# Patient Record
Sex: Male | Born: 1955 | Race: Black or African American | Hispanic: No | Marital: Single | Smoking: Current every day smoker
Health system: Southern US, Community
[De-identification: ages and names within clinical notes are randomized; demographics above are authoritative.]

## PROBLEM LIST (undated history)

## (undated) DIAGNOSIS — K219 Gastro-esophageal reflux disease without esophagitis: Secondary | ICD-10-CM

## (undated) DIAGNOSIS — N529 Male erectile dysfunction, unspecified: Secondary | ICD-10-CM

## (undated) DIAGNOSIS — G4730 Sleep apnea, unspecified: Secondary | ICD-10-CM

## (undated) DIAGNOSIS — Z8669 Personal history of other diseases of the nervous system and sense organs: Secondary | ICD-10-CM

## (undated) DIAGNOSIS — R413 Other amnesia: Secondary | ICD-10-CM

## (undated) DIAGNOSIS — B1920 Unspecified viral hepatitis C without hepatic coma: Secondary | ICD-10-CM

## (undated) DIAGNOSIS — Z720 Tobacco use: Secondary | ICD-10-CM

## (undated) DIAGNOSIS — R569 Unspecified convulsions: Secondary | ICD-10-CM

## (undated) DIAGNOSIS — R110 Nausea: Secondary | ICD-10-CM

## (undated) DIAGNOSIS — I10 Essential (primary) hypertension: Secondary | ICD-10-CM

## (undated) DIAGNOSIS — E119 Type 2 diabetes mellitus without complications: Secondary | ICD-10-CM

## (undated) DIAGNOSIS — Z87442 Personal history of urinary calculi: Secondary | ICD-10-CM

## (undated) DIAGNOSIS — I639 Cerebral infarction, unspecified: Secondary | ICD-10-CM

## (undated) DIAGNOSIS — R51 Headache: Secondary | ICD-10-CM

## (undated) HISTORY — DX: Unspecified convulsions: R56.9

## (undated) HISTORY — DX: Type 2 diabetes mellitus without complications: E11.9

## (undated) HISTORY — DX: Tobacco use: Z72.0

## (undated) HISTORY — DX: Unspecified viral hepatitis C without hepatic coma: B19.20

## (undated) HISTORY — DX: Cerebral infarction, unspecified: I63.9

## (undated) HISTORY — DX: Male erectile dysfunction, unspecified: N52.9

## (undated) HISTORY — DX: Other amnesia: R41.3

---

## 2006-10-16 ENCOUNTER — Emergency Department (HOSPITAL_COMMUNITY): Admission: EM | Admit: 2006-10-16 | Discharge: 2006-10-16 | Payer: Self-pay | Admitting: Specialist

## 2009-02-05 ENCOUNTER — Inpatient Hospital Stay (HOSPITAL_COMMUNITY): Admission: EM | Admit: 2009-02-05 | Discharge: 2009-02-13 | Payer: Self-pay | Admitting: Emergency Medicine

## 2009-02-05 ENCOUNTER — Ambulatory Visit: Payer: Self-pay | Admitting: Cardiology

## 2009-02-06 ENCOUNTER — Encounter: Payer: Self-pay | Admitting: Internal Medicine

## 2009-02-07 ENCOUNTER — Encounter (INDEPENDENT_AMBULATORY_CARE_PROVIDER_SITE_OTHER): Payer: Self-pay | Admitting: Internal Medicine

## 2009-02-12 ENCOUNTER — Ambulatory Visit: Payer: Self-pay | Admitting: Physical Medicine & Rehabilitation

## 2009-08-20 ENCOUNTER — Emergency Department (HOSPITAL_COMMUNITY): Admission: EM | Admit: 2009-08-20 | Discharge: 2009-08-20 | Payer: Self-pay | Admitting: Emergency Medicine

## 2009-08-22 ENCOUNTER — Emergency Department (HOSPITAL_COMMUNITY): Admission: EM | Admit: 2009-08-22 | Discharge: 2009-08-22 | Payer: Self-pay | Admitting: Emergency Medicine

## 2010-02-08 ENCOUNTER — Emergency Department (HOSPITAL_COMMUNITY): Admission: EM | Admit: 2010-02-08 | Discharge: 2010-02-08 | Payer: Self-pay | Admitting: Emergency Medicine

## 2010-07-22 ENCOUNTER — Ambulatory Visit: Payer: Medicaid Other | Attending: Neurology

## 2010-07-22 DIAGNOSIS — G4733 Obstructive sleep apnea (adult) (pediatric): Secondary | ICD-10-CM | POA: Insufficient documentation

## 2010-07-22 DIAGNOSIS — Z6833 Body mass index (BMI) 33.0-33.9, adult: Secondary | ICD-10-CM | POA: Insufficient documentation

## 2010-08-18 LAB — URINALYSIS, ROUTINE W REFLEX MICROSCOPIC
Bilirubin Urine: NEGATIVE
Glucose, UA: NEGATIVE mg/dL
Glucose, UA: NEGATIVE mg/dL
Ketones, ur: NEGATIVE mg/dL
Ketones, ur: NEGATIVE mg/dL
Nitrite: NEGATIVE
Protein, ur: NEGATIVE mg/dL
pH: 5.5 (ref 5.0–8.0)

## 2010-08-30 LAB — ACETAMINOPHEN LEVEL: Acetaminophen (Tylenol), Serum: <10 ug/mL — ABNORMAL LOW (ref 10–30)

## 2010-08-30 LAB — BASIC METABOLIC PANEL
BUN: 14 mg/dL (ref 6–23)
BUN: 16 mg/dL (ref 6–23)
BUN: 19 mg/dL (ref 6–23)
BUN: 15 mg/dL (ref 6–23)
BUN: 18 mg/dL (ref 6–23)
CO2: 25 mEq/L (ref 19–32)
CO2: 28 mEq/L (ref 19–32)
CO2: 22 mEq/L (ref 19–32)
CO2: 24 mEq/L (ref 19–32)
Calcium: 8.9 mg/dL (ref 8.4–10.5)
Calcium: 9 mg/dL (ref 8.4–10.5)
Calcium: 9.2 mg/dL (ref 8.4–10.5)
Calcium: 8.6 mg/dL (ref 8.4–10.5)
Calcium: 9 mg/dL (ref 8.4–10.5)
Chloride: 104 mEq/L (ref 96–112)
Chloride: 96 mEq/L (ref 96–112)
Creatinine, Ser: 1.54 mg/dL — ABNORMAL HIGH (ref 0.4–1.5)
Creatinine, Ser: 1.54 mg/dL — ABNORMAL HIGH (ref 0.4–1.5)
Creatinine, Ser: 1.63 mg/dL — ABNORMAL HIGH (ref 0.4–1.5)
Creatinine, Ser: 2.13 mg/dL — ABNORMAL HIGH (ref 0.4–1.5)
Creatinine, Ser: 2.53 mg/dL — ABNORMAL HIGH (ref 0.4–1.5)
GFR calc Af Amer: 40 mL/min — ABNORMAL LOW (ref >60)
GFR calc Af Amer: 57 mL/min — ABNORMAL LOW (ref >60)
GFR calc Af Amer: 57 mL/min — ABNORMAL LOW (ref >60)
GFR calc non Af Amer: 33 mL/min — ABNORMAL LOW (ref >60)
GFR calc non Af Amer: 44 mL/min — ABNORMAL LOW (ref >60)
GFR calc non Af Amer: 47 mL/min — ABNORMAL LOW (ref >60)
GFR calc non Af Amer: 47 mL/min — ABNORMAL LOW (ref >60)
GFR calc non Af Amer: 51 mL/min — ABNORMAL LOW (ref >60)
GFR calc non Af Amer: 27 mL/min — ABNORMAL LOW (ref >60)
Glucose, Bld: 105 mg/dL — ABNORMAL HIGH (ref 70–99)
Glucose, Bld: 109 mg/dL — ABNORMAL HIGH (ref 70–99)
Glucose, Bld: 109 mg/dL — ABNORMAL HIGH (ref 70–99)
Glucose, Bld: 111 mg/dL — ABNORMAL HIGH (ref 70–99)
Potassium: 3.6 mEq/L (ref 3.5–5.1)
Potassium: 3.6 mEq/L (ref 3.5–5.1)
Sodium: 135 mEq/L (ref 135–145)
Sodium: 136 mEq/L (ref 135–145)
Sodium: 138 mEq/L (ref 135–145)
Sodium: 137 mEq/L (ref 135–145)

## 2010-08-30 LAB — DIFFERENTIAL
Basophils Absolute: 0 10*3/uL (ref 0.0–0.1)
Basophils Relative: 1 % (ref 0–1)
Eosinophils Absolute: 0.5 10*3/uL (ref 0.0–0.7)
Monocytes Absolute: 0.5 10*3/uL (ref 0.1–1.0)
Monocytes Relative: 6 % (ref 3–12)
Neutro Abs: 5.2 10*3/uL (ref 1.7–7.7)
Neutrophils Relative %: 70 % (ref 43–77)

## 2010-08-30 LAB — CARDIAC PANEL(CRET KIN+CKTOT+MB+TROPI)
CK, MB: 2.3 ng/mL (ref 0.3–4.0)
Total CK: 295 U/L — ABNORMAL HIGH (ref 7–232)
Total CK: 370 U/L — ABNORMAL HIGH (ref 7–232)
Troponin I: 0.02 ng/mL (ref 0.00–0.06)
Troponin I: 0.03 ng/mL (ref 0.00–0.06)

## 2010-08-30 LAB — RAPID URINE DRUG SCREEN, HOSP PERFORMED: Barbiturates: NOT DETECTED

## 2010-08-30 LAB — URINALYSIS, ROUTINE W REFLEX MICROSCOPIC
Leukocytes, UA: NEGATIVE
Nitrite: NEGATIVE
Protein, ur: 30 mg/dL — AB
Specific Gravity, Urine: 1.025 (ref 1.005–1.030)
Urobilinogen, UA: 0.2 mg/dL (ref 0.0–1.0)

## 2010-08-30 LAB — CBC
HCT: 42.4 % (ref 39.0–52.0)
HCT: 44.3 % (ref 39.0–52.0)
Hemoglobin: 15.1 g/dL (ref 13.0–17.0)
MCHC: 34.1 g/dL (ref 30.0–36.0)
MCHC: 34 g/dL (ref 30.0–36.0)
MCV: 90.5 fL (ref 78.0–100.0)
Platelets: 101 10*3/uL — ABNORMAL LOW (ref 150–400)
Platelets: 109 10*3/uL — ABNORMAL LOW (ref 150–400)
Platelets: 91 10*3/uL — ABNORMAL LOW (ref 150–400)
RDW: 14.3 % (ref 11.5–15.5)
RDW: 14.5 % (ref 11.5–15.5)
RDW: 14.4 % (ref 11.5–15.5)
RDW: 14.4 % (ref 11.5–15.5)
WBC: 8 10*3/uL (ref 4.0–10.5)
WBC: 7.4 10*3/uL (ref 4.0–10.5)

## 2010-08-30 LAB — COMPREHENSIVE METABOLIC PANEL
ALT: 43 U/L (ref 0–53)
Albumin: 3.7 g/dL (ref 3.5–5.2)
Alkaline Phosphatase: 84 U/L (ref 39–117)
BUN: 13 mg/dL (ref 6–23)
Chloride: 103 mEq/L (ref 96–112)
Glucose, Bld: 177 mg/dL — ABNORMAL HIGH (ref 70–99)
Potassium: 4.4 mEq/L (ref 3.5–5.1)
Sodium: 137 mEq/L (ref 135–145)
Total Bilirubin: 0.5 mg/dL (ref 0.3–1.2)
Total Protein: 7.1 g/dL (ref 6.0–8.3)

## 2010-08-30 LAB — PHENYTOIN LEVEL, TOTAL
Phenytoin Lvl: 14.7 ug/mL (ref 10.0–20.0)
Phenytoin Lvl: 15.3 ug/mL (ref 10.0–20.0)

## 2010-08-30 LAB — CREATININE, URINE, RANDOM: Creatinine, Urine: 81.2 mg/dL

## 2010-08-30 LAB — URINE MICROSCOPIC-ADD ON

## 2010-08-30 LAB — ETHANOL: Alcohol, Ethyl (B): <5 mg/dL (ref 0–10)

## 2010-09-16 IMAGING — CR DG CHEST 2V
2 series · 2 of 2 positions shown · non-contrast
Comparison: 02/05/2009

CLINICAL DATA: Chest pain

CHEST - 2 VIEW

[view not recorded (1 of 2)]
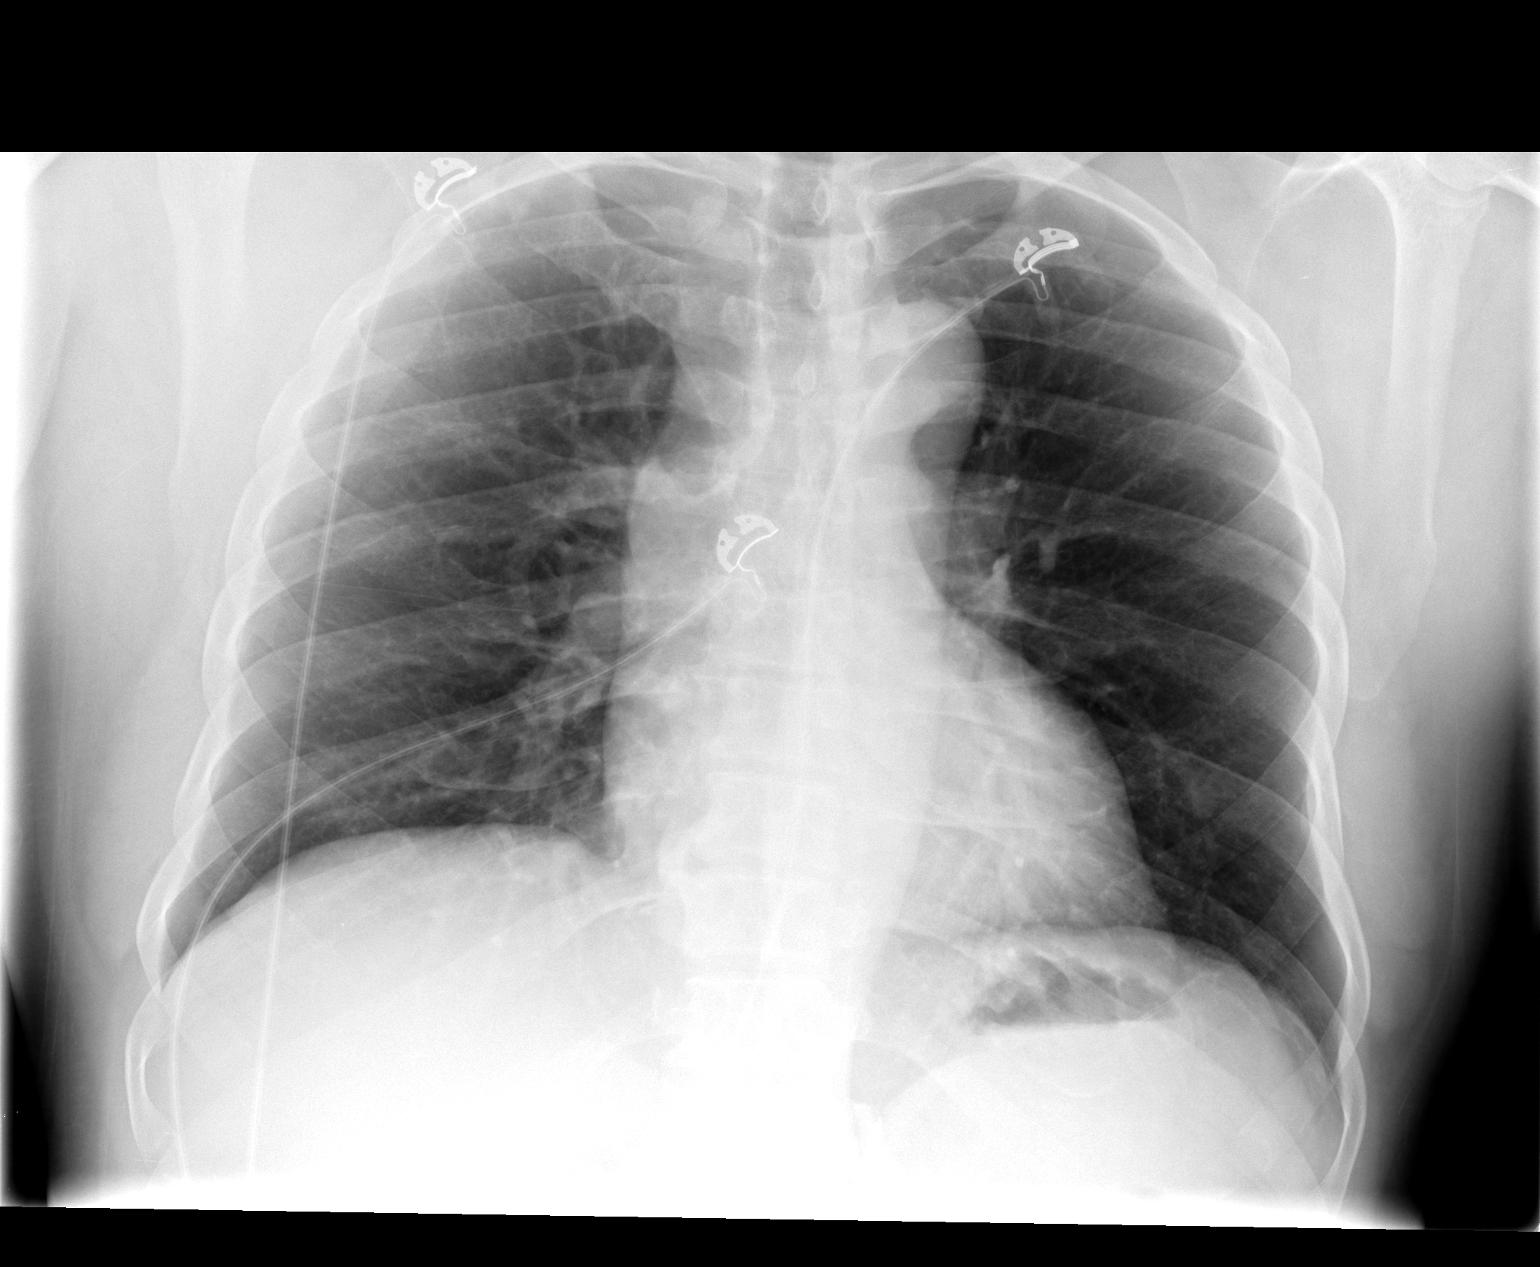

[view not recorded (2 of 2)]
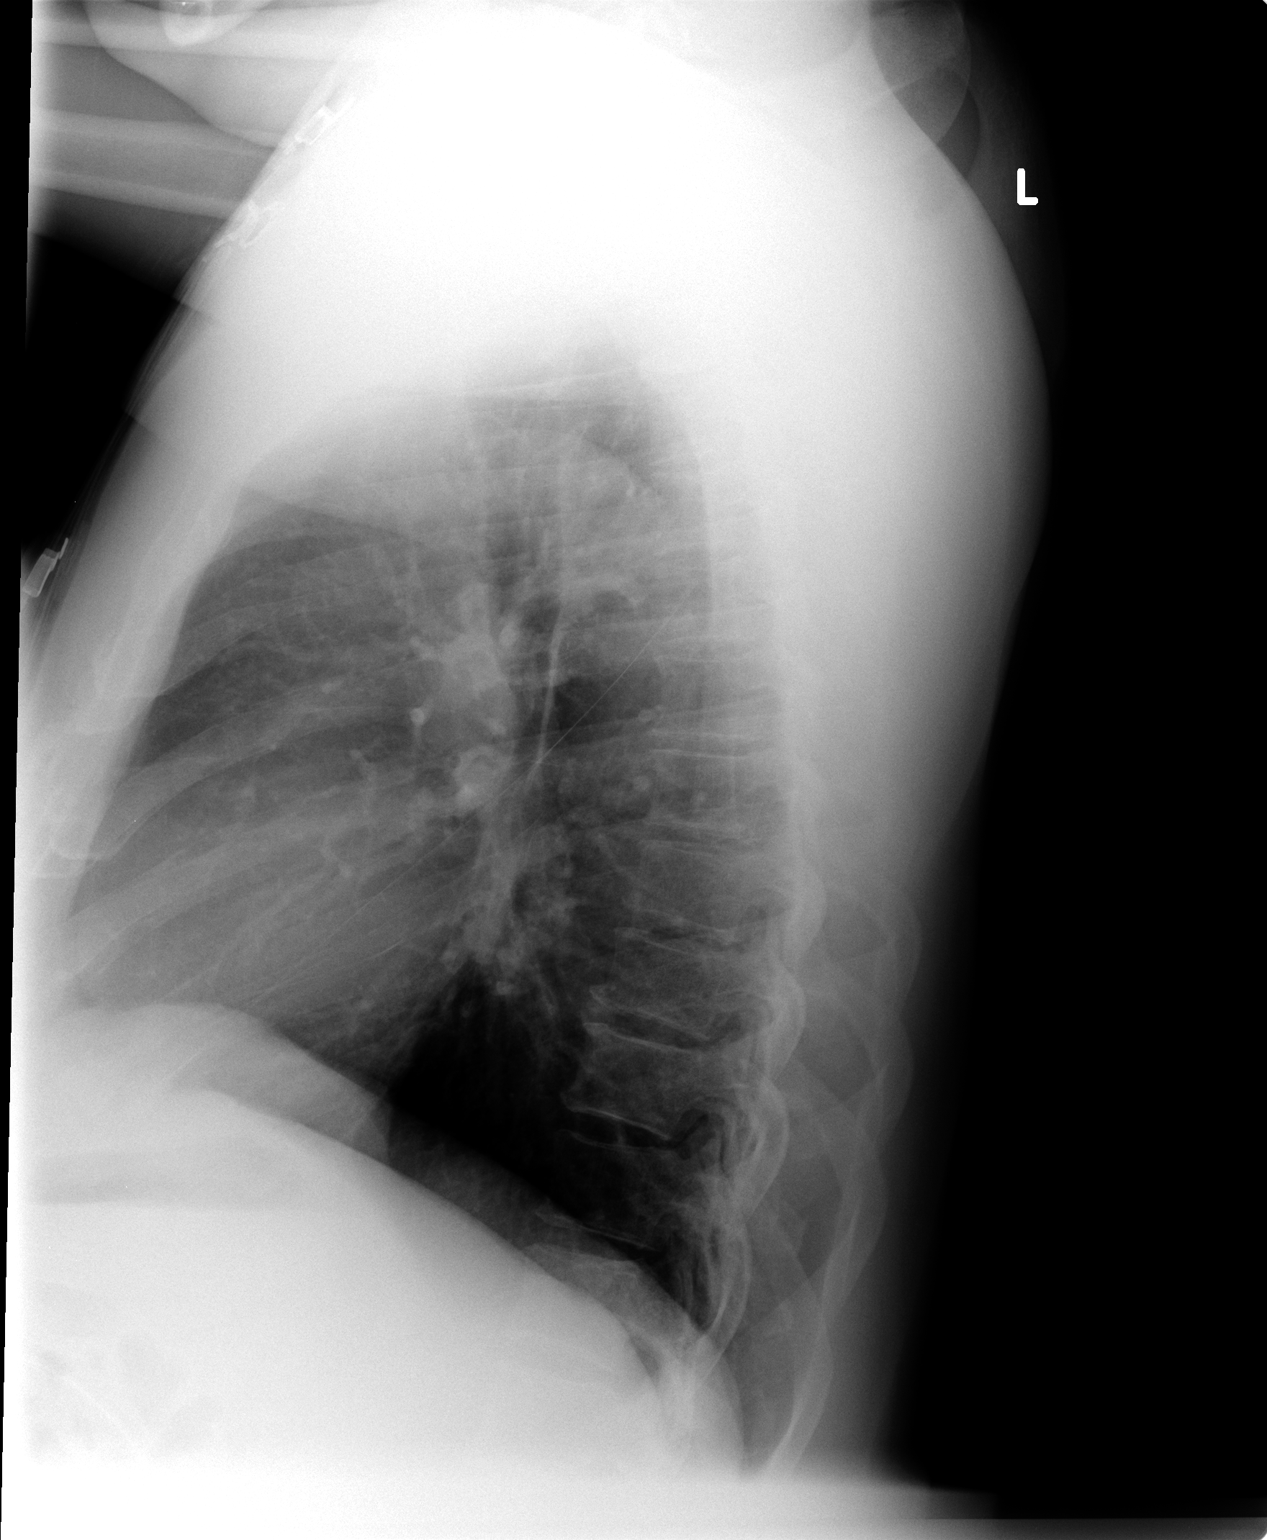

[2 of 2 positions shown; findings below may reference images not displayed]

FINDINGS: The lungs are clear bilaterally.  No confluent airspace
opacities, pleural effuions or pneumothoracies are seen.  The heart
is normal in size and contour.  The upper abdomen and osseous
structures are normal.
IMPRESSION: No acute cardiopulmonary disease.

## 2011-06-10 ENCOUNTER — Encounter (HOSPITAL_COMMUNITY): Payer: Self-pay

## 2011-06-10 ENCOUNTER — Emergency Department (HOSPITAL_COMMUNITY)
Admission: EM | Admit: 2011-06-10 | Discharge: 2011-06-10 | Disposition: A | Discharge status: Home / Self Care | Payer: Medicaid Other | Attending: Emergency Medicine | Admitting: Emergency Medicine

## 2011-06-10 ENCOUNTER — Other Ambulatory Visit: Payer: Self-pay

## 2011-06-10 ENCOUNTER — Emergency Department (HOSPITAL_COMMUNITY): Payer: Medicaid Other

## 2011-06-10 DIAGNOSIS — R10811 Right upper quadrant abdominal tenderness: Secondary | ICD-10-CM | POA: Insufficient documentation

## 2011-06-10 DIAGNOSIS — R110 Nausea: Secondary | ICD-10-CM | POA: Insufficient documentation

## 2011-06-10 DIAGNOSIS — R1011 Right upper quadrant pain: Secondary | ICD-10-CM | POA: Insufficient documentation

## 2011-06-10 DIAGNOSIS — I10 Essential (primary) hypertension: Secondary | ICD-10-CM | POA: Insufficient documentation

## 2011-06-10 DIAGNOSIS — F17200 Nicotine dependence, unspecified, uncomplicated: Secondary | ICD-10-CM | POA: Insufficient documentation

## 2011-06-10 DIAGNOSIS — R10816 Epigastric abdominal tenderness: Secondary | ICD-10-CM | POA: Insufficient documentation

## 2011-06-10 DIAGNOSIS — R1013 Epigastric pain: Secondary | ICD-10-CM | POA: Insufficient documentation

## 2011-06-10 HISTORY — DX: Essential (primary) hypertension: I10

## 2011-06-10 LAB — DIFFERENTIAL
Basophils Relative: 0 % (ref 0–1)
Eosinophils Absolute: 0.1 10*3/uL (ref 0.0–0.7)
Monocytes Absolute: 0.7 10*3/uL (ref 0.1–1.0)
Monocytes Relative: 8 % (ref 3–12)

## 2011-06-10 LAB — COMPREHENSIVE METABOLIC PANEL
Albumin: 4 g/dL (ref 3.5–5.2)
BUN: 12 mg/dL (ref 6–23)
Creatinine, Ser: 1.07 mg/dL (ref 0.50–1.35)
Total Bilirubin: 0.3 mg/dL (ref 0.3–1.2)
Total Protein: 8.3 g/dL (ref 6.0–8.3)

## 2011-06-10 LAB — URINALYSIS, ROUTINE W REFLEX MICROSCOPIC
Glucose, UA: NEGATIVE mg/dL
Leukocytes, UA: NEGATIVE
Nitrite: NEGATIVE
Protein, ur: NEGATIVE mg/dL
pH: 7 (ref 5.0–8.0)

## 2011-06-10 LAB — CBC
HCT: 45.6 % (ref 39.0–52.0)
Hemoglobin: 16.2 g/dL (ref 13.0–17.0)
MCH: 30.7 pg (ref 26.0–34.0)
MCHC: 35.5 g/dL (ref 30.0–36.0)
MCV: 86.5 fL (ref 78.0–100.0)
RBC: 5.27 MIL/uL (ref 4.22–5.81)

## 2011-06-10 LAB — LIPASE, BLOOD: Lipase: 22 U/L (ref 11–59)

## 2011-06-10 MED ORDER — METOCLOPRAMIDE HCL 5 MG/ML IJ SOLN
10.0000 mg | Freq: Once | INTRAMUSCULAR | Status: AC
  Administered 2011-06-10: 10 mg via INTRAVENOUS
  Filled 2011-06-10: qty 2

## 2011-06-10 MED ORDER — ONDANSETRON HCL 4 MG/2ML IJ SOLN
4.0000 mg | Freq: Once | INTRAMUSCULAR | Status: AC
  Administered 2011-06-10: 4 mg via INTRAVENOUS
  Filled 2011-06-10: qty 2

## 2011-06-10 MED ORDER — OXYCODONE-ACETAMINOPHEN 5-325 MG PO TABS: 1.0000 | ORAL_TABLET | ORAL | Status: AC | PRN

## 2011-06-10 MED ORDER — ONDANSETRON HCL 4 MG PO TABS: 4.0000 mg | ORAL_TABLET | Freq: Four times a day (QID) | ORAL | Status: AC

## 2011-06-10 MED ORDER — METOPROLOL SUCCINATE ER 50 MG PO TB24
50.0000 mg | ORAL_TABLET | Freq: Every day | ORAL | Status: DC
  Administered 2011-06-10: 50 mg via ORAL
  Filled 2011-06-10 (×3): qty 1

## 2011-06-10 MED ORDER — HYDROMORPHONE HCL PF 1 MG/ML IJ SOLN
1.0000 mg | Freq: Once | INTRAMUSCULAR | Status: AC
  Administered 2011-06-10: 1 mg via INTRAVENOUS
  Filled 2011-06-10: qty 1

## 2011-06-10 MED ORDER — SODIUM CHLORIDE 0.9 % IV SOLN
Freq: Once | INTRAVENOUS | Status: AC
  Administered 2011-06-10: 10:00:00 via INTRAVENOUS

## 2011-06-10 MED ORDER — HYDROMORPHONE HCL PF 1 MG/ML IJ SOLN
2.0000 mg | Freq: Once | INTRAMUSCULAR | Status: AC
  Administered 2011-06-10: 2 mg via INTRAVENOUS
  Filled 2011-06-10: qty 2

## 2011-06-10 NOTE — ED Notes (Signed)
Complain of epigastric pain that started around 0500 today. Denies n/v/d

## 2011-06-10 NOTE — ED Provider Notes (Signed)
History     CSN: [id number]  Arrival date & time 06/10/11  4098   First MD Initiated Contact with Patient 06/10/11 [phone]      Chief Complaint  Patient presents with  . Abdominal Pain    (Consider location/radiation/quality/duration/timing/severity/associated sxs/prior treatment) HPI Comments: Patient c/o sudden onset of epigastric/ RUQ pain this morning at 5:00 am.  Describes the pain as dull and constant and "makes me feel like I want to double over".  He denies nausea, vomiting, dyspnea, back pain or chest pain.  He states he felt fine at bedtime last evening but also states that he ate a cheeseburger and french fries just before going to bed.    Patient is a 56 y.o. male presenting with abdominal pain. The history is provided by the patient.  Abdominal Pain The primary symptoms of the illness include abdominal pain and nausea. The primary symptoms of the illness do not include fever, fatigue, shortness of breath, vomiting, diarrhea, hematemesis or dysuria. The current episode started 3 to 5 hours ago. The onset of the illness was sudden. The problem has not changed since onset. The abdominal pain began 3 to 5 hours ago. The pain came on suddenly. The abdominal pain has been unchanged since its onset. The abdominal pain is located in the epigastric region. The abdominal pain does not radiate. The abdominal pain is relieved by nothing. Exacerbated by: nothing.  The illness is associated with eating. The patient has not had a change in bowel habit. Symptoms associated with the illness do not include chills, diaphoresis, heartburn, constipation, urgency, hematuria, frequency or back pain. Significant associated medical issues do not include diabetes, liver disease or cardiac disease.    Past Medical History  Diagnosis Date  . Hypertension     History reviewed. No pertinent past surgical history.  History reviewed. No pertinent family history.  History  Substance Use Topics  . Smoking  status: Current Everyday Smoker  . Smokeless tobacco: Not on file  . Alcohol Use: Yes      Review of Systems  Constitutional: Positive for appetite change. Negative for fever, chills, diaphoresis, activity change and fatigue.  HENT: Negative for neck pain and neck stiffness.   Respiratory: Negative for chest tightness and shortness of breath.   Cardiovascular: Negative for chest pain and palpitations.  Gastrointestinal: Positive for nausea and abdominal pain. Negative for heartburn, vomiting, diarrhea, constipation, abdominal distention and hematemesis.  Genitourinary: Negative for dysuria, urgency, frequency, hematuria, difficulty urinating and testicular pain.  Musculoskeletal: Negative for back pain.  Skin: Negative.   All other systems reviewed and are negative.    Allergies  Review of patient's allergies indicates no known allergies.  Home Medications  No current outpatient prescriptions on file.  BP 177/89  Pulse 95  Temp 98.2 F (36.8 C)  Resp 20  Ht 5\' 7"  (1.702 m)  Wt 199 lb (90.266 kg)  BMI 31.17 kg/m2  SpO2 98%  Physical Exam  Nursing note and vitals reviewed. Constitutional: He is oriented to person, place, and time. He appears well-developed and well-nourished. No distress.  HENT:  Head: Normocephalic and atraumatic.  Mouth/Throat: Oropharynx is clear and moist.  Cardiovascular: Normal rate, regular rhythm and normal heart sounds.   Pulmonary/Chest: Effort normal and breath sounds normal. No respiratory distress. He exhibits no tenderness.  Abdominal: Soft. Normal appearance and bowel sounds are normal. He exhibits no distension and no mass. There is no hepatomegaly. There is tenderness in the right upper quadrant and epigastric area.  There is no rigidity, no rebound, no guarding, no CVA tenderness and no tenderness at McBurney's point.         Mild to moderate tenderness on exam of the epigastric and RUQ area.  No guarding or rebound tenderness    Musculoskeletal: Normal range of motion. He exhibits no edema and no tenderness.  Neurological: He is alert and oriented to person, place, and time. No cranial nerve deficit. He exhibits normal muscle tone. Coordination normal.  Skin: Skin is warm and dry.    ED Course  Procedures (including critical care time)  Results for orders placed during the hospital encounter of 06/10/11  CBC      Component Value Range   WBC 8.7  4.0 - 10.5 (K/uL)   RBC 5.27  4.22 - 5.81 (MIL/uL)   Hemoglobin 16.2  13.0 - 17.0 (g/dL)   HCT 16.1  09.6 - 04.5 (%)   MCV 86.5  78.0 - 100.0 (fL)   MCH 30.7  26.0 - 34.0 (pg)   MCHC 35.5  30.0 - 36.0 (g/dL)   RDW 40.9  81.1 - 91.4 (%)   Platelets 143 (*) 150 - 400 (K/uL)  DIFFERENTIAL      Component Value Range   Neutrophils Relative 76  43 - 77 (%)   Neutro Abs 6.6  1.7 - 7.7 (K/uL)   Lymphocytes Relative 15  12 - 46 (%)   Lymphs Abs 1.3  0.7 - 4.0 (K/uL)   Monocytes Relative 8  3 - 12 (%)   Monocytes Absolute 0.7  0.1 - 1.0 (K/uL)   Eosinophils Relative 1  0 - 5 (%)   Eosinophils Absolute 0.1  0.0 - 0.7 (K/uL)   Basophils Relative 0  0 - 1 (%)   Basophils Absolute 0.0  0.0 - 0.1 (K/uL)  COMPREHENSIVE METABOLIC PANEL      Component Value Range   Sodium 138  135 - 145 (mEq/L)   Potassium 3.3 (*) 3.5 - 5.1 (mEq/L)   Chloride 96  96 - 112 (mEq/L)   CO2 31  19 - 32 (mEq/L)   Glucose, Bld 187 (*) 70 - 99 (mg/dL)   BUN 12  6 - 23 (mg/dL)   Creatinine, Ser 7.82  0.50 - 1.35 (mg/dL)   Calcium 95.6  8.4 - 10.5 (mg/dL)   Total Protein 8.3  6.0 - 8.3 (g/dL)   Albumin 4.0  3.5 - 5.2 (g/dL)   AST 43 (*) 0 - 37 (U/L)   ALT 49  0 - 53 (U/L)   Alkaline Phosphatase 227 (*) 39 - 117 (U/L)   Total Bilirubin 0.3  0.3 - 1.2 (mg/dL)   GFR calc non Af Amer 76 (*) >90 (mL/min)   GFR calc Af Amer 88 (*) >90 (mL/min)  URINALYSIS, ROUTINE W REFLEX MICROSCOPIC      Component Value Range   Color, Urine YELLOW  YELLOW    APPearance CLEAR  CLEAR    Specific Gravity, Urine  1.015  1.005 - 1.030    pH 7.0  5.0 - 8.0    Glucose, UA NEGATIVE  NEGATIVE (mg/dL)   Hgb urine dipstick TRACE (*) NEGATIVE    Bilirubin Urine NEGATIVE  NEGATIVE    Ketones, ur NEGATIVE  NEGATIVE (mg/dL)   Protein, ur NEGATIVE  NEGATIVE (mg/dL)   Urobilinogen, UA 0.2  0.0 - 1.0 (mg/dL)   Nitrite NEGATIVE  NEGATIVE    Leukocytes, UA NEGATIVE  NEGATIVE   LIPASE, BLOOD      Component  Value Range   Lipase 22  11 - 59 (U/L)  URINE MICROSCOPIC-ADD ON      Component Value Range   RBC / HPF 0-2  <3 (RBC/hpf)     US Abdomen Complete  06/10/2011  *RADIOLOGY REPORT*  Clinical Data:  Abdominal pain.  COMPLETE ABDOMINAL ULTRASOUND  Comparison:  Renal ultrasound 02/06/2009  Findings:  Gallbladder:  There is a mobile echogenic structure within the gallbladder that is suggestive for a sludge ball.  There is a smaller echogenic structure with shadowing suggestive for a stone. Stone roughly measures 1 cm.  No evidence for gallbladder wall thickening.  Common bile duct:  Measures 0.6 cm in diameter.  Liver:  Increased echogenicity of the liver without focal lesion.  IVC:  Appears normal.  Pancreas:  Limited evaluation of pancreas due to bowel gas.  The pancreatic duct measures between 3-4 mm.  Spleen:  Spleen measures 4.9 cm in length.  Right Kidney:  Right kidney measures 11.2 cm in length without hydronephrosis.  Left Kidney:  Left kidney measures 11.1 cm in length without hydronephrosis.  Abdominal aorta:  No aneurysm identified.  IMPRESSION: Mobile echogenic structures within the gallbladder suggestive for a gallstone and sludge.  There may be increased echogenicity of the liver which could be associated with hepatic steatosis.  Original Report Authenticated By: Richarda Overlie, M.D.      MDM     Date: 06/10/2011  Rate: 70  Rhythm: normal sinus rhythm  QRS Axis: normal  Intervals: normal  ST/T Wave abnormalities: normal  Conduction Disutrbances:none  Narrative Interpretation:   Old EKG Reviewed:  unchanged  EKG read by Dr. Colon Branch      3:20 PM patient is feeling better, pain much improved.  HAs received IVF's and drank soda.  No vomiting during ed stay.  Patient remains hypertensive, but did not take his toprol this morning and states his BP "is always high".  He continues to deny any chest pain, numbness or dyspnea.  He is requesting to go home.  He appears stable, non-toxic appearing , abd remains soft, NT without peritoneal signs.  He agrees to close f/u with Dr. Caesar Bookman and I have also advised him to f/u with the health dept regarding his HTN..  Patient / Family / Caregiver understand and agree with initial ED impression and plan with expectations set for ED visit.   Pt feels improved after observation and/or treatment in ED.   Pt stable in ED with no significant deterioration in condition.   Jacek Colson L. [place], [place] 06/11/11 2036

## 2011-06-16 NOTE — ED Provider Notes (Signed)
Medical screening examination/treatment/procedure(s) were performed by non-physician practitioner and as supervising physician I was immediately available for consultation/collaboration.  Ruey Storer S. Darriana Deboy, MD 06/16/11 1019 

## 2012-08-10 ENCOUNTER — Encounter: Payer: Self-pay | Admitting: Physician Assistant

## 2012-08-10 ENCOUNTER — Ambulatory Visit (INDEPENDENT_AMBULATORY_CARE_PROVIDER_SITE_OTHER): Payer: Medicaid Other | Admitting: Physician Assistant

## 2012-08-10 VITALS — BP 153/107 | HR 71 | Temp 97.4°F | Ht 67.0 in | Wt 193.6 lb

## 2012-08-10 DIAGNOSIS — G459 Transient cerebral ischemic attack, unspecified: Secondary | ICD-10-CM

## 2012-08-10 LAB — CBC WITH DIFFERENTIAL/PLATELET
Basophils Absolute: 0 10*3/uL (ref 0.0–0.1)
Basophils Relative: 1 % (ref 0–1)
Eosinophils Absolute: 0.7 10*3/uL (ref 0.0–0.7)
Lymphs Abs: 2.3 10*3/uL (ref 0.7–4.0)
MCH: 30.7 pg (ref 26.0–34.0)
Neutrophils Relative %: 48 % (ref 43–77)
Platelets: 161 10*3/uL (ref 150–400)
RBC: 5.7 MIL/uL (ref 4.22–5.81)
RDW: 13.4 % (ref 11.5–15.5)

## 2012-08-10 LAB — COMPREHENSIVE METABOLIC PANEL
Albumin: 4.5 g/dL (ref 3.5–5.2)
BUN: 21 mg/dL (ref 6–23)
CO2: 29 mEq/L (ref 19–32)
Calcium: 10 mg/dL (ref 8.4–10.5)
Chloride: 93 mEq/L — ABNORMAL LOW (ref 96–112)
Glucose, Bld: 111 mg/dL — ABNORMAL HIGH (ref 70–99)
Potassium: 3.7 mEq/L (ref 3.5–5.3)
Total Protein: 8 g/dL (ref 6.0–8.3)

## 2012-08-10 LAB — LIPID PANEL
Cholesterol: 249 mg/dL — ABNORMAL HIGH (ref 0–200)
Total CHOL/HDL Ratio: 4.6 Ratio

## 2012-08-10 MED ORDER — ASPIRIN 81 MG PO TABS: 81.0000 mg | ORAL_TABLET | Freq: Every day | ORAL | Status: DC

## 2012-08-10 NOTE — Progress Notes (Addendum)
  Subjective:    Patient ID: Philip [name], male    DOB: 1955/11/20, 57 y.o.   MRN: [id number]  HPI Comments: Right side of head hurt badly last night, left hand got numb and tingly. Episode lasted a few minutes. The episode occurred last night, approximately 14 hours ago  Hypertension Associated symptoms include headaches. Pertinent negatives include no neck pain.      Review of Systems  HENT: Negative for drooling, neck pain and neck stiffness.   Neurological: Positive for weakness and headaches. Negative for seizures, facial asymmetry and speech difficulty.       Objective:   Physical Exam  Constitutional: He is oriented to person, place, and time.  Musculoskeletal: Normal range of motion.  Neurological: He is alert and oriented to person, place, and time.  Skin: Skin is warm and dry.  Psychiatric: He has a normal mood and affect. His behavior is normal. Judgment and thought content normal.          Assessment & Plan:  TIA HTN  Orders Placed This Encounter  Procedures  . CBC with Differential  . Lipid panel  . Comprehensive metabolic panel   Meds ordered this encounter  Medications  . aspirin 81 MG tablet    Sig: Take 1 tablet (81 mg total) by mouth daily.    Dispense:  30 tablet

## 2012-08-11 ENCOUNTER — Telehealth: Payer: Self-pay | Admitting: *Deleted

## 2012-08-11 NOTE — Telephone Encounter (Signed)
Message copied by Almeta Monas on Wed Aug 11, 2012 10:10 AM ------      Message from: Horald Pollen      Created: Wed Aug 11, 2012  8:00 AM       Hgb 17.5 should be rechecked in 2 weeks       ------

## 2012-08-11 NOTE — Progress Notes (Deleted)
  Subjective:    Patient ID: Philip [name], male    DOB: April 29, 1956, 57 y.o.   MRN: [id number]  HPI    Review of Systems     Objective:   Physical Exam  Constitutional: He appears well-developed and well-nourished.          Assessment & Plan:

## 2012-08-11 NOTE — Telephone Encounter (Signed)
Message copied by Almeta Monas on Wed Aug 11, 2012 10:07 AM ------      Message from: Horald Pollen      Created: Wed Aug 11, 2012  8:00 AM       Hgb 17.5 should be rechecked in 2 weeks       ------

## 2012-08-18 ENCOUNTER — Other Ambulatory Visit: Payer: Self-pay | Admitting: Physician Assistant

## 2012-08-18 ENCOUNTER — Other Ambulatory Visit (INDEPENDENT_AMBULATORY_CARE_PROVIDER_SITE_OTHER): Payer: Medicaid Other

## 2012-08-18 DIAGNOSIS — Z79899 Other long term (current) drug therapy: Secondary | ICD-10-CM

## 2012-08-18 DIAGNOSIS — R569 Unspecified convulsions: Secondary | ICD-10-CM

## 2012-08-20 ENCOUNTER — Telehealth: Payer: Self-pay | Admitting: Physician Assistant

## 2012-08-20 NOTE — Telephone Encounter (Signed)
Pt requesting Tramadol refills

## 2012-08-23 ENCOUNTER — Other Ambulatory Visit: Payer: Self-pay | Admitting: Physician Assistant

## 2012-08-23 ENCOUNTER — Telehealth: Payer: Self-pay | Admitting: *Deleted

## 2012-08-23 DIAGNOSIS — M549 Dorsalgia, unspecified: Secondary | ICD-10-CM

## 2012-08-23 MED ORDER — TRAMADOL HCL 50 MG PO TABS: 50.0000 mg | ORAL_TABLET | Freq: Three times a day (TID) | ORAL | Status: DC | PRN

## 2012-08-23 NOTE — Telephone Encounter (Signed)
Pt requesting refill on tramadol and call with lab results also.

## 2012-08-24 ENCOUNTER — Other Ambulatory Visit: Payer: Self-pay | Admitting: Physician Assistant

## 2012-08-24 NOTE — Telephone Encounter (Signed)
Tramadol authorized 08/23/12;  Please call lab results when they are available

## 2012-08-26 ENCOUNTER — Telehealth: Payer: Self-pay | Admitting: *Deleted

## 2012-08-26 ENCOUNTER — Other Ambulatory Visit: Payer: Medicaid Other

## 2012-08-26 ENCOUNTER — Other Ambulatory Visit: Payer: Self-pay | Admitting: Physician Assistant

## 2012-08-26 NOTE — Telephone Encounter (Signed)
Sister,[name] says they got the rx.

## 2012-08-27 NOTE — Progress Notes (Signed)
I have seen these results and believe the patient has been notified 08/27/12

## 2012-08-27 NOTE — Progress Notes (Signed)
I have seen these results and believe the patient has been notified of result 08/27/12

## 2012-08-30 ENCOUNTER — Telehealth: Payer: Self-pay | Admitting: *Deleted

## 2012-08-30 NOTE — Telephone Encounter (Signed)
Pt aware, meds done and level of dilantin (13.2),  and Topiramate (<1.5 low).

## 2012-09-16 ENCOUNTER — Ambulatory Visit (INDEPENDENT_AMBULATORY_CARE_PROVIDER_SITE_OTHER): Payer: Medicaid Other | Admitting: Family Medicine

## 2012-09-16 ENCOUNTER — Encounter: Payer: Self-pay | Admitting: Family Medicine

## 2012-09-16 VITALS — BP 124/92 | HR 72 | Temp 97.9°F | Ht 67.0 in | Wt 191.2 lb

## 2012-09-16 DIAGNOSIS — Z8669 Personal history of other diseases of the nervous system and sense organs: Secondary | ICD-10-CM

## 2012-09-16 DIAGNOSIS — E559 Vitamin D deficiency, unspecified: Secondary | ICD-10-CM

## 2012-09-16 DIAGNOSIS — I6350 Cerebral infarction due to unspecified occlusion or stenosis of unspecified cerebral artery: Secondary | ICD-10-CM

## 2012-09-16 DIAGNOSIS — R519 Headache, unspecified: Secondary | ICD-10-CM

## 2012-09-16 DIAGNOSIS — D751 Secondary polycythemia: Secondary | ICD-10-CM

## 2012-09-16 DIAGNOSIS — I639 Cerebral infarction, unspecified: Secondary | ICD-10-CM | POA: Insufficient documentation

## 2012-09-16 DIAGNOSIS — J45909 Unspecified asthma, uncomplicated: Secondary | ICD-10-CM

## 2012-09-16 DIAGNOSIS — M549 Dorsalgia, unspecified: Secondary | ICD-10-CM

## 2012-09-16 DIAGNOSIS — I10 Essential (primary) hypertension: Secondary | ICD-10-CM

## 2012-09-16 DIAGNOSIS — R51 Headache: Secondary | ICD-10-CM

## 2012-09-16 HISTORY — DX: Cerebral infarction, unspecified: I63.9

## 2012-09-16 LAB — POCT CBC
Granulocyte percent: 59.5 %G (ref 37–80)
HCT, POC: 49.6 % (ref 43.5–53.7)
Hemoglobin: 16.9 g/dL (ref 14.1–18.1)
Lymph, poc: 2.7 (ref 0.6–3.4)
MCH, POC: 30.6 pg (ref 27–31.2)
MCHC: 34.2 g/dL (ref 31.8–35.4)
MCV: 89.5 fL (ref 80–97)
MPV: 9.2 fL (ref 0–99.8)
POC Granulocyte: 4.7 (ref 2–6.9)
POC LYMPH PERCENT: 33.9 %L (ref 10–50)
Platelet Count, POC: 121 10*3/uL — AB (ref 142–424)
RBC: 5.5 M/uL (ref 4.69–6.13)
RDW, POC: 13.1 %
WBC: 7.9 10*3/uL (ref 4.6–10.2)

## 2012-09-16 MED ORDER — TRAMADOL HCL 50 MG PO TABS: 50.0000 mg | ORAL_TABLET | Freq: Three times a day (TID) | ORAL | Status: DC | PRN

## 2012-09-16 NOTE — Patient Instructions (Addendum)
Dr Woodroe Mode Recommendations  Diet and Exercise discussed with patient.  For nutrition information, I recommend books:  1).Eat to Live by Dr Monico Hoar. 2).Prevent and Reverse Heart Disease by Dr Suzzette Righter.  Exercise recommendations are:  If unable to walk, then the patient can exercise in a chair 3 times a day. By flapping arms like a bird gently and raising legs outwards to the front.  If ambulatory, the patient can go for walks for 30 minutes 3 times a week. Then increase the intensity and duration as tolerated.  Goal is to try to attain exercise frequency to 5 times a week.  If applicable: Best to perform resistance exercises (machines or weights) 2 days a week and cardio type exercises 3 days per week. Hypertriglyceridemia  Diet for High blood levels of Triglycerides Most fats in food are triglycerides. Triglycerides in your blood are stored as fat in your body. High levels of triglycerides in your blood may put you at a greater risk for heart disease and stroke.  Normal triglyceride levels are less than 150 mg/dL. Borderline high levels are 150-199 mg/dl. High levels are 200 - 499 mg/dL, and very high triglyceride levels are greater than 500 mg/dL. The decision to treat high triglycerides is generally based on the level. For people with borderline or high triglyceride levels, treatment includes weight loss and exercise. Drugs are recommended for people with very high triglyceride levels. Many people who need treatment for high triglyceride levels have metabolic syndrome. This syndrome is a collection of disorders that often include: insulin resistance, high blood pressure, blood clotting problems, high cholesterol and triglycerides. TESTING PROCEDURE FOR TRIGLYCERIDES  You should not eat 4 hours before getting your triglycerides measured. The normal range of triglycerides is between 10 and 250 milligrams per deciliter (mg/dl). Some people may have extreme levels  (1000 or above), but your triglyceride level may be too high if it is above 150 mg/dl, depending on what other risk factors you have for heart disease.  People with high blood triglycerides may also have high blood cholesterol levels. If you have high blood cholesterol as well as high blood triglycerides, your risk for heart disease is probably greater than if you only had high triglycerides. High blood cholesterol is one of the main risk factors for heart disease. CHANGING YOUR DIET  Your weight can affect your blood triglyceride level. If you are more than 20% above your ideal body weight, you may be able to lower your blood triglycerides by losing weight. Eating less and exercising regularly is the best way to combat this. Fat provides more calories than any other food. The best way to lose weight is to eat less fat. Only 30% of your total calories should come from fat. Less than 7% of your diet should come from saturated fat. A diet low in fat and saturated fat is the same as a diet to decrease blood cholesterol. By eating a diet lower in fat, you may lose weight, lower your blood cholesterol, and lower your blood triglyceride level.  Eating a diet low in fat, especially saturated fat, may also help you lower your blood triglyceride level. Ask your dietitian to help you figure how much fat you can eat based on the number of calories your caregiver has prescribed for you.  Exercise, in addition to helping with weight loss may also help lower triglyceride levels.   Alcohol can increase blood triglycerides. You may need to stop drinking alcoholic beverages.  Too much carbohydrate in your diet may also increase your blood triglycerides. Some complex carbohydrates are necessary in your diet. These may include bread, rice, potatoes, other starchy vegetables and cereals.  Reduce "simple" carbohydrates. These may include pure sugars, candy, honey, and jelly without losing other nutrients. If you have the  kind of high blood triglycerides that is affected by the amount of carbohydrates in your diet, you will need to eat less sugar and less high-sugar foods. Your caregiver can help you with this.  Adding 2-4 grams of fish oil (EPA+ DHA) may also help lower triglycerides. Speak with your caregiver before adding any supplements to your regimen. Following the Diet  Maintain your ideal weight. Your caregivers can help you with a diet. Generally, eating less food and getting more exercise will help you lose weight. Joining a weight control group may also help. Ask your caregivers for a good weight control group in your area.  Eat low-fat foods instead of high-fat foods. This can help you lose weight too.  These foods are lower in fat. Eat MORE of these:   Dried beans, peas, and lentils.  Egg whites.  Low-fat cottage cheese.  Fish.  Lean cuts of meat, such as round, sirloin, rump, and flank (cut extra fat off meat you fix).  Whole grain breads, cereals and pasta.  Skim and nonfat dry milk.  Low-fat yogurt.  Poultry without the skin.  Cheese made with skim or part-skim milk, such as mozzarella, parmesan, farmers', ricotta, or pot cheese. These are higher fat foods. Eat LESS of these:   Whole milk and foods made from whole milk, such as American, blue, cheddar, monterey jack, and swiss cheese  High-fat meats, such as luncheon meats, sausages, knockwurst, bratwurst, hot dogs, ribs, corned beef, ground pork, and regular ground beef.  Fried foods. Limit saturated fats in your diet. Substituting unsaturated fat for saturated fat may decrease your blood triglyceride level. You will need to read package labels to know which products contain saturated fats.  These foods are high in saturated fat. Eat LESS of these:   Fried pork skins.  Whole milk.  Skin and fat from poultry.  Palm oil.  Butter.  Shortening.  Cream cheese.  Tomasa Blase.  Margarines and baked goods made from listed  oils.  Vegetable shortenings.  Chitterlings.  Fat from meats.  Coconut oil.  Palm kernel oil.  Lard.  Cream.  Sour cream.  Fatback.  Coffee whiteners and non-dairy creamers made with these oils.  Cheese made from whole milk. Use unsaturated fats (both polyunsaturated and monounsaturated) moderately. Remember, even though unsaturated fats are better than saturated fats; you still want a diet low in total fat.  These foods are high in unsaturated fat:   Canola oil.  Sunflower oil.  Mayonnaise.  Almonds.  Peanuts.  Pine nuts.  Margarines made with these oils.  Safflower oil.  Olive oil.  Avocados.  Cashews.  Peanut butter.  Sunflower seeds.  Soybean oil.  Peanut oil.  Olives.  Pecans.  Walnuts.  Pumpkin seeds. Avoid sugar and other high-sugar foods. This will decrease carbohydrates without decreasing other nutrients. Sugar in your food goes rapidly to your blood. When there is excess sugar in your blood, your liver may use it to make more triglycerides. Sugar also contains calories without other important nutrients.  Eat LESS of these:   Sugar, brown sugar, powdered sugar, jam, jelly, preserves, honey, syrup, molasses, pies, candy, cakes, cookies, frosting, pastries, colas, soft drinks, punches, fruit drinks,  and regular gelatin.  Avoid alcohol. Alcohol, even more than sugar, may increase blood triglycerides. In addition, alcohol is high in calories and low in nutrients. Ask for sparkling water, or a diet soft drink instead of an alcoholic beverage. Suggestions for planning and preparing meals   Bake, broil, grill or roast meats instead of frying.  Remove fat from meats and skin from poultry before cooking.  Add spices, herbs, lemon juice or vinegar to vegetables instead of salt, rich sauces or gravies.  Use a non-stick skillet without fat or use no-stick sprays.  Cool and refrigerate stews and broth. Then remove the hardened fat floating on  the surface before serving.  Refrigerate meat drippings and skim off fat to make low-fat gravies.  Serve more fish.  Use less butter, margarine and other high-fat spreads on bread or vegetables.  Use skim or reconstituted non-fat dry milk for cooking.  Cook with low-fat cheeses.  Substitute low-fat yogurt or cottage cheese for all or part of the sour cream in recipes for sauces, dips or congealed salads.  Use half yogurt/half mayonnaise in salad recipes.  Substitute evaporated skim milk for cream. Evaporated skim milk or reconstituted non-fat dry milk can be whipped and substituted for whipped cream in certain recipes.  Choose fresh fruits for dessert instead of high-fat foods such as pies or cakes. Fruits are naturally low in fat. When Dining Out   Order low-fat appetizers such as fruit or vegetable juice, pasta with vegetables or tomato sauce.  Select clear, rather than cream soups.  Ask that dressings and gravies be served on the side. Then use less of them.  Order foods that are baked, broiled, poached, steamed, stir-fried, or roasted.  Ask for margarine instead of butter, and use only a small amount.  Drink sparkling water, unsweetened tea or coffee, or diet soft drinks instead of alcohol or other sweet beverages. QUESTIONS AND ANSWERS ABOUT OTHER FATS IN THE BLOOD: SATURATED FAT, TRANS FAT, AND CHOLESTEROL What is trans fat? Trans fat is a type of fat that is formed when vegetable oil is hardened through a process called hydrogenation. This process helps makes foods more solid, gives them shape, and prolongs their shelf life. Trans fats are also called hydrogenated or partially hydrogenated oils.  What do saturated fat, trans fat, and cholesterol in foods have to do with heart disease? Saturated fat, trans fat, and cholesterol in the diet all raise the level of LDL "bad" cholesterol in the blood. The higher the LDL cholesterol, the greater the risk for coronary heart disease  (CHD). Saturated fat and trans fat raise LDL similarly.  What foods contain saturated fat, trans fat, and cholesterol? High amounts of saturated fat are found in animal products, such as fatty cuts of meat, chicken skin, and full-fat dairy products like butter, whole milk, cream, and cheese, and in tropical vegetable oils such as palm, palm kernel, and coconut oil. Trans fat is found in some of the same foods as saturated fat, such as vegetable shortening, some margarines (especially hard or stick margarine), crackers, cookies, baked goods, fried foods, salad dressings, and other processed foods made with partially hydrogenated vegetable oils. Small amounts of trans fat also occur naturally in some animal products, such as milk products, beef, and lamb. Foods high in cholesterol include liver, other organ meats, egg yolks, shrimp, and full-fat dairy products. How can I use the new food label to make heart-healthy food choices? Check the Nutrition Facts panel of the food label. Choose  foods lower in saturated fat, trans fat, and cholesterol. For saturated fat and cholesterol, you can also use the Percent Daily Value (%DV): 5% DV or less is low, and 20% DV or more is high. (There is no %DV for trans fat.) Use the Nutrition Facts panel to choose foods low in saturated fat and cholesterol, and if the trans fat is not listed, read the ingredients and limit products that list shortening or hydrogenated or partially hydrogenated vegetable oil, which tend to be high in trans fat. POINTS TO REMEMBER:   Discuss your risk for heart disease with your caregivers, and take steps to reduce risk factors.  Change your diet. Choose foods that are low in saturated fat, trans fat, and cholesterol.  Add exercise to your daily routine if it is not already being done. Participate in physical activity of moderate intensity, like brisk walking, for at least 30 minutes on most, and preferably all days of the week. No time? Break  the 30 minutes into three, 10-minute segments during the day.  Stop smoking. If you do smoke, contact your caregiver to discuss ways in which they can help you quit.  Do not use street drugs.  Maintain a normal weight.  Maintain a healthy blood pressure.  Keep up with your blood work for checking the fats in your blood as directed by your caregiver. Document Released: 02/28/2004 Document Revised: 11/11/2011 Document Reviewed: 09/25/2008 [facility] Patient Information 2013 [place], [place].  Hypertension As your heart beats, it forces blood through your arteries. This force is your blood pressure. If the pressure is too high, it is called hypertension (HTN) or high blood pressure. HTN is dangerous because you may have it and not know it. High blood pressure may mean that your heart has to work harder to pump blood. Your arteries may be narrow or stiff. The extra work puts you at risk for heart disease, stroke, and other problems.  Blood pressure consists of two numbers, a higher number over a lower, 110/72, for example. It is stated as "110 over 72." The ideal is below 120 for the top number (systolic) and under 80 for the bottom (diastolic). Write down your blood pressure today. You should pay close attention to your blood pressure if you have certain conditions such as:  Heart failure.  Prior heart attack.  Diabetes  Chronic kidney disease.  Prior stroke.  Multiple risk factors for heart disease. To see if you have HTN, your blood pressure should be measured while you are seated with your arm held at the level of the heart. It should be measured at least twice. A one-time elevated blood pressure reading (especially in the Emergency Department) does not mean that you need treatment. There may be conditions in which the blood pressure is different between your right and left arms. It is important to see your caregiver soon for a recheck. Most people have essential hypertension which means  that there is not a specific cause. This type of high blood pressure may be lowered by changing lifestyle factors such as:  Stress.  Smoking.  Lack of exercise.  Excessive weight.  Drug/tobacco/alcohol use.  Eating less salt. Most people do not have symptoms from high blood pressure until it has caused damage to the body. Effective treatment can often prevent, delay or reduce that damage. TREATMENT  When a cause has been identified, treatment for high blood pressure is directed at the cause. There are a large number of medications to treat HTN. These fall  into several categories, and your caregiver will help you select the medicines that are best for you. Medications may have side effects. You should review side effects with your caregiver. If your blood pressure stays high after you have made lifestyle changes or started on medicines,   Your medication(s) may need to be changed.  Other problems may need to be addressed.  Be certain you understand your prescriptions, and know how and when to take your medicine.  Be sure to follow up with your caregiver within the time frame advised (usually within two weeks) to have your blood pressure rechecked and to review your medications.  If you are taking more than one medicine to lower your blood pressure, make sure you know how and at what times they should be taken. Taking two medicines at the same time can result in blood pressure that is too low. SEEK IMMEDIATE MEDICAL CARE IF:  You develop a severe headache, blurred or changing vision, or confusion.  You have unusual weakness or numbness, or a faint feeling.  You have severe chest or abdominal pain, vomiting, or breathing problems. MAKE SURE YOU:   Understand these instructions.  Will watch your condition.  Will get help right away if you are not doing well or get worse. Document Released: 05/12/2005 Document Revised: 08/04/2011 Document Reviewed: 12/31/2007 [facility] Patient  Information 2013 [place], [place].  Smoking Cessation Quitting smoking is important to your health and has many advantages. However, it is not always easy to quit since nicotine is a very addictive drug. Often times, people try 3 times or more before being able to quit. This document explains the best ways for you to prepare to quit smoking. Quitting takes hard work and a lot of effort, but you can do it. ADVANTAGES OF QUITTING SMOKING  You will live longer, feel better, and live better.  Your body will feel the impact of quitting smoking almost immediately.  Within 20 minutes, blood pressure decreases. Your pulse returns to its normal level.  After 8 hours, carbon monoxide levels in the blood return to normal. Your oxygen level increases.  After 24 hours, the chance of having a heart attack starts to decrease. Your breath, hair, and body stop smelling like smoke.  After 48 hours, damaged nerve endings begin to recover. Your sense of taste and smell improve.  After 72 hours, the body is virtually free of nicotine. Your bronchial tubes relax and breathing becomes easier.  After 2 to 12 weeks, lungs can hold more air. Exercise becomes easier and circulation improves.  The risk of having a heart attack, stroke, cancer, or lung disease is greatly reduced.  After 1 year, the risk of coronary heart disease is cut in half.  After 5 years, the risk of stroke falls to the same as a nonsmoker.  After 10 years, the risk of lung cancer is cut in half and the risk of other cancers decreases significantly.  After 15 years, the risk of coronary heart disease drops, usually to the level of a nonsmoker.  If you are pregnant, quitting smoking will improve your chances of having a healthy baby.  The people you live with, especially any children, will be healthier.  You will have extra money to spend on things other than cigarettes. QUESTIONS TO THINK ABOUT BEFORE ATTEMPTING TO QUIT You may want to  talk about your answers with your caregiver.  Why do you want to quit?  If you tried to quit in the past, what helped  and what did not?  What will be the most difficult situations for you after you quit? How will you plan to handle them?  Who can help you through the tough times? Your family? Friends? A caregiver?  What pleasures do you get from smoking? What ways can you still get pleasure if you quit? Here are some questions to ask your caregiver:  How can you help me to be successful at quitting?  What medicine do you think would be best for me and how should I take it?  What should I do if I need more help?  What is smoking withdrawal like? How can I get information on withdrawal? GET READY  Set a quit date.  Change your environment by getting rid of all cigarettes, ashtrays, matches, and lighters in your home, car, or work. Do not let people smoke in your home.  Review your past attempts to quit. Think about what worked and what did not. GET SUPPORT AND ENCOURAGEMENT You have a better chance of being successful if you have help. You can get support in many ways.  Tell your family, friends, and co-workers that you are going to quit and need their support. Ask them not to smoke around you.  Get individual, group, or telephone counseling and support. Programs are available at [employer] and health centers. Call your local health department for information about programs in your area.  Spiritual beliefs and practices may help some smokers quit.  Download a "quit meter" on your computer to keep track of quit statistics, such as how long you have gone without smoking, cigarettes not smoked, and money saved.  Get a self-help book about quitting smoking and staying off of tobacco. LEARN NEW SKILLS AND BEHAVIORS  Distract yourself from urges to smoke. Talk to someone, go for a walk, or occupy your time with a task.  Change your normal routine. Take a different route to  work. Drink tea instead of coffee. Eat breakfast in a different place.  Reduce your stress. Take a hot bath, exercise, or read a book.  Plan something enjoyable to do every day. Reward yourself for not smoking.  Explore interactive web-based programs that specialize in helping you quit. GET MEDICINE AND USE IT CORRECTLY Medicines can help you stop smoking and decrease the urge to smoke. Combining medicine with the above behavioral methods and support can greatly increase your chances of successfully quitting smoking.  Nicotine replacement therapy helps deliver nicotine to your body without the negative effects and risks of smoking. Nicotine replacement therapy includes nicotine gum, lozenges, inhalers, nasal sprays, and skin patches. Some may be available over-the-counter and others require a prescription.  Antidepressant medicine helps people abstain from smoking, but how this works is unknown. This medicine is available by prescription.  Nicotinic receptor partial agonist medicine simulates the effect of nicotine in your brain. This medicine is available by prescription. Ask your caregiver for advice about which medicines to use and how to use them based on your health history. Your caregiver will tell you what side effects to look out for if you choose to be on a medicine or therapy. Carefully read the information on the package. Do not use any other product containing nicotine while using a nicotine replacement product.  RELAPSE OR DIFFICULT SITUATIONS Most relapses occur within the first 3 months after quitting. Do not be discouraged if you start smoking again. Remember, most people try several times before finally quitting. You may have symptoms of withdrawal because  your body is used to nicotine. You may crave cigarettes, be irritable, feel very hungry, cough often, get headaches, or have difficulty concentrating. The withdrawal symptoms are only temporary. They are strongest when you first  quit, but they will go away within 10 14 days. To reduce the chances of relapse, try to:  Avoid drinking alcohol. Drinking lowers your chances of successfully quitting.  Reduce the amount of caffeine you consume. Once you quit smoking, the amount of caffeine in your body increases and can give you symptoms, such as a rapid heartbeat, sweating, and anxiety.  Avoid smokers because they can make you want to smoke.  Do not let weight gain distract you. Many smokers will gain weight when they quit, usually less than 10 pounds. Eat a healthy diet and stay active. You can always lose the weight gained after you quit.  Find ways to improve your mood other than smoking. FOR MORE INFORMATION  www.smokefree.gov  Document Released: 05/06/2001 Document Revised: 11/11/2011 Document Reviewed: 08/21/2011 [facility] Patient Information 2013 [place], [place].

## 2012-09-16 NOTE — Progress Notes (Signed)
Patient ID: Philip [name], male   DOB: 01/19/1956, 57 y.o.   MRN: [id number] SUBJECTIVE: HPI: Patient is here for follow up of hypertension: denies Headache;deniesChest Pain;denies weakness;denies Shortness of Breath or Orthopnea;denies Visual changes;denies palpitations;denies cough;denies pedal edema;admits to symptoms of TIA or stroke; had a stroke with minimal neurologis  Deficit. admits to Compliance with medications. denies Problems with medications.  Needs meds refilled. Has to see the Neurologist in [place] as planned.   PMH/PSH: reviewed/updated in Epic  SH/FH: reviewed/updated in Epic  Allergies: reviewed/updated in Epic  Medications: reviewed/updated in Epic  Immunizations: reviewed/updated in Epic  ROS: As above in the HPI. All other systems are stable or negative.  OBJECTIVE: APPEARANCE: African American Male Patient in no acute distress.The patient appeared well nourished and normally developed. Acyanotic. Waist:obese VITAL SIGNS:BP 124/92  Pulse 72  Temp(Src) 97.9 F (36.6 C) (Oral)  Ht 5\' 7"  (1.702 m)  Wt 191 lb 3.2 oz (86.728 kg)  BMI 29.94 kg/m2   SKIN: warm and  Dry without overt rashes, tattoos and scars  HEAD and Neck: without JVD, Head and scalp: normal Eyes:No scleral icterus. Fundi normal, eye movements normal. Ears: Auricle normal, canal normal, Tympanic membranes normal, insufflation normal. Nose: normal Throat: normal Neck & thyroid: normal  CHEST & LUNGS: Chest wall: normal Lungs: Clear  CVS: Reveals the PMI to be normally located. Regular rhythm, First and Second Heart sounds are normal,  absence of murmurs, rubs or gallops. Peripheral vasculature: Radial pulses: normal Dorsal pedis pulses: normal Posterior pulses: normal  ABDOMEN:  Appearance: normal Benign,, no organomegaly, no masses, no Abdominal Aortic enlargement. No Guarding , no rebound. No Bruits. Bowel sounds: normal  RECTAL: N/A GU: N/A  EXTREMETIES:  nonedematous. Both Femoral and Pedal pulses are normal.  MUSCULOSKELETAL:  Spine: normal Joints: intact  NEUROLOGIC: oriented to time,place and person; nonfocal. Strength is normal  ASSESSMENT: HTN (hypertension)  CVA (cerebral vascular accident)  History of seizure disorder  Unspecified asthma  Backache - Plan: traMADol (ULTRAM) 50 MG tablet  Generalized headaches  Unspecified vitamin D deficiency - Plan: Vitamin D 25 hydroxy  Polycythemia, secondary - Plan: POCT CBC    PLAN: Orders Placed This Encounter  Procedures  . Vitamin D 25 hydroxy  . POCT CBC   Meds ordered this encounter  Medications  . traMADol (ULTRAM) 50 MG tablet    Sig: Take 1 tablet (50 mg total) by mouth every 8 (eight) hours as needed for pain.    Dispense:  30 tablet    Refill:  0    Order Specific Question:  Supervising Provider    Answer:  Ernestina Penna [[id number]]        Dr Woodroe Mode Recommendations  Diet and Exercise discussed with patient.  For nutrition information, I recommend books:  1).Eat to Live by Dr Monico Hoar. 2).Prevent and Reverse Heart Disease by Dr Suzzette Righter.  Exercise recommendations are:  If unable to walk, then the patient can exercise in a chair 3 times a day. By flapping arms like a bird gently and raising legs outwards to the front.  If ambulatory, the patient can go for walks for 30 minutes 3 times a week. Then increase the intensity and duration as tolerated.  Goal is to try to attain exercise frequency to 5 times a week.  If applicable: Best to perform resistance exercises (machines or weights) 2 days a week and cardio type exercises 3 days per week.  Smoking cessation.  RTC 3 months. Thelma Barge  Darylene Price, M.D.

## 2012-09-17 ENCOUNTER — Other Ambulatory Visit: Payer: Self-pay | Admitting: Family Medicine

## 2012-09-17 DIAGNOSIS — E559 Vitamin D deficiency, unspecified: Secondary | ICD-10-CM

## 2012-09-17 LAB — VITAMIN D 25 HYDROXY (VIT D DEFICIENCY, FRACTURES): Vit D, 25-Hydroxy: <10 ng/mL — ABNORMAL LOW (ref 30–89)

## 2012-09-17 MED ORDER — CHOLECALCIFEROL 1.25 MG (50000 UT) PO TABS: 50000.0000 [IU] | ORAL_TABLET | ORAL | Status: DC

## 2012-09-17 NOTE — Progress Notes (Signed)
Quick Note:  Labs abnormal. Vit D too low Rx ordered in Epic CBC is normal ______

## 2012-10-21 ENCOUNTER — Other Ambulatory Visit: Payer: Self-pay | Admitting: Neurology

## 2012-10-21 DIAGNOSIS — G4730 Sleep apnea, unspecified: Secondary | ICD-10-CM

## 2012-10-27 ENCOUNTER — Ambulatory Visit: Payer: Medicaid Other | Attending: Neurology | Admitting: Sleep Medicine

## 2012-10-27 VITALS — Ht 67.0 in | Wt 199.0 lb

## 2012-10-27 DIAGNOSIS — G4733 Obstructive sleep apnea (adult) (pediatric): Secondary | ICD-10-CM | POA: Insufficient documentation

## 2012-10-27 DIAGNOSIS — Z6831 Body mass index (BMI) 31.0-31.9, adult: Secondary | ICD-10-CM | POA: Insufficient documentation

## 2012-10-27 DIAGNOSIS — G4730 Sleep apnea, unspecified: Secondary | ICD-10-CM

## 2012-10-29 NOTE — Procedures (Signed)
[facility] Tannen Vandezande A. Gerilyn Pilgrim, MD     [email or web address]        NAMECAMERON, Philip [name]                  ACCOUNT NO.:  [id number]  MEDICAL RECORD NO.:  [id number]          PATIENT TYPE:  OUT  LOCATION:  SLEEP LAB                     FACILITY:  [facility]  PHYSICIAN:  Ladoris Lythgoe A. Gerilyn Pilgrim, M.D. DATE OF BIRTH:  December 05, 1955  DATE OF STUDY:  10/27/2012                           NOCTURNAL POLYSOMNOGRAM  REFERRING PHYSICIAN:  Henrik Orihuela A. Gerilyn Pilgrim, M.D.  INDICATIONS:  A 57 year old man, who presents with history of obstructive sleep apnea syndrome probably diagnosis a couple years ago. He has hypersomnia, fatigue, snoring, and witnessed apnea.  MEDICATIONS:  Clonidine, metoprolol, furosemide, potassium, amlodipine, phenytoin, Maxzide, and Topamax.  EPWORTH SLEEPINESS SCALE:  10.  BMI:  31.  ARCHITECTURAL SUMMARY:  This is a split night recording with initial portion being a diagnostic and second portion a titration recording. The total recording time is 411 minutes.  Sleep efficiency 63%.  Sleep latency 4 minutes.  REM latency 194 minutes.  RESPIRATORY SUMMARY:  Baseline oxygen saturation is 93, lowest saturation 86 during non-REM sleep.  Diagnostic AHI is 46.  The patient was placed on positive pressure between 6 and 17.  The patient did well on pressures of 16 and 17, with resolution of events and good tolerance.  LIMB MOVEMENT SUMMARY:  PLM index 0.  ELECTROCARDIOGRAM SUMMARY:  Average heart rate is 69, with no significant dysrhythmias observed.  IMPRESSION:  Severe obstructive sleep apnea syndrome, which responds well to CPAP of 16 and 17.  I recommend the lowest effective pressure of 16.    Rudine Rieger A. Gerilyn Pilgrim, M.D.    KAD/MEDQ  D:  10/29/2012 08:51:18  T:  10/29/2012 09:21:43  Job:  [id number]

## 2012-11-03 ENCOUNTER — Encounter: Payer: Self-pay | Admitting: Family Medicine

## 2012-11-03 ENCOUNTER — Other Ambulatory Visit: Payer: Self-pay | Admitting: Family Medicine

## 2012-11-03 ENCOUNTER — Telehealth: Payer: Self-pay | Admitting: Family Medicine

## 2012-11-03 DIAGNOSIS — G4733 Obstructive sleep apnea (adult) (pediatric): Secondary | ICD-10-CM | POA: Insufficient documentation

## 2012-11-03 NOTE — Telephone Encounter (Signed)
Patient informed about his sleep study with severe OSA. Ordered CPAP in EPIc.  Ayva Veilleux P. Modesto Charon, M.D.

## 2012-11-20 ENCOUNTER — Other Ambulatory Visit: Payer: Self-pay | Admitting: Physician Assistant

## 2012-11-23 ENCOUNTER — Other Ambulatory Visit: Payer: Self-pay

## 2012-11-23 MED ORDER — FUROSEMIDE 40 MG PO TABS: 40.0000 mg | ORAL_TABLET | Freq: Every day | ORAL | Status: DC

## 2012-12-13 ENCOUNTER — Ambulatory Visit (INDEPENDENT_AMBULATORY_CARE_PROVIDER_SITE_OTHER): Payer: Medicaid Other | Admitting: Family Medicine

## 2012-12-13 ENCOUNTER — Encounter: Payer: Self-pay | Admitting: Family Medicine

## 2012-12-13 VITALS — BP 130/100 | HR 68 | Temp 98.2°F | Ht 66.5 in | Wt 187.0 lb

## 2012-12-13 DIAGNOSIS — E559 Vitamin D deficiency, unspecified: Secondary | ICD-10-CM

## 2012-12-13 DIAGNOSIS — I10 Essential (primary) hypertension: Secondary | ICD-10-CM

## 2012-12-13 DIAGNOSIS — E785 Hyperlipidemia, unspecified: Secondary | ICD-10-CM | POA: Insufficient documentation

## 2012-12-13 DIAGNOSIS — I6350 Cerebral infarction due to unspecified occlusion or stenosis of unspecified cerebral artery: Secondary | ICD-10-CM

## 2012-12-13 DIAGNOSIS — I639 Cerebral infarction, unspecified: Secondary | ICD-10-CM

## 2012-12-13 DIAGNOSIS — Z8669 Personal history of other diseases of the nervous system and sense organs: Secondary | ICD-10-CM

## 2012-12-13 LAB — POCT CBC
Granulocyte percent: 62.7 %G (ref 37–80)
Lymph, poc: 2.1 (ref 0.6–3.4)
MPV: 9.9 fL (ref 0–99.8)
POC Granulocyte: 4.1 (ref 2–6.9)
POC LYMPH PERCENT: 32.4 %L (ref 10–50)
Platelet Count, POC: 105 10*3/uL — AB (ref 142–424)
RBC: 5.5 M/uL (ref 4.69–6.13)
RDW, POC: 13 %
WBC: 6.6 10*3/uL (ref 4.6–10.2)

## 2012-12-13 NOTE — Addendum Note (Signed)
Addended by: Prescott Gum on: 12/13/2012 12:57 PM   Modules accepted: Orders

## 2012-12-13 NOTE — Progress Notes (Signed)
  Subjective:    Patient ID: Philip [name], male    DOB: April 15, 1956, 57 y.o.   MRN: [id number]  HPI This is a first-time visit for me to see this patient. He has a history of hypertension, hyperlipidemia, ACV 80 in August of 2011 with a seizure at that time only. Since the stroke he is disabled and has weakness of his extremities. He also has tingling in all of his extremities at times. Blood pressure was the cause of his stroke. His memory is also affected by having had the stroke. He does check his blood pressures at home and generally they run in the 130s over the 70s.   Review of Systems  Constitutional: Negative.   HENT: Negative.   Eyes: Negative.   Respiratory: Negative.   Cardiovascular: Negative.   Gastrointestinal: Negative.   Endocrine: Negative.   Genitourinary: Negative.   Musculoskeletal: Positive for arthralgias (FEET AND HANDS).  Skin: Negative.   Allergic/Immunologic: Negative.   Neurological: Positive for seizures (DISORDER= NO SEIZURE ACTIVITY).  Hematological: Negative.   Psychiatric/Behavioral: Negative.        Objective:   Physical Exam Repeat blood pressure 130/98 right arm sitting. BP 130/100  Pulse 68  Temp(Src) 98.2 F (36.8 C) (Oral)  Ht 5' 6.5" (1.689 m)  Wt 187 lb (84.823 kg)  BMI 29.73 kg/m2  The patient appeared well nourished and normally developed, alert and oriented to time and place. Speech, behavior and judgement appear normal. Vital signs as documented.  Head exam is unremarkable. No scleral icterus or pallor noted. Ears nose and throat were within normal limits Neck is without jugular venous distension, thyromegally, or carotid bruits. Carotid upstrokes are brisk bilaterally. No cervical adenopathy. Lungs are clear anteriorly and posteriorly to auscultation. Normal respiratory effort. There is no axillary adenopathy. Cardiac exam reveals regular rate and rhythm at 72 per minute. First and second heart sounds normal.  No murmurs, rubs or  gallops.  Abdominal exam reveals normal bowl sounds, no masses, no organomegaly and no aortic enlargement. No inguinal adenopathy. There is no abdominal tenderness. There was an umbilical hernia appear Extremities are nonedematous and both femoral and pedal pulses are normal.  Skin without pallor or jaundice.  Warm and dry, without rash. Neurologic exam reveals normal deep tendon reflexes and normal sensation. There was notable weakness on the left compared to the right and both upper and lower extremities         Assessment & Plan:  1. HTN (hypertension) - NMR Lipoprofile with Lipids - Hepatic function panel - BASIC METABOLIC PANEL WITH GFR  2. Unspecified vitamin D deficiency - Vitamin D 25 hydroxy  3. CVA (cerebral vascular accident)  4. History of seizure disorder, since CVA  5. Hyperlipidemia  Patient Instructions  Fall precautions discussed at visit Continue current medications for the present Watch sodium intake Monitor her blood pressures twice daily until visit with clinical pharmacist On return to visit we will consider removing some of the diuretics that you're taking and substituting with possibly an increased dose of clonidine and or adding an angiotensin receptor blocker We will discuss with his sister whom he has seen neurologically, for possible considerations of removing the Dilantin      Nyra Capes MD

## 2012-12-13 NOTE — Progress Notes (Signed)
Pt offered a tdap - due to unkown last tdap- pt declined at this time

## 2012-12-13 NOTE — Patient Instructions (Addendum)
Fall precautions discussed at visit Continue current medications for the present Watch sodium intake Monitor her blood pressures twice daily until visit with clinical pharmacist On return to visit we will consider removing some of the diuretics that you're taking and substituting with possibly an increased dose of clonidine and or adding an angiotensin receptor blocker We will discuss with his sister whom he has seen neurologically, for possible considerations of removing the Dilantin

## 2012-12-14 ENCOUNTER — Telehealth: Payer: Self-pay | Admitting: Family Medicine

## 2012-12-14 LAB — HEPATIC FUNCTION PANEL
ALT: 50 U/L (ref 0–53)
Bilirubin, Direct: 0.2 mg/dL (ref 0.0–0.3)
Indirect Bilirubin: 0.5 mg/dL (ref 0.0–0.9)
Total Bilirubin: 0.7 mg/dL (ref 0.3–1.2)

## 2012-12-14 LAB — BASIC METABOLIC PANEL WITH GFR
BUN: 12 mg/dL (ref 6–23)
CO2: 31 mEq/L (ref 19–32)
GFR, Est African American: 84 mL/min
Glucose, Bld: 87 mg/dL (ref 70–99)
Potassium: 3.6 mEq/L (ref 3.5–5.3)
Sodium: 138 mEq/L (ref 135–145)

## 2012-12-14 LAB — THYROID PANEL WITH TSH
Free Thyroxine Index: 2.9 (ref 1.0–3.9)
T3 Uptake: 23.2 % (ref 22.5–37.0)
T4, Total: 12.4 ug/dL (ref 5.0–12.5)
TSH: 1.549 u[IU]/mL (ref 0.350–4.500)

## 2012-12-14 LAB — NMR LIPOPROFILE WITH LIPIDS
Cholesterol, Total: 209 mg/dL — ABNORMAL HIGH (ref <200)
HDL Size: 9.1 nm — ABNORMAL LOW (ref >=9.2)
HDL-C: 55 mg/dL (ref >=40)
LDL Particle Number: 1208 nmol/L — ABNORMAL HIGH (ref <1000)
Large HDL-P: 6.5 umol/L (ref >=4.8)
Large VLDL-P: 10.9 nmol/L — ABNORMAL HIGH (ref <=2.7)
Triglycerides: 270 mg/dL — ABNORMAL HIGH (ref <150)
VLDL Size: 50.7 nm — ABNORMAL HIGH (ref <=46.6)

## 2012-12-14 LAB — PHENYTOIN LEVEL, TOTAL: Phenytoin Lvl: 4.7 ug/mL — ABNORMAL LOW (ref 10.0–20.0)

## 2012-12-15 ENCOUNTER — Other Ambulatory Visit: Payer: Self-pay | Admitting: *Deleted

## 2012-12-15 DIAGNOSIS — E559 Vitamin D deficiency, unspecified: Secondary | ICD-10-CM

## 2012-12-15 MED ORDER — VITAMIN D3 1.25 MG (50000 UT) PO CAPS: 50000.0000 [IU] | ORAL_CAPSULE | ORAL | Status: DC

## 2012-12-17 ENCOUNTER — Telehealth: Payer: Self-pay | Admitting: *Deleted

## 2012-12-17 NOTE — Telephone Encounter (Signed)
Pharmacy notified ok to use substitute

## 2012-12-17 NOTE — Telephone Encounter (Signed)
Vitamin D 50,000 units one weekly for 12 weeks with 1 refill recheck vitamin D 3 month

## 2012-12-17 NOTE — Telephone Encounter (Signed)
Pharmacy states they do not have vitamin D3 in stock and would like to substitute with Vitamin D2 (ergocalciterol). Please advise. If ok please notify [facility] in [place]  At [phone]

## 2012-12-22 NOTE — Telephone Encounter (Signed)
I am not sure why I wanted this information now. Maybe it was just for information sake. Maybe just add this information on his record. I am not sure if we were going to make a referral back or not, you will have to ask the patient

## 2012-12-27 ENCOUNTER — Ambulatory Visit: Payer: Medicaid Other

## 2012-12-28 ENCOUNTER — Other Ambulatory Visit: Payer: Self-pay | Admitting: *Deleted

## 2012-12-28 MED ORDER — POTASSIUM CHLORIDE ER 10 MEQ PO TBCR: 10.0000 meq | EXTENDED_RELEASE_TABLET | Freq: Two times a day (BID) | ORAL | Status: DC

## 2012-12-28 NOTE — Telephone Encounter (Signed)
Patient last seen in office on 12-13-12. Received fax from pharmacy for refill on Klor con. They fax states that pt takes M10 once daily. Our med list states different. Please clarify

## 2012-12-28 NOTE — Telephone Encounter (Signed)
Please clarify and confirm this with patient before refilling medication

## 2013-01-24 ENCOUNTER — Emergency Department (HOSPITAL_COMMUNITY): Payer: Medicaid Other

## 2013-01-24 ENCOUNTER — Emergency Department (HOSPITAL_COMMUNITY)
Admission: EM | Admit: 2013-01-24 | Discharge: 2013-01-24 | Disposition: A | Discharge status: Home / Self Care | Payer: Medicaid Other | Attending: Emergency Medicine | Admitting: Emergency Medicine

## 2013-01-24 ENCOUNTER — Encounter (HOSPITAL_COMMUNITY): Payer: Self-pay

## 2013-01-24 DIAGNOSIS — I10 Essential (primary) hypertension: Secondary | ICD-10-CM | POA: Insufficient documentation

## 2013-01-24 DIAGNOSIS — Z7982 Long term (current) use of aspirin: Secondary | ICD-10-CM | POA: Insufficient documentation

## 2013-01-24 DIAGNOSIS — Z79899 Other long term (current) drug therapy: Secondary | ICD-10-CM | POA: Insufficient documentation

## 2013-01-24 DIAGNOSIS — R0789 Other chest pain: Secondary | ICD-10-CM

## 2013-01-24 DIAGNOSIS — Z8673 Personal history of transient ischemic attack (TIA), and cerebral infarction without residual deficits: Secondary | ICD-10-CM | POA: Insufficient documentation

## 2013-01-24 DIAGNOSIS — R945 Abnormal results of liver function studies: Secondary | ICD-10-CM | POA: Insufficient documentation

## 2013-01-24 DIAGNOSIS — F17200 Nicotine dependence, unspecified, uncomplicated: Secondary | ICD-10-CM | POA: Insufficient documentation

## 2013-01-24 DIAGNOSIS — G40909 Epilepsy, unspecified, not intractable, without status epilepticus: Secondary | ICD-10-CM | POA: Insufficient documentation

## 2013-01-24 DIAGNOSIS — R071 Chest pain on breathing: Secondary | ICD-10-CM | POA: Insufficient documentation

## 2013-01-24 LAB — CBC WITH DIFFERENTIAL/PLATELET
Basophils Absolute: 0 10*3/uL (ref 0.0–0.1)
Basophils Relative: 1 % (ref 0–1)
Eosinophils Absolute: 0.8 10*3/uL — ABNORMAL HIGH (ref 0.0–0.7)
Eosinophils Relative: 13 % — ABNORMAL HIGH (ref 0–5)
HCT: 44 % (ref 39.0–52.0)
MCHC: 36.1 g/dL — ABNORMAL HIGH (ref 30.0–36.0)
MCV: 87.6 fL (ref 78.0–100.0)
Monocytes Absolute: 0.6 10*3/uL (ref 0.1–1.0)
RDW: 12.9 % (ref 11.5–15.5)

## 2013-01-24 LAB — COMPREHENSIVE METABOLIC PANEL
AST: 114 U/L — ABNORMAL HIGH (ref 0–37)
Albumin: 3.5 g/dL (ref 3.5–5.2)
CO2: 30 mEq/L (ref 19–32)
Calcium: 9.4 mg/dL (ref 8.4–10.5)
Creatinine, Ser: 1.36 mg/dL — ABNORMAL HIGH (ref 0.50–1.35)
GFR calc non Af Amer: 56 mL/min — ABNORMAL LOW (ref >90)

## 2013-01-24 LAB — TROPONIN I: Troponin I: <0.3 ng/mL (ref <0.30)

## 2013-01-24 MED ORDER — HYDROCODONE-ACETAMINOPHEN 5-325 MG PO TABS: 2.0000 | ORAL_TABLET | ORAL | Status: DC | PRN

## 2013-01-24 MED ORDER — ASPIRIN 81 MG PO CHEW
324.0000 mg | CHEWABLE_TABLET | Freq: Once | ORAL | Status: AC
  Administered 2013-01-24: 324 mg via ORAL
  Filled 2013-01-24: qty 4

## 2013-01-24 NOTE — ED Notes (Addendum)
Pt c/o chest pain for 3 days that comes and goes with a knot in central chest area. Pain increases with palpation. Denies any shortness of breath, nausea, or fatigue. Denies any pain at present

## 2013-01-24 NOTE — ED Provider Notes (Signed)
CSN: [id number]     Arrival date & time 01/24/13  9562 History  This chart was scribed for Gilda Crease, MD by Bennett Scrape, ED Scribe. This patient was seen in room [facility] and the patient's care was started at 9:58 AM.    Chief Complaint  Patient presents with  . Chest Pain   The history is provided by the patient. No language interpreter was used.    HPI Comments: Philip [name] is a 57 y.o. male who presents to the Emergency Department complaining of 3 days of intermittent substernal CP. Pt states that he feels an associated palpable knot in his mid chest that is painful to touch but he denies having pain with movement. The most recent CP episode occurred this morning when he woke up today with a stiff chest at which point he decided to seek evaluation. He denies having pain currently. He has a h/o HTN but denies having a h/o DM, HLD and denies any cardiac history. He denies having a family h/o cardiac disease. He denies nausea, SOB, fatigue and leg swelling as associated symptoms.  Past Medical History  Diagnosis Date  . Hypertension   . Seizures   . Stroke    History reviewed. No pertinent past surgical history. Family History  Problem Relation Age of Onset  . Heart disease Maternal Grandmother   . Heart disease Maternal Grandfather   . Heart disease Paternal Grandmother   . Heart disease Paternal Grandfather   . Cancer Cousin   . Hypertension Mother    History  Substance Use Topics  . Smoking status: Current Every Day Smoker -- 0.25 packs/day    Types: Cigarettes    Start date: 05/26/1978  . Smokeless tobacco: Not on file  . Alcohol Use: 0.5 oz/week    1 drink(s) per week    Review of Systems  Constitutional: Negative for fatigue.  Respiratory: Negative for shortness of breath.   Cardiovascular: Positive for chest pain. Negative for leg swelling.  Gastrointestinal: Negative for nausea.  All other systems reviewed and are negative.    Allergies   Review of patient's allergies indicates no known allergies.  Home Medications   Current Outpatient Rx  Name  Route  Sig  Dispense  Refill  . amLODipine (NORVASC) 10 MG tablet   Oral   Take 10 mg by mouth daily.         Marland Kitchen aspirin 81 MG tablet   Oral   Take 1 tablet (81 mg total) by mouth daily.   30 tablet      . Cholecalciferol (VITAMIN D3) 50000 UNITS CAPS   Oral   Take 50,000 Units by mouth once a week.   4 capsule   4   . Cholecalciferol 50000 UNITS TABS   Oral   Take 50,000 Int'l Units by mouth once a week.   12 tablet   0   . cloNIDine (CATAPRES) 0.2 MG tablet   Oral   Take 0.2 mg by mouth 2 (two) times daily.         . furosemide (LASIX) 40 MG tablet   Oral   Take 1 tablet (40 mg total) by mouth daily.   30 tablet   3   . hydrochlorothiazide (HYDRODIURIL) 25 MG tablet   Oral   Take 25 mg by mouth daily.         . metoprolol succinate (TOPROL-XL) 50 MG 24 hr tablet   Oral   Take 50 mg by mouth daily.  Take with or immediately following a meal.         . phenytoin (DILANTIN) 100 MG ER capsule      TAKE ONE CAPSULE BY MOUTH THREE TIMES DAILY   90 capsule   0   . potassium chloride (K-DUR) 10 MEQ tablet   Oral   Take 1 tablet (10 mEq total) by mouth 2 (two) times daily.   60 tablet   3   . traMADol (ULTRAM) 50 MG tablet   Oral   Take 1 tablet (50 mg total) by mouth every 8 (eight) hours as needed for pain.   30 tablet   0   . triamterene-hydrochlorothiazide (MAXZIDE-25) 37.5-25 MG per tablet   Oral   Take 1 tablet by mouth daily.          Triage Vitals: BP 142/97  Pulse 65  Temp(Src) 98.2 F (36.8 C) (Oral)  Ht 5\' 7"  (1.702 m)  Wt 210 lb (95.255 kg)  BMI 32.88 kg/m2  SpO2 96%  Physical Exam  Nursing note and vitals reviewed. Constitutional: He is oriented to person, place, and time. He appears well-developed and well-nourished. No distress.  HENT:  Head: Normocephalic and atraumatic.  Right Ear: Hearing normal.  Left  Ear: Hearing normal.  Nose: Nose normal.  Mouth/Throat: Oropharynx is clear and moist and mucous membranes are normal.  Eyes: Conjunctivae and EOM are normal. Pupils are equal, round, and reactive to light.  Neck: Normal range of motion. Neck supple.  Cardiovascular: Normal rate, regular rhythm, S1 normal and S2 normal.  Exam reveals no gallop and no friction rub.   No murmur heard. Pulmonary/Chest: Effort normal and breath sounds normal. No respiratory distress. He exhibits tenderness.    No palpable deformities to the chest wall, no apparent chest wall tenderness to palpation   Abdominal: Soft. Normal appearance and bowel sounds are normal. There is no hepatosplenomegaly. There is no tenderness. There is no rebound, no guarding, no tenderness at McBurney's point and negative Murphy's sign. No hernia.  Musculoskeletal: Normal range of motion.  Neurological: He is alert and oriented to person, place, and time. He has normal strength. No cranial nerve deficit or sensory deficit. Coordination normal. GCS eye subscore is 4. GCS verbal subscore is 5. GCS motor subscore is 6.  Skin: Skin is warm, dry and intact. No rash noted. No cyanosis.  Psychiatric: He has a normal mood and affect. His speech is normal and behavior is normal. Thought content normal.    ED Course  Procedures (including critical care time)  DIAGNOSTIC STUDIES: Oxygen Saturation is 96% on room air, normal by my interpretation.    EKG:  Date: 01/24/2013  Rate: 64  Rhythm: normal sinus rhythm  QRS Axis: left  Intervals: normal  ST/T Wave abnormalities: normal  Conduction Disutrbances:none  Narrative Interpretation:   Old EKG Reviewed: unchanged    COORDINATION OF CARE: 10:00 AM-Discussed treatment plan which includes ASA, CXR, CBC panel, CMP and troponin with pt at bedside and pt agreed to plan.   Labs Review Labs Reviewed  CBC WITH DIFFERENTIAL - Abnormal; Notable for the following:    MCHC 36.1 (*)     Platelets 127 (*)    Eosinophils Relative 13 (*)    Eosinophils Absolute 0.8 (*)    All other components within normal limits  COMPREHENSIVE METABOLIC PANEL - Abnormal; Notable for the following:    Potassium 3.2 (*)    Chloride 95 (*)    Glucose, Bld 178 (*)  Creatinine, Ser 1.36 (*)    AST 114 (*)    ALT 104 (*)    Alkaline Phosphatase 121 (*)    GFR calc non Af Amer 56 (*)    GFR calc Af Amer 65 (*)    All other components within normal limits  TROPONIN I   Imaging Review Dg Chest 2 View  01/24/2013   *RADIOLOGY REPORT*  Clinical Data: Pain  CHEST - 2 VIEW  Comparison: 02/08/2010  Findings: The cardiac shadow is within normal limits.  The lungs are clear bilaterally.  No acute bony abnormality is seen.  IMPRESSION: No acute abnormality is noted.   Original Report Authenticated By: Alcide Clever, M.D.    MDM  Diagnosis: 1. Atypical chest pain 2. Chest wall pain 3. Elevated LFTs  Presents to the ER for evaluation of chest pain. Patient reports that he noticed pain to the left of his sternum earlier and the area is tender. He has felt a "knot". Palpation of the area reveals that he is palpating a rib, which is tender to touch. There is no crepitance. He denies injury. Patient's pain is reproducible with palpation as well as by range of motion of the torso, especially turning quickly to the right. This is consistent with musculoskeletal chest pain. Patient's EKG was unremarkable. Initial troponin was negative. Second troponin was also negative. I do not feel this is cardiac chest pain. Patient did, however, have mildly elevated LFTs. He is not present right upper quadrant tenderness or Murphy sign, but gallbladder disease is considered. Unfortunately, ultrasound hot available today because of the holiday. Patient scheduled for followup tomorrow for ultrasound. Return to the ER if symptoms worsen.  I personally performed the services described in this documentation, which was scribed in my  presence. The recorded information has been reviewed and is accurate.     Gilda Crease, MD 01/24/13 1416

## 2013-01-25 ENCOUNTER — Ambulatory Visit (HOSPITAL_COMMUNITY)
Admit: 2013-01-25 | Discharge: 2013-01-25 | Disposition: A | Discharge status: Home / Self Care | Payer: Medicaid Other | Attending: Emergency Medicine | Admitting: Emergency Medicine

## 2013-01-25 ENCOUNTER — Other Ambulatory Visit (HOSPITAL_COMMUNITY): Payer: Self-pay | Admitting: Emergency Medicine

## 2013-01-25 DIAGNOSIS — K8020 Calculus of gallbladder without cholecystitis without obstruction: Secondary | ICD-10-CM | POA: Insufficient documentation

## 2013-01-25 DIAGNOSIS — K7689 Other specified diseases of liver: Secondary | ICD-10-CM | POA: Insufficient documentation

## 2013-01-25 DIAGNOSIS — R7989 Other specified abnormal findings of blood chemistry: Secondary | ICD-10-CM | POA: Insufficient documentation

## 2013-01-25 DIAGNOSIS — R109 Unspecified abdominal pain: Secondary | ICD-10-CM

## 2013-01-25 DIAGNOSIS — R079 Chest pain, unspecified: Secondary | ICD-10-CM | POA: Insufficient documentation

## 2013-01-25 MED ORDER — NOREPINEPHRINE BITARTRATE 1 MG/ML IJ SOLN
INTRAMUSCULAR | Status: AC
  Filled 2013-01-25: qty 4

## 2013-01-25 NOTE — ED Provider Notes (Signed)
Pt found to have cholelithiasis by [place].  He denies abd pain and denies vomiting He will f/u with surgery (he reports he has the info at home)   Joya Gaskins, MD 01/25/13 [phone]

## 2013-01-27 ENCOUNTER — Encounter: Payer: Self-pay | Admitting: Family Medicine

## 2013-01-27 ENCOUNTER — Other Ambulatory Visit: Payer: Self-pay

## 2013-01-27 ENCOUNTER — Ambulatory Visit (INDEPENDENT_AMBULATORY_CARE_PROVIDER_SITE_OTHER): Payer: Medicaid Other | Admitting: Family Medicine

## 2013-01-27 VITALS — BP 129/96 | HR 71 | Temp 98.9°F | Wt 185.6 lb

## 2013-01-27 DIAGNOSIS — I6350 Cerebral infarction due to unspecified occlusion or stenosis of unspecified cerebral artery: Secondary | ICD-10-CM

## 2013-01-27 DIAGNOSIS — I639 Cerebral infarction, unspecified: Secondary | ICD-10-CM

## 2013-01-27 DIAGNOSIS — R7402 Elevation of levels of lactic acid dehydrogenase (LDH): Secondary | ICD-10-CM

## 2013-01-27 DIAGNOSIS — Z8669 Personal history of other diseases of the nervous system and sense organs: Secondary | ICD-10-CM

## 2013-01-27 DIAGNOSIS — R1011 Right upper quadrant pain: Secondary | ICD-10-CM

## 2013-01-27 DIAGNOSIS — R748 Abnormal levels of other serum enzymes: Secondary | ICD-10-CM

## 2013-01-27 DIAGNOSIS — K8020 Calculus of gallbladder without cholecystitis without obstruction: Secondary | ICD-10-CM

## 2013-01-27 DIAGNOSIS — G40909 Epilepsy, unspecified, not intractable, without status epilepticus: Secondary | ICD-10-CM

## 2013-01-27 DIAGNOSIS — E785 Hyperlipidemia, unspecified: Secondary | ICD-10-CM

## 2013-01-27 DIAGNOSIS — G4733 Obstructive sleep apnea (adult) (pediatric): Secondary | ICD-10-CM

## 2013-01-27 DIAGNOSIS — J45909 Unspecified asthma, uncomplicated: Secondary | ICD-10-CM

## 2013-01-27 DIAGNOSIS — I10 Essential (primary) hypertension: Secondary | ICD-10-CM

## 2013-01-27 HISTORY — DX: Calculus of gallbladder without cholecystitis without obstruction: K80.20

## 2013-01-27 MED ORDER — POTASSIUM CHLORIDE ER 10 MEQ PO TBCR: 10.0000 meq | EXTENDED_RELEASE_TABLET | Freq: Two times a day (BID) | ORAL | Status: DC

## 2013-01-27 MED ORDER — PHENYTOIN SODIUM EXTENDED 100 MG PO CAPS: 100.0000 mg | ORAL_CAPSULE | Freq: Three times a day (TID) | ORAL | Status: DC

## 2013-01-27 NOTE — Progress Notes (Signed)
Patient ID: Philip [name], male   DOB: 02-Feb-1956, 57 y.o.   MRN: [id number] SUBJECTIVE: CC: Chief Complaint  Patient presents with  . Follow-up    place on face -resolved . went to [facility] 01-24-13 for chest pain had ekg and cardiac enzymes . has gallstones and is sch for surgery  . Hospitalization Follow-up      needs referral to surgeon gallstones see report and still c/o chest pain     HPI: Pain has resolved but it comes and goes. The ED recommended a surgeon but he came for [place] to refer him. His family says he needs seizure medication refilled.  Past Medical History  Diagnosis Date  . Hypertension   . Seizures   . Stroke    No past surgical history on file. History   Social History  . Marital Status: Single    Spouse Name: N/A    Number of Children: N/A  . Years of Education: N/A   Occupational History  . Not on file.   Social History Main Topics  . Smoking status: Current Every Day Smoker -- 0.25 packs/day    Types: Cigarettes    Start date: 05/26/1978  . Smokeless tobacco: Not on file  . Alcohol Use: 0.5 oz/week    1 drink(s) per week  . Drug Use: No  . Sexual Activity: Not on file   Other Topics Concern  . Not on file   Social History Narrative  . No narrative on file   Family History  Problem Relation Age of Onset  . Heart disease Maternal Grandmother   . Heart disease Maternal Grandfather   . Heart disease Paternal Grandmother   . Heart disease Paternal Grandfather   . Cancer Cousin   . Hypertension Mother    Current Outpatient Prescriptions on File Prior to Visit  Medication Sig Dispense Refill  . amLODipine (NORVASC) 10 MG tablet Take 10 mg by mouth daily.      Marland Kitchen aspirin EC 81 MG tablet Take 81 mg by mouth daily.      . Cholecalciferol (VITAMIN D3) 50000 UNITS CAPS Take 50,000 Units by mouth once a week.  4 capsule  4  . cloNIDine (CATAPRES) 0.2 MG tablet Take 0.2 mg by mouth 2 (two) times daily.      . fish oil-omega-3 fatty acids 1000 MG capsule  Take 2 g by mouth daily.      . furosemide (LASIX) 40 MG tablet Take 1 tablet (40 mg total) by mouth daily.  30 tablet  3  . hydrALAZINE (APRESOLINE) 25 MG tablet Take 25 mg by mouth daily.      Marland Kitchen HYDROcodone-acetaminophen (NORCO/VICODIN) 5-325 MG per tablet Take 2 tablets by mouth every 4 (four) hours as needed for pain.  10 tablet  0  . metoprolol succinate (TOPROL-XL) 50 MG 24 hr tablet Take 50 mg by mouth daily. Take with or immediately following a meal.      . topiramate (TOPAMAX) 25 MG tablet Take 25 mg by mouth 2 (two) times daily.      Marland Kitchen triamterene-hydrochlorothiazide (MAXZIDE-25) 37.5-25 MG per tablet Take 1 tablet by mouth daily.       No current facility-administered medications on file prior to visit.   No Known Allergies  There is no immunization history on file for this patient. Prior to Admission medications   Medication Sig Start Date End Date Taking? Authorizing Provider  amLODipine (NORVASC) 10 MG tablet Take 10 mg by mouth daily.  Historical Provider, MD  aspirin EC 81 MG tablet Take 81 mg by mouth daily.    Historical Provider, MD  Cholecalciferol (VITAMIN D3) 50000 UNITS CAPS Take 50,000 Units by mouth once a week. 12/15/12   Ernestina Penna, MD  cloNIDine (CATAPRES) 0.2 MG tablet Take 0.2 mg by mouth 2 (two) times daily.    Historical Provider, MD  fish oil-omega-3 fatty acids 1000 MG capsule Take 2 g by mouth daily.    Historical Provider, MD  furosemide (LASIX) 40 MG tablet Take 1 tablet (40 mg total) by mouth daily. 11/23/12   Ernestina Penna, MD  hydrALAZINE (APRESOLINE) 25 MG tablet Take 25 mg by mouth daily.    Historical Provider, MD  HYDROcodone-acetaminophen (NORCO/VICODIN) 5-325 MG per tablet Take 2 tablets by mouth every 4 (four) hours as needed for pain. 01/24/13   Gilda Crease, MD  metoprolol succinate (TOPROL-XL) 50 MG 24 hr tablet Take 50 mg by mouth daily. Take with or immediately following a meal.    Historical Provider, MD  phenytoin (DILANTIN)  100 MG ER capsule Take 1 capsule (100 mg total) by mouth 3 (three) times daily. 01/27/13   Ileana Ladd, MD  potassium chloride (K-DUR) 10 MEQ tablet Take 1 tablet (10 mEq total) by mouth 2 (two) times daily. 01/27/13   Ernestina Penna, MD  topiramate (TOPAMAX) 25 MG tablet Take 25 mg by mouth 2 (two) times daily.    Historical Provider, MD  triamterene-hydrochlorothiazide (MAXZIDE-25) 37.5-25 MG per tablet Take 1 tablet by mouth daily.    Historical Provider, MD     ROS: As above in the HPI. All other systems are stable or negative.  OBJECTIVE: APPEARANCE:  Patient in no acute distress.The patient appeared well nourished and normally developed. Acyanotic. Waist: VITAL SIGNS:BP 129/96  Pulse 71  Temp(Src) 98.9 F (37.2 C) (Oral)  Wt 185 lb 9.6 oz (84.188 kg)  BMI 29.06 kg/m2  AAM  SKIN: warm and  Dry without overt rashes, tattoos and scars  HEAD and Neck: without JVD, Head and scalp: normal Eyes:No scleral icterus. Fundi normal, eye movements normal. Ears: Auricle normal, canal normal, Tympanic membranes normal, insufflation normal. Nose: normal Throat: normal Neck & thyroid: normal  CHEST & LUNGS: Chest wall: normal Lungs: Clear  CVS: Reveals the PMI to be normally located. Regular rhythm, First and Second Heart sounds are normal,  absence of murmurs, rubs or gallops. Peripheral vasculature: Radial pulses: normal Dorsal pedis pulses: normal Posterior pulses: normal  ABDOMEN:  Appearance: normal Benign, no organomegaly, no masses, no Abdominal Aortic enlargement. No Guarding , no rebound. No Bruits. Bowel sounds: normal  RECTAL: N/A GU: N/A  EXTREMETIES: nonedematous.  MUSCULOSKELETAL:  Spine: normal Joints: intact  NEUROLOGIC: oriented to time,place and person; nonfocal. Strength is normal Sensory is normal Reflexes are normal Cranial Nerves are normal.  ASSESSMENT: Seizure disorder - Plan: phenytoin (DILANTIN) 100 MG ER capsule  Gallstones - Plan:  Hepatic function panel, Ambulatory referral to General Surgery  Abdominal pain, right upper quadrant - Plan: Hepatic function panel, Amylase, Lipase  Abnormal transaminases - Plan: Hepatic function panel, Amylase, Lipase  CVA (cerebral vascular accident)  History of seizure disorder, since CVA  HTN (hypertension)  OSA (obstructive sleep apnea)  Hyperlipidemia  Unspecified asthma(493.90)   PLAN: Orders Placed This Encounter  Procedures  . Hepatic function panel  . Amylase  . Lipase  . Ambulatory referral to General Surgery    Referral Priority:  Routine    Referral Type:  Surgical    Referral Reason:  Specialty Services Required    Requested Specialty:  General Surgery    Number of Visits Requested:  1    Meds ordered this encounter  Medications  . phenytoin (DILANTIN) 100 MG ER capsule    Sig: Take 1 capsule (100 mg total) by mouth 3 (three) times daily.    Dispense:  90 capsule    Refill:  2    Results for orders placed during the hospital encounter of 01/24/13  CBC WITH DIFFERENTIAL      Result Value Range   WBC 6.0  4.0 - 10.5 K/uL   RBC 5.02  4.22 - 5.81 MIL/uL   Hemoglobin 15.9  13.0 - 17.0 g/dL   HCT 16.1  09.6 - 04.5 %   MCV 87.6  78.0 - 100.0 fL   MCH 31.7  26.0 - 34.0 pg   MCHC 36.1 (*) 30.0 - 36.0 g/dL   RDW 40.9  81.1 - 91.4 %   Platelets 127 (*) 150 - 400 K/uL   Neutrophils Relative % 47  43 - 77 %   Neutro Abs 2.9  1.7 - 7.7 K/uL   Lymphocytes Relative 29  12 - 46 %   Lymphs Abs 1.8  0.7 - 4.0 K/uL   Monocytes Relative 10  3 - 12 %   Monocytes Absolute 0.6  0.1 - 1.0 K/uL   Eosinophils Relative 13 (*) 0 - 5 %   Eosinophils Absolute 0.8 (*) 0.0 - 0.7 K/uL   Basophils Relative 1  0 - 1 %   Basophils Absolute 0.0  0.0 - 0.1 K/uL  COMPREHENSIVE METABOLIC PANEL      Result Value Range   Sodium 136  135 - 145 mEq/L   Potassium 3.2 (*) 3.5 - 5.1 mEq/L   Chloride 95 (*) 96 - 112 mEq/L   CO2 30  19 - 32 mEq/L   Glucose, Bld 178 (*) 70 - 99  mg/dL   BUN 11  6 - 23 mg/dL   Creatinine, Ser 7.82 (*) 0.50 - 1.35 mg/dL   Calcium 9.4  8.4 - 95.6 mg/dL   Total Protein 7.6  6.0 - 8.3 g/dL   Albumin 3.5  3.5 - 5.2 g/dL   AST 213 (*) 0 - 37 U/L   ALT 104 (*) 0 - 53 U/L   Alkaline Phosphatase 121 (*) 39 - 117 U/L   Total Bilirubin 0.8  0.3 - 1.2 mg/dL   GFR calc non Af Amer 56 (*) >90 mL/min   GFR calc Af Amer 65 (*) >90 mL/min  TROPONIN I      Result Value Range   Troponin I <0.30  <0.30 ng/mL  TROPONIN I      Result Value Range   Troponin I <0.30  <0.30 ng/mL   CLINICAL DATA: 57 year old male with abdominal pain, chest pain,  abnormal LFTs.  EXAM:  ABDOMEN ULTRASOUND  COMPARISON: 06/10/2011  FINDINGS:  Gallbladder  Contracted. Echogenic stones the identified (images 53 and 55). No  sonographic Murphy sign elicited. Gallbladder wall thickness cannot  be determined.  Common bile duct  Diameter: Normal, 5 mm diameter.  Chronically increased echogenicity. No intrahepatic ductal  dilatation or discrete liver lesion.  Liver  No focal lesion identified. Within normal limits in parenchymal  echogenicity.  IVC  No abnormality visualized.  Pancreas  Incompletely visualized due to overlying bowel gas, visualized  portions within normal limits.  Spleen  Size and appearance within normal limits.  Right Kidney  Length: 11.4 cm. Echogenicity within normal limits. No mass or  hydronephrosis visualized.  Left Kidney  Length: 10.1 cm. Echogenicity within normal limits. No mass or  hydronephrosis visualized.  Abdominal aorta  Incompletely visualized due to overlying bowel gas, visualized  portions within normal limits.  IMPRESSION:  1. Contracted gallbladder today. Chronic cholelithiasis. No strong  evidence of acute cholecystitis.  2. Chronic hepatic steatosis. No biliary ductal dilatation.  Electronically Signed  By: Augusto Gamble  On: 01/25/2013 16:38   To ED stat if acute exacerbation of pain. Return in about 2 months  (around 03/29/2013) for Recheck medical problems.  Takeshi Teasdale P. Modesto Charon, M.D.

## 2013-01-28 LAB — HEPATIC FUNCTION PANEL
ALT: 77 IU/L — ABNORMAL HIGH (ref 0–44)
AST: 62 IU/L — ABNORMAL HIGH (ref 0–40)
Albumin: 4.7 g/dL (ref 3.5–5.5)
Alkaline Phosphatase: 118 IU/L — ABNORMAL HIGH (ref 39–117)
Bilirubin, Direct: 0.22 mg/dL (ref 0.00–0.40)
Total Bilirubin: 0.4 mg/dL (ref 0.0–1.2)
Total Protein: 7.7 g/dL (ref 6.0–8.5)

## 2013-01-28 LAB — AMYLASE: Amylase: 41 U/L (ref 31–124)

## 2013-01-28 LAB — LIPASE: Lipase: 31 U/L (ref 0–59)

## 2013-01-31 ENCOUNTER — Encounter (HOSPITAL_COMMUNITY): Payer: Self-pay | Admitting: Emergency Medicine

## 2013-01-31 ENCOUNTER — Emergency Department (HOSPITAL_COMMUNITY)
Admission: EM | Admit: 2013-01-31 | Discharge: 2013-01-31 | Disposition: A | Discharge status: Home / Self Care | Payer: Medicaid Other | Attending: Emergency Medicine | Admitting: Emergency Medicine

## 2013-01-31 ENCOUNTER — Telehealth: Payer: Self-pay | Admitting: Family Medicine

## 2013-01-31 DIAGNOSIS — E876 Hypokalemia: Secondary | ICD-10-CM

## 2013-01-31 DIAGNOSIS — K8020 Calculus of gallbladder without cholecystitis without obstruction: Secondary | ICD-10-CM

## 2013-01-31 DIAGNOSIS — Z8673 Personal history of transient ischemic attack (TIA), and cerebral infarction without residual deficits: Secondary | ICD-10-CM | POA: Insufficient documentation

## 2013-01-31 DIAGNOSIS — Z79899 Other long term (current) drug therapy: Secondary | ICD-10-CM | POA: Insufficient documentation

## 2013-01-31 DIAGNOSIS — K8050 Calculus of bile duct without cholangitis or cholecystitis without obstruction: Secondary | ICD-10-CM

## 2013-01-31 DIAGNOSIS — F17200 Nicotine dependence, unspecified, uncomplicated: Secondary | ICD-10-CM | POA: Insufficient documentation

## 2013-01-31 DIAGNOSIS — Z7982 Long term (current) use of aspirin: Secondary | ICD-10-CM | POA: Insufficient documentation

## 2013-01-31 DIAGNOSIS — G40909 Epilepsy, unspecified, not intractable, without status epilepticus: Secondary | ICD-10-CM | POA: Insufficient documentation

## 2013-01-31 DIAGNOSIS — I10 Essential (primary) hypertension: Secondary | ICD-10-CM | POA: Insufficient documentation

## 2013-01-31 LAB — CBC WITH DIFFERENTIAL/PLATELET
Eosinophils Absolute: 0.6 10*3/uL (ref 0.0–0.7)
Hemoglobin: 16.1 g/dL (ref 13.0–17.0)
Lymphocytes Relative: 24 % (ref 12–46)
Lymphs Abs: 1.4 10*3/uL (ref 0.7–4.0)
MCH: 31.8 pg (ref 26.0–34.0)
Monocytes Relative: 14 % — ABNORMAL HIGH (ref 3–12)
Neutro Abs: 3.1 10*3/uL (ref 1.7–7.7)
Neutrophils Relative %: 52 % (ref 43–77)
RBC: 5.07 MIL/uL (ref 4.22–5.81)

## 2013-01-31 LAB — COMPREHENSIVE METABOLIC PANEL
Alkaline Phosphatase: 99 U/L (ref 39–117)
BUN: 11 mg/dL (ref 6–23)
CO2: 32 mEq/L (ref 19–32)
Chloride: 89 mEq/L — ABNORMAL LOW (ref 96–112)
GFR calc Af Amer: 71 mL/min — ABNORMAL LOW (ref >90)
GFR calc non Af Amer: 61 mL/min — ABNORMAL LOW (ref >90)
Glucose, Bld: 168 mg/dL — ABNORMAL HIGH (ref 70–99)
Potassium: 2.9 mEq/L — ABNORMAL LOW (ref 3.5–5.1)
Total Bilirubin: 0.3 mg/dL (ref 0.3–1.2)

## 2013-01-31 LAB — LIPASE, BLOOD: Lipase: 34 U/L (ref 11–59)

## 2013-01-31 MED ORDER — ONDANSETRON HCL 4 MG PO TABS: 4.0000 mg | ORAL_TABLET | Freq: Four times a day (QID) | ORAL | Status: DC

## 2013-01-31 MED ORDER — OXYCODONE-ACETAMINOPHEN 5-325 MG PO TABS: 2.0000 | ORAL_TABLET | Freq: Four times a day (QID) | ORAL | Status: DC | PRN

## 2013-01-31 MED ORDER — POTASSIUM CHLORIDE 20 MEQ/15ML (10%) PO LIQD
40.0000 meq | Freq: Once | ORAL | Status: AC
Start: 2013-01-31 — End: 2013-01-31
  Administered 2013-01-31: 40 meq via ORAL
  Filled 2013-01-31: qty 30

## 2013-01-31 MED ORDER — OXYCODONE-ACETAMINOPHEN 5-325 MG PO TABS
1.0000 | ORAL_TABLET | Freq: Once | ORAL | Status: AC
  Administered 2013-01-31: 1 via ORAL
  Filled 2013-01-31: qty 1

## 2013-01-31 MED ORDER — ONDANSETRON HCL 4 MG/2ML IJ SOLN
4.0000 mg | Freq: Once | INTRAMUSCULAR | Status: AC
  Administered 2013-01-31: 4 mg via INTRAVENOUS
  Filled 2013-01-31: qty 2

## 2013-01-31 MED ORDER — MORPHINE SULFATE 4 MG/ML IJ SOLN
4.0000 mg | Freq: Once | INTRAMUSCULAR | Status: AC
  Administered 2013-01-31: 4 mg via INTRAVENOUS
  Filled 2013-01-31: qty 1

## 2013-01-31 NOTE — ED Provider Notes (Signed)
TIME SEEN: 6:01 PM  CHIEF COMPLAINT: Epigastric abdominal pain, nausea  HPI: Patient is a 57 y.o. male with a history of hypertension, prior stroke, seizure disorder who presents to the emergency department with complaints of epigastric abdominal pain. Patient was recently seen the emergency department and diagnosed with cholelithiasis. He has outpatient surgery followup scheduled for Friday, 4 days from now. Patient reports that he irritated his epigastric pain by eating tacos today. No fever or chills. No shortness of breath. He has had nausea but no vomiting. No bloody stools or melena. He does report taking occasional NSAIDs and occasional alcohol but no prior history of peptic ulcer disease or pancreatitis. He describes as a moderate, sharp pain without radiation. Worse with eating fatty meals. Better after pain medication.   ROS: See HPI Constitutional: no fever  Eyes: no drainage  ENT: no runny nose   Cardiovascular:  no chest pain  Resp: no SOB  GI: no vomiting GU: no dysuria Integumentary: no rash  Allergy: no hives  Musculoskeletal: no leg swelling  Neurological: no slurred speech ROS otherwise negative  PAST MEDICAL HISTORY/PAST SURGICAL HISTORY:  Past Medical History  Diagnosis Date  . Hypertension   . Seizures   . Stroke     MEDICATIONS:  Prior to Admission medications   Medication Sig Start Date End Date Taking? Authorizing Provider  amLODipine (NORVASC) 10 MG tablet Take 10 mg by mouth daily.    Historical Provider, MD  aspirin EC 81 MG tablet Take 81 mg by mouth daily.    Historical Provider, MD  Cholecalciferol (VITAMIN D3) 50000 UNITS CAPS Take 50,000 Units by mouth once a week. 12/15/12   Ernestina Penna, MD  cloNIDine (CATAPRES) 0.2 MG tablet Take 0.2 mg by mouth 2 (two) times daily.    Historical Provider, MD  fish oil-omega-3 fatty acids 1000 MG capsule Take 2 g by mouth daily.    Historical Provider, MD  furosemide (LASIX) 40 MG tablet Take 1 tablet (40 mg  total) by mouth daily. 11/23/12   Ernestina Penna, MD  hydrALAZINE (APRESOLINE) 25 MG tablet Take 25 mg by mouth daily.    Historical Provider, MD  HYDROcodone-acetaminophen (NORCO/VICODIN) 5-325 MG per tablet Take 2 tablets by mouth every 4 (four) hours as needed for pain. 01/24/13   Gilda Crease, MD  metoprolol succinate (TOPROL-XL) 50 MG 24 hr tablet Take 50 mg by mouth daily. Take with or immediately following a meal.    Historical Provider, MD  phenytoin (DILANTIN) 100 MG ER capsule Take 1 capsule (100 mg total) by mouth 3 (three) times daily. 01/27/13   Ileana Ladd, MD  potassium chloride (K-DUR) 10 MEQ tablet Take 1 tablet (10 mEq total) by mouth 2 (two) times daily. 01/27/13   Ernestina Penna, MD  topiramate (TOPAMAX) 25 MG tablet Take 25 mg by mouth 2 (two) times daily.    Historical Provider, MD  triamterene-hydrochlorothiazide (MAXZIDE-25) 37.5-25 MG per tablet Take 1 tablet by mouth daily.    Historical Provider, MD    ALLERGIES:  No Known Allergies  SOCIAL HISTORY:  History  Substance Use Topics  . Smoking status: Current Every Day Smoker -- 0.25 packs/day    Types: Cigarettes    Start date: 05/26/1978  . Smokeless tobacco: Not on file  . Alcohol Use: 0.5 oz/week    1 drink(s) per week    FAMILY HISTORY: Family History  Problem Relation Age of Onset  . Heart disease Maternal Grandmother   .  Heart disease Maternal Grandfather   . Heart disease Paternal Grandmother   . Heart disease Paternal Grandfather   . Cancer Cousin   . Hypertension Mother     EXAM: There were no vitals taken for this visit. CONSTITUTIONAL: Alert and oriented and responds appropriately to questions. Well-appearing; well-nourished HEAD: Normocephalic EYES: Conjunctivae clear, PERRL ENT: normal nose; no rhinorrhea; moist mucous membranes; pharynx without lesions noted NECK: Supple, no meningismus, no LAD  CARD: RRR; S1 and S2 appreciated; no murmurs, no clicks, no rubs, no gallops, very  tender to palpation over the central chest wall and xiphoid process without crepitus or ecchymosis RESP: Normal chest excursion without splinting or tachypnea; breath sounds clear and equal bilaterally; no wheezes, no rhonchi, no rales,  ABD/GI: Normal bowel sounds; non-distended; soft, tender to palpation in the right upper quadrant epigastric region without rebound or guarding, negative Murphy sign BACK:  The back appears normal and is non-tender to palpation, there is no CVA tenderness EXT: Normal ROM in all joints; non-tender to palpation; no edema; normal capillary refill; no cyanosis    SKIN: Normal color for age and race; warm NEURO: Moves all extremities equally PSYCH: The patient's mood and manner are appropriate. Grooming and personal hygiene are appropriate.  MEDICAL DECISION MAKING: Patient with known cholelithiasis who presents with worsening pain after eating fatty food today. He is hemodynamically stable. Denies any fever or vomiting. We'll repeat labs including troponin and EKG given patient is describing some chest pain but has no shortness of breath, diaphoresis or lightheadedness. Patient reports his pain started at 1 PM. I feel he'll need one set of cardiac enzymes to rule out at this point. He has surgery outpatient followup. If workup is unchanged, anticipate discharge home once pain is controlled. Patient is in agreement with this plan.    Date: 01/31/2013 18:45  Rate: 84  Rhythm: normal sinus rhythm  QRS Axis: LAD  Intervals: normal  ST/T Wave abnormalities: normal  Conduction Disutrbances: none  Narrative Interpretation: Left axis deviation, poor R-wave progression, no ischemic changes, artifact present     ED PROGRESS: Patient's labs are unremarkable other than mild hypokalemia. He is currently on potassium 10 mEq once daily as he is on diuretics. Will have him double up on his potassium over the next 3 days. Will give oral replacement in the ED as well. Will have  him follow up with primary care physician to have this rechecked. He feels his abdominal pain is well-controlled. He's been able to tolerate by mouth. Patient has surgery followup. Given abdominal pain return precautions. Patient verbalizes understanding and is comfortable this plan.    Layla Maw Ward, DO 01/31/13 2007

## 2013-01-31 NOTE — ED Notes (Signed)
Pt to ED [facility] with c/o abdominal pain, states he recently diagnosed with gallstones. Pt alert and oriented x4. Left sided deficit (weakness) from CVA 3 years ago. Per EMS, BP 124/plapated, SpO2-96% on room air, RR-18, Pulse-88

## 2013-02-02 NOTE — Progress Notes (Signed)
Quick Note:  Labs abnormal. The liver enzymes are elevated but better. This is from passing a gall stone. No change in plans, see the surgeon. ______

## 2013-02-04 ENCOUNTER — Ambulatory Visit (INDEPENDENT_AMBULATORY_CARE_PROVIDER_SITE_OTHER): Payer: Medicaid Other | Admitting: Surgery

## 2013-02-04 ENCOUNTER — Encounter (INDEPENDENT_AMBULATORY_CARE_PROVIDER_SITE_OTHER): Payer: Self-pay | Admitting: Surgery

## 2013-02-04 VITALS — BP 126/77 | HR 62 | Temp 98.1°F | Resp 12 | Ht 67.0 in | Wt 187.8 lb

## 2013-02-04 DIAGNOSIS — K429 Umbilical hernia without obstruction or gangrene: Secondary | ICD-10-CM

## 2013-02-04 DIAGNOSIS — K8010 Calculus of gallbladder with chronic cholecystitis without obstruction: Secondary | ICD-10-CM

## 2013-02-04 HISTORY — DX: Calculus of gallbladder with chronic cholecystitis without obstruction: K80.10

## 2013-02-04 HISTORY — DX: Umbilical hernia without obstruction or gangrene: K42.9

## 2013-02-04 NOTE — Patient Instructions (Addendum)
Stop your aspirin 5 days before surgery. 

## 2013-02-04 NOTE — Progress Notes (Signed)
Patient ID: Philip [name], male   DOB: August 14, 1955, 57 y.o.   MRN: [id number]  Chief Complaint  Patient presents with  . Abdominal Pain    HPI Philip [name] is a 57 y.o. male.  Referred by Philip [name] Abdominal Pain Associated symptoms: no chest pain, no chills, no constipation, no cough, no diarrhea, no fever, no hematuria, no nausea, no sore throat and no vomiting    This is a 57 yo male who recently went to the ED with severe epigastric pain after eating at [facility].  He has had previous milder episodes of RUQ abdominal pain.  He denies any fever, nausea, vomiting, diarrhea. Ultrasound showed gallstones.  He has been asymptomatic for the last few days.  Past Medical History  Diagnosis Date  . Hypertension   . Seizures   . Stroke     History reviewed. No pertinent past surgical history.  Family History  Problem Relation Age of Onset  . Heart disease Maternal Grandmother   . Heart disease Maternal Grandfather   . Heart disease Paternal Grandmother   . Heart disease Paternal Grandfather   . Cancer Cousin   . Hypertension Mother     Social History History  Substance Use Topics  . Smoking status: Current Every Day Smoker -- 0.25 packs/day    Types: Cigarettes    Start date: 05/26/1978  . Smokeless tobacco: Not on file  . Alcohol Use: 0.5 oz/week    1 drink(s) per week    No Known Allergies  Current Outpatient Prescriptions  Medication Sig Dispense Refill  . amLODipine (NORVASC) 10 MG tablet Take 10 mg by mouth daily.      Marland Kitchen aspirin EC 81 MG tablet Take 81 mg by mouth daily.      . Cholecalciferol (VITAMIN D3) 50000 UNITS CAPS Take 50,000 Units by mouth once a week.  4 capsule  4  . cloNIDine (CATAPRES) 0.2 MG tablet Take 0.2 mg by mouth 2 (two) times daily.      . fish oil-omega-3 fatty acids 1000 MG capsule Take 2 g by mouth daily.      . furosemide (LASIX) 40 MG tablet Take 1 tablet (40 mg total) by mouth daily.  30 tablet  3  . hydrALAZINE (APRESOLINE) 25 MG tablet  Take 25 mg by mouth daily.      Marland Kitchen HYDROcodone-acetaminophen (NORCO/VICODIN) 5-325 MG per tablet Take 2 tablets by mouth every 4 (four) hours as needed for pain.  10 tablet  0  . metoprolol succinate (TOPROL-XL) 50 MG 24 hr tablet Take 50 mg by mouth daily. Take with or immediately following a meal.      . ondansetron (ZOFRAN) 4 MG tablet Take 1 tablet (4 mg total) by mouth every 6 (six) hours.  12 tablet  0  . oxyCODONE-acetaminophen (PERCOCET/ROXICET) 5-325 MG per tablet Take 2 tablets by mouth every 6 (six) hours as needed for pain.  20 tablet  0  . phenytoin (DILANTIN) 100 MG ER capsule Take 1 capsule (100 mg total) by mouth 3 (three) times daily.  90 capsule  2  . potassium chloride (K-DUR) 10 MEQ tablet Take 1 tablet (10 mEq total) by mouth 2 (two) times daily.  60 tablet  2  . topiramate (TOPAMAX) 25 MG tablet Take 25 mg by mouth 2 (two) times daily.      Marland Kitchen triamterene-hydrochlorothiazide (MAXZIDE-25) 37.5-25 MG per tablet Take 1 tablet by mouth daily.       No current facility-administered medications  for this visit.    Review of Systems Review of Systems  Constitutional: Negative for fever, chills and unexpected weight change.  HENT: Negative for hearing loss, congestion, sore throat, trouble swallowing and voice change.   Eyes: Negative for visual disturbance.  Respiratory: Negative for cough and wheezing.   Cardiovascular: Negative for chest pain, palpitations and leg swelling.  Gastrointestinal: Positive for abdominal pain. Negative for nausea, vomiting, diarrhea, constipation, blood in stool, abdominal distention, anal bleeding and rectal pain.  Genitourinary: Negative for hematuria and difficulty urinating.  Musculoskeletal: Negative for arthralgias.  Skin: Negative for rash and wound.  Neurological: Negative for seizures, syncope, weakness and headaches.  Hematological: Negative for adenopathy. Does not bruise/bleed easily.  Psychiatric/Behavioral: Negative for confusion.     Blood pressure 126/77, pulse 62, temperature 98.1 F (36.7 C), temperature source Temporal, resp. rate 12, height 5\' 7"  (1.702 m), weight 187 lb 12.8 oz (85.186 kg).  Physical Exam Physical Exam WDWN in NAD HEENT:  EOMI, sclera anicteric Neck:  No masses, no thyromegaly Lungs:  CTA bilaterally; normal respiratory effort CV:  Regular rate and rhythm; no murmurs Abd:  +bowel sounds, soft, non-tender, protruding umbilical hernia with 2 cm defect Ext:  Well-perfused; no edema Skin:  Warm, dry; no sign of jaundice  Data Reviewed Lab Results  Component Value Date   WBC 5.9 01/31/2013   HGB 16.1 01/31/2013   HCT 44.3 01/31/2013   MCV 87.4 01/31/2013   PLT 120* 01/31/2013   Lab Results  Component Value Date   CREATININE 1.27 01/31/2013   BUN 11 01/31/2013   NA 133* 01/31/2013   K 2.9* 01/31/2013   CL 89* 01/31/2013   CO2 32 01/31/2013   Lab Results  Component Value Date   ALT 52 01/31/2013   AST 53* 01/31/2013   ALKPHOS 99 01/31/2013   BILITOT 0.3 01/31/2013   CLINICAL DATA: 57 year old male with abdominal pain, chest pain,  abnormal LFTs.  EXAM:  ABDOMEN ULTRASOUND  COMPARISON: 06/10/2011  FINDINGS:  Gallbladder  Contracted. Echogenic stones the identified (images 53 and 55). No  sonographic Murphy sign elicited. Gallbladder wall thickness cannot  be determined.  Common bile duct  Diameter: Normal, 5 mm diameter.  Chronically increased echogenicity. No intrahepatic ductal  dilatation or discrete liver lesion.  Liver  No focal lesion identified. Within normal limits in parenchymal  echogenicity.  IVC  No abnormality visualized.  Pancreas  Incompletely visualized due to overlying bowel gas, visualized  portions within normal limits.  Spleen  Size and appearance within normal limits.  Right Kidney  Length: 11.4 cm. Echogenicity within normal limits. No mass or  hydronephrosis visualized.  Left Kidney  Length: 10.1 cm. Echogenicity within normal limits. No mass or  hydronephrosis  visualized.  Abdominal aorta  Incompletely visualized due to overlying bowel gas, visualized  portions within normal limits.  IMPRESSION:  1. Contracted gallbladder today. Chronic cholelithiasis. No strong  evidence of acute cholecystitis.  2. Chronic hepatic steatosis. No biliary ductal dilatation.  Electronically Signed  By: Augusto Gamble  On: 01/25/2013 16:38   Assessment    Chronic calculus cholecystitis Umbilical hernia    Plan    Laparoscopic cholecystectomy with intraoperative cholangiogram/ open umbilical hernia repair.  The surgical procedure has been discussed with the patient.  Potential risks, benefits, alternative treatments, and expected outcomes have been explained.  All of the patient's questions at this time have been answered.  The likelihood of reaching the patient's treatment goal is good.  The patient understand the  proposed surgical procedure and wishes to proceed.        Janautica Netzley K. 02/04/2013, 3:18 PM

## 2013-02-28 NOTE — Pre-Procedure Instructions (Signed)
Philip [name]  02/28/2013   Your procedure is scheduled on:  Wed, Oct 15 @ 8:30 AM  Report to [facility] at 6:30 AM.  Call this number if you have problems the morning of surgery: [phone]   Remember:   Do not eat food or drink liquids after midnight.   Take these medicines the morning of surgery with A SIP OF WATER: Amlodipine(Norvasc),Clonidine(Catapres),Hydralazine(Apresoline),Pain Pill(if needed),Metoprolol(Toprol),Zofran(Ondansetron-if needed),Dilantin(Phenytoin),and Topamax(Topiramate)               Stop taking your Aspirin and Fish Oil.No Goody's,BC's,Aleve,Ibuprofen,or any Herbal Medications                Do not wear jewelry  Do not wear lotions, powders, or colognes. You may wear deodorant.  Men may shave face and neck.  Do not bring valuables to the hospital.  [facility] is not responsible                  for any belongings or valuables.               Contacts, dentures or bridgework may not be worn into surgery.  Leave suitcase in the car. After surgery it may be brought to your room.  For patients admitted to the hospital, discharge time is determined by your                treatment team.               Patients discharged the day of surgery will not be allowed to drive  home.    Special Instructions: Shower using CHG 2 nights before surgery and the night before surgery.  If you shower the day of surgery use CHG.  Use special wash - you have one bottle of CHG for all showers.  You should use approximately 1/3 of the bottle for each shower.   Please read over the following fact sheets that you were given: Pain Booklet, Coughing and Deep Breathing and Surgical Site Infection Prevention

## 2013-03-01 ENCOUNTER — Encounter (HOSPITAL_COMMUNITY): Payer: Self-pay

## 2013-03-01 ENCOUNTER — Encounter (HOSPITAL_COMMUNITY)
Admission: RE | Admit: 2013-03-01 | Discharge: 2013-03-01 | Disposition: A | Discharge status: Home / Self Care | Payer: Medicaid Other | Source: Ambulatory Visit | Attending: Surgery | Admitting: Surgery

## 2013-03-01 DIAGNOSIS — Z01818 Encounter for other preprocedural examination: Secondary | ICD-10-CM | POA: Insufficient documentation

## 2013-03-01 DIAGNOSIS — Z01812 Encounter for preprocedural laboratory examination: Secondary | ICD-10-CM | POA: Insufficient documentation

## 2013-03-01 HISTORY — DX: Headache: R51

## 2013-03-01 HISTORY — DX: Sleep apnea, unspecified: G47.30

## 2013-03-01 HISTORY — DX: Personal history of other diseases of the nervous system and sense organs: Z86.69

## 2013-03-01 HISTORY — DX: Nausea: R11.0

## 2013-03-01 LAB — CBC
Hemoglobin: 15.9 g/dL (ref 13.0–17.0)
MCH: 30.5 pg (ref 26.0–34.0)
MCHC: 34.6 g/dL (ref 30.0–36.0)
MCV: 88.1 fL (ref 78.0–100.0)
RBC: 5.22 MIL/uL (ref 4.22–5.81)

## 2013-03-01 LAB — BASIC METABOLIC PANEL
BUN: 12 mg/dL (ref 6–23)
CO2: 30 mEq/L (ref 19–32)
GFR calc non Af Amer: 65 mL/min — ABNORMAL LOW (ref >90)
Glucose, Bld: 135 mg/dL — ABNORMAL HIGH (ref 70–99)
Potassium: 3.5 mEq/L (ref 3.5–5.1)
Sodium: 138 mEq/L (ref 135–145)

## 2013-03-01 NOTE — Progress Notes (Signed)
No show for PAT-left message for a return call

## 2013-03-03 ENCOUNTER — Encounter (HOSPITAL_COMMUNITY): Payer: Self-pay | Admitting: Pharmacy Technician

## 2013-03-08 MED ORDER — CEFAZOLIN SODIUM-DEXTROSE 2-3 GM-% IV SOLR
2.0000 g | INTRAVENOUS | Status: AC
  Administered 2013-03-09: 2 g via INTRAVENOUS
  Filled 2013-03-08: qty 50

## 2013-03-09 ENCOUNTER — Ambulatory Visit (HOSPITAL_COMMUNITY): Payer: Medicaid Other

## 2013-03-09 ENCOUNTER — Ambulatory Visit (HOSPITAL_COMMUNITY)
Admission: RE | Admit: 2013-03-09 | Discharge: 2013-03-10 | Disposition: A | Discharge status: Home / Self Care | Payer: Medicaid Other | Source: Ambulatory Visit | Attending: Surgery | Admitting: Surgery

## 2013-03-09 ENCOUNTER — Encounter (HOSPITAL_COMMUNITY): Payer: Self-pay | Admitting: *Deleted

## 2013-03-09 ENCOUNTER — Encounter (HOSPITAL_COMMUNITY)
Admission: RE | Disposition: A | Discharge status: Home / Self Care | Payer: Self-pay | Source: Ambulatory Visit | Attending: Surgery

## 2013-03-09 ENCOUNTER — Encounter (HOSPITAL_COMMUNITY): Payer: Medicaid Other | Admitting: Anesthesiology

## 2013-03-09 ENCOUNTER — Ambulatory Visit (HOSPITAL_COMMUNITY): Payer: Medicaid Other | Admitting: Anesthesiology

## 2013-03-09 DIAGNOSIS — K8010 Calculus of gallbladder with chronic cholecystitis without obstruction: Secondary | ICD-10-CM

## 2013-03-09 DIAGNOSIS — K429 Umbilical hernia without obstruction or gangrene: Secondary | ICD-10-CM | POA: Insufficient documentation

## 2013-03-09 DIAGNOSIS — I10 Essential (primary) hypertension: Secondary | ICD-10-CM | POA: Insufficient documentation

## 2013-03-09 DIAGNOSIS — Z8249 Family history of ischemic heart disease and other diseases of the circulatory system: Secondary | ICD-10-CM | POA: Insufficient documentation

## 2013-03-09 DIAGNOSIS — F17200 Nicotine dependence, unspecified, uncomplicated: Secondary | ICD-10-CM | POA: Insufficient documentation

## 2013-03-09 DIAGNOSIS — K7689 Other specified diseases of liver: Secondary | ICD-10-CM | POA: Insufficient documentation

## 2013-03-09 DIAGNOSIS — Z8673 Personal history of transient ischemic attack (TIA), and cerebral infarction without residual deficits: Secondary | ICD-10-CM | POA: Insufficient documentation

## 2013-03-09 HISTORY — PX: CHOLECYSTECTOMY: SHX55

## 2013-03-09 HISTORY — PX: UMBILICAL HERNIA REPAIR: SHX196

## 2013-03-09 LAB — POCT I-STAT 4, (NA,K, GLUC, HGB,HCT)
Glucose, Bld: 129 mg/dL — ABNORMAL HIGH (ref 70–99)
HCT: 51 % (ref 39.0–52.0)
Hemoglobin: 17.3 g/dL — ABNORMAL HIGH (ref 13.0–17.0)
Potassium: 3.1 mEq/L — ABNORMAL LOW (ref 3.5–5.1)
Sodium: 136 mEq/L (ref 135–145)

## 2013-03-09 SURGERY — LAPAROSCOPIC CHOLECYSTECTOMY WITH INTRAOPERATIVE CHOLANGIOGRAM
Anesthesia: General | Wound class: Contaminated

## 2013-03-09 MED ORDER — GLYCOPYRROLATE 0.2 MG/ML IJ SOLN
INTRAMUSCULAR | Status: DC | PRN
  Administered 2013-03-09: .7 mg via INTRAVENOUS

## 2013-03-09 MED ORDER — BUPIVACAINE-EPINEPHRINE PF 0.5-1:200000 % IJ SOLN
INTRAMUSCULAR | Status: AC
  Filled 2013-03-09: qty 30

## 2013-03-09 MED ORDER — ONDANSETRON HCL 4 MG/2ML IJ SOLN: 4.0000 mg | Freq: Four times a day (QID) | INTRAMUSCULAR | Status: DC | PRN

## 2013-03-09 MED ORDER — METOPROLOL SUCCINATE ER 50 MG PO TB24: 50.0000 mg | ORAL_TABLET | Freq: Every day | ORAL | Status: DC

## 2013-03-09 MED ORDER — OXYCODONE HCL 5 MG PO TABS: 5.0000 mg | ORAL_TABLET | Freq: Once | ORAL | Status: DC | PRN

## 2013-03-09 MED ORDER — SODIUM CHLORIDE 0.9 % IV SOLN
INTRAVENOUS | Status: DC | PRN
  Administered 2013-03-09: 09:00:00

## 2013-03-09 MED ORDER — POTASSIUM CHLORIDE 10 MEQ/100ML IV SOLN
INTRAVENOUS | Status: DC | PRN
  Administered 2013-03-09 (×2): 10 meq via INTRAVENOUS

## 2013-03-09 MED ORDER — LABETALOL HCL 5 MG/ML IV SOLN
20.0000 mg | Freq: Once | INTRAVENOUS | Status: AC
  Administered 2013-03-09: 20 mg via INTRAVENOUS

## 2013-03-09 MED ORDER — BUPIVACAINE-EPINEPHRINE PF 0.25-1:200000 % IJ SOLN
INTRAMUSCULAR | Status: AC
  Filled 2013-03-09: qty 30

## 2013-03-09 MED ORDER — METOPROLOL SUCCINATE ER 50 MG PO TB24
50.0000 mg | ORAL_TABLET | Freq: Every day | ORAL | Status: DC
  Administered 2013-03-09 – 2013-03-10 (×2): 50 mg via ORAL
  Filled 2013-03-09 (×2): qty 1

## 2013-03-09 MED ORDER — HYDROMORPHONE HCL PF 1 MG/ML IJ SOLN
INTRAMUSCULAR | Status: AC
  Administered 2013-03-09: 0.5 mg
  Filled 2013-03-09: qty 1

## 2013-03-09 MED ORDER — HYDRALAZINE HCL 25 MG PO TABS
25.0000 mg | ORAL_TABLET | Freq: Every day | ORAL | Status: DC
  Administered 2013-03-09 – 2013-03-10 (×2): 25 mg via ORAL
  Filled 2013-03-09 (×2): qty 1

## 2013-03-09 MED ORDER — PHENYTOIN SODIUM EXTENDED 100 MG PO CAPS
100.0000 mg | ORAL_CAPSULE | Freq: Three times a day (TID) | ORAL | Status: DC
  Administered 2013-03-09 – 2013-03-10 (×3): 100 mg via ORAL
  Filled 2013-03-09 (×5): qty 1

## 2013-03-09 MED ORDER — FENTANYL CITRATE 0.05 MG/ML IJ SOLN
INTRAMUSCULAR | Status: DC | PRN
  Administered 2013-03-09: 50 ug via INTRAVENOUS
  Administered 2013-03-09: 100 ug via INTRAVENOUS
  Administered 2013-03-09 (×2): 50 ug via INTRAVENOUS

## 2013-03-09 MED ORDER — ONDANSETRON HCL 4 MG/2ML IJ SOLN
INTRAMUSCULAR | Status: DC | PRN
  Administered 2013-03-09: 4 mg via INTRAMUSCULAR

## 2013-03-09 MED ORDER — NEOSTIGMINE METHYLSULFATE 1 MG/ML IJ SOLN
INTRAMUSCULAR | Status: DC | PRN
  Administered 2013-03-09: 4 mg via INTRAVENOUS

## 2013-03-09 MED ORDER — TRIAMTERENE-HCTZ 37.5-25 MG PO TABS
1.0000 | ORAL_TABLET | Freq: Every day | ORAL | Status: DC
  Administered 2013-03-09 – 2013-03-10 (×2): 1 via ORAL
  Filled 2013-03-09 (×2): qty 1

## 2013-03-09 MED ORDER — FUROSEMIDE 40 MG PO TABS
40.0000 mg | ORAL_TABLET | Freq: Every day | ORAL | Status: DC
  Administered 2013-03-09 – 2013-03-10 (×2): 40 mg via ORAL
  Filled 2013-03-09 (×2): qty 1

## 2013-03-09 MED ORDER — IBUPROFEN 600 MG PO TABS: 600.0000 mg | ORAL_TABLET | Freq: Four times a day (QID) | ORAL | Status: DC | PRN

## 2013-03-09 MED ORDER — POTASSIUM CHLORIDE ER 10 MEQ PO TBCR
10.0000 meq | EXTENDED_RELEASE_TABLET | Freq: Two times a day (BID) | ORAL | Status: DC
  Administered 2013-03-09 – 2013-03-10 (×3): 10 meq via ORAL
  Filled 2013-03-09 (×4): qty 1

## 2013-03-09 MED ORDER — LABETALOL HCL 5 MG/ML IV SOLN
INTRAVENOUS | Status: AC
  Filled 2013-03-09: qty 4

## 2013-03-09 MED ORDER — HYDROMORPHONE HCL PF 1 MG/ML IJ SOLN
0.2500 mg | INTRAMUSCULAR | Status: DC | PRN
  Administered 2013-03-09: 0.5 mg via INTRAVENOUS

## 2013-03-09 MED ORDER — OXYCODONE HCL 5 MG/5ML PO SOLN: 5.0000 mg | Freq: Once | ORAL | Status: DC | PRN

## 2013-03-09 MED ORDER — TOPIRAMATE 25 MG PO TABS
25.0000 mg | ORAL_TABLET | Freq: Two times a day (BID) | ORAL | Status: DC
  Administered 2013-03-09 – 2013-03-10 (×3): 25 mg via ORAL
  Filled 2013-03-09 (×4): qty 1

## 2013-03-09 MED ORDER — MIDAZOLAM HCL 2 MG/2ML IJ SOLN: 1.0000 mg | INTRAMUSCULAR | Status: DC | PRN

## 2013-03-09 MED ORDER — BUPIVACAINE HCL (PF) 0.25 % IJ SOLN
INTRAMUSCULAR | Status: AC
  Filled 2013-03-09: qty 30

## 2013-03-09 MED ORDER — POTASSIUM CHLORIDE IN NACL 40-0.9 MEQ/L-% IV SOLN
INTRAVENOUS | Status: DC
  Administered 2013-03-09 (×2): via INTRAVENOUS
  Filled 2013-03-09 (×4): qty 1000

## 2013-03-09 MED ORDER — FENTANYL CITRATE 0.05 MG/ML IJ SOLN: 50.0000 ug | Freq: Once | INTRAMUSCULAR | Status: DC

## 2013-03-09 MED ORDER — HYDROMORPHONE HCL PF 1 MG/ML IJ SOLN: 1.0000 mg | INTRAMUSCULAR | Status: DC | PRN

## 2013-03-09 MED ORDER — PROPOFOL 10 MG/ML IV BOLUS
INTRAVENOUS | Status: DC | PRN
  Administered 2013-03-09: 200 mg via INTRAVENOUS

## 2013-03-09 MED ORDER — CLONIDINE HCL 0.2 MG PO TABS
0.2000 mg | ORAL_TABLET | Freq: Two times a day (BID) | ORAL | Status: DC
  Administered 2013-03-09 – 2013-03-10 (×2): 0.2 mg via ORAL
  Filled 2013-03-09 (×3): qty 1

## 2013-03-09 MED ORDER — ROCURONIUM BROMIDE 100 MG/10ML IV SOLN
INTRAVENOUS | Status: DC | PRN
  Administered 2013-03-09: 10 mg via INTRAVENOUS
  Administered 2013-03-09: 50 mg via INTRAVENOUS

## 2013-03-09 MED ORDER — OXYCODONE-ACETAMINOPHEN 5-325 MG PO TABS
1.0000 | ORAL_TABLET | ORAL | Status: DC | PRN
  Administered 2013-03-09 – 2013-03-10 (×3): 2 via ORAL
  Filled 2013-03-09 (×3): qty 2

## 2013-03-09 MED ORDER — PROMETHAZINE HCL 25 MG/ML IJ SOLN: 6.2500 mg | INTRAMUSCULAR | Status: DC | PRN

## 2013-03-09 MED ORDER — LIDOCAINE HCL 1 % IJ SOLN
INTRAMUSCULAR | Status: DC | PRN
  Administered 2013-03-09: 09:00:00

## 2013-03-09 MED ORDER — LACTATED RINGERS IV SOLN
INTRAVENOUS | Status: DC | PRN
  Administered 2013-03-09 (×2): via INTRAVENOUS

## 2013-03-09 MED ORDER — POTASSIUM CHLORIDE 10 MEQ/100ML IV SOLN
10.0000 meq | INTRAVENOUS | Status: DC
  Filled 2013-03-09: qty 100

## 2013-03-09 MED ORDER — ONDANSETRON HCL 4 MG PO TABS: 4.0000 mg | ORAL_TABLET | Freq: Four times a day (QID) | ORAL | Status: DC | PRN

## 2013-03-09 MED ORDER — LIDOCAINE HCL (CARDIAC) 20 MG/ML IV SOLN
INTRAVENOUS | Status: DC | PRN
  Administered 2013-03-09: 50 mg via INTRAVENOUS

## 2013-03-09 MED ORDER — MIDAZOLAM HCL 5 MG/5ML IJ SOLN
INTRAMUSCULAR | Status: DC | PRN
  Administered 2013-03-09: 2 mg via INTRAVENOUS

## 2013-03-09 MED ORDER — SODIUM CHLORIDE 0.9 % IR SOLN
Status: DC | PRN
  Administered 2013-03-09: 1000 mL

## 2013-03-09 MED ORDER — AMLODIPINE BESYLATE 10 MG PO TABS
10.0000 mg | ORAL_TABLET | Freq: Every day | ORAL | Status: DC
  Administered 2013-03-09 – 2013-03-10 (×2): 10 mg via ORAL
  Filled 2013-03-09 (×2): qty 1

## 2013-03-09 MED ORDER — WHITE PETROLATUM GEL
Status: AC
  Administered 2013-03-09: 0.2
  Filled 2013-03-09: qty 5

## 2013-03-09 MED ORDER — CHLORHEXIDINE GLUCONATE 4 % EX LIQD: 1.0000 "application " | Freq: Once | CUTANEOUS | Status: DC

## 2013-03-09 SURGICAL SUPPLY — 68 items
APL SKNCLS STERI-STRIP NONHPOA (GAUZE/BANDAGES/DRESSINGS) ×1
APPLIER CLIP ROT 10 11.4 M/L (STAPLE) ×2
APR CLP MED LRG 11.4X10 (STAPLE) ×1
BAG SPEC RTRVL LRG 6X4 10 (ENDOMECHANICALS) ×1
BENZOIN TINCTURE PRP APPL 2/3 (GAUZE/BANDAGES/DRESSINGS) ×2 IMPLANT
BLADE SURG 15 STRL LF DISP TIS (BLADE) ×1 IMPLANT
BLADE SURG 15 STRL SS (BLADE) ×2
BLADE SURG ROTATE 9660 (MISCELLANEOUS) ×1 IMPLANT
CANISTER SUCTION 2500CC (MISCELLANEOUS) ×2 IMPLANT
CHLORAPREP W/TINT 26ML (MISCELLANEOUS) ×2 IMPLANT
CLIP APPLIE ROT 10 11.4 M/L (STAPLE) ×1 IMPLANT
COVER MAYO STAND STRL (DRAPES) ×2 IMPLANT
COVER SURGICAL LIGHT HANDLE (MISCELLANEOUS) ×2 IMPLANT
DECANTER SPIKE VIAL GLASS SM (MISCELLANEOUS) ×4 IMPLANT
DRAPE C-ARM 42X72 X-RAY (DRAPES) ×2 IMPLANT
DRAPE PED LAPAROTOMY (DRAPES) ×2 IMPLANT
DRAPE UTILITY 15X26 W/TAPE STR (DRAPE) ×4 IMPLANT
DRSG TEGADERM 2-3/8X2-3/4 SM (GAUZE/BANDAGES/DRESSINGS) ×6 IMPLANT
DRSG TEGADERM 4X4.75 (GAUZE/BANDAGES/DRESSINGS) ×2 IMPLANT
ELECT CAUTERY BLADE 6.4 (BLADE) ×2 IMPLANT
ELECT REM PT RETURN 9FT ADLT (ELECTROSURGICAL) ×2
ELECTRODE REM PT RTRN 9FT ADLT (ELECTROSURGICAL) ×1 IMPLANT
FILTER SMOKE EVAC LAPAROSHD (FILTER) ×2 IMPLANT
GAUZE SPONGE 2X2 8PLY STRL LF (GAUZE/BANDAGES/DRESSINGS) ×1 IMPLANT
GAUZE SPONGE 4X4 16PLY XRAY LF (GAUZE/BANDAGES/DRESSINGS) ×2 IMPLANT
GLOVE BIO SURGEON STRL SZ 6.5 (GLOVE) ×2 IMPLANT
GLOVE BIO SURGEON STRL SZ7 (GLOVE) ×2 IMPLANT
GLOVE BIO SURGEON STRL SZ7.5 (GLOVE) ×1 IMPLANT
GLOVE BIOGEL PI IND STRL 7.5 (GLOVE) ×1 IMPLANT
GLOVE BIOGEL PI INDICATOR 7.5 (GLOVE) ×3
GLOVE EUDERMIC 7 POWDERFREE (GLOVE) ×1 IMPLANT
GOWN STRL NON-REIN LRG LVL3 (GOWN DISPOSABLE) ×8 IMPLANT
KIT BASIN OR (CUSTOM PROCEDURE TRAY) ×2 IMPLANT
KIT ROOM TURNOVER OR (KITS) ×2 IMPLANT
NDL HYPO 25GX1X1/2 BEV (NEEDLE) ×1 IMPLANT
NEEDLE HYPO 25GX1X1/2 BEV (NEEDLE) ×2 IMPLANT
NS IRRIG 1000ML POUR BTL (IV SOLUTION) ×2 IMPLANT
PACK SURGICAL SETUP 50X90 (CUSTOM PROCEDURE TRAY) ×2 IMPLANT
PAD ARMBOARD 7.5X6 YLW CONV (MISCELLANEOUS) ×4 IMPLANT
PENCIL BUTTON HOLSTER BLD 10FT (ELECTRODE) ×2 IMPLANT
POUCH SPECIMEN RETRIEVAL 10MM (ENDOMECHANICALS) ×2 IMPLANT
SCISSORS LAP 5X35 DISP (ENDOMECHANICALS) IMPLANT
SET CHOLANGIOGRAPH 5 50 .035 (SET/KITS/TRAYS/PACK) ×2 IMPLANT
SET IRRIG TUBING LAPAROSCOPIC (IRRIGATION / IRRIGATOR) ×2 IMPLANT
SLEEVE ENDOPATH XCEL 5M (ENDOMECHANICALS) ×2 IMPLANT
SPECIMEN JAR SMALL (MISCELLANEOUS) ×2 IMPLANT
SPONGE GAUZE 2X2 STER 10/PKG (GAUZE/BANDAGES/DRESSINGS) ×1
SPONGE GAUZE 4X4 12PLY (GAUZE/BANDAGES/DRESSINGS) ×2 IMPLANT
SPONGE LAP 18X18 X RAY DECT (DISPOSABLE) ×2 IMPLANT
STRIP CLOSURE SKIN 1/2X4 (GAUZE/BANDAGES/DRESSINGS) ×2 IMPLANT
SUT MNCRL AB 4-0 PS2 18 (SUTURE) ×2 IMPLANT
SUT NOVA NAB GS-21 0 18 T12 DT (SUTURE) ×4 IMPLANT
SUT VIC AB 2-0 SH 27 (SUTURE) ×2
SUT VIC AB 2-0 SH 27XBRD (SUTURE) IMPLANT
SUT VIC AB 3-0 SH 27 (SUTURE) ×2
SUT VIC AB 3-0 SH 27X BRD (SUTURE) ×1 IMPLANT
SYR BULB 3OZ (MISCELLANEOUS) ×2 IMPLANT
SYR CONTROL 10ML LL (SYRINGE) ×2 IMPLANT
TOWEL OR 17X24 6PK STRL BLUE (TOWEL DISPOSABLE) ×2 IMPLANT
TOWEL OR 17X26 10 PK STRL BLUE (TOWEL DISPOSABLE) ×2 IMPLANT
TRAY LAPAROSCOPIC (CUSTOM PROCEDURE TRAY) ×2 IMPLANT
TROCAR XCEL BLUNT TIP 100MML (ENDOMECHANICALS) ×2 IMPLANT
TROCAR XCEL NON-BLD 11X100MML (ENDOMECHANICALS) ×2 IMPLANT
TROCAR XCEL NON-BLD 5MMX100MML (ENDOMECHANICALS) ×2 IMPLANT
TUBE CONNECTING 12X1/4 (SUCTIONS) IMPLANT
TUBING INSUFFLATION (TUBING) ×1 IMPLANT
WATER STERILE IRR 1000ML POUR (IV SOLUTION) IMPLANT
YANKAUER SUCT BULB TIP NO VENT (SUCTIONS) IMPLANT

## 2013-03-09 NOTE — Op Note (Signed)
Laparoscopic Cholecystectomy with IOC/ umbilical hernia repair Procedure Note  Indications: This patient presents with symptomatic gallbladder disease and will undergo laparoscopic cholecystectomy.  He also has an umbilical hernia and will have primary repair of the hernia.  Pre-operative Diagnosis: Calculus of gallbladder with other cholecystitis, without mention of obstruction     Umbilical hernia   Post-operative Diagnosis: Same  Surgeon: Satara Virella K.   Assistants: Dr. Chevis Pretty  Anesthesia: General endotracheal anesthesia  ASA Class: 2  Procedure Details  The patient was seen again in the Holding Room. The risks, benefits, complications, treatment options, and expected outcomes were discussed with the patient. The possibilities of reaction to medication, pulmonary aspiration, perforation of viscus, bleeding, recurrent infection, finding a normal gallbladder, the need for additional procedures, failure to diagnose a condition, the possible need to convert to an open procedure, and creating a complication requiring transfusion or operation were discussed with the patient. The likelihood of improving the patient's symptoms with return to their baseline status is good.  The patient and/or family concurred with the proposed plan, giving informed consent. The site of surgery properly noted. The patient was taken to Operating Room, identified as Philip [name] and the procedure verified as Laparoscopic Cholecystectomy with Intraoperative Cholangiogram and umbilical hernia repair. A Time Out was held and the above information confirmed.  Prior to the induction of general anesthesia, antibiotic prophylaxis was administered. General endotracheal anesthesia was then administered and tolerated well. After the induction, the abdomen was prepped with Chloraprep and draped in the sterile fashion. The patient was positioned in the supine position.  Local anesthetic agent was injected into the skin above  the umbilicus and an incision made. We dissected down to the umbilical hernia sac with blunt dissection.  The sac was opened and we entered the peritoneal cavity bluntly.  A pursestring suture of 0-Vicryl was placed around the fascial opening.  The Hasson cannula was inserted and secured with the stay suture.  Pneumoperitoneum was then created with CO2 and tolerated well without any adverse changes in the patient's vital signs. An 11-mm port was placed in the subxiphoid position.  Two 5-mm ports were placed in the right upper quadrant. All skin incisions were infiltrated with a local anesthetic agent before making the incision and placing the trocars.   We positioned the patient in reverse Trendelenburg, tilted slightly to the patient's left.  The gallbladder was identified, the fundus grasped and retracted cephalad. Adhesions were lysed bluntly and with the electrocautery where indicated, taking care not to injure any adjacent organs or viscus. The infundibulum was grasped and retracted laterally, exposing the peritoneum overlying the triangle of Calot. This was then divided and exposed in a blunt fashion. A critical view of the cystic duct and cystic artery was obtained.  The cystic duct was clearly identified and bluntly dissected circumferentially. The cystic duct was ligated with a clip distally.   An incision was made in the cystic duct and the [facility] cholangiogram catheter introduced. The catheter was secured using a clip. A cholangiogram was then obtained which showed good visualization of the distal and proximal biliary tree with no sign of filling defects or obstruction.  Contrast flowed easily into the duodenum. The catheter was then removed.   The cystic duct was then ligated with clips and divided. The cystic artery was identified, dissected free, ligated with clips and divided as well.   The gallbladder was dissected from the liver bed in retrograde fashion with the electrocautery. The gallbladder  was removed and placed in an Endocatch sac. The liver bed was irrigated and inspected. Hemostasis was achieved with the electrocautery. Copious irrigation was utilized and was repeatedly aspirated until clear.  The gallbladder and Endocatch sac were then removed through the umbilical port site.      We again inspected the right upper quadrant for hemostasis.  Pneumoperitoneum was released as we removed the trocars.   We dissected bluntly around the hernia sac down to the edge of the fascial defect.  We reduced the hernia sac back into the pre-peritoneal space.  The fascial defect measured 2 cm.  We cleared the fascia in all directions.  The fascial defect was closed with multiple interrupted figure-of-eight 0 Novofil sutures.  The base of the umbilicus was tacked down with 3-0 Vicryl.  3-0 Vicryl was used to close the subcutaneous tissues and 4-0 Monocryl was used to close the skin.  Steri-strips and clean dressing were applied.  The patient was extubated and brought to the recovery room in stable condition.  All sponge, instrument, and needle counts were correct prior to closure and at the conclusion of the case.    4-0 Monocryl was used to close the skin.   Benzoin, steri-strips, and clean dressings were applied. The patient was then extubated and brought to the recovery room in stable condition. Instrument, sponge, and needle counts were correct at closure and at the conclusion of the case.   Findings: Cholecystitis with Cholelithiasis 2 cm umbilical hernia defect  Estimated Blood Loss: Minimal         Drains: none         Specimens: Gallbladder           Complications: None; patient tolerated the procedure well.         Disposition: PACU - hemodynamically stable.         Condition: stable  [name], MD, [facility]  General/ Trauma Surgery  03/09/2013 10:12 AM

## 2013-03-09 NOTE — H&P (Signed)
HPI  Philip [name] is a 58 y.o. male. Referred by Dr. Modesto Charon  Abdominal Pain Associated symptoms: no chest pain, no chills, no constipation, no cough, no diarrhea, no fever, no hematuria, no nausea, no sore throat and no vomiting  This is a 57 yo male who recently went to the ED with severe epigastric pain after eating at [facility]. He has had previous milder episodes of RUQ abdominal pain. He denies any fever, nausea, vomiting, diarrhea. Ultrasound showed gallstones. He has been asymptomatic for the last few days.  Past Medical History   Diagnosis  Date   .  Hypertension    .  Seizures    .  Stroke    History reviewed. No pertinent past surgical history.  Family History   Problem  Relation  Age of Onset   .  Heart disease  Maternal Grandmother    .  Heart disease  Maternal Grandfather    .  Heart disease  Paternal Grandmother    .  Heart disease  Paternal Grandfather    .  Cancer  Cousin    .  Hypertension  Mother    Social History  History   Substance Use Topics   .  Smoking status:  Current Every Day Smoker -- 0.25 packs/day     Types:  Cigarettes     Start date:  05/26/1978   .  Smokeless tobacco:  Not on file   .  Alcohol Use:  0.5 oz/week     1 drink(s) per week   No Known Allergies  Current Outpatient Prescriptions   Medication  Sig  Dispense  Refill   .  amLODipine (NORVASC) 10 MG tablet  Take 10 mg by mouth daily.     Marland Kitchen  aspirin EC 81 MG tablet  Take 81 mg by mouth daily.     .  Cholecalciferol (VITAMIN D3) 50000 UNITS CAPS  Take 50,000 Units by mouth once a week.  4 capsule  4   .  cloNIDine (CATAPRES) 0.2 MG tablet  Take 0.2 mg by mouth 2 (two) times daily.     .  fish oil-omega-3 fatty acids 1000 MG capsule  Take 2 g by mouth daily.     .  furosemide (LASIX) 40 MG tablet  Take 1 tablet (40 mg total) by mouth daily.  30 tablet  3   .  hydrALAZINE (APRESOLINE) 25 MG tablet  Take 25 mg by mouth daily.     Marland Kitchen  HYDROcodone-acetaminophen (NORCO/VICODIN) 5-325 MG per tablet   Take 2 tablets by mouth every 4 (four) hours as needed for pain.  10 tablet  0   .  metoprolol succinate (TOPROL-XL) 50 MG 24 hr tablet  Take 50 mg by mouth daily. Take with or immediately following a meal.     .  ondansetron (ZOFRAN) 4 MG tablet  Take 1 tablet (4 mg total) by mouth every 6 (six) hours.  12 tablet  0   .  oxyCODONE-acetaminophen (PERCOCET/ROXICET) 5-325 MG per tablet  Take 2 tablets by mouth every 6 (six) hours as needed for pain.  20 tablet  0   .  phenytoin (DILANTIN) 100 MG ER capsule  Take 1 capsule (100 mg total) by mouth 3 (three) times daily.  90 capsule  2   .  potassium chloride (K-DUR) 10 MEQ tablet  Take 1 tablet (10 mEq total) by mouth 2 (two) times daily.  60 tablet  2   .  topiramate (  TOPAMAX) 25 MG tablet  Take 25 mg by mouth 2 (two) times daily.     Marland Kitchen  triamterene-hydrochlorothiazide (MAXZIDE-25) 37.5-25 MG per tablet  Take 1 tablet by mouth daily.      No current facility-administered medications for this visit.   Review of Systems  Review of Systems  Constitutional: Negative for fever, chills and unexpected weight change.  HENT: Negative for hearing loss, congestion, sore throat, trouble swallowing and voice change.  Eyes: Negative for visual disturbance.  Respiratory: Negative for cough and wheezing.  Cardiovascular: Negative for chest pain, palpitations and leg swelling.  Gastrointestinal: Positive for abdominal pain. Negative for nausea, vomiting, diarrhea, constipation, blood in stool, abdominal distention, anal bleeding and rectal pain.  Genitourinary: Negative for hematuria and difficulty urinating.  Musculoskeletal: Negative for arthralgias.  Skin: Negative for rash and wound.  Neurological: Negative for seizures, syncope, weakness and headaches.  Hematological: Negative for adenopathy. Does not bruise/bleed easily.  Psychiatric/Behavioral: Negative for confusion.  Blood pressure 126/77, pulse 62, temperature 98.1 F (36.7 C), temperature source  Temporal, resp. rate 12, height 5\' 7"  (1.702 m), weight 187 lb 12.8 oz (85.186 kg).  Physical Exam  Physical Exam  WDWN in NAD  HEENT: EOMI, sclera anicteric  Neck: No masses, no thyromegaly  Lungs: CTA bilaterally; normal respiratory effort  CV: Regular rate and rhythm; no murmurs  Abd: +bowel sounds, soft, non-tender, protruding umbilical hernia with 2 cm defect  Ext: Well-perfused; no edema  Skin: Warm, dry; no sign of jaundice  Data Reviewed  Lab Results   Component  Value  Date    WBC  5.9  01/31/2013    HGB  16.1  01/31/2013    HCT  44.3  01/31/2013    MCV  87.4  01/31/2013    PLT  120*  01/31/2013    Lab Results   Component  Value  Date    CREATININE  1.27  01/31/2013    BUN  11  01/31/2013    NA  133*  01/31/2013    K  2.9*  01/31/2013    CL  89*  01/31/2013    CO2  32  01/31/2013    Lab Results   Component  Value  Date    ALT  52  01/31/2013    AST  53*  01/31/2013    ALKPHOS  99  01/31/2013    BILITOT  0.3  01/31/2013   CLINICAL DATA: 57 year old male with abdominal pain, chest pain,  abnormal LFTs.  EXAM:  ABDOMEN ULTRASOUND  COMPARISON: 06/10/2011  FINDINGS:  Gallbladder  Contracted. Echogenic stones the identified (images 53 and 55). No  sonographic Murphy sign elicited. Gallbladder wall thickness cannot  be determined.  Common bile duct  Diameter: Normal, 5 mm diameter.  Chronically increased echogenicity. No intrahepatic ductal  dilatation or discrete liver lesion.  Liver  No focal lesion identified. Within normal limits in parenchymal  echogenicity.  IVC  No abnormality visualized.  Pancreas  Incompletely visualized due to overlying bowel gas, visualized  portions within normal limits.  Spleen  Size and appearance within normal limits.  Right Kidney  Length: 11.4 cm. Echogenicity within normal limits. No mass or  hydronephrosis visualized.  Left Kidney  Length: 10.1 cm. Echogenicity within normal limits. No mass or  hydronephrosis visualized.  Abdominal aorta   Incompletely visualized due to overlying bowel gas, visualized  portions within normal limits.  IMPRESSION:  1. Contracted gallbladder today. Chronic cholelithiasis. No strong  evidence of acute cholecystitis.  2. Chronic hepatic steatosis. No biliary ductal dilatation.  Electronically Signed  By: Augusto Gamble  On: 01/25/2013 16:38  Assessment  Chronic calculus cholecystitis  Umbilical hernia  Plan  Laparoscopic cholecystectomy with intraoperative cholangiogram/ open umbilical hernia repair. The surgical procedure has been discussed with the patient. Potential risks, benefits, alternative treatments, and expected outcomes have been explained. All of the patient's questions at this time have been answered. The likelihood of reaching the patient's treatment goal is good. The patient understand the proposed surgical procedure and wishes to proceed.  [name], MD, [facility]  General/ Trauma Surgery  03/09/2013 7:57 AM

## 2013-03-09 NOTE — Anesthesia Postprocedure Evaluation (Signed)
  Anesthesia Post-op Note  Patient: Philip [name]  Procedure(s) Performed: Procedure(s): LAPAROSCOPIC CHOLECYSTECTOMY WITH INTRAOPERATIVE CHOLANGIOGRAM (N/A) HERNIA REPAIR UMBILICAL ADULT (N/A)  Patient Location: PACU  Anesthesia Type:General  Level of Consciousness: awake and alert   Airway and Oxygen Therapy: Patient Spontanous Breathing  Post-op Pain: mild  Post-op Assessment: Post-op Vital signs reviewed, Patient's Cardiovascular Status Stable, Respiratory Function Stable, Patent Airway, No signs of Nausea or vomiting and Pain level controlled  Post-op Vital Signs: Reviewed and stable  Complications: No apparent anesthesia complications

## 2013-03-09 NOTE — Anesthesia Procedure Notes (Signed)
Procedure Name: Intubation Date/Time: 03/09/2013 8:41 AM Performed by: Gwenyth Allegra Pre-anesthesia Checklist: Patient identified, Timeout performed, Emergency Drugs available, Suction available and Patient being monitored Patient Re-evaluated:Patient Re-evaluated prior to inductionOxygen Delivery Method: Circle system utilized Preoxygenation: Pre-oxygenation with 100% oxygen Intubation Type: IV induction Ventilation: Mask ventilation without difficulty and Oral airway inserted - appropriate to patient size Laryngoscope Size: Mac and 4 Grade View: Grade I Tube type: Oral Tube size: 8.0 mm Airway Equipment and Method: Stylet Secured at: 22 cm Tube secured with: Tape Dental Injury: Teeth and Oropharynx as per pre-operative assessment

## 2013-03-09 NOTE — Preoperative (Signed)
Beta Blockers   Reason not to administer Beta Blockers:Not Applicable 

## 2013-03-09 NOTE — Anesthesia Preprocedure Evaluation (Signed)
Anesthesia Evaluation  Patient identified by MRN, date of birth, ID band Patient awake    Reviewed: Allergy & Precautions, H&P , NPO status , Patient's Chart, lab work & pertinent test results  Airway Mallampati: II TM Distance: >3 FB Neck ROM: Full    Dental   Pulmonary asthma , sleep apnea , COPDCurrent Smoker,  + rhonchi         Cardiovascular hypertension, Rhythm:Regular Rate:Normal     Neuro/Psych  Headaches, Seizures -,  CVA    GI/Hepatic   Endo/Other    Renal/GU      Musculoskeletal   Abdominal   Peds  Hematology   Anesthesia Other Findings   Reproductive/Obstetrics                           Anesthesia Physical Anesthesia Plan  ASA: III  Anesthesia Plan: General   Post-op Pain Management:    Induction: Intravenous  Airway Management Planned: Oral ETT  Additional Equipment:   Intra-op Plan:   Post-operative Plan: Extubation in OR  Informed Consent: I have reviewed the patients History and Physical, chart, labs and discussed the procedure including the risks, benefits and alternatives for the proposed anesthesia with the patient or authorized representative who has indicated his/her understanding and acceptance.     Plan Discussed with: CRNA and Surgeon  Anesthesia Plan Comments: (Will need K recheck  AM of surgery )        Anesthesia Quick Evaluation

## 2013-03-09 NOTE — Transfer of Care (Signed)
Immediate Anesthesia Transfer of Care Note  Patient: Philip [name]  Procedure(s) Performed: Procedure(s): LAPAROSCOPIC CHOLECYSTECTOMY WITH INTRAOPERATIVE CHOLANGIOGRAM (N/A) HERNIA REPAIR UMBILICAL ADULT (N/A)  Patient Location: PACU  Anesthesia Type:General  Level of Consciousness: sedated  Airway & Oxygen Therapy: Patient Spontanous Breathing and Patient connected to nasal cannula oxygen  Post-op Assessment: Report given to PACU RN and Post -op Vital signs reviewed and stable  Post vital signs: Reviewed and stable  Complications: No apparent anesthesia complications

## 2013-03-10 MED ORDER — HYDROCODONE-ACETAMINOPHEN 5-325 MG PO TABS: 2.0000 | ORAL_TABLET | ORAL | Status: DC | PRN

## 2013-03-10 NOTE — Progress Notes (Signed)
1 Day Post-Op  Subjective: POD#1 Doing well.  Tolerating po.  Pain controlled  Objective: Vital signs in last 24 hours: Temp:  [97.4 F (36.3 C)-99 F (37.2 C)] 98.9 F (37.2 C) (10/16 0557) Pulse Rate:  [61-78] 78 (10/16 0557) Resp:  [12-20] 20 (10/16 0557) BP: (135-173)/(89-117) 135/92 mmHg (10/16 0557) SpO2:  [90 %-100 %] 97 % (10/16 0557) Weight:  [190 lb 4.1 oz (86.3 kg)] 190 lb 4.1 oz (86.3 kg) (10/15 2130) Last BM Date: 03/09/13 (prior to surgery)  Intake/Output from previous day: 10/15 0701 - 10/16 0700 In: 2690 [I.V.:2690] Out: 2075 [Urine:2075] Intake/Output this shift:    Lungs clear Abdomen soft, dressings dry  Lab Results:   Recent Labs  03/09/13 0814  HGB 17.3*  HCT 51.0   BMET  Recent Labs  03/09/13 0814  NA 136  K 3.1*  GLUCOSE 129*   PT/INR No results found for this basename: LABPROT, INR,  in the last 72 hours ABG No results found for this basename: PHART, PCO2, PO2, HCO3,  in the last 72 hours  Studies/Results: Dg Cholangiogram Operative  03/09/2013   CLINICAL DATA:  Intraoperative cholangiogram, cholelithiasis, cholecystectomy  EXAM: INTRAOPERATIVE CHOLANGIOGRAM  TECHNIQUE: Cholangiographic images from the C-arm fluoroscopic device were submitted for interpretation post-operatively. Please see the procedural report for the amount of contrast and the fluoroscopy time utilized.  COMPARISON:  Ultrasound abdomen 01/25/2013  FINDINGS: Examination interpreted postoperatively.  Normal caliber CBD and common hepatic duct.  Patent CBD with free spillage of contrast into duodenum.  Visualize intrahepatic radicles normal appearance.  No evidence of stricture or significant biliary wall irregularity.  No intraluminal filling defects identified to suggest choledocholithiasis.  IMPRESSION: Normal intraoperative cholangiogram.   Electronically Signed   By: Ulyses Southward M.D.   On: 03/09/2013 16:43    Anti-infectives: Anti-infectives   Start     Dose/Rate  Route Frequency Ordered Stop   03/09/13 0600  ceFAZolin (ANCEF) IVPB 2 g/50 mL premix     2 g 100 mL/hr over 30 Minutes Intravenous On call to O.R. 03/08/13 1417 03/09/13 0830      Assessment/Plan: s/p Procedure(s): LAPAROSCOPIC CHOLECYSTECTOMY WITH INTRAOPERATIVE CHOLANGIOGRAM (N/[name]) HERNIA REPAIR UMBILICAL ADULT (N/[name])  Discharge home  LOS: 1 day    Philip [name] 03/10/2013

## 2013-03-10 NOTE — Discharge Summary (Signed)
Physician Discharge Summary  Patient ID: Philip [name] MRN: [id number] DOB/AGE: [birth year] 57 y.o.  Admit date: 03/09/2013 Discharge date: 03/10/2013  Admission Diagnoses:  Chronic calculus cholecystitis      Umbilical hernia     Obstructive sleep apnea  Discharge Diagnoses:  Same Active Problems:   * No active hospital problems. *   Discharged Condition:  Improved  Hospital Course: He underwent laparoscopic cholecystectomy with intraoperative cholangiogram as well as umbilical hernia repair.  He was kept overnight due to his sleep apnea.  He did well and was discharged home on POD #1.   Consults: none  Significant Diagnostic Studies:  none  Treatments: Surgery - as above.  Discharge Exam: Blood pressure 133/101, pulse 77, temperature 98.9 F (37.2 C), temperature source Oral, resp. rate 20, height 5\' 9"  (1.753 m), weight 190 lb 4.1 oz (86.3 kg), SpO2 97.00%. Incisions c/d/i Some abdominal soreness around incisions  Disposition: 01-Home or Self Care   Future Appointments Provider Department Dept Phone   03/29/2013 10:40 AM [name], MD [facility], [place] [phone]   05/05/2013 3:00 PM Ileana Ladd, MD [facility] [phone]       Medication List         amLODipine 10 MG tablet  Commonly known as:  NORVASC  Take 10 mg by mouth daily.     aspirin EC 81 MG tablet  Take 81 mg by mouth daily.     cloNIDine 0.2 MG tablet  Commonly known as:  CATAPRES  Take 0.2 mg by mouth 2 (two) times daily.     fish oil-omega-3 fatty acids 1000 MG capsule  Take 2 g by mouth daily.     furosemide 40 MG tablet  Commonly known as:  LASIX  Take 1 tablet (40 mg total) by mouth daily.     hydrALAZINE 25 MG tablet  Commonly known as:  APRESOLINE  Take 25 mg by mouth daily.     HYDROcodone-acetaminophen 5-325 MG per tablet  Commonly known as:  NORCO/VICODIN  Take 2 tablets by mouth every 4 (four) hours as needed for pain.     metoprolol succinate 50 MG 24 hr tablet  Commonly known as:  TOPROL-XL  Take 50 mg by mouth daily. Take with or immediately following a meal.     ondansetron 4 MG tablet  Commonly known as:  ZOFRAN  Take 1 tablet (4 mg total) by mouth every 6 (six) hours.     oxyCODONE-acetaminophen 5-325 MG per tablet  Commonly known as:  PERCOCET/ROXICET  Take 2 tablets by mouth every 6 (six) hours as needed for pain.     phenytoin 100 MG ER capsule  Commonly known as:  DILANTIN  Take 1 capsule (100 mg total) by mouth 3 (three) times daily.     potassium chloride 10 MEQ tablet  Commonly known as:  K-DUR  Take 1 tablet (10 mEq total) by mouth 2 (two) times daily.     topiramate 25 MG tablet  Commonly known as:  TOPAMAX  Take 25 mg by mouth 2 (two) times daily.     triamterene-hydrochlorothiazide 37.5-25 MG per tablet  Commonly known as:  MAXZIDE-25  Take 1 tablet by mouth daily.     Vitamin D3 50000 UNITS Caps  Take 50,000 Units by mouth once a week.           Follow-up Information   Follow up with Wynona Luna., MD. Schedule an appointment as soon as possible for a  visit in 3 weeks.   Specialty:  General Surgery   Contact information:   [address] [place] [address] [phone]       Signed: Wynona Luna. 03/10/2013, 11:04 PM

## 2013-03-10 NOTE — Progress Notes (Signed)
NURSING PROGRESS NOTE  Philip [name] [id number] Discharge Data: 03/10/2013 11:11 AM Attending Provider: [name], MD NWG:[id number] [name], MD     [name] to be D/C'd Home per MD order.  Discussed with the patient the After Visit Summary and all questions fully answered. All IV's discontinued with no bleeding noted. All belongings returned to patient for patient to take home. Instructions on how to care for his incision sites. Patient and sister verbalized having an understanding of the instructions and did not have any questions. Patient was given a prescription for pain medication. He was instructed not to drive while taking the pain medication.   Last Vital Signs:  Blood pressure 133/101, pulse 77, temperature 98.9 F (37.2 C), temperature source Oral, resp. rate 20, height 5\' 9"  (1.753 m), weight 86.3 kg (190 lb 4.1 oz), SpO2 97.00%.  Discharge Medication List   Medication List         amLODipine 10 MG tablet  Commonly known as:  NORVASC  Take 10 mg by mouth daily.     aspirin EC 81 MG tablet  Take 81 mg by mouth daily.     cloNIDine 0.2 MG tablet  Commonly known as:  CATAPRES  Take 0.2 mg by mouth 2 (two) times daily.     fish oil-omega-3 fatty acids 1000 MG capsule  Take 2 g by mouth daily.     furosemide 40 MG tablet  Commonly known as:  LASIX  Take 1 tablet (40 mg total) by mouth daily.     hydrALAZINE 25 MG tablet  Commonly known as:  APRESOLINE  Take 25 mg by mouth daily.     HYDROcodone-acetaminophen 5-325 MG per tablet  Commonly known as:  NORCO/VICODIN  Take 2 tablets by mouth every 4 (four) hours as needed for pain.     metoprolol succinate 50 MG 24 hr tablet  Commonly known as:  TOPROL-XL  Take 50 mg by mouth daily. Take with or immediately following a meal.     ondansetron 4 MG tablet  Commonly known as:  ZOFRAN  Take 1 tablet (4 mg total) by mouth every 6 (six) hours.     oxyCODONE-acetaminophen 5-325 MG per tablet  Commonly known  as:  PERCOCET/ROXICET  Take 2 tablets by mouth every 6 (six) hours as needed for pain.     phenytoin 100 MG ER capsule  Commonly known as:  DILANTIN  Take 1 capsule (100 mg total) by mouth 3 (three) times daily.     potassium chloride 10 MEQ tablet  Commonly known as:  K-DUR  Take 1 tablet (10 mEq total) by mouth 2 (two) times daily.     topiramate 25 MG tablet  Commonly known as:  TOPAMAX  Take 25 mg by mouth 2 (two) times daily.     triamterene-hydrochlorothiazide 37.5-25 MG per tablet  Commonly known as:  MAXZIDE-25  Take 1 tablet by mouth daily.     Vitamin D3 50000 UNITS Caps  Take 50,000 Units by mouth once a week.

## 2013-03-11 ENCOUNTER — Encounter (HOSPITAL_COMMUNITY): Payer: Self-pay | Admitting: Surgery

## 2013-03-14 ENCOUNTER — Other Ambulatory Visit: Payer: Self-pay

## 2013-03-14 MED ORDER — AMLODIPINE BESYLATE 10 MG PO TABS: 10.0000 mg | ORAL_TABLET | Freq: Every day | ORAL | Status: DC

## 2013-03-29 ENCOUNTER — Telehealth (INDEPENDENT_AMBULATORY_CARE_PROVIDER_SITE_OTHER): Payer: Self-pay | Admitting: General Surgery

## 2013-03-29 ENCOUNTER — Ambulatory Visit (INDEPENDENT_AMBULATORY_CARE_PROVIDER_SITE_OTHER): Payer: Medicaid Other | Admitting: Surgery

## 2013-03-29 ENCOUNTER — Encounter (INDEPENDENT_AMBULATORY_CARE_PROVIDER_SITE_OTHER): Payer: Self-pay | Admitting: Surgery

## 2013-03-29 ENCOUNTER — Encounter (INDEPENDENT_AMBULATORY_CARE_PROVIDER_SITE_OTHER): Payer: Self-pay

## 2013-03-29 ENCOUNTER — Ambulatory Visit: Payer: Medicaid Other | Admitting: Family Medicine

## 2013-03-29 VITALS — BP 138/76 | HR 68 | Resp 16 | Ht 67.0 in | Wt 188.2 lb

## 2013-03-29 DIAGNOSIS — K8010 Calculus of gallbladder with chronic cholecystitis without obstruction: Secondary | ICD-10-CM

## 2013-03-29 DIAGNOSIS — K429 Umbilical hernia without obstruction or gangrene: Secondary | ICD-10-CM

## 2013-03-29 MED ORDER — HYDROCODONE-ACETAMINOPHEN 5-325 MG PO TABS: 1.0000 | ORAL_TABLET | ORAL | Status: DC | PRN

## 2013-03-29 NOTE — Telephone Encounter (Signed)
LMOM for patient to let him know that he has a Rx for Norco/vicodin 5/325. 1 tab by  Month every 4 hrs prn for pain

## 2013-03-29 NOTE — Progress Notes (Signed)
Status post laparoscopic cholecystectomy with intraoperative cholangiogram as well as umbilical hernia repair on 03/09/13. The patient is doing quite well. Abdominal pain has resolved. Initially did have some diarrhea but this has also resolved. His incisions are all well-healed with no sign of infection. No sign of recurrent hernia. He has some firm scar tissue under his umbilicus but this is getting smaller. He may resume full activity. Followup as needed.  [name], MD, [facility] Surgery  General/ Trauma Surgery  03/29/2013 11:36 AM

## 2013-04-18 ENCOUNTER — Encounter: Payer: Self-pay | Admitting: *Deleted

## 2013-05-05 ENCOUNTER — Ambulatory Visit: Payer: Medicaid Other | Admitting: Family Medicine

## 2013-05-09 ENCOUNTER — Ambulatory Visit: Payer: Medicaid Other | Admitting: Family Medicine

## 2013-05-11 ENCOUNTER — Other Ambulatory Visit: Payer: Self-pay | Admitting: Family Medicine

## 2013-05-24 ENCOUNTER — Encounter: Payer: Self-pay | Admitting: Family Medicine

## 2013-05-24 ENCOUNTER — Ambulatory Visit (INDEPENDENT_AMBULATORY_CARE_PROVIDER_SITE_OTHER): Payer: Medicaid Other | Admitting: Family Medicine

## 2013-05-24 VITALS — BP 116/82 | HR 69 | Temp 98.3°F | Ht 67.0 in | Wt 191.4 lb

## 2013-05-24 DIAGNOSIS — I10 Essential (primary) hypertension: Secondary | ICD-10-CM

## 2013-05-24 DIAGNOSIS — G4733 Obstructive sleep apnea (adult) (pediatric): Secondary | ICD-10-CM

## 2013-05-24 DIAGNOSIS — Z8669 Personal history of other diseases of the nervous system and sense organs: Secondary | ICD-10-CM

## 2013-05-24 DIAGNOSIS — I6350 Cerebral infarction due to unspecified occlusion or stenosis of unspecified cerebral artery: Secondary | ICD-10-CM

## 2013-05-24 DIAGNOSIS — F17200 Nicotine dependence, unspecified, uncomplicated: Secondary | ICD-10-CM

## 2013-05-24 DIAGNOSIS — E785 Hyperlipidemia, unspecified: Secondary | ICD-10-CM

## 2013-05-24 DIAGNOSIS — N529 Male erectile dysfunction, unspecified: Secondary | ICD-10-CM

## 2013-05-24 DIAGNOSIS — Z720 Tobacco use: Secondary | ICD-10-CM | POA: Insufficient documentation

## 2013-05-24 DIAGNOSIS — I639 Cerebral infarction, unspecified: Secondary | ICD-10-CM

## 2013-05-24 NOTE — Patient Instructions (Signed)
Smoking Cessation Quitting smoking is important to your health and has many advantages. However, it is not always easy to quit since nicotine is a very addictive drug. Often times, people try 3 times or more before being able to quit. This document explains the best ways for you to prepare to quit smoking. Quitting takes hard work and a lot of effort, but you can do it. ADVANTAGES OF QUITTING SMOKING  You will live longer, feel better, and live better.  Your body will feel the impact of quitting smoking almost immediately.  Within 20 minutes, blood pressure decreases. Your pulse returns to its normal level.  After 8 hours, carbon monoxide levels in the blood return to normal. Your oxygen level increases.  After 24 hours, the chance of having a heart attack starts to decrease. Your breath, hair, and body stop smelling like smoke.  After 48 hours, damaged nerve endings begin to recover. Your sense of taste and smell improve.  After 72 hours, the body is virtually free of nicotine. Your bronchial tubes relax and breathing becomes easier.  After 2 to 12 weeks, lungs can hold more air. Exercise becomes easier and circulation improves.  The risk of having a heart attack, stroke, cancer, or lung disease is greatly reduced.  After 1 year, the risk of coronary heart disease is cut in half.  After 5 years, the risk of stroke falls to the same as a nonsmoker.  After 10 years, the risk of lung cancer is cut in half and the risk of other cancers decreases significantly.  After 15 years, the risk of coronary heart disease drops, usually to the level of a nonsmoker.  If you are pregnant, quitting smoking will improve your chances of having a healthy baby.  The people you live with, especially any children, will be healthier.  You will have extra money to spend on things other than cigarettes. QUESTIONS TO THINK ABOUT BEFORE ATTEMPTING TO QUIT You may want to talk about your answers with your  caregiver.  Why do you want to quit?  If you tried to quit in the past, what helped and what did not?  What will be the most difficult situations for you after you quit? How will you plan to handle them?  Who can help you through the tough times? Your family? Friends? A caregiver?  What pleasures do you get from smoking? What ways can you still get pleasure if you quit? Here are some questions to ask your caregiver:  How can you help me to be successful at quitting?  What medicine do you think would be best for me and how should I take it?  What should I do if I need more help?  What is smoking withdrawal like? How can I get information on withdrawal? GET READY  Set a quit date.  Change your environment by getting rid of all cigarettes, ashtrays, matches, and lighters in your home, car, or work. Do not let people smoke in your home.  Review your past attempts to quit. Think about what worked and what did not. GET SUPPORT AND ENCOURAGEMENT You have a better chance of being successful if you have help. You can get support in many ways.  Tell your family, friends, and co-workers that you are going to quit and need their support. Ask them not to smoke around you.  Get individual, group, or telephone counseling and support. Programs are available at local hospitals and health centers. Call your local health department for   information about programs in your area.  Spiritual beliefs and practices may help some smokers quit.  Download a "quit meter" on your computer to keep track of quit statistics, such as how long you have gone without smoking, cigarettes not smoked, and money saved.  Get a self-help book about quitting smoking and staying off of tobacco. LEARN NEW SKILLS AND BEHAVIORS  Distract yourself from urges to smoke. Talk to someone, go for a walk, or occupy your time with a task.  Change your normal routine. Take a different route to work. Drink tea instead of coffee.  Eat breakfast in a different place.  Reduce your stress. Take a hot bath, exercise, or read a book.  Plan something enjoyable to do every day. Reward yourself for not smoking.  Explore interactive web-based programs that specialize in helping you quit. GET MEDICINE AND USE IT CORRECTLY Medicines can help you stop smoking and decrease the urge to smoke. Combining medicine with the above behavioral methods and support can greatly increase your chances of successfully quitting smoking.  Nicotine replacement therapy helps deliver nicotine to your body without the negative effects and risks of smoking. Nicotine replacement therapy includes nicotine gum, lozenges, inhalers, nasal sprays, and skin patches. Some may be available over-the-counter and others require a prescription.  Antidepressant medicine helps people abstain from smoking, but how this works is unknown. This medicine is available by prescription.  Nicotinic receptor partial agonist medicine simulates the effect of nicotine in your brain. This medicine is available by prescription. Ask your caregiver for advice about which medicines to use and how to use them based on your health history. Your caregiver will tell you what side effects to look out for if you choose to be on a medicine or therapy. Carefully read the information on the package. Do not use any other product containing nicotine while using a nicotine replacement product.  RELAPSE OR DIFFICULT SITUATIONS Most relapses occur within the first 3 months after quitting. Do not be discouraged if you start smoking again. Remember, most people try several times before finally quitting. You may have symptoms of withdrawal because your body is used to nicotine. You may crave cigarettes, be irritable, feel very hungry, cough often, get headaches, or have difficulty concentrating. The withdrawal symptoms are only temporary. They are strongest when you first quit, but they will go away within  10 14 days. To reduce the chances of relapse, try to:  Avoid drinking alcohol. Drinking lowers your chances of successfully quitting.  Reduce the amount of caffeine you consume. Once you quit smoking, the amount of caffeine in your body increases and can give you symptoms, such as a rapid heartbeat, sweating, and anxiety.  Avoid smokers because they can make you want to smoke.  Do not let weight gain distract you. Many smokers will gain weight when they quit, usually less than 10 pounds. Eat a healthy diet and stay active. You can always lose the weight gained after you quit.  Find ways to improve your mood other than smoking. FOR MORE INFORMATION  www.smokefree.gov  Document Released: 05/06/2001 Document Revised: 11/11/2011 Document Reviewed: 08/21/2011 [facility] Patient Information 2014 [place], [place].    Ischemic Stroke A stroke (cerebrovascular accident) is the sudden death of brain tissue. It is a medical emergency. A stroke can cause permanent loss of brain function. This can cause problems with different parts of your body. A transient ischemic attack (TIA) is different because it does not cause permanent damage. A TIA is a  short-lived problem of poor blood flow affecting a part of the brain. A TIA is also a serious problem because having a TIA greatly increases the chances of having a stroke. When symptoms first develop, you cannot know if the problem might be a stroke or TIA. CAUSES  A stroke is caused by a decrease of oxygen supply to an area of your brain. It is usually the result of a small blood clot or collection of cholesterol or fat (plaque) that blocks blood flow in the brain. A stroke can also be caused by blocked or damaged carotid arteries.  RISK FACTORS  High blood pressure (hypertension).  High cholesterol.  Diabetes mellitus.  Heart disease.  The build up of plaque in the blood vessels (peripheral artery disease or atherosclerosis).  The build up of plaque in  the blood vessels providing blood and oxygen to the brain (carotid artery stenosis).  An abnormal heart rhythm (atrial fibrillation).  Obesity.  Smoking.  Taking oral contraceptives (especially in combination with smoking).  Physical inactivity.  A diet high in fats, salt (sodium), and calories.  Alcohol use.  Use of illegal drugs (especially cocaine and methamphetamine).  Being African American.  Being over the age of 4.  Family history of stroke.  Previous history of blood clots, stroke, TIA, or heart attack.  Sickle cell disease. SYMPTOMS  These symptoms usually develop suddenly, or may be newly present upon awakening from sleep:  Sudden weakness or numbness of the face, arm, or leg, especially on one side of the body.  Sudden trouble walking or difficulty moving arms or legs.  Sudden confusion.  Sudden personality changes.  Trouble speaking (aphasia) or understanding.  Difficulty swallowing.  Sudden trouble seeing in one or both eyes.  Double vision.  Dizziness.  Loss of balance or coordination.  Sudden severe headache with no known cause.  Trouble reading or writing. DIAGNOSIS  Your caregiver can often determine the presence or absence of a stroke based on your symptoms, history, and physical exam. Computed tomography (CT) of the brain is usually performed to confirm the stroke, determine causes, and determine stroke severity. Other tests may be done to find the cause of the stroke. These tests may include:  Electrocardiography.  Continuous heart monitoring.  Echocardiography.  Carotid ultrasonography.  Magnetic resonance imaging (MRI).  A scan of the brain circulation.  Blood tests. PREVENTION  The risk of a stroke can be decreased by appropriately treating high blood pressure, high cholesterol, diabetes, heart disease, and obesity and by quitting smoking, limiting alcohol, and staying physically active. TREATMENT  Time is of the essence.  It is important to seek treatment within 3 4 hours of the start of symptoms because you may receive a medicine to dissolve the clot (thrombolytic) that cannot be given after that time. Even if you do not know when your symptoms began, get treatment as soon as possible. After the 4 hour window has passed, treatment may include rest, oxygen, intravenous (IV) fluids, and medicines to thin the blood (anticoagulants). Treatment of stroke depends on the duration, severity, and cause of your symptoms. Medicines and diet may be used to address diabetes, high blood pressure, and other risk factors. Physical, speech, and occupational therapists will assess you and work to improve any functions impaired by the stroke. Measures will be taken to prevent short-term and long-term complications, including infection from breathing foreign material into the lungs (aspiration pneumonia), blood clots in the legs, bedsores, and falls. Rarely, surgery may be needed to  remove large blood clots or to open up blocked arteries. HOME CARE INSTRUCTIONS   Take all medicines prescribed by your caregiver. Follow the directions carefully. Medicines may be used to control risk factors for a stroke. Be sure you understand all your medicine instructions.  You may be told to take aspirin or the anticoagulant warfarin. Warfarin needs to be taken exactly as instructed.  Too much and too little warfarin are both dangerous. Too much warfarin increases the risk of bleeding. Too little warfarin continues to allow the risk for blood clots. While taking warfarin, you will need to have regular blood tests to measure your blood clotting time. These blood tests usually include both the PT and INR tests. The PT and INR results allow your caregiver to adjust your dose of warfarin. The dose can change for many reasons. It is critically important that you take warfarin exactly as prescribed, and that you have your PT and INR levels drawn exactly as  directed.  Many foods, especially foods high in vitamin K can interfere with warfarin and affect the PT and INR results. Foods high in vitamin K include spinach, kale, broccoli, cabbage, collard and turnip greens, brussels sprouts, peas, cauliflower, seaweed, and parsley as well as beef and pork liver, green tea, and soybean oil. You should eat a consistent amount of foods high in vitamin K. Avoid major changes in your diet, or notify your caregiver before changing your diet. Arrange a visit with a dietitian to answer your questions.  Many medicines can interfere with warfarin and affect the PT and INR results. You must tell your caregiver about any and all medicines you take, this includes all vitamins and supplements. Be especially cautious with aspirin and anti-inflammatory medicines. Do not take or discontinue any prescribed or over-the-counter medicine except on the advice of your caregiver or pharmacist.  Warfarin can have side effects, such as excessive bruising or bleeding. You will need to hold pressure over cuts for longer than usual. Your caregiver or pharmacist will discuss other potential side effects.  Avoid sports or activities that may cause injury or bleeding.  Be mindful when shaving, flossing your teeth, or handling sharp objects.  Alcohol can change the body's ability to handle warfarin. It is best to avoid alcoholic drinks or consume only very small amounts while taking warfarin. Notify your caregiver if you change your alcohol intake.  Notify your dentist or other caregivers before procedures.  If swallow studies have determined that your swallowing reflex is present, you should eat healthy foods. A diet that includes 5 or more servings of fruits and vegetables a day may reduce the risk of stroke. Foods may need to be a special consistency (soft or pureed), or small bites may need to be taken in order to avoid aspirating or choking. Certain diets may be prescribed to address  high blood pressure, high cholesterol, diabetes, or obesity.  A low-sodium, low-saturated fat, low-trans fat, low-cholesterol diet is recommended to manage high blood pressure.  A low-saturated fat, low-trans fat, low-cholesterol, and high-fiber diet may control cholesterol levels.  A controlled-carbohydrate, controlled-sugar diet is recommended to manage diabetes.  A reduced-calorie, low-sodium, low-saturated fat, low-trans fat, low-cholesterol diet is recommended to manage obesity.  Maintain a healthy weight.  Stay physically active. It is recommended that you get at least 30 minutes of activity on most or all days.  Do not smoke.  Limit alcohol use even if you are not taking warfarin. Moderate alcohol use is considered to be:  No more than 2 drinks each day for men.  No more than 1 drink each day for nonpregnant women.  Stop drug abuse.  Home safety. A safe home environment is important to reduce the risk of falls. Your caregiver may arrange for specialists to evaluate your home. Having grab bars in the bedroom and bathroom is often important. Your caregiver may arrange for equipment to be used at home, such as raised toilets and a seat for the shower.  Physical, occupational, and speech therapy. Ongoing therapy may be needed to maximize your recovery after a stroke. If you have been advised to use a walker or a cane, use it at all times. Be sure to keep your therapy appointments.  Follow all instructions for follow-up with your caregiver. This is very important. This includes any referrals, physical therapy, rehabilitation, and lab tests. Proper follow up can prevent another stroke from occurring. SEEK MEDICAL CARE IF:  You have personality changes.  You have difficulty swallowing.  You are seeing double.  You have dizziness.  You have a fever.  You have skin breakdown. SEEK IMMEDIATE MEDICAL CARE IF:  Any of these symptoms may represent a serious problem that is an  emergency. Do not wait to see if the symptoms will go away. Get medical help right away. Call your local emergency services (911 in [place] Do not drive yourself to the hospital.  You have sudden weakness or numbness of the face, arm, or leg, especially on one side of the body.  You have sudden trouble walking or difficulty moving arms or legs.  You have sudden confusion.  You have trouble speaking (aphasia) or understanding.  You have sudden trouble seeing in one or both eyes.  You have a loss of balance or coordination.  You have a sudden, severe headache with no known cause.  You have new chest pain or an irregular heartbeat.  You have a partial or total loss of consciousness.   Document Released: 05/12/2005 Document Revised: 01/12/2013 Document Reviewed: 12/21/2011 [facility] Patient Information 2014 [place], [place].

## 2013-05-24 NOTE — Progress Notes (Signed)
Patient ID: Philip [name], male   DOB: 05-07-1956, 57 y.o.   MRN: [id number] SUBJECTIVE: CC: Chief Complaint  Patient presents with  . Follow-up    2 MONTH FOLLOW UP WANTS RX ON PAIN MED    HPI: Patient is here for follow up of hypertension/CVA/seizure/chronic headaches: denies Headache;deniesChest Pain;denies weakness;denies Shortness of Breath or Orthopnea;denies Visual changes;denies palpitations;denies cough;denies pedal edema;denies symptoms of TIA or stroke; admits to Compliance with medications. denies Problems with medications. The pain medications from surgery works best with his headaches. Wants to get some more oxycodone for this. Has a 3 monthly follow up with Dr Janann Colonel for his CVA related headaches and seizures.  Still smoking a few cigarettes.  Past Medical History  Diagnosis Date  . Hypertension     takes Hydralazine,Maxzide,Clonidine,and Amlodipine daily  . Hypertension     also takes Metoprolol daily  . Nausea     takes Zofran daily  . History of migraine     takes Topamax daily  . Sleep apnea     CPAP- tries to do it everyday.  . Stroke     .  Short term memory loss.  . Seizures     takes Dilantin daily.  last one was when he had a stroke.  Marland Kitchen Headache(784.0)     "Headache, I dont think they are migranes"  . Tobacco user   . Erectile dysfunction    Past Surgical History  Procedure Laterality Date  . No past surgeries    . Cholecystectomy  03/09/2013  . Cholecystectomy N/A 03/09/2013    Procedure: LAPAROSCOPIC CHOLECYSTECTOMY WITH INTRAOPERATIVE CHOLANGIOGRAM;  Surgeon: [name], MD;  Location: [facility] OR;  Service: General;  Laterality: N/A;  . Umbilical hernia repair N/A 03/09/2013    Procedure: HERNIA REPAIR UMBILICAL ADULT;  Surgeon: [name], MD;  Location: [facility] OR;  Service: General;  Laterality: N/A;   History   Social History  . Marital Status: Single    Spouse Name: N/A    Number of Children: N/A  . Years of Education: N/A    Occupational History  . Not on file.   Social History Main Topics  . Smoking status: Current Every Day Smoker -- 0.20 packs/day for 25 years    Types: Cigarettes    Start date: 05/26/1978  . Smokeless tobacco: Never Used  . Alcohol Use: 2.3 oz/week    3 Cans of beer, 1 Drinks containing 0.5 oz of alcohol per week     Comment: OCCASSIONAL  . Drug Use: No  . Sexual Activity: Not on file   Other Topics Concern  . Not on file   Social History Narrative  . No narrative on file   Family History  Problem Relation Age of Onset  . Heart disease Maternal Grandmother   . Heart disease Maternal Grandfather   . Heart disease Paternal Grandmother   . Heart disease Paternal Grandfather   . Cancer Cousin   . Hypertension Mother    Current Outpatient Prescriptions on File Prior to Visit  Medication Sig Dispense Refill  . amLODipine (NORVASC) 10 MG tablet Take 1 tablet (10 mg total) by mouth daily.  30 tablet  4  . aspirin EC 81 MG tablet Take 81 mg by mouth daily.      . Cholecalciferol (VITAMIN D3) 50000 UNITS CAPS Take 50,000 Units by mouth once a week.  4 capsule  4  . cloNIDine (CATAPRES) 0.2 MG tablet Take 0.2 mg by mouth 2 (two) times  daily.      . fish oil-omega-3 fatty acids 1000 MG capsule Take 2 g by mouth daily.      . furosemide (LASIX) 40 MG tablet Take 1 tablet (40 mg total) by mouth daily.  30 tablet  3  . hydrALAZINE (APRESOLINE) 25 MG tablet Take 25 mg by mouth daily.      Marland Kitchen HYDROcodone-acetaminophen (NORCO/VICODIN) 5-325 MG per tablet Take 1 tablet by mouth every 4 (four) hours as needed for moderate pain.      Marland Kitchen ondansetron (ZOFRAN) 4 MG tablet Take 1 tablet (4 mg total) by mouth every 6 (six) hours.  12 tablet  0  . oxyCODONE-acetaminophen (PERCOCET/ROXICET) 5-325 MG per tablet Take 2 tablets by mouth every 6 (six) hours as needed for pain.  20 tablet  0  . potassium chloride (K-DUR) 10 MEQ tablet Take 1 tablet (10 mEq total) by mouth 2 (two) times daily.  60 tablet  2   . topiramate (TOPAMAX) 25 MG tablet Take 25 mg by mouth 2 (two) times daily.      . TOPROL XL 50 MG 24 hr tablet TAKE ONE TABLET BY MOUTH ONCE DAILY  30 tablet  2  . triamterene-hydrochlorothiazide (MAXZIDE-25) 37.5-25 MG per tablet Take 1 tablet by mouth daily.       No current facility-administered medications on file prior to visit.   No Known Allergies  There is no immunization history on file for this patient. Prior to Admission medications   Medication Sig Start Date End Date Taking? Authorizing Provider  amLODipine (NORVASC) 10 MG tablet Take 1 tablet (10 mg total) by mouth daily. 03/14/13   Ileana Ladd, MD  aspirin EC 81 MG tablet Take 81 mg by mouth daily.    Historical Provider, MD  Cholecalciferol (VITAMIN D3) 50000 UNITS CAPS Take 50,000 Units by mouth once a week. 12/15/12   Ernestina Penna, MD  cloNIDine (CATAPRES) 0.2 MG tablet Take 0.2 mg by mouth 2 (two) times daily.    Historical Provider, MD  fish oil-omega-3 fatty acids 1000 MG capsule Take 2 g by mouth daily.    Historical Provider, MD  furosemide (LASIX) 40 MG tablet Take 1 tablet (40 mg total) by mouth daily. 11/23/12   Ernestina Penna, MD  hydrALAZINE (APRESOLINE) 25 MG tablet Take 25 mg by mouth daily.    Historical Provider, MD  HYDROcodone-acetaminophen (NORCO/VICODIN) 5-325 MG per tablet Take 1 tablet by mouth every 4 (four) hours as needed for moderate pain.    Historical Provider, MD  ondansetron (ZOFRAN) 4 MG tablet Take 1 tablet (4 mg total) by mouth every 6 (six) hours. 01/31/13   Kristen N Ward, DO  oxyCODONE-acetaminophen (PERCOCET/ROXICET) 5-325 MG per tablet Take 2 tablets by mouth every 6 (six) hours as needed for pain. 01/31/13   Kristen N Ward, DO  phenytoin (DILANTIN) 100 MG ER capsule Take 100 mg by mouth daily. 01/27/13   Ileana Ladd, MD  potassium chloride (K-DUR) 10 MEQ tablet Take 1 tablet (10 mEq total) by mouth 2 (two) times daily. 01/27/13   Ernestina Penna, MD  topiramate (TOPAMAX) 25 MG tablet Take  25 mg by mouth 2 (two) times daily.    Historical Provider, MD  TOPROL XL 50 MG 24 hr tablet TAKE ONE TABLET BY MOUTH ONCE DAILY 05/11/13   Ileana Ladd, MD  triamterene-hydrochlorothiazide (MAXZIDE-25) 37.5-25 MG per tablet Take 1 tablet by mouth daily.    Historical Provider, MD  ROS: As above in the HPI. All other systems are stable or negative.  OBJECTIVE: APPEARANCE:  Patient in no acute distress.The patient appeared well nourished and normally developed. Acyanotic. Waist: VITAL SIGNS:BP 116/82  Pulse 69  Temp(Src) 98.3 F (36.8 C) (Oral)  Ht 5\' 7"  (1.702 m)  Wt 191 lb 6.4 oz (86.818 kg)  BMI 29.97 kg/m2 AAM  SKIN: warm and  Dry without overt rashes, tattoos and scars  HEAD and Neck: without JVD, Head and scalp: normal Eyes:No scleral icterus. Fundi normal, eye movements normal. Ears: Auricle normal, canal normal, Tympanic membranes normal, insufflation normal. Nose: normal Throat: normal Neck & thyroid: normal  CHEST & LUNGS: Chest wall: normal Lungs: Clear  CVS: Reveals the PMI to be normally located. Regular rhythm, First and Second Heart sounds are normal,  absence of murmurs, rubs or gallops. Peripheral vasculature: Radial pulses: normal Dorsal pedis pulses: normal Posterior pulses: normal  ABDOMEN:  Appearance: overweight, central obese. Benign, no organomegaly, no masses, no Abdominal Aortic enlargement. No Guarding , no rebound. No Bruits. Bowel sounds: normal  RECTAL: N/A GU: N/A  EXTREMETIES: nonedematous.  MUSCULOSKELETAL:  Spine: normal Joints: intact  NEUROLOGIC: oriented to time,place and person; nonfocal.  Results for orders placed during the hospital encounter of 03/09/13  POCT I-STAT 4, (NA,K, GLUC, HGB,HCT)      Result Value Range   Sodium 136  135 - 145 mEq/L   Potassium 3.1 (*) 3.5 - 5.1 mEq/L   Glucose, Bld 129 (*) 70 - 99 mg/dL   HCT 98.1  19.1 - 47.8 %   Hemoglobin 17.3 (*) 13.0 - 17.0 g/dL     ASSESSMENT: Hyperlipidemia - Plan: NMR, lipoprofile, CMP14+EGFR  CVA (cerebral vascular accident) - Plan: CMP14+EGFR  History of seizure disorder, since CVA  HTN (hypertension) - Plan: CMP14+EGFR  OSA (obstructive sleep apnea)  Tobacco user  Erectile dysfunction  PLAN:  Patient to bring the medication in for [place] to identify that he uses for ED.  Smoking cessation counseling. Handout in the AVS.  Handout on stroke.in the AVS  Healthy lifestyle recommended.  Discussed with patient that he needs to see his neurologist for headache control in view of seizure and CVA history.  Orders Placed This Encounter  Procedures  . NMR, lipoprofile  . CMP14+EGFR   Meds ordered this encounter  Medications  . phenytoin (DILANTIN) 100 MG ER capsule    Sig: Take 100 mg by mouth daily.   Medications Discontinued During This Encounter  Medication Reason  . furosemide (LASIX) 40 MG tablet Duplicate  . HYDROcodone-acetaminophen (NORCO/VICODIN) 5-325 MG per tablet Duplicate  . phenytoin (DILANTIN) 100 MG ER capsule    Return in about 3 months (around 08/22/2013) for Recheck medical problems.  Kris No P. Modesto Charon, M.D.

## 2013-05-25 LAB — NMR, LIPOPROFILE
Cholesterol: 215 mg/dL — ABNORMAL HIGH (ref <200)
HDL Cholesterol by NMR: 57 mg/dL (ref >=40)
HDL Particle Number: 44 umol/L (ref >=30.5)
LDL Particle Number: 1258 nmol/L — ABNORMAL HIGH (ref <1000)
LDL Size: 19.9 nm — ABNORMAL LOW (ref >20.5)
LDLC SERPL CALC-MCNC: 92 mg/dL (ref <100)
LP-IR Score: 66 — ABNORMAL HIGH (ref <=45)
Small LDL Particle Number: 645 nmol/L — ABNORMAL HIGH (ref <=527)
Triglycerides by NMR: 332 mg/dL — ABNORMAL HIGH (ref <150)

## 2013-05-25 LAB — CMP14+EGFR
ALT: 44 IU/L (ref 0–44)
AST: 41 IU/L — ABNORMAL HIGH (ref 0–40)
Albumin/Globulin Ratio: 1.5 (ref 1.1–2.5)
Albumin: 4.1 g/dL (ref 3.5–5.5)
Alkaline Phosphatase: 130 IU/L — ABNORMAL HIGH (ref 39–117)
BUN/Creatinine Ratio: 10 (ref 9–20)
BUN: 12 mg/dL (ref 6–24)
CO2: 26 mmol/L (ref 18–29)
Calcium: 9.4 mg/dL (ref 8.7–10.2)
Chloride: 99 mmol/L (ref 97–108)
Creatinine, Ser: 1.16 mg/dL (ref 0.76–1.27)
GFR calc Af Amer: 80 mL/min/{1.73_m2} (ref >59)
GFR calc non Af Amer: 70 mL/min/{1.73_m2} (ref >59)
Globulin, Total: 2.7 g/dL (ref 1.5–4.5)
Glucose: 173 mg/dL — ABNORMAL HIGH (ref 65–99)
Potassium: 3.7 mmol/L (ref 3.5–5.2)
Sodium: 142 mmol/L (ref 134–144)
Total Bilirubin: 0.3 mg/dL (ref 0.0–1.2)
Total Protein: 6.8 g/dL (ref 6.0–8.5)

## 2013-05-28 NOTE — Progress Notes (Signed)
Quick Note:  Call Patient Labs that are abnormal: Sugar abnormal Triglycerides high  The rest are at goal  Recommendations:  Office visit to discuss results and to do a HGBA1C at the same time.  ______

## 2013-06-02 ENCOUNTER — Telehealth: Payer: Self-pay | Admitting: Family Medicine

## 2013-06-06 ENCOUNTER — Other Ambulatory Visit: Payer: Self-pay

## 2013-08-14 ENCOUNTER — Other Ambulatory Visit: Payer: Self-pay | Admitting: Family Medicine

## 2013-08-16 NOTE — Telephone Encounter (Signed)
Call patient : Prescription refilled & sent to pharmacy in [place]. 

## 2013-08-16 NOTE — Telephone Encounter (Signed)
Last Potassium  3.7 on 05/24/13.

## 2013-08-18 ENCOUNTER — Other Ambulatory Visit: Payer: Self-pay | Admitting: Family Medicine

## 2013-09-02 IMAGING — US US ABDOMEN COMPLETE
1 series · 13 of 25 positions shown · non-contrast
Comparison: 06/10/2011

CLINICAL DATA: 57-year-old male with abdominal pain, chest pain,
abnormal LFTs.

EXAM:
ABDOMEN ULTRASOUND

[Series 1: us abdomen complete · 0.20mm/px · 13 of 84 slices shown]
[im 1/84]
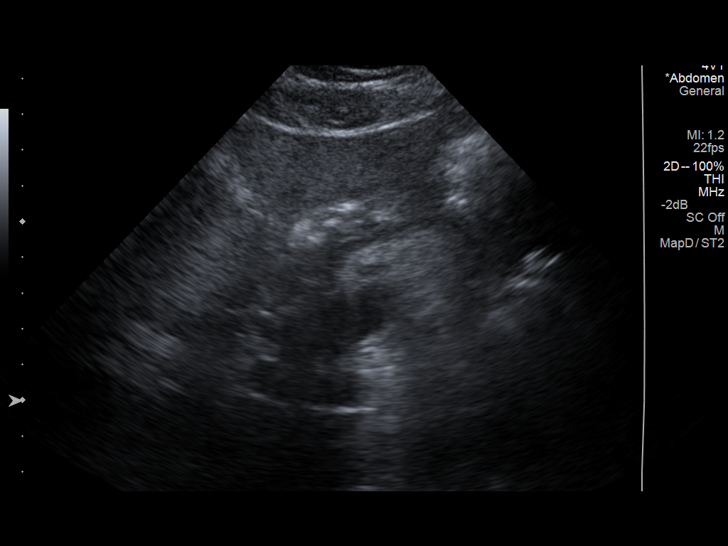
[im 7/84]
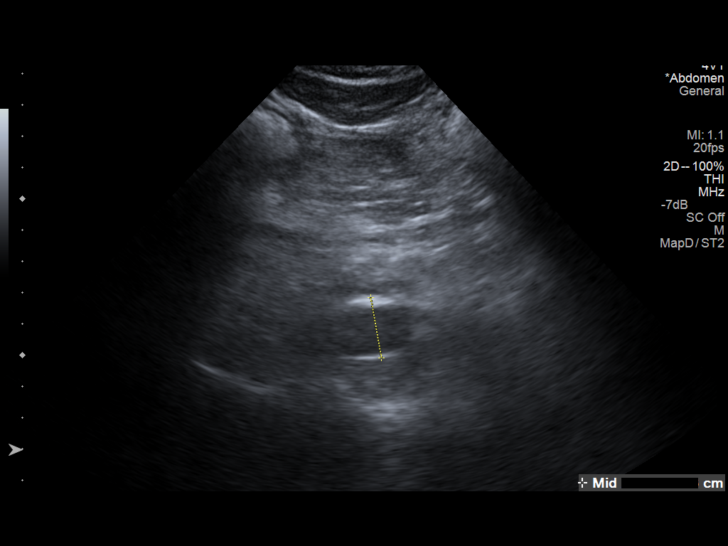
[im 14/84]
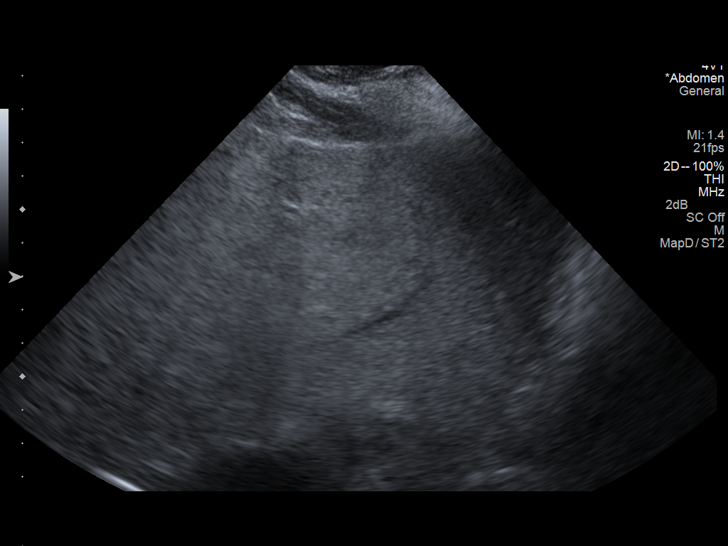
[im 21/84]
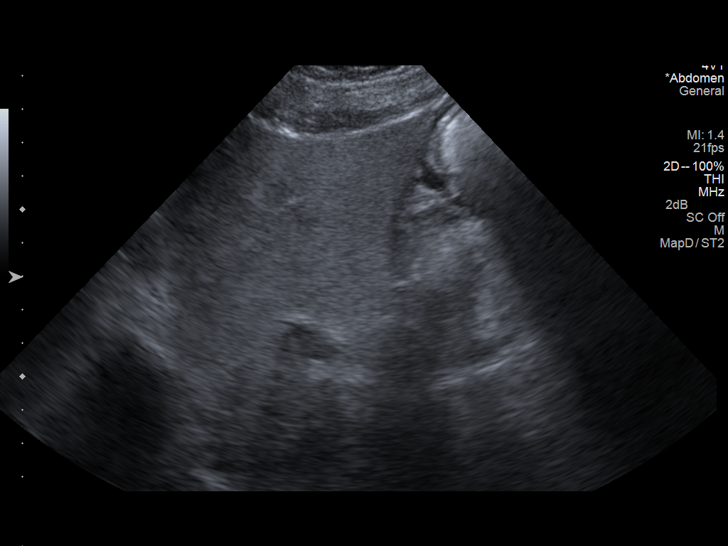
[im 28/84]
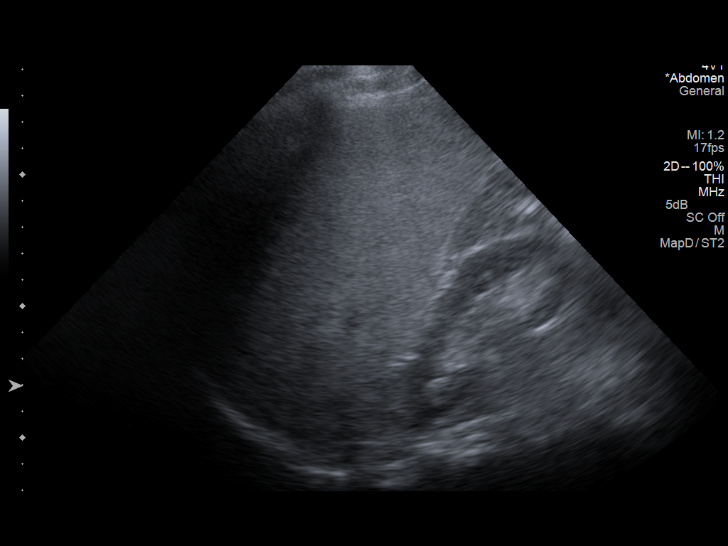
[im 35/84]
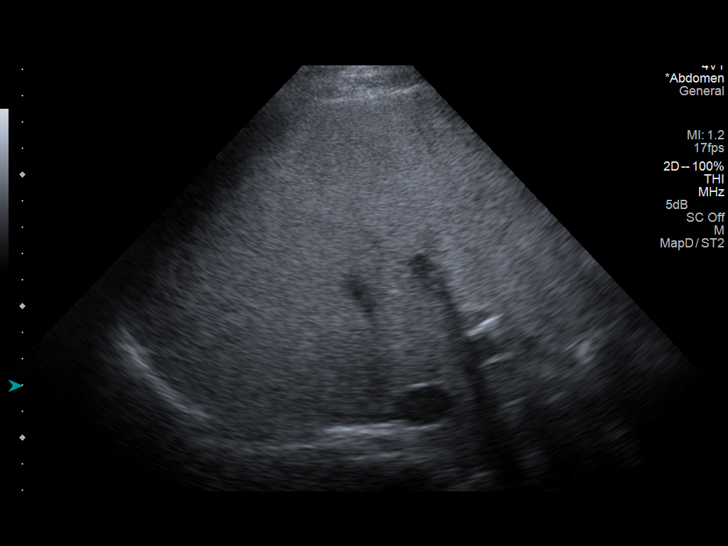
[im 42/84]
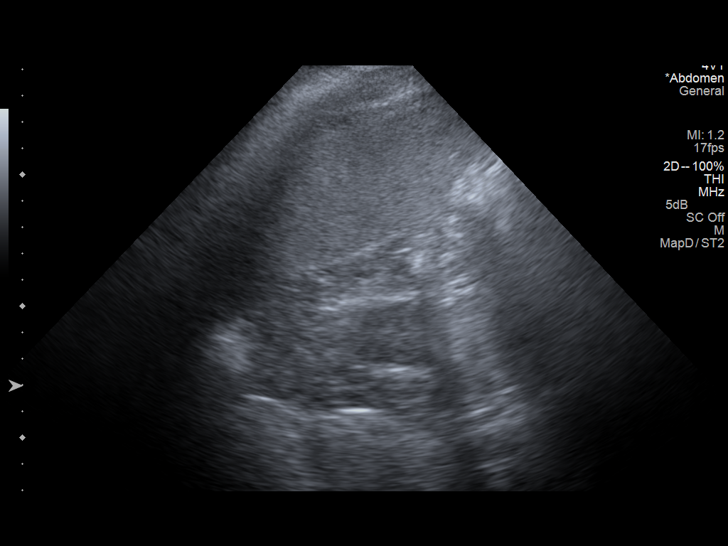
[im 49/84]
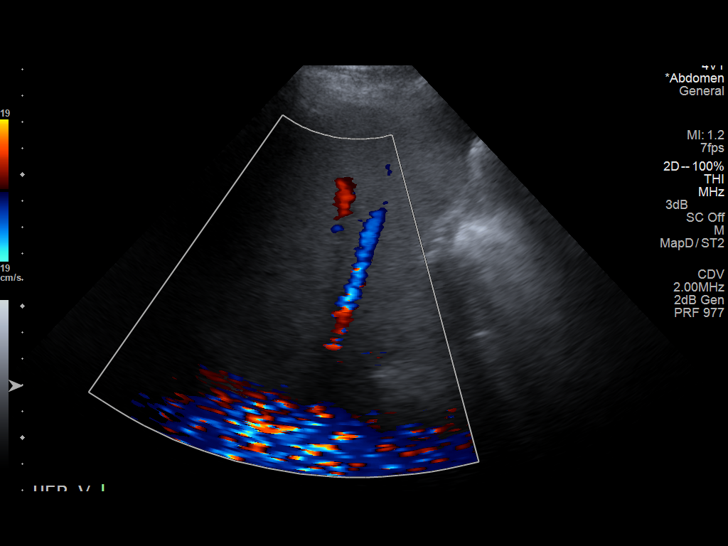
[im 56/84]
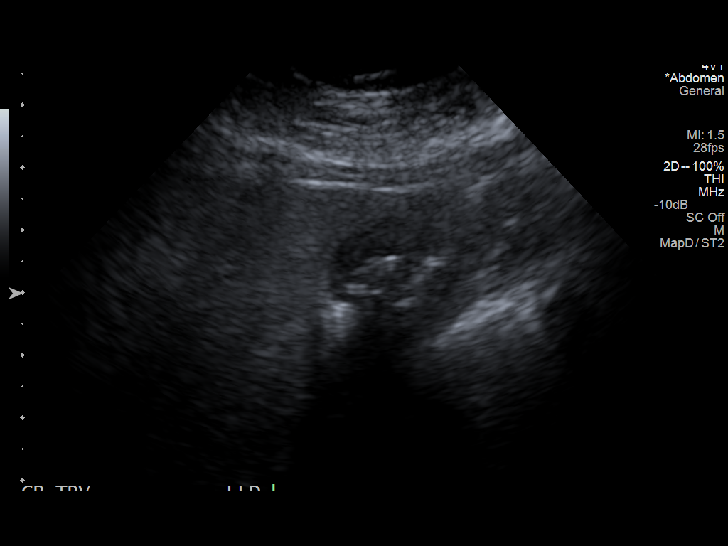
[im 63/84]
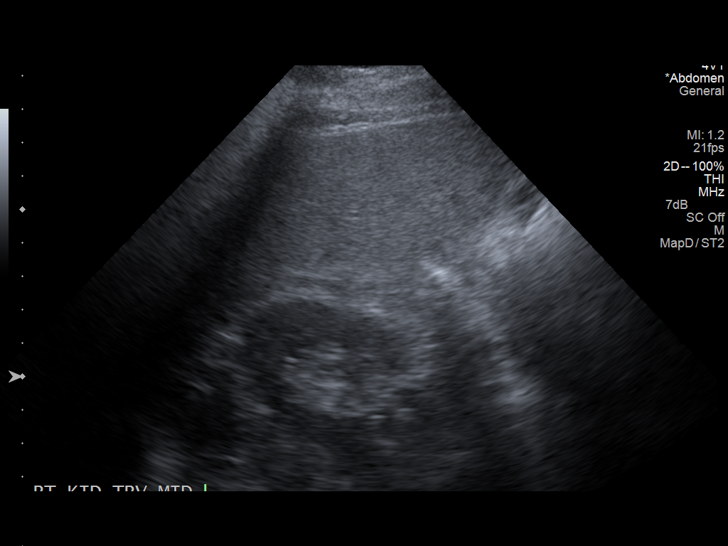
[im 70/84]
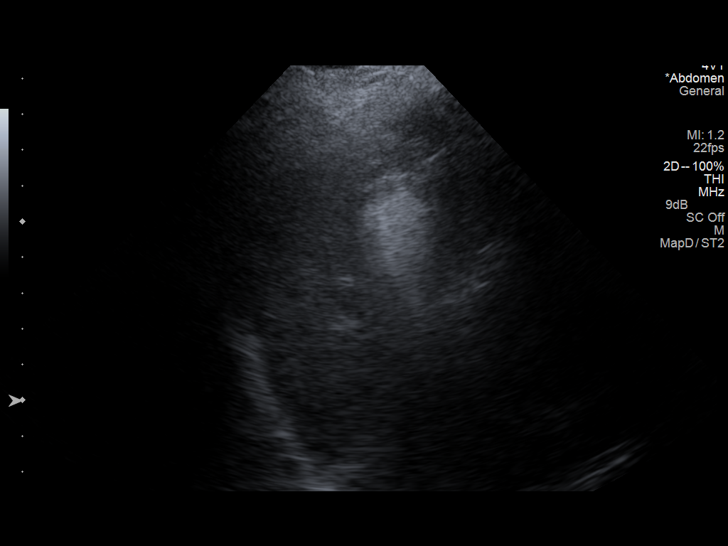
[im 77/84]
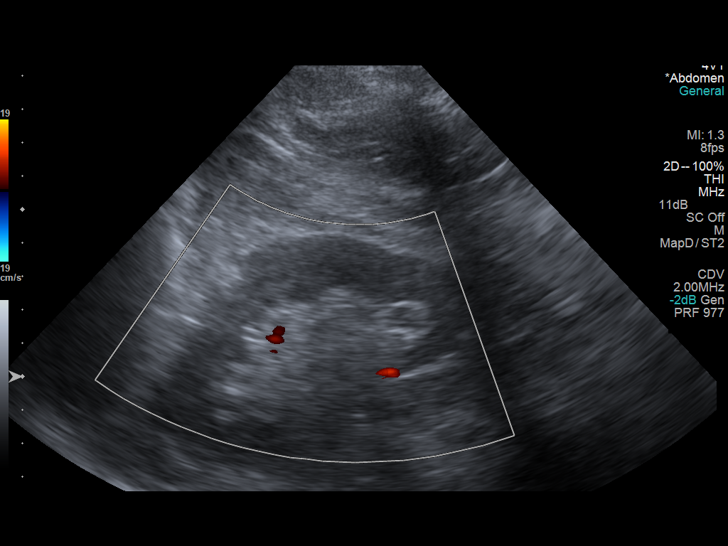
[im 84/84]
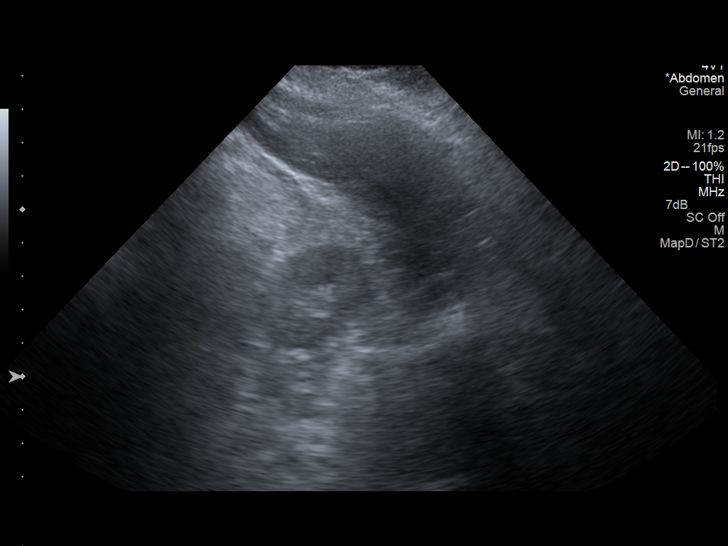

[13 of 25 positions shown; findings below may reference images not displayed]

FINDINGS: Gallbladder

Contracted. Echogenic stones the identified (images 53 and 55). No
sonographic Murphy sign elicited. Gallbladder wall thickness cannot
be determined.

Common bile duct

Diameter: Normal, 5 mm diameter.

Chronically increased echogenicity. No intrahepatic ductal
dilatation or discrete liver lesion.

Liver

No focal lesion identified. Within normal limits in parenchymal
echogenicity.

IVC

No abnormality visualized.

Pancreas

Incompletely visualized due to overlying bowel gas, visualized
portions within normal limits.

Spleen

Size and appearance within normal limits.

Right Kidney

Length: 11.4 cm. Echogenicity within normal limits. No mass or
hydronephrosis visualized.

Left Kidney

Length: 10.1 cm. Echogenicity within normal limits. No mass or
hydronephrosis visualized.

Abdominal aorta

Incompletely visualized due to overlying bowel gas, visualized
portions within normal limits.
IMPRESSION: 1. Contracted gallbladder today. Chronic cholelithiasis. No strong
evidence of acute cholecystitis.

2. Chronic hepatic steatosis. No biliary ductal dilatation.

## 2013-09-05 ENCOUNTER — Other Ambulatory Visit: Payer: Self-pay | Admitting: *Deleted

## 2013-09-05 MED ORDER — CLONIDINE HCL 0.2 MG PO TABS: 0.2000 mg | ORAL_TABLET | Freq: Two times a day (BID) | ORAL | Status: DC

## 2013-09-05 MED ORDER — HYDRALAZINE HCL 25 MG PO TABS: 25.0000 mg | ORAL_TABLET | Freq: Every day | ORAL | Status: DC

## 2013-09-26 ENCOUNTER — Other Ambulatory Visit: Payer: Self-pay | Admitting: Family Medicine

## 2013-09-27 NOTE — Telephone Encounter (Signed)
Do not see on current med list. Please advise on refill 

## 2013-09-27 NOTE — Telephone Encounter (Signed)
Call patient : Prescription refilled & sent to pharmacy in [place]. 

## 2013-10-15 IMAGING — RF DG CHOLANGIOGRAM OPERATIVE
1 series · 5 of 5 positions shown · non-contrast
Comparison: Ultrasound abdomen 01/25/2013

CLINICAL DATA: Intraoperative cholangiogram, cholelithiasis,
cholecystectomy

EXAM:
INTRAOPERATIVE CHOLANGIOGRAM
TECHNIQUE: Cholangiographic images from the C-arm fluoroscopic device were
submitted for interpretation post-operatively. Please see the
procedural report for the amount of contrast and the fluoroscopy
time utilized.

[Series 1: run · 2 acquisitions, 5 frames shown]
[im 1/2]
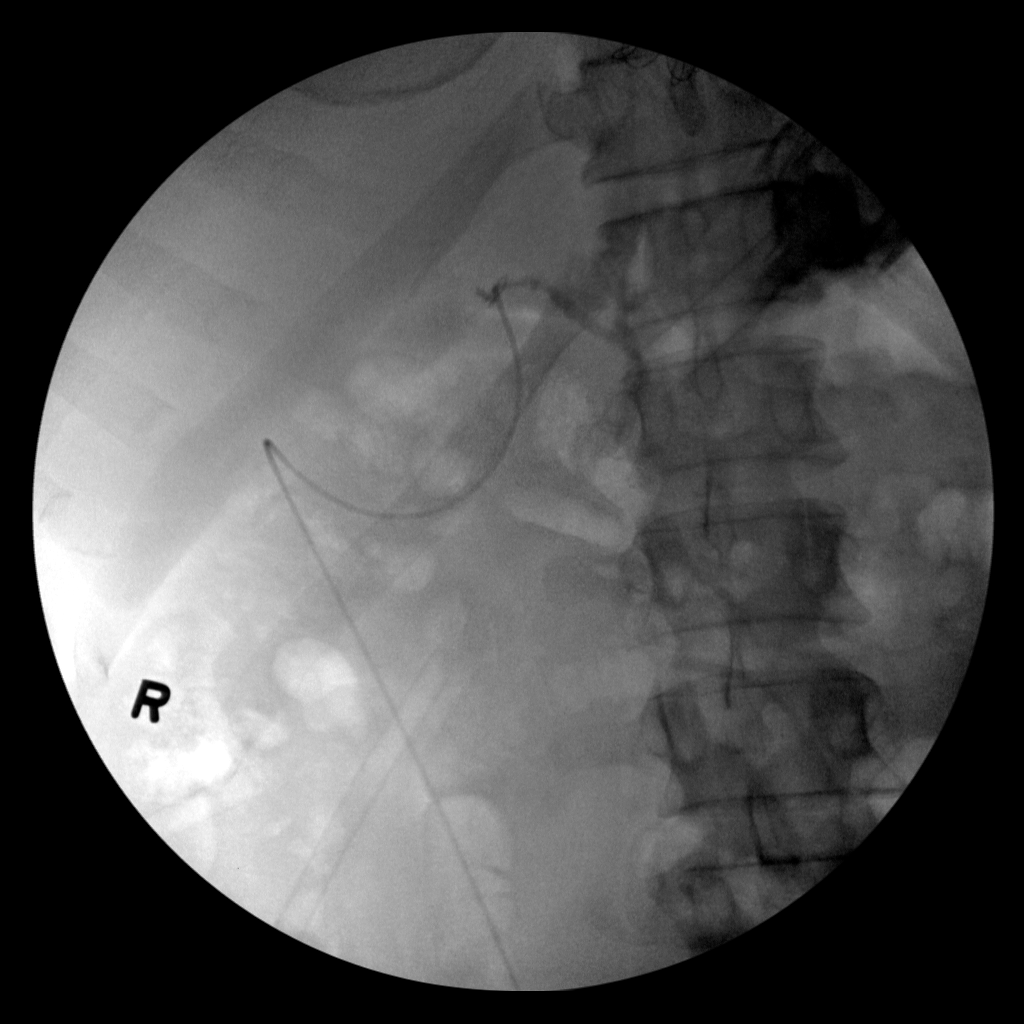
[im 1/2]
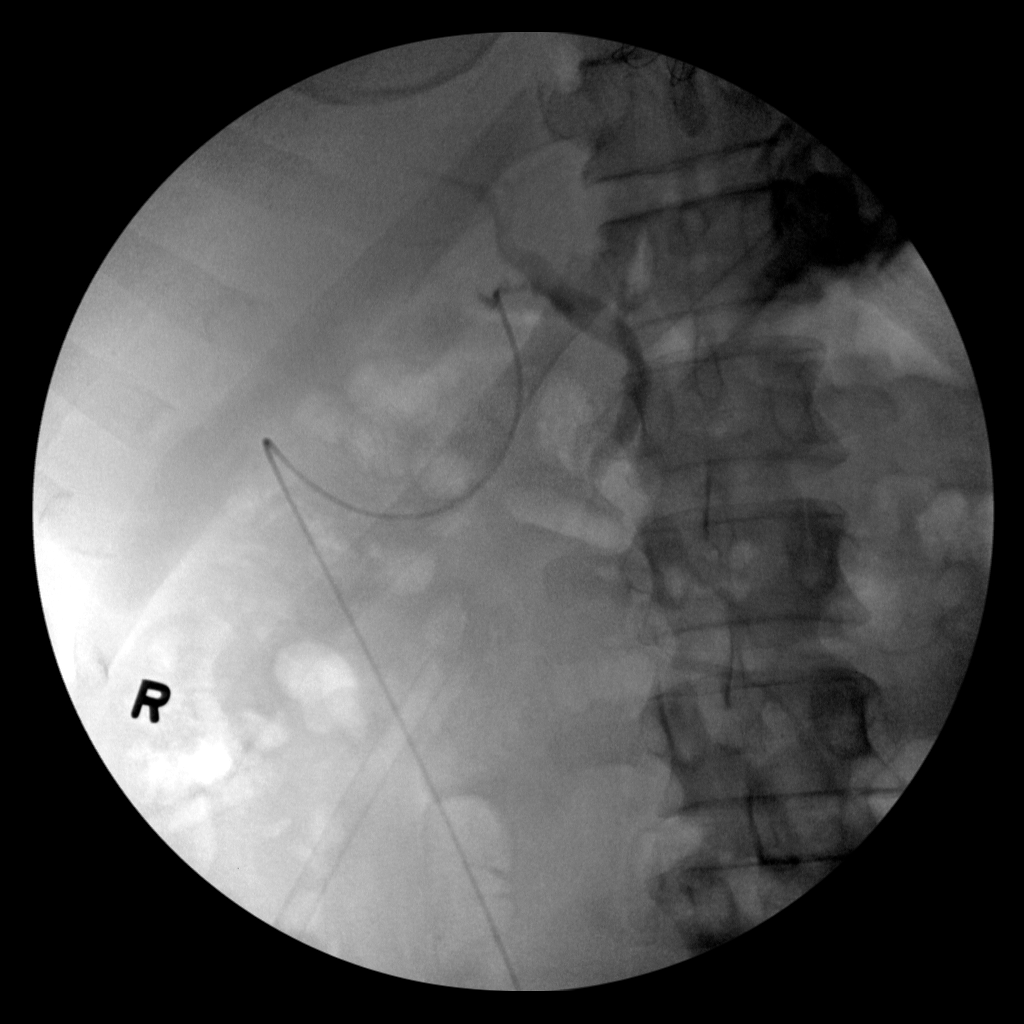
[im 1/2]
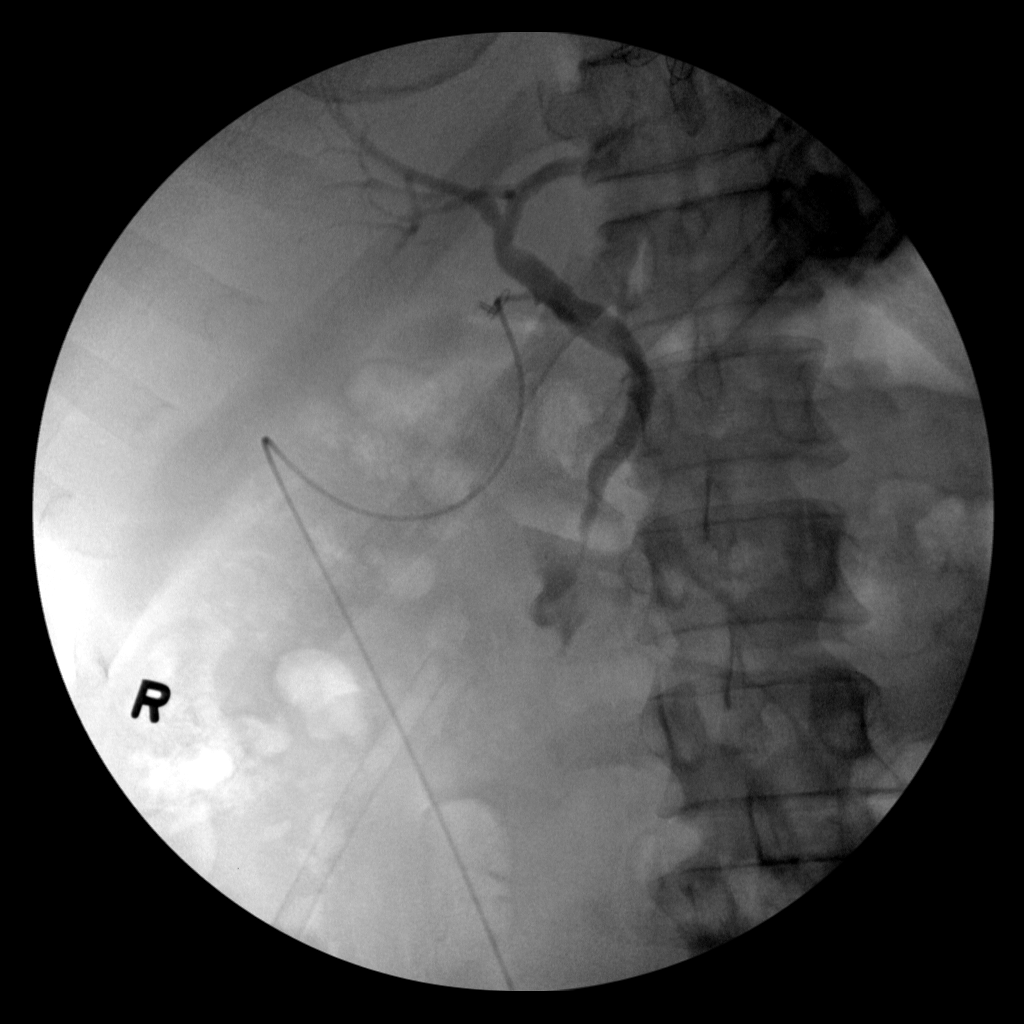
[im 1/2]
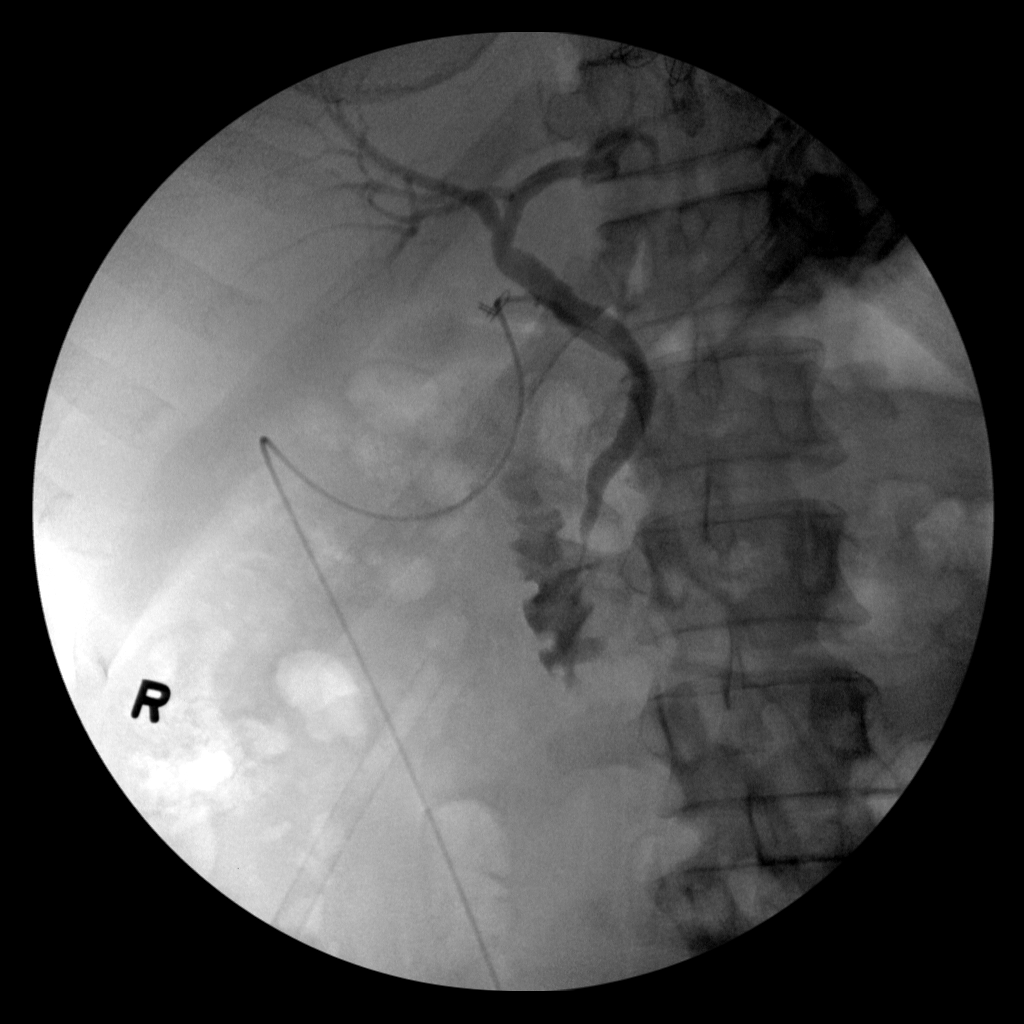
[im 2/2]
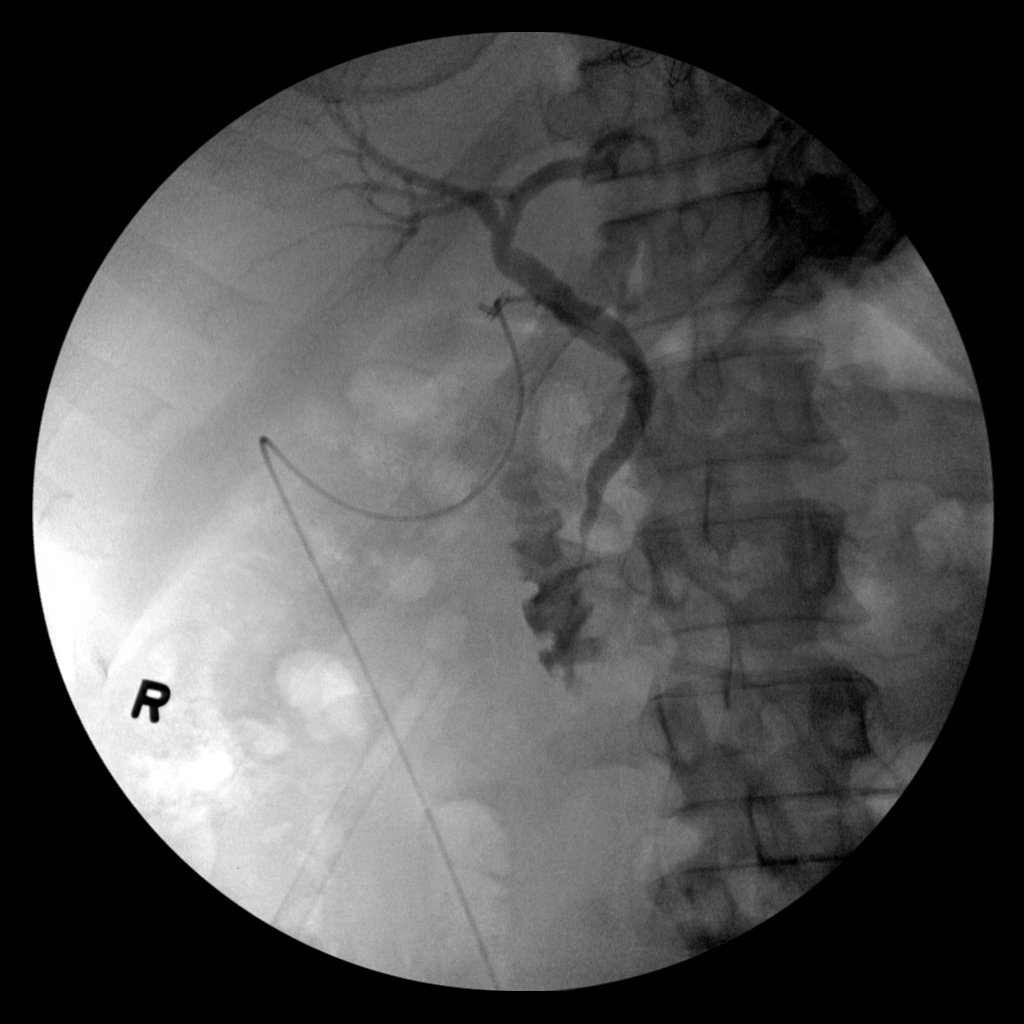

[5 of 5 positions shown; findings below may reference images not displayed]

FINDINGS: Examination interpreted postoperatively.

Normal caliber CBD and common hepatic duct.

Patent CBD with free spillage of contrast into duodenum.

Visualize intrahepatic radicles normal appearance.

No evidence of stricture or significant biliary wall irregularity.

No intraluminal filling defects identified to suggest
choledocholithiasis.
IMPRESSION: Normal intraoperative cholangiogram.

## 2013-12-01 ENCOUNTER — Other Ambulatory Visit: Payer: Self-pay | Admitting: *Deleted

## 2013-12-01 MED ORDER — TRIAMTERENE-HCTZ 37.5-25 MG PO TABS: 1.0000 | ORAL_TABLET | Freq: Every day | ORAL | Status: DC

## 2013-12-01 MED ORDER — POTASSIUM CHLORIDE ER 10 MEQ PO TBCR: EXTENDED_RELEASE_TABLET | ORAL | Status: DC

## 2013-12-01 NOTE — Telephone Encounter (Signed)
Patient NTBS for follow up and lab work  

## 2013-12-01 NOTE — Telephone Encounter (Signed)
Last Labs 12/14. Dr Jacelyn Grip patient.

## 2013-12-01 NOTE — Telephone Encounter (Signed)
Dr. Jacelyn Grip patient. Last ov 12/14.

## 2013-12-02 ENCOUNTER — Other Ambulatory Visit: Payer: Medicaid Other

## 2013-12-02 ENCOUNTER — Ambulatory Visit (INDEPENDENT_AMBULATORY_CARE_PROVIDER_SITE_OTHER): Payer: Medicaid Other | Admitting: Family Medicine

## 2013-12-02 ENCOUNTER — Encounter: Payer: Self-pay | Admitting: Family Medicine

## 2013-12-02 VITALS — BP 136/94 | HR 70 | Temp 98.7°F

## 2013-12-02 DIAGNOSIS — I10 Essential (primary) hypertension: Secondary | ICD-10-CM

## 2013-12-02 NOTE — Progress Notes (Signed)
Patient here for BP recheck per his neurologist Result reviewed per provider and patient given readings written on paper to give neurologist.

## 2013-12-26 ENCOUNTER — Other Ambulatory Visit: Payer: Self-pay | Admitting: *Deleted

## 2013-12-26 MED ORDER — FUROSEMIDE 40 MG PO TABS: 40.0000 mg | ORAL_TABLET | Freq: Every day | ORAL | Status: DC

## 2014-01-12 ENCOUNTER — Ambulatory Visit (INDEPENDENT_AMBULATORY_CARE_PROVIDER_SITE_OTHER): Payer: Medicaid Other

## 2014-01-12 ENCOUNTER — Encounter: Payer: Self-pay | Admitting: Family Medicine

## 2014-01-12 ENCOUNTER — Ambulatory Visit (INDEPENDENT_AMBULATORY_CARE_PROVIDER_SITE_OTHER): Payer: Medicaid Other | Admitting: Family Medicine

## 2014-01-12 ENCOUNTER — Other Ambulatory Visit: Payer: Self-pay | Admitting: *Deleted

## 2014-01-12 VITALS — BP 140/86 | HR 104 | Temp 99.2°F | Ht 67.0 in | Wt 191.0 lb

## 2014-01-12 DIAGNOSIS — R109 Unspecified abdominal pain: Secondary | ICD-10-CM

## 2014-01-12 DIAGNOSIS — R1030 Lower abdominal pain, unspecified: Secondary | ICD-10-CM

## 2014-01-12 DIAGNOSIS — N5089 Other specified disorders of the male genital organs: Secondary | ICD-10-CM

## 2014-01-12 DIAGNOSIS — R319 Hematuria, unspecified: Secondary | ICD-10-CM

## 2014-01-12 DIAGNOSIS — N390 Urinary tract infection, site not specified: Secondary | ICD-10-CM

## 2014-01-12 LAB — POCT CBC
GRANULOCYTE PERCENT: 64.2 % (ref 37–80)
HEMATOCRIT: 48.2 % (ref 43.5–53.7)
Hemoglobin: 16.3 g/dL (ref 14.1–18.1)
LYMPH, POC: 1.5 (ref 0.6–3.4)
MCH, POC: 31.4 pg — AB (ref 27–31.2)
MCHC: 33.9 g/dL (ref 31.8–35.4)
MCV: 92.6 fL (ref 80–97)
MPV: 8.7 fL (ref 0–99.8)
PLATELET COUNT, POC: 100 10*3/uL — AB (ref 142–424)
POC Granulocyte: 3.2 (ref 2–6.9)
POC LYMPH PERCENT: 29.7 %L (ref 10–50)
RBC: 5.2 M/uL (ref 4.69–6.13)
RDW, POC: 14 %
WBC: 5 10*3/uL (ref 4.6–10.2)

## 2014-01-12 LAB — POCT URINALYSIS DIPSTICK

## 2014-01-12 LAB — POCT UA - MICROSCOPIC ONLY
Casts, Ur, LPF, POC: NEGATIVE
Crystals, Ur, HPF, POC: NEGATIVE
YEAST UA: NEGATIVE

## 2014-01-12 NOTE — Addendum Note (Signed)
Addended by: Pollyann Kennedy F on: 01/12/2014 03:55 PM   Modules accepted: Orders

## 2014-01-12 NOTE — Progress Notes (Deleted)
   Subjective:    Patient ID: Philip [name], male    DOB: 1956-05-10, 58 y.o.   MRN: [id number]  HPI Patient here today for lower abdominal pain that started yesterday.        Patient Active Problem List   Diagnosis Date Noted  . Tobacco user   . Erectile dysfunction   . Chronic cholecystitis with calculus 02/04/2013  . Umbilical hernia 68/03/5725  . Gallstones 01/27/2013  . Hyperlipidemia 12/13/2012  . OSA (obstructive sleep apnea) 11/03/2012  . HTN (hypertension) 09/16/2012  . CVA (cerebral vascular accident) 09/16/2012  . History of seizure disorder, since CVA 09/16/2012  . Unspecified asthma(493.90) 09/16/2012   Outpatient Encounter Prescriptions as of 01/12/2014  Medication Sig  . amLODipine (NORVASC) 10 MG tablet Take 1 tablet (10 mg total) by mouth daily.  Marland Kitchen aspirin EC 81 MG tablet Take 81 mg by mouth daily.  . Cholecalciferol (VITAMIN D3) 50000 UNITS CAPS Take 50,000 Units by mouth once a week.  . cloNIDine (CATAPRES) 0.2 MG tablet Take 1 tablet (0.2 mg total) by mouth 2 (two) times daily.  . fish oil-omega-3 fatty acids 1000 MG capsule Take 2 g by mouth daily.  . furosemide (LASIX) 40 MG tablet Take 1 tablet (40 mg total) by mouth daily.  . hydrALAZINE (APRESOLINE) 25 MG tablet Take 1 tablet (25 mg total) by mouth daily.  Marland Kitchen HYDROcodone-acetaminophen (NORCO/VICODIN) 5-325 MG per tablet Take 1 tablet by mouth every 4 (four) hours as needed for moderate pain.  Marland Kitchen ondansetron (ZOFRAN) 4 MG tablet Take 1 tablet (4 mg total) by mouth every 6 (six) hours.  Marland Kitchen oxyCODONE-acetaminophen (PERCOCET/ROXICET) 5-325 MG per tablet Take 2 tablets by mouth every 6 (six) hours as needed for pain.  . potassium chloride (K-DUR) 10 MEQ tablet TAKE ONE TABLET BY MOUTH TWICE DAILY  . topiramate (TOPAMAX) 25 MG tablet Take 25 mg by mouth 2 (two) times daily.  . TOPROL XL 50 MG 24 hr tablet TAKE ONE TABLET BY MOUTH ONCE DAILY  . triamterene-hydrochlorothiazide (MAXZIDE-25) 37.5-25 MG per tablet  Take 1 tablet by mouth daily.  . [DISCONTINUED] phenytoin (DILANTIN) 100 MG ER capsule Take 100 mg by mouth daily.    Review of Systems  Constitutional: Negative.   HENT: Negative.   Eyes: Negative.   Respiratory: Negative.   Cardiovascular: Negative.   Gastrointestinal: Positive for abdominal pain. Negative for nausea.  Endocrine: Negative.   Genitourinary: Negative.   Musculoskeletal: Negative.   Skin: Negative.   Allergic/Immunologic: Negative.   Neurological: Negative.   Hematological: Negative.   Psychiatric/Behavioral: Negative.        Objective:   Physical Exam BP 175/124  Pulse 104  Temp(Src) 99.2 F (37.3 C) (Oral)  Ht 5\' 7"  (1.702 m)  Wt 191 lb (86.637 kg)  BMI 29.91 kg/m2        Assessment & Plan:

## 2014-01-12 NOTE — Progress Notes (Signed)
 Subjective:    Patient ID: Philip [name], male    DOB: 11/02/1955, 58 y.o.   MRN: [id number]  HPI Intermittent lower abd pain since yesterday. Pain is severe at times. Last bowel movement was yesterday and he describes it as watery. He is on narcotic pain medications but does not take a stool softener. He has a history of kidney stones as well.    Review of Systems  Constitutional: Negative.  Negative for fever and chills.  HENT: Negative.   Eyes: Negative.   Respiratory: Negative.   Cardiovascular: Negative.   Gastrointestinal: Positive for abdominal pain (lower abd pain) and diarrhea (watery stools yesterday). Negative for nausea, vomiting and blood in stool.       Feels bloated   Endocrine: Negative.   Genitourinary: Positive for hematuria (urine had an orange/red tint to it) and difficulty urinating (scant urination). Negative for dysuria, frequency and flank pain.  Musculoskeletal: Negative.   Skin: Negative.   Allergic/Immunologic: Negative.   Neurological: Negative.   Hematological: Negative.   Psychiatric/Behavioral: Negative.        Objective:   Physical Exam  Nursing note and vitals reviewed. Constitutional: He is oriented to person, place, and time. He appears well-developed and well-nourished. No distress.  Patient has a history of a CVA and looks older than his stated age of 58. He indicates he has not had his blood pressure medicine this afternoon  HENT:  Head: Normocephalic and atraumatic.  Eyes: Conjunctivae and EOM are normal. Pupils are equal, round, and reactive to light. Right eye exhibits no discharge. Left eye exhibits no discharge. No scleral icterus.  Neck: Normal range of motion. Neck supple. No thyromegaly present.  Cardiovascular: Normal rate, regular rhythm and normal heart sounds.  Exam reveals no gallop and no friction rub.   No murmur heard. At 96 per minute  Pulmonary/Chest: Effort normal and breath sounds normal. No respiratory distress. He  has no wheezes. He has no rales. He exhibits no tenderness.  Abdominal: Soft. Bowel sounds are normal. He exhibits no mass. There is no tenderness. There is no rebound and no guarding.  Genitourinary: Rectum normal, prostate normal and penis normal.  Uncircumcised, minimally enlarged prostate without tenderness to palpation. No rectal masses. The patient does have a very large scrotum bilaterally. Left greater than right because of the size I am not able to detect if this is a hernia or a hydrocele.  Musculoskeletal: Normal range of motion. He exhibits no edema and no tenderness.  Lymphadenopathy:    He has no cervical adenopathy.  Neurological: He is alert and oriented to person, place, and time.  Skin: Skin is warm and dry. No rash noted. No erythema. No pallor.  Psychiatric: He has a normal mood and affect. His behavior is normal. Judgment and thought content normal.   BP 175/124  Pulse 104  Temp(Src) 99.2 F (37.3 C) (Oral)  Ht 5' 7" (1.702 m)  Wt 191 lb (86.637 kg)  BMI 29.91 kg/m2  Results for orders placed in visit on 01/12/14  POCT URINALYSIS DIPSTICK      Result Value Ref Range   Color, UA orange     Clarity, UA clear     Glucose, UA color interference     Bilirubin, UA color interference     Ketones, UA color interence     Spec Grav, UA       Blood, UA       pH, UA         Protein, UA       Urobilinogen, UA       Nitrite, UA       Leukocytes, UA      POCT UA - MICROSCOPIC ONLY      Result Value Ref Range   WBC, Ur, HPF, POC occ     RBC, urine, microscopic 20-30     Bacteria, U Microscopic few     Mucus, UA mod     Epithelial cells, urine per micros occ     Crystals, Ur, HPF, POC neg     Casts, Ur, LPF, POC neg     Yeast, UA neg    POCT CBC      Result Value Ref Range   WBC 5.0  4.6 - 10.2 K/uL   Lymph, poc 1.5  0.6 - 3.4   POC LYMPH PERCENT 29.7  10 - 50 %L   POC Granulocyte 3.2  2 - 6.9   Granulocyte percent 64.2  37 - 80 %G   RBC 5.2  4.69 - 6.13 M/uL    Hemoglobin 16.3  14.1 - 18.1 g/dL   HCT, POC 48.2  43.5 - 53.7 %   MCV 92.6  80 - 97 fL   MCH, POC 31.4 (*) 27 - 31.2 pg   MCHC 33.9  31.8 - 35.4 g/dL   RDW, POC 14.0     Platelet Count, POC 100.0 (*) 142 - 424 K/uL   MPV 8.7  0 - 99.8 fL          Assessment & Plan:  1. Lower abdominal pain - POCT urinalysis dipstick - POCT UA - Microscopic Only - DG Abd 1 View; Future - POCT CBC - BMP8+EGFR - Hepatic function panel - CT Abdomen Pelvis Wo Contrast; Future - US Scrotum; Future - Ambulatory referral to Urology  2. Scrotal swelling - US Scrotum; Future - Ambulatory referral to Urology  3. Hematuria - Ambulatory referral to Urology  Patient Instructions  Drink plenty of fluids Avoid milk cheese ice cream and dairy product Avoid caffeine Only take Tylenol for pain if needed We will arrange for you to get some special studies of your abdomen And also the genitalia Because of the blood in the urine, we will also arrange for you to see a urologist    Arrie Senate MD

## 2014-01-12 NOTE — Patient Instructions (Addendum)
Drink plenty of fluids Avoid milk cheese ice cream and dairy product Avoid caffeine Only take Tylenol for pain if needed We will arrange for you to get some special studies of your abdomen And also the genitalia Because of the blood in the urine, we will also arrange for you to see a urologist

## 2014-01-13 ENCOUNTER — Telehealth: Payer: Self-pay | Admitting: Family Medicine

## 2014-01-13 ENCOUNTER — Ambulatory Visit (HOSPITAL_COMMUNITY)
Admission: RE | Admit: 2014-01-13 | Discharge: 2014-01-13 | Disposition: A | Discharge status: Home / Self Care | Payer: Medicaid Other | Source: Ambulatory Visit | Attending: Family Medicine | Admitting: Family Medicine

## 2014-01-13 DIAGNOSIS — R748 Abnormal levels of other serum enzymes: Secondary | ICD-10-CM | POA: Diagnosis not present

## 2014-01-13 DIAGNOSIS — N508 Other specified disorders of male genital organs: Secondary | ICD-10-CM | POA: Insufficient documentation

## 2014-01-13 DIAGNOSIS — R1904 Left lower quadrant abdominal swelling, mass and lump: Secondary | ICD-10-CM | POA: Diagnosis not present

## 2014-01-13 DIAGNOSIS — R319 Hematuria, unspecified: Secondary | ICD-10-CM

## 2014-01-13 DIAGNOSIS — R9389 Abnormal findings on diagnostic imaging of other specified body structures: Secondary | ICD-10-CM | POA: Diagnosis not present

## 2014-01-13 DIAGNOSIS — R109 Unspecified abdominal pain: Secondary | ICD-10-CM | POA: Diagnosis present

## 2014-01-13 DIAGNOSIS — N5089 Other specified disorders of the male genital organs: Secondary | ICD-10-CM

## 2014-01-13 DIAGNOSIS — K7689 Other specified diseases of liver: Secondary | ICD-10-CM | POA: Insufficient documentation

## 2014-01-13 DIAGNOSIS — R1030 Lower abdominal pain, unspecified: Secondary | ICD-10-CM

## 2014-01-13 DIAGNOSIS — N390 Urinary tract infection, site not specified: Secondary | ICD-10-CM

## 2014-01-13 LAB — HEPATIC FUNCTION PANEL
ALT: 113 IU/L — AB (ref 0–44)
AST: 173 IU/L — AB (ref 0–40)
Albumin: 4.2 g/dL (ref 3.5–5.5)
Alkaline Phosphatase: 159 IU/L — ABNORMAL HIGH (ref 39–117)
Bilirubin, Direct: 0.67 mg/dL — ABNORMAL HIGH (ref 0.00–0.40)
Total Bilirubin: 1.8 mg/dL — ABNORMAL HIGH (ref 0.0–1.2)
Total Protein: 7.4 g/dL (ref 6.0–8.5)

## 2014-01-13 LAB — BMP8+EGFR
BUN/Creatinine Ratio: 8 — ABNORMAL LOW (ref 9–20)
BUN: 11 mg/dL (ref 6–24)
CHLORIDE: 94 mmol/L — AB (ref 97–108)
CO2: 26 mmol/L (ref 18–29)
Calcium: 9.3 mg/dL (ref 8.7–10.2)
Creatinine, Ser: 1.4 mg/dL — ABNORMAL HIGH (ref 0.76–1.27)
GFR calc Af Amer: 64 mL/min/{1.73_m2} (ref >59)
GFR calc non Af Amer: 55 mL/min/{1.73_m2} — ABNORMAL LOW (ref >59)
Glucose: 153 mg/dL — ABNORMAL HIGH (ref 65–99)
Potassium: 3.9 mmol/L (ref 3.5–5.2)
Sodium: 137 mmol/L (ref 134–144)

## 2014-01-13 MED ORDER — IOHEXOL 300 MG/ML  SOLN
125.0000 mL | Freq: Once | INTRAMUSCULAR | Status: AC | PRN
  Administered 2014-01-13: 125 mL via INTRAVENOUS

## 2014-01-13 MED ORDER — SODIUM CHLORIDE 0.9 % IV SOLN
INTRAVENOUS | Status: AC
  Filled 2014-01-13: qty 250

## 2014-01-13 NOTE — Telephone Encounter (Signed)
Still waiting on approval- pt aware

## 2014-01-13 NOTE — Addendum Note (Signed)
Addended by: Zannie Cove on: 01/13/2014 12:14 PM   Modules accepted: Orders

## 2014-01-14 LAB — URINE CULTURE

## 2014-03-06 ENCOUNTER — Other Ambulatory Visit: Payer: Self-pay | Admitting: Family Medicine

## 2014-03-07 ENCOUNTER — Ambulatory Visit (INDEPENDENT_AMBULATORY_CARE_PROVIDER_SITE_OTHER): Payer: Medicaid Other | Admitting: Urology

## 2014-03-07 DIAGNOSIS — K4090 Unilateral inguinal hernia, without obstruction or gangrene, not specified as recurrent: Secondary | ICD-10-CM

## 2014-03-07 DIAGNOSIS — R312 Other microscopic hematuria: Secondary | ICD-10-CM

## 2014-03-13 ENCOUNTER — Other Ambulatory Visit: Payer: Self-pay | Admitting: Family Medicine

## 2014-03-13 ENCOUNTER — Telehealth: Payer: Self-pay | Admitting: *Deleted

## 2014-03-13 MED ORDER — ONDANSETRON 4 MG PO TBDP: 4.0000 mg | ORAL_TABLET | Freq: Three times a day (TID) | ORAL | Status: DC | PRN

## 2014-03-13 NOTE — Telephone Encounter (Signed)
Zofran sent to pharm

## 2014-03-13 NOTE — Telephone Encounter (Signed)
Received fax from [facility] for a refill on Zofran 4mg  1 tab po q6hrs #12. Do not see on current med list and do not see in history. Please advise on refill

## 2014-05-18 ENCOUNTER — Ambulatory Visit (INDEPENDENT_AMBULATORY_CARE_PROVIDER_SITE_OTHER): Payer: Medicaid Other | Admitting: Family Medicine

## 2014-05-18 ENCOUNTER — Encounter: Payer: Self-pay | Admitting: Family Medicine

## 2014-05-18 VITALS — BP 150/104 | HR 71 | Temp 97.6°F | Ht 67.0 in | Wt 196.0 lb

## 2014-05-18 DIAGNOSIS — G40909 Epilepsy, unspecified, not intractable, without status epilepticus: Secondary | ICD-10-CM

## 2014-05-18 DIAGNOSIS — E785 Hyperlipidemia, unspecified: Secondary | ICD-10-CM

## 2014-05-18 DIAGNOSIS — Z1211 Encounter for screening for malignant neoplasm of colon: Secondary | ICD-10-CM

## 2014-05-18 DIAGNOSIS — I10 Essential (primary) hypertension: Secondary | ICD-10-CM

## 2014-05-18 MED ORDER — FUROSEMIDE 40 MG PO TABS: 40.0000 mg | ORAL_TABLET | Freq: Every day | ORAL | Status: DC

## 2014-05-18 MED ORDER — TOPROL XL 50 MG PO TB24: 50.0000 mg | ORAL_TABLET | Freq: Every day | ORAL | Status: DC

## 2014-05-18 MED ORDER — CLONIDINE HCL 0.2 MG PO TABS: 0.2000 mg | ORAL_TABLET | Freq: Two times a day (BID) | ORAL | Status: DC

## 2014-05-18 MED ORDER — POTASSIUM CHLORIDE ER 10 MEQ PO TBCR: EXTENDED_RELEASE_TABLET | ORAL | Status: DC

## 2014-05-18 MED ORDER — AMLODIPINE BESYLATE 10 MG PO TABS: 10.0000 mg | ORAL_TABLET | Freq: Every day | ORAL | Status: DC

## 2014-05-18 MED ORDER — TOPIRAMATE 25 MG PO TABS: 25.0000 mg | ORAL_TABLET | Freq: Two times a day (BID) | ORAL | Status: DC

## 2014-05-18 MED ORDER — LISINOPRIL-HYDROCHLOROTHIAZIDE 10-12.5 MG PO TABS: 1.0000 | ORAL_TABLET | Freq: Every day | ORAL | Status: DC

## 2014-05-18 MED ORDER — TRIAMTERENE-HCTZ 37.5-25 MG PO TABS: 1.0000 | ORAL_TABLET | Freq: Every day | ORAL | Status: DC

## 2014-05-18 MED ORDER — HYDRALAZINE HCL 25 MG PO TABS: 25.0000 mg | ORAL_TABLET | Freq: Every day | ORAL | Status: DC

## 2014-05-18 NOTE — Patient Instructions (Addendum)
Continue current medications. Continue good therapeutic lifestyle changes which include good diet and exercise. Fall precautions discussed with patient. If an FOBT was given today- please return it to our front desk. If you are over 58 years old - you may need Prevnar 20 or the adult Pneumonia vaccine.  Flu Shots will be available at our office starting mid- September. Please call and schedule a FLU CLINIC APPOINTMENT.   STOP the Clonidine!!!  We will call in Lisinopril / HCTZ to replace this - this will now be one a day  We will call you with you lab results.

## 2014-05-18 NOTE — Progress Notes (Signed)
   Subjective:    Patient ID: Philip [name], male    DOB: 1955-12-24, 58 y.o.   MRN: [id number]  HPI Pt here for follow up and management of chronic medical problems.         Patient Active Problem List   Diagnosis Date Noted  . Tobacco user   . Erectile dysfunction   . Chronic cholecystitis with calculus 02/04/2013  . Umbilical hernia 67/04/4579  . Gallstones 01/27/2013  . Hyperlipidemia 12/13/2012  . OSA (obstructive sleep apnea) 11/03/2012  . HTN (hypertension) 09/16/2012  . CVA (cerebral vascular accident) 09/16/2012  . History of seizure disorder, since CVA 09/16/2012  . Unspecified asthma(493.90) 09/16/2012   Outpatient Encounter Prescriptions as of 05/18/2014  Medication Sig  . amLODipine (NORVASC) 10 MG tablet Take 1 tablet (10 mg total) by mouth daily.  Marland Kitchen aspirin EC 81 MG tablet Take 81 mg by mouth daily.  . Cholecalciferol (VITAMIN D3) 50000 UNITS CAPS Take 50,000 Units by mouth once a week.  . cloNIDine (CATAPRES) 0.2 MG tablet Take 1 tablet (0.2 mg total) by mouth 2 (two) times daily.  . fish oil-omega-3 fatty acids 1000 MG capsule Take 2 g by mouth daily.  . furosemide (LASIX) 40 MG tablet TAKE ONE TABLET BY MOUTH ONCE DAILY  . hydrALAZINE (APRESOLINE) 25 MG tablet Take 1 tablet (25 mg total) by mouth daily.  . potassium chloride (K-DUR) 10 MEQ tablet TAKE ONE TABLET BY MOUTH TWICE DAILY  . topiramate (TOPAMAX) 25 MG tablet Take 25 mg by mouth 2 (two) times daily.  . TOPROL XL 50 MG 24 hr tablet TAKE ONE TABLET BY MOUTH ONCE DAILY  . triamterene-hydrochlorothiazide (MAXZIDE-25) 37.5-25 MG per tablet Take 1 tablet by mouth daily.  . ondansetron (ZOFRAN ODT) 4 MG disintegrating tablet Take 1 tablet (4 mg total) by mouth every 8 (eight) hours as needed for nausea or vomiting. (Patient not taking: Reported on 05/18/2014)    Review of Systems  Constitutional: Negative.   HENT: Negative.   Eyes: Negative.   Respiratory: Negative.   Cardiovascular: Negative.     Gastrointestinal: Negative.   Endocrine: Negative.   Genitourinary: Negative.   Musculoskeletal: Negative.   Skin: Negative.   Allergic/Immunologic: Negative.   Neurological: Negative.   Hematological: Negative.   Psychiatric/Behavioral: Negative.        Objective:   Physical Exam BP 150/104 mmHg  Pulse 71  Temp(Src) 97.6 F (36.4 C) (Oral)  Ht 5\' 7"  (1.702 m)  Wt 196 lb (88.905 kg)  BMI 30.69 kg/m2        Assessment & Plan:

## 2014-05-18 NOTE — Progress Notes (Signed)
   Subjective:    Patient ID: Philip [name], male    DOB: 06/24/1955, 58 y.o.   MRN: [id number]  HPI -year-old gentleman here to follow-up hypertension and hyperlipidemia. I am not sure about compliance. For example, he takes his cholesterol medicine sporadically and it sounds like he only takes clonidine twice a day for blood pressure. There are several other medicines listed that he may or may not take for control of his blood pressure, which is elevated today we talked about the clonidine affected areas twice a day and makes him sleepy. I think we could probably move to a once a day medicine without side effects.  He had a stroke about 7 years ago with left-sided weakness and still has some occasional weakness, problems with cognition, and balance issues.  He also asked for refill on Levitra. We talked about the generic sildenafil and I showed him how that might be more affordable generically.    Review of Systems  Constitutional: Negative.   HENT: Negative.   Eyes: Negative.   Respiratory: Negative.  Negative for shortness of breath.   Cardiovascular: Negative.  Negative for chest pain and leg swelling.  Gastrointestinal: Negative.   Genitourinary: Negative.   Musculoskeletal: Negative.   Skin: Negative.   Neurological: Negative.   Psychiatric/Behavioral: Negative.   All other systems reviewed and are negative.      Objective:   Physical Exam  Constitutional: He is oriented to person, place, and time. He appears well-developed and well-nourished.  HENT:  Head: Normocephalic.  Right Ear: External ear normal.  Left Ear: External ear normal.  Nose: Nose normal.  Mouth/Throat: Oropharynx is clear and moist.  Eyes: Conjunctivae and EOM are normal. Pupils are equal, round, and reactive to light.  Neck: Normal range of motion. Neck supple.  Cardiovascular: Normal rate, regular rhythm, normal heart sounds and intact distal pulses.   Pulmonary/Chest: Effort normal and breath sounds  normal.  Abdominal: Soft. Bowel sounds are normal.  Musculoskeletal: Normal range of motion.  Neurological: He is alert and oriented to person, place, and time.  Skin: Skin is warm and dry.  Psychiatric: He has a normal mood and affect. His behavior is normal. Judgment and thought content normal.          Assessment & Plan:  1. Essential hypertension Discontinue clonidine and substitute lisinopril HCTZ 10-12.5. - CMP14+EGFR - Lipid panel  2. Seizure disorder No recent seizures and on no medicine  3. Hyperlipidemia Need to check lipids and liver since it's been more than a year there is also suggestion that he may have elevated glucose and diabetes the specimen this morning his fasting - CMP14+EGFR - Lipid panel  4. Screen for colon cancer   Stephen M Miller MD - Ambulatory referral to Gastroenterology 

## 2014-05-19 LAB — CMP14+EGFR
ALK PHOS: 112 IU/L (ref 39–117)
ALT: 69 IU/L — AB (ref 0–44)
AST: 83 IU/L — AB (ref 0–40)
Albumin/Globulin Ratio: 0.9 — ABNORMAL LOW (ref 1.1–2.5)
Albumin: 3.6 g/dL (ref 3.5–5.5)
BUN/Creatinine Ratio: 7 — ABNORMAL LOW (ref 9–20)
BUN: 9 mg/dL (ref 6–24)
CALCIUM: 9.2 mg/dL (ref 8.7–10.2)
CHLORIDE: 98 mmol/L (ref 97–108)
CO2: 23 mmol/L (ref 18–29)
CREATININE: 1.31 mg/dL — AB (ref 0.76–1.27)
GFR calc Af Amer: 69 mL/min/{1.73_m2} (ref >59)
GFR calc non Af Amer: 60 mL/min/{1.73_m2} (ref >59)
GLOBULIN, TOTAL: 3.8 g/dL (ref 1.5–4.5)
Glucose: 188 mg/dL — ABNORMAL HIGH (ref 65–99)
POTASSIUM: 4 mmol/L (ref 3.5–5.2)
Sodium: 137 mmol/L (ref 134–144)
Total Bilirubin: 0.5 mg/dL (ref 0.0–1.2)
Total Protein: 7.4 g/dL (ref 6.0–8.5)

## 2014-05-19 LAB — LIPID PANEL
Chol/HDL Ratio: 4.3 ratio units (ref 0.0–5.0)
Cholesterol, Total: 180 mg/dL (ref 100–199)
HDL: 42 mg/dL (ref >39)
LDL Calculated: 100 mg/dL — ABNORMAL HIGH (ref 0–99)
TRIGLYCERIDES: 191 mg/dL — AB (ref 0–149)
VLDL Cholesterol Cal: 38 mg/dL (ref 5–40)

## 2014-05-23 ENCOUNTER — Encounter: Payer: Self-pay | Admitting: Internal Medicine

## 2014-05-24 ENCOUNTER — Other Ambulatory Visit: Payer: Self-pay | Admitting: *Deleted

## 2014-05-24 MED ORDER — METOPROLOL SUCCINATE ER 50 MG PO TB24: 50.0000 mg | ORAL_TABLET | Freq: Every day | ORAL | Status: DC

## 2014-06-06 ENCOUNTER — Telehealth: Payer: Self-pay | Admitting: Family Medicine

## 2014-06-06 ENCOUNTER — Encounter (HOSPITAL_COMMUNITY): Payer: Self-pay | Admitting: Cardiology

## 2014-06-06 ENCOUNTER — Inpatient Hospital Stay (HOSPITAL_COMMUNITY)
Admission: EM | Admit: 2014-06-06 | Discharge: 2014-06-08 | DRG: 684 | Disposition: A | Discharge status: Home / Self Care | Payer: Medicaid Other | Attending: Internal Medicine | Admitting: Internal Medicine

## 2014-06-06 DIAGNOSIS — I129 Hypertensive chronic kidney disease with stage 1 through stage 4 chronic kidney disease, or unspecified chronic kidney disease: Secondary | ICD-10-CM | POA: Diagnosis present

## 2014-06-06 DIAGNOSIS — Z9049 Acquired absence of other specified parts of digestive tract: Secondary | ICD-10-CM | POA: Diagnosis present

## 2014-06-06 DIAGNOSIS — G4730 Sleep apnea, unspecified: Secondary | ICD-10-CM | POA: Diagnosis present

## 2014-06-06 DIAGNOSIS — R42 Dizziness and giddiness: Secondary | ICD-10-CM

## 2014-06-06 DIAGNOSIS — R809 Proteinuria, unspecified: Secondary | ICD-10-CM | POA: Diagnosis present

## 2014-06-06 DIAGNOSIS — E1165 Type 2 diabetes mellitus with hyperglycemia: Secondary | ICD-10-CM | POA: Diagnosis present

## 2014-06-06 DIAGNOSIS — N2581 Secondary hyperparathyroidism of renal origin: Secondary | ICD-10-CM | POA: Diagnosis present

## 2014-06-06 DIAGNOSIS — E876 Hypokalemia: Secondary | ICD-10-CM | POA: Diagnosis not present

## 2014-06-06 DIAGNOSIS — G43909 Migraine, unspecified, not intractable, without status migrainosus: Secondary | ICD-10-CM | POA: Diagnosis present

## 2014-06-06 DIAGNOSIS — N170 Acute kidney failure with tubular necrosis: Principal | ICD-10-CM | POA: Diagnosis present

## 2014-06-06 DIAGNOSIS — F17210 Nicotine dependence, cigarettes, uncomplicated: Secondary | ICD-10-CM | POA: Diagnosis present

## 2014-06-06 DIAGNOSIS — N179 Acute kidney failure, unspecified: Secondary | ICD-10-CM | POA: Diagnosis present

## 2014-06-06 DIAGNOSIS — N183 Chronic kidney disease, stage 3 (moderate): Secondary | ICD-10-CM | POA: Diagnosis present

## 2014-06-06 DIAGNOSIS — I10 Essential (primary) hypertension: Secondary | ICD-10-CM | POA: Diagnosis present

## 2014-06-06 DIAGNOSIS — R55 Syncope and collapse: Secondary | ICD-10-CM

## 2014-06-06 DIAGNOSIS — Z7982 Long term (current) use of aspirin: Secondary | ICD-10-CM | POA: Diagnosis not present

## 2014-06-06 DIAGNOSIS — N529 Male erectile dysfunction, unspecified: Secondary | ICD-10-CM | POA: Diagnosis present

## 2014-06-06 DIAGNOSIS — R739 Hyperglycemia, unspecified: Secondary | ICD-10-CM | POA: Diagnosis present

## 2014-06-06 DIAGNOSIS — D649 Anemia, unspecified: Secondary | ICD-10-CM | POA: Diagnosis present

## 2014-06-06 DIAGNOSIS — Z8673 Personal history of transient ischemic attack (TIA), and cerebral infarction without residual deficits: Secondary | ICD-10-CM

## 2014-06-06 DIAGNOSIS — Z720 Tobacco use: Secondary | ICD-10-CM

## 2014-06-06 DIAGNOSIS — E860 Dehydration: Secondary | ICD-10-CM | POA: Diagnosis present

## 2014-06-06 DIAGNOSIS — E119 Type 2 diabetes mellitus without complications: Secondary | ICD-10-CM

## 2014-06-06 LAB — BASIC METABOLIC PANEL
Anion gap: 11 (ref 5–15)
BUN: 25 mg/dL — AB (ref 6–23)
CALCIUM: 9.7 mg/dL (ref 8.4–10.5)
CHLORIDE: 95 meq/L — AB (ref 96–112)
CO2: 31 mmol/L (ref 19–32)
Creatinine, Ser: 4.21 mg/dL — ABNORMAL HIGH (ref 0.50–1.35)
GFR calc Af Amer: 17 mL/min — ABNORMAL LOW (ref >90)
GFR, EST NON AFRICAN AMERICAN: 14 mL/min — AB (ref >90)
Glucose, Bld: 210 mg/dL — ABNORMAL HIGH (ref 70–99)
Potassium: 3.3 mmol/L — ABNORMAL LOW (ref 3.5–5.1)
Sodium: 137 mmol/L (ref 135–145)

## 2014-06-06 LAB — CBC WITH DIFFERENTIAL/PLATELET
BASOS ABS: 0.1 10*3/uL (ref 0.0–0.1)
Basophils Relative: 1 % (ref 0–1)
EOS ABS: 0.5 10*3/uL (ref 0.0–0.7)
Eosinophils Relative: 6 % — ABNORMAL HIGH (ref 0–5)
HCT: 53.3 % — ABNORMAL HIGH (ref 39.0–52.0)
HEMOGLOBIN: 18.6 g/dL — AB (ref 13.0–17.0)
Lymphocytes Relative: 30 % (ref 12–46)
Lymphs Abs: 2.6 10*3/uL (ref 0.7–4.0)
MCH: 32.2 pg (ref 26.0–34.0)
MCHC: 34.9 g/dL (ref 30.0–36.0)
MCV: 92.2 fL (ref 78.0–100.0)
Monocytes Absolute: 0.7 10*3/uL (ref 0.1–1.0)
Monocytes Relative: 8 % (ref 3–12)
Neutro Abs: 5 10*3/uL (ref 1.7–7.7)
Neutrophils Relative %: 57 % (ref 43–77)
PLATELETS: 151 10*3/uL (ref 150–400)
RBC: 5.78 MIL/uL (ref 4.22–5.81)
RDW: 13.6 % (ref 11.5–15.5)
WBC: 8.8 10*3/uL (ref 4.0–10.5)

## 2014-06-06 LAB — URINALYSIS, ROUTINE W REFLEX MICROSCOPIC
GLUCOSE, UA: 100 mg/dL — AB
Hgb urine dipstick: NEGATIVE
Leukocytes, UA: NEGATIVE
NITRITE: NEGATIVE
PH: 5.5 (ref 5.0–8.0)
Protein, ur: 30 mg/dL — AB
Specific Gravity, Urine: >1.03 — ABNORMAL HIGH (ref 1.005–1.030)
Urobilinogen, UA: 1 mg/dL (ref 0.0–1.0)

## 2014-06-06 LAB — URINE MICROSCOPIC-ADD ON

## 2014-06-06 MED ORDER — CLONIDINE HCL 0.2 MG PO TABS
0.2000 mg | ORAL_TABLET | Freq: Two times a day (BID) | ORAL | Status: DC
  Filled 2014-06-06: qty 1

## 2014-06-06 MED ORDER — ONDANSETRON HCL 4 MG PO TABS: 4.0000 mg | ORAL_TABLET | Freq: Four times a day (QID) | ORAL | Status: DC | PRN

## 2014-06-06 MED ORDER — ENOXAPARIN SODIUM 30 MG/0.3ML ~~LOC~~ SOLN
30.0000 mg | SUBCUTANEOUS | Status: DC
  Administered 2014-06-06: 30 mg via SUBCUTANEOUS
  Filled 2014-06-06: qty 0.3

## 2014-06-06 MED ORDER — AMLODIPINE BESYLATE 5 MG PO TABS
10.0000 mg | ORAL_TABLET | Freq: Every day | ORAL | Status: DC
  Administered 2014-06-07 – 2014-06-08 (×2): 10 mg via ORAL
  Filled 2014-06-06 (×2): qty 2

## 2014-06-06 MED ORDER — HYDRALAZINE HCL 25 MG PO TABS
25.0000 mg | ORAL_TABLET | Freq: Every day | ORAL | Status: DC
  Administered 2014-06-07 – 2014-06-08 (×2): 25 mg via ORAL
  Filled 2014-06-06 (×2): qty 1

## 2014-06-06 MED ORDER — SODIUM CHLORIDE 0.9 % IV BOLUS (SEPSIS)
500.0000 mL | Freq: Once | INTRAVENOUS | Status: AC
  Administered 2014-06-06: 500 mL via INTRAVENOUS

## 2014-06-06 MED ORDER — SODIUM CHLORIDE 0.9 % IV BOLUS (SEPSIS): 1000.0000 mL | Freq: Once | INTRAVENOUS | Status: DC

## 2014-06-06 MED ORDER — METOPROLOL SUCCINATE ER 50 MG PO TB24
50.0000 mg | ORAL_TABLET | Freq: Every day | ORAL | Status: DC
  Administered 2014-06-06 – 2014-06-07 (×2): 50 mg via ORAL
  Filled 2014-06-06 (×3): qty 1

## 2014-06-06 MED ORDER — ASPIRIN EC 81 MG PO TBEC
81.0000 mg | DELAYED_RELEASE_TABLET | Freq: Every day | ORAL | Status: DC
  Administered 2014-06-06 – 2014-06-08 (×3): 81 mg via ORAL
  Filled 2014-06-06 (×3): qty 1

## 2014-06-06 MED ORDER — SODIUM CHLORIDE 0.9 % IV SOLN
INTRAVENOUS | Status: DC
  Administered 2014-06-06 – 2014-06-07 (×3): via INTRAVENOUS

## 2014-06-06 MED ORDER — ONDANSETRON HCL 4 MG/2ML IJ SOLN: 4.0000 mg | Freq: Four times a day (QID) | INTRAMUSCULAR | Status: DC | PRN

## 2014-06-06 MED ORDER — TOPIRAMATE 25 MG PO TABS
25.0000 mg | ORAL_TABLET | Freq: Two times a day (BID) | ORAL | Status: DC
  Administered 2014-06-07 – 2014-06-08 (×3): 25 mg via ORAL
  Filled 2014-06-06 (×6): qty 1

## 2014-06-06 MED ORDER — OMEGA-3-ACID ETHYL ESTERS 1 G PO CAPS
2.0000 g | ORAL_CAPSULE | Freq: Every day | ORAL | Status: DC
  Administered 2014-06-07 – 2014-06-08 (×2): 2 g via ORAL
  Filled 2014-06-06 (×4): qty 2

## 2014-06-06 NOTE — ED Notes (Signed)
Lab at bedside

## 2014-06-06 NOTE — Telephone Encounter (Signed)
Patients sister was advised to have him evaluated by the ER she stated that he passed out, had a seizure and is dizzy.

## 2014-06-06 NOTE — Discharge Instructions (Signed)
Hold your clonidine for the next 24 hours then take half the dose until you see her doctor. Keep a log of your blood pressure and symptoms, stay well-hydrated with water If your blood pressure remains 120 or lower then you can hold or half your clonidine. If you were given medicines take as directed.  If you are on coumadin or contraceptives realize their levels and effectiveness is altered by many different medicines.  If you have any reaction (rash, tongues swelling, other) to the medicines stop taking and see a physician.   Please follow up as directed and return to the ER or see a physician for new or worsening symptoms.  Thank you. Filed Vitals:   06/06/14 1316 06/06/14 1530  BP: 100/76 100/66  Pulse: 86 80  Temp: 97.8 F (36.6 C)   TempSrc: Oral   Resp: 22 21  Height: 5\' 7"  (1.702 m)   Weight: 197 lb (89.359 kg)   SpO2: 98% 97%

## 2014-06-06 NOTE — ED Notes (Signed)
Contact if needed:  Michaelene Song (sister):  (c): [phone] (h): [phone]

## 2014-06-06 NOTE — Progress Notes (Signed)
Report received from Hailey in ER.Patient arrived to floor with sister at bedside at 6:48pm.  IV fluids infusing and report to be given to night nurse.

## 2014-06-06 NOTE — ED Notes (Addendum)
Last night went to get up out of recliner and got dizzy,  States "I fell out".   States he hit his right ribs on a table.  Recently had blood pressure medicine changed.  Denies any dizziness right now.

## 2014-06-06 NOTE — H&P (Signed)
[facility] History and Physical  Philip [name] JJH:[id number] DOB: 1955-10-24 DOA: 06/06/2014  Referring physician: ER PCP: Philip Honour, MD   Chief Complaint: Dizziness.  HPI: Philip [name] is a 59 y.o. male  This is a 24 row man has a history of hypertension with a previous history of CVA and is a smoker who now presents with an episode of dizziness yesterday. He stood up and at this point he became lightheaded. He did not lose consciousness. This made him worried and he came to the emergency room. Approximately 3 weeks ago, on Christmas Eve, he was seen by his primary care physician. Apparently, the patient had not been tolerating clonidine as it made him very sleepy and therefore his medication was changed. His clonidine was discontinued and he was started on a combination of lisinopril and hydrochlorothiazide. He has been taking this medication since this time. He denies any lightheadedness or dizziness until yesterday. He has had poor by mouth intake for the last 3 days. He denies any chest pain, dyspnea or palpitations. Evaluation in the emergency room shows him to be in acute renal failure. His creatinine is now 4.21. On Christmas Eve last year, his creatinine was 1.31 prior to initiation of lisinopril and had core thiazide. He is now being admitted for further management.   Review of Systems:  Apart from symptoms above, all other systems are negative.  Past Medical History  Diagnosis Date  . Hypertension     takes Hydralazine,Maxzide,Clonidine,and Amlodipine daily  . Hypertension     also takes Metoprolol daily  . Nausea     takes Zofran daily  . History of migraine     takes Topamax daily  . Sleep apnea     CPAP- tries to do it everyday.  . Stroke     .  Short term memory loss.  . Seizures     takes Dilantin daily.  last one was when he had a stroke.  Marland Kitchen Headache(784.0)     "Headache, I dont think they are migranes"  . Tobacco user   . Erectile dysfunction    Past  Surgical History  Procedure Laterality Date  . No past surgeries    . Cholecystectomy  03/09/2013  . Cholecystectomy N/A 03/09/2013    Procedure: LAPAROSCOPIC CHOLECYSTECTOMY WITH INTRAOPERATIVE CHOLANGIOGRAM;  Surgeon: Philip [name]. Philip Dover, MD;  Location: [place];  Service: General;  Laterality: N/A;  . Umbilical hernia repair N/A 03/09/2013    Procedure: HERNIA REPAIR UMBILICAL ADULT;  Surgeon: Philip [name]. Philip Dover, MD;  Location: [place];  Service: General;  Laterality: N/A;   Social History:  reports that he has been smoking Cigarettes.  He started smoking about 36 years ago. He has a 5 pack-year smoking history. He has never used smokeless tobacco. He reports that he drinks about 2.3 oz of alcohol per week. He reports that he does not use illicit drugs.  No Known Allergies  Family History  Problem Relation Age of Onset  . Heart disease Maternal Grandmother   . Heart disease Maternal Grandfather   . Heart disease Paternal Grandmother   . Heart disease Paternal Grandfather   . Cancer Cousin   . Hypertension Mother      Prior to Admission medications   Medication Sig Start Date End Date Taking? Authorizing Provider  amLODipine (NORVASC) 10 MG tablet Take 1 tablet (10 mg total) by mouth daily. 05/18/14  Yes Philip Honour, MD  aspirin EC 81 MG tablet Take 81 mg by mouth  daily.   Yes Historical Provider, MD  cloNIDine (CATAPRES) 0.2 MG tablet Take 1 tablet (0.2 mg total) by mouth 2 (two) times daily. 05/18/14  Yes Philip Honour, MD  fish oil-omega-3 fatty acids 1000 MG capsule Take 2 g by mouth daily.   Yes Historical Provider, MD  furosemide (LASIX) 40 MG tablet Take 1 tablet (40 mg total) by mouth daily. 05/18/14  Yes Philip Honour, MD  hydrALAZINE (APRESOLINE) 25 MG tablet Take 1 tablet (25 mg total) by mouth daily. 05/18/14  Yes Philip Honour, MD  lisinopril-hydrochlorothiazide (PRINZIDE,ZESTORETIC) 10-12.5 MG per tablet Take 1 tablet by mouth daily. 05/18/14  Yes Philip Herb, MD   metoprolol succinate (TOPROL XL) 50 MG 24 hr tablet Take 1 tablet (50 mg total) by mouth daily. Take with or immediately following a meal. 05/24/14  Yes Philip Honour, MD  potassium chloride (K-DUR) 10 MEQ tablet TAKE ONE TABLET BY MOUTH TWICE DAILY 05/18/14  Yes Philip Honour, MD  topiramate (TOPAMAX) 25 MG tablet Take 1 tablet (25 mg total) by mouth 2 (two) times daily. 05/18/14  Yes Philip Honour, MD  triamterene-hydrochlorothiazide (MAXZIDE-25) 37.5-25 MG per tablet Take 1 tablet by mouth daily. 05/18/14  Yes Philip Honour, MD  Cholecalciferol (VITAMIN D3) 50000 UNITS CAPS Take 50,000 Units by mouth once a week. Patient not taking: Reported on 06/06/2014 12/15/12   Philip Herb, MD  ondansetron (ZOFRAN ODT) 4 MG disintegrating tablet Take 1 tablet (4 mg total) by mouth every 8 (eight) hours as needed for nausea or vomiting. Patient not taking: Reported on 05/18/2014 03/13/14   Philip Penner, FNP   Physical Exam: Filed Vitals:   06/06/14 1316 06/06/14 1530 06/06/14 1758  BP: 100/76 100/66 99/88  Pulse: 86 80 77  Temp: 97.8 F (36.6 C)    TempSrc: Oral    Resp: 22 21 18   Height: 5\' 7"  (1.702 m)    Weight: 89.359 kg (197 lb)    SpO2: 98% 97% 99%    Wt Readings from Last 3 Encounters:  06/06/14 89.359 kg (197 lb)  05/18/14 88.905 kg (196 lb)  01/12/14 86.637 kg (191 lb)    General:  Appears somewhat clinically dehydrated. His blood pressure is borderline low. Eyes: PERRL, normal lids, irises & conjunctiva ENT: grossly normal hearing, lips & tongue Neck: no LAD, masses or thyromegaly Cardiovascular: RRR, no m/r/g. No LE edema. Telemetry: SR, no arrhythmias  Respiratory: CTA bilaterally, no w/r/r. Normal respiratory effort. Abdomen: soft, ntnd Skin: no rash or induration seen on limited exam Musculoskeletal: grossly normal tone BUE/BLE Psychiatric: grossly normal mood and affect, speech fluent and appropriate Neurologic: grossly non-focal.          Labs on  Admission:  Basic Metabolic Panel:  Recent Labs Lab 06/06/14 1621  NA 137  K 3.3*  CL 95*  CO2 31  GLUCOSE 210*  BUN 25*  CREATININE 4.21*  CALCIUM 9.7   Liver Function Tests: No results for input(s): AST, ALT, ALKPHOS, BILITOT, PROT, ALBUMIN in the last 168 hours. No results for input(s): LIPASE, AMYLASE in the last 168 hours. No results for input(s): AMMONIA in the last 168 hours. CBC:  Recent Labs Lab 06/06/14 1621  WBC 8.8  NEUTROABS 5.0  HGB 18.6*  HCT 53.3*  MCV 92.2  PLT 151   Cardiac Enzymes: No results for input(s): CKTOTAL, CKMB, CKMBINDEX, TROPONINI in the last 168 hours.  BNP (last 3 results) No results for input(s): PROBNP in the  last 8760 hours. CBG: No results for input(s): GLUCAP in the last 168 hours.  Radiological Exams on Admission: No results found.  EKG: Independently reviewed. Normal sinus rhythm without any acute ST-T wave changes.  Assessment/Plan   1. Acute renal failure. This is likely caused by the combination of ACE inhibitor and diuretic. I think there is also been some confusion about what medications he should be taking for his blood pressure. His lisinopril, thiazide diuretic, furosemide and Maxide will be held. He will be given intravenous fluids. Ultrasound of the kidneys will be done. We will last nephrology to see this patient in consultation. Monitor renal function closely. 2. Hypertension. Currently he is somewhat hypotensive and his blood pressure will be monitored closely. 3. History of cerebrovascular disease. Stable. 4. Tobacco abuse. He was counseled against smoking.  Further recommendations will depend on patient's hospital progress.   Code Status: Full code.   DVT Prophylaxis: Lovenox.  Family Communication: I discussed the plan with the patient at the bedside.   Disposition Plan: Home when medically stable.   Time spent: 60 minutes.  Doree Albee [facility] Pager [phone].

## 2014-06-06 NOTE — ED Provider Notes (Signed)
CSN: [id number]     Arrival date & time 06/06/14  1255 History   First MD Initiated Contact with Patient 06/06/14 1519     Chief Complaint  Patient presents with  . Dizziness     (Consider location/radiation/quality/duration/timing/severity/associated sxs/prior Treatment) HPI Comments: 59 year old male with history of high blood pressure, stroke, lipids, tobacco use presents with lightheadedness with standing causing him to pass out yesterday evening. No history of similar. No valve disease known. Patient denies blood in the stools, currently no symptoms as he is resting sitting up. Patient recently had lisinopril/hydrochlorothiazide added to his blood pressure management, patient is been on clonidine prior. Patient is not sure if he supposed acute taking clonidine so he did.  Patient is a 59 y.o. male presenting with dizziness. The history is provided by the patient.  Dizziness Associated symptoms: no chest pain, no headaches, no shortness of breath and no vomiting     Past Medical History  Diagnosis Date  . Hypertension     takes Hydralazine,Maxzide,Clonidine,and Amlodipine daily  . Hypertension     also takes Metoprolol daily  . Nausea     takes Zofran daily  . History of migraine     takes Topamax daily  . Sleep apnea     CPAP- tries to do it everyday.  . Stroke     .  Short term memory loss.  . Seizures     takes Dilantin daily.  last one was when he had a stroke.  Marland Kitchen Headache(784.0)     "Headache, I dont think they are migranes"  . Tobacco user   . Erectile dysfunction    Past Surgical History  Procedure Laterality Date  . No past surgeries    . Cholecystectomy  03/09/2013  . Cholecystectomy N/A 03/09/2013    Procedure: LAPAROSCOPIC CHOLECYSTECTOMY WITH INTRAOPERATIVE CHOLANGIOGRAM;  Surgeon: Imogene Burn. Georgette Dover, MD;  Location: [place] [facility];  Service: General;  Laterality: N/A;  . Umbilical hernia repair N/A 03/09/2013    Procedure: HERNIA REPAIR UMBILICAL ADULT;  Surgeon:  Imogene Burn. Georgette Dover, MD;  Location: [facility];  Service: General;  Laterality: N/A;   Family History  Problem Relation Age of Onset  . Heart disease Maternal Grandmother   . Heart disease Maternal Grandfather   . Heart disease Paternal Grandmother   . Heart disease Paternal Grandfather   . Cancer Cousin   . Hypertension Mother    History  Substance Use Topics  . Smoking status: Current Every Day Smoker -- 0.20 packs/day for 25 years    Types: Cigarettes    Start date: 05/26/1978  . Smokeless tobacco: Never Used  . Alcohol Use: 2.3 oz/week    3 Cans of beer, 1 Not specified per week     Comment: OCCASSIONAL    Review of Systems  Constitutional: Positive for fatigue. Negative for fever and chills.  HENT: Negative for congestion.   Eyes: Negative for visual disturbance.  Respiratory: Negative for shortness of breath.   Cardiovascular: Negative for chest pain.  Gastrointestinal: Negative for vomiting and abdominal pain.  Genitourinary: Negative for dysuria and flank pain.  Musculoskeletal: Negative for back pain, neck pain and neck stiffness.  Skin: Negative for rash.  Neurological: Positive for syncope and light-headedness. Negative for dizziness and headaches.      Allergies  Review of patient's allergies indicates no known allergies.  Home Medications   Prior to Admission medications   Medication Sig Start Date End Date Taking? Authorizing Provider  amLODipine (NORVASC) 10 MG tablet  Take 1 tablet (10 mg total) by mouth daily. 05/18/14  Yes Wardell Honour, MD  aspirin EC 81 MG tablet Take 81 mg by mouth daily.   Yes Historical Provider, MD  cloNIDine (CATAPRES) 0.2 MG tablet Take 1 tablet (0.2 mg total) by mouth 2 (two) times daily. 05/18/14  Yes Wardell Honour, MD  fish oil-omega-3 fatty acids 1000 MG capsule Take 2 g by mouth daily.   Yes Historical Provider, MD  furosemide (LASIX) 40 MG tablet Take 1 tablet (40 mg total) by mouth daily. 05/18/14  Yes Wardell Honour, MD   hydrALAZINE (APRESOLINE) 25 MG tablet Take 1 tablet (25 mg total) by mouth daily. 05/18/14  Yes Wardell Honour, MD  lisinopril-hydrochlorothiazide (PRINZIDE,ZESTORETIC) 10-12.5 MG per tablet Take 1 tablet by mouth daily. 05/18/14  Yes Chipper Herb, MD  metoprolol succinate (TOPROL XL) 50 MG 24 hr tablet Take 1 tablet (50 mg total) by mouth daily. Take with or immediately following a meal. 05/24/14  Yes Wardell Honour, MD  potassium chloride (K-DUR) 10 MEQ tablet TAKE ONE TABLET BY MOUTH TWICE DAILY 05/18/14  Yes Wardell Honour, MD  topiramate (TOPAMAX) 25 MG tablet Take 1 tablet (25 mg total) by mouth 2 (two) times daily. 05/18/14  Yes Wardell Honour, MD  triamterene-hydrochlorothiazide (MAXZIDE-25) 37.5-25 MG per tablet Take 1 tablet by mouth daily. 05/18/14  Yes Wardell Honour, MD  Cholecalciferol (VITAMIN D3) 50000 UNITS CAPS Take 50,000 Units by mouth once a week. Patient not taking: Reported on 06/06/2014 12/15/12   Chipper Herb, MD  ondansetron (ZOFRAN ODT) 4 MG disintegrating tablet Take 1 tablet (4 mg total) by mouth every 8 (eight) hours as needed for nausea or vomiting. Patient not taking: Reported on 05/18/2014 03/13/14   Lysbeth Penner, FNP   BP 99/88 mmHg  Pulse 77  Temp(Src) 97.8 F (36.6 C) (Oral)  Resp 18  Ht 5\' 7"  (1.702 m)  Wt 197 lb (89.359 kg)  BMI 30.85 kg/m2  SpO2 99% Physical Exam  Constitutional: He is oriented to person, place, and time. He appears well-developed and well-nourished.  HENT:  Head: Normocephalic and atraumatic.  Mouth/Throat: Oropharyngeal exudate: mild dry mucous membranes.  Eyes: Conjunctivae are normal. Right eye exhibits no discharge. Left eye exhibits no discharge.  Neck: Normal range of motion. Neck supple. No tracheal deviation present.  Cardiovascular: Normal rate and regular rhythm.   No murmur heard. Pulmonary/Chest: Effort normal and breath sounds normal.  Abdominal: Soft. He exhibits no distension. There is no  tenderness. There is no guarding.  Musculoskeletal: He exhibits no edema.  Neurological: He is alert and oriented to person, place, and time.  Skin: Skin is warm. No rash noted.  Psychiatric: He has a normal mood and affect.  Nursing note and vitals reviewed.   ED Course  Procedures (including critical care time) Labs Review Labs Reviewed  BASIC METABOLIC PANEL - Abnormal; Notable for the following:    Potassium 3.3 (*)    Chloride 95 (*)    Glucose, Bld 210 (*)    BUN 25 (*)    Creatinine, Ser 4.21 (*)    GFR calc non Af Amer 14 (*)    GFR calc Af Amer 17 (*)    All other components within normal limits  CBC WITH DIFFERENTIAL - Abnormal; Notable for the following:    Hemoglobin 18.6 (*)    HCT 53.3 (*)    Eosinophils Relative 6 (*)    All other  components within normal limits  URINALYSIS, ROUTINE W REFLEX MICROSCOPIC    Imaging Review No results found.   EKG Interpretation   Date/Time:  Tuesday June 06 2014 13:19:45 EST Ventricular Rate:  84 PR Interval:  160 QRS Duration: 84 QT Interval:  392 QTC Calculation: 463 R Axis:   -35 Text Interpretation:  , interpretation may be  adversely affected Normal sinus rhythm Left axis deviation Pulmonary  disease pattern similar to previous Confirmed by Shaliah Wann  MD, Nicklaus Alviar ([id number])  on 06/06/2014 3:53:41 PM  MDM   Final diagnoses:  Orthostatic dizziness  Syncope, unspecified syncope type  Acute renal failure, unspecified acute renal failure type   Patient presents with intermittent lightheadedness with standing. Clinical concern for orthostatic hypotension as cause of his symptoms with recent blood pressure medicine change. Patient normally hypertensive and in the ER blood pressure 176 systolic. Plan for screening blood work to check hemoglobin and kidney function, oral fluids. Discussed medicine changes in close follow-up for recheck while keeping a log of blood pressure. Patient will hold clonidine and then half the  dose.  Not expected, patient's blood work showed acute renal failure. IV fluids ordered, discussed with patient and hospitalist for admission for further evaluation of this new finding. Patient has no symptoms on recheck. Patient denies NSAID use. Results and differential diagnosis were discussed with the patient/parent/guardian. Close follow up outpatient was discussed, comfortable with the plan.   Medications  sodium chloride 0.9 % bolus 1,000 mL (not administered)  sodium chloride 0.9 % bolus 500 mL (500 mLs Intravenous New Bag/Given 06/06/14 1743)    Filed Vitals:   06/06/14 1316 06/06/14 1530 06/06/14 1758  BP: 100/76 100/66 99/88  Pulse: 86 80 77  Temp: 97.8 F (36.6 C)    TempSrc: Oral    Resp: 22 21 18   Height: 5\' 7"  (1.702 m)    Weight: 197 lb (89.359 kg)    SpO2: 98% 97% 99%    Final diagnoses:  Orthostatic dizziness  Syncope, unspecified syncope type  Acute renal failure, unspecified acute renal failure type       Mariea Clonts, MD 06/06/14 [phone]

## 2014-06-07 ENCOUNTER — Inpatient Hospital Stay (HOSPITAL_COMMUNITY): Payer: Medicaid Other

## 2014-06-07 DIAGNOSIS — R739 Hyperglycemia, unspecified: Secondary | ICD-10-CM | POA: Diagnosis present

## 2014-06-07 DIAGNOSIS — R809 Proteinuria, unspecified: Secondary | ICD-10-CM | POA: Diagnosis present

## 2014-06-07 DIAGNOSIS — N179 Acute kidney failure, unspecified: Secondary | ICD-10-CM | POA: Insufficient documentation

## 2014-06-07 LAB — COMPREHENSIVE METABOLIC PANEL
ALK PHOS: 91 U/L (ref 39–117)
ALT: 41 U/L (ref 0–53)
AST: 42 U/L — AB (ref 0–37)
Albumin: 3.4 g/dL — ABNORMAL LOW (ref 3.5–5.2)
Anion gap: 10 (ref 5–15)
BILIRUBIN TOTAL: 0.8 mg/dL (ref 0.3–1.2)
BUN: 23 mg/dL (ref 6–23)
CHLORIDE: 97 meq/L (ref 96–112)
CO2: 29 mmol/L (ref 19–32)
CREATININE: 2.6 mg/dL — AB (ref 0.50–1.35)
Calcium: 8.8 mg/dL (ref 8.4–10.5)
GFR calc Af Amer: 30 mL/min — ABNORMAL LOW (ref >90)
GFR calc non Af Amer: 26 mL/min — ABNORMAL LOW (ref >90)
Glucose, Bld: 152 mg/dL — ABNORMAL HIGH (ref 70–99)
POTASSIUM: 3.2 mmol/L — AB (ref 3.5–5.1)
Sodium: 136 mmol/L (ref 135–145)
Total Protein: 7 g/dL (ref 6.0–8.3)

## 2014-06-07 LAB — HEMOGLOBIN A1C
HEMOGLOBIN A1C: 8.4 % — AB (ref <5.7)
MEAN PLASMA GLUCOSE: 194 mg/dL — AB (ref <117)

## 2014-06-07 LAB — CBC
HCT: 46.7 % (ref 39.0–52.0)
Hemoglobin: 15.7 g/dL (ref 13.0–17.0)
MCH: 30.9 pg (ref 26.0–34.0)
MCHC: 33.6 g/dL (ref 30.0–36.0)
MCV: 91.9 fL (ref 78.0–100.0)
Platelets: 139 10*3/uL — ABNORMAL LOW (ref 150–400)
RBC: 5.08 MIL/uL (ref 4.22–5.81)
RDW: 13.5 % (ref 11.5–15.5)
WBC: 6.5 10*3/uL (ref 4.0–10.5)

## 2014-06-07 LAB — RAPID URINE DRUG SCREEN, HOSP PERFORMED
Amphetamines: NOT DETECTED
Barbiturates: NOT DETECTED
Benzodiazepines: NOT DETECTED
COCAINE: NOT DETECTED
Opiates: NOT DETECTED
Tetrahydrocannabinol: NOT DETECTED

## 2014-06-07 MED ORDER — POTASSIUM CHLORIDE CRYS ER 20 MEQ PO TBCR
40.0000 meq | EXTENDED_RELEASE_TABLET | Freq: Once | ORAL | Status: AC
  Administered 2014-06-07: 40 meq via ORAL
  Filled 2014-06-07: qty 2

## 2014-06-07 MED ORDER — ENOXAPARIN SODIUM 40 MG/0.4ML ~~LOC~~ SOLN
40.0000 mg | SUBCUTANEOUS | Status: DC
  Administered 2014-06-07: 40 mg via SUBCUTANEOUS
  Filled 2014-06-07: qty 0.4

## 2014-06-07 MED ORDER — DM-GUAIFENESIN ER 30-600 MG PO TB12
2.0000 | ORAL_TABLET | Freq: Two times a day (BID) | ORAL | Status: DC
  Administered 2014-06-07 – 2014-06-08 (×2): 2 via ORAL
  Filled 2014-06-07 (×2): qty 2

## 2014-06-07 NOTE — Consult Note (Signed)
Philip [name] MRN: [id number] DOB/AGE: 1955-11-30 59 y.o. Primary Care Physician:MILLER, Lillette Boxer, MD Admit date: 06/06/2014 Chief Complaint:  Chief Complaint  Patient presents with  . Dizziness   HPI: Pt is 59 year old male with past medical hx of HTN who presented to ER with c/o dizziness.  HPI dates back to  yesterday when pt had an episode of dizziness and presented to ER. Pt also c/o decreased po intake from past few days Pt also gave hx of  recent change of medication,pt was started on Lisinopril and HCTZ . Pt was taking his meds as prescribed NO c/o hematuria NO c/o fever/cough/chil NO c/o chest pain  No c/o head trauma NO c/o syncope  No c/p change in speech and vision  Pt says " I feel good"  Past Medical History  Diagnosis Date  . Hypertension     takes Hydralazine,Maxzide,Clonidine,and Amlodipine daily  . Hypertension     also takes Metoprolol daily  . Nausea     takes Zofran daily  . History of migraine     takes Topamax daily  . Sleep apnea     CPAP- tries to do it everyday.  . Stroke     .  Short term memory loss.  . Seizures     takes Dilantin daily.  last one was when he had a stroke.  Marland Kitchen Headache(784.0)     "Headache, I dont think they are migranes"  . Tobacco user   . Erectile dysfunction         Family History  Problem Relation Age of Onset  . Heart disease Maternal Grandmother   . Heart disease Maternal Grandfather   . Heart disease Paternal Grandmother   . Heart disease Paternal Grandfather   . Cancer Cousin   . Hypertension Mother     Social History:  reports that he has been smoking Cigarettes.  He started smoking about 36 years ago. He has a 5 pack-year smoking history. He has never used smokeless tobacco. He reports that he drinks about 2.3 oz of alcohol per week. He reports that he does not use illicit drugs.   Allergies: No Known Allergies  Medications Prior to Admission  Medication Sig Dispense Refill  . amLODipine (NORVASC) 10  MG tablet Take 1 tablet (10 mg total) by mouth daily. 30 tablet 4  . aspirin EC 81 MG tablet Take 81 mg by mouth daily.    . cloNIDine (CATAPRES) 0.2 MG tablet Take 1 tablet (0.2 mg total) by mouth 2 (two) times daily. 60 tablet 4  . fish oil-omega-3 fatty acids 1000 MG capsule Take 2 g by mouth daily.    . furosemide (LASIX) 40 MG tablet Take 1 tablet (40 mg total) by mouth daily. 30 tablet 4  . hydrALAZINE (APRESOLINE) 25 MG tablet Take 1 tablet (25 mg total) by mouth daily. 30 tablet 4  . lisinopril-hydrochlorothiazide (PRINZIDE,ZESTORETIC) 10-12.5 MG per tablet Take 1 tablet by mouth daily. 30 tablet 4  . metoprolol succinate (TOPROL XL) 50 MG 24 hr tablet Take 1 tablet (50 mg total) by mouth daily. Take with or immediately following a meal. 30 tablet 4  . potassium chloride (K-DUR) 10 MEQ tablet TAKE ONE TABLET BY MOUTH TWICE DAILY 60 tablet 4  . topiramate (TOPAMAX) 25 MG tablet Take 1 tablet (25 mg total) by mouth 2 (two) times daily. 60 tablet 4  . triamterene-hydrochlorothiazide (MAXZIDE-25) 37.5-25 MG per tablet Take 1 tablet by mouth daily. 30 tablet 4  . Cholecalciferol (  VITAMIN D3) 50000 UNITS CAPS Take 50,000 Units by mouth once a week. (Patient not taking: Reported on 06/06/2014) 4 capsule 4  . ondansetron (ZOFRAN ODT) 4 MG disintegrating tablet Take 1 tablet (4 mg total) by mouth every 8 (eight) hours as needed for nausea or vomiting. (Patient not taking: Reported on 05/18/2014) 20 tablet 0       SPQ:ZRAQT from the symptoms mentioned above,there are no other symptoms referable to all systems reviewed.  Marland Kitchen amLODipine  10 mg Oral Daily  . aspirin EC  81 mg Oral Daily  . cloNIDine  0.2 mg Oral BID  . enoxaparin (LOVENOX) injection  30 mg Subcutaneous Q24H  . hydrALAZINE  25 mg Oral Daily  . metoprolol succinate  50 mg Oral QHS  . omega-3 acid ethyl esters  2 g Oral Daily  . sodium chloride  1,000 mL Intravenous Once  . topiramate  25 mg Oral BID       Physical  Exam: Vital signs in last 24 hours: Temp:  [97.8 F (36.6 C)-98.7 F (37.1 C)] 98.4 F (36.9 C) (01/13 0949) Pulse Rate:  [66-86] 69 (01/13 0949) Resp:  [18-22] 19 (01/13 0949) BP: (99-136)/(66-91) 136/91 mmHg (01/13 0949) SpO2:  [97 %-100 %] 100 % (01/13 0949) Weight:  [187 lb 4.8 oz (84.959 kg)-197 lb (89.359 kg)] 187 lb 4.8 oz (84.959 kg) (01/12 1900) Weight change:  Last BM Date: 06/06/14  Intake/Output from previous day: 01/12 0701 - 01/13 0700 In: 1147.9 [I.V.:1147.9] Out: -  Total I/O In: 240 [P.O.:240] Out: -    Physical Exam: General- pt is awake,alert, oriented to time place and person Resp- No acute REsp distress, CTA B/L NO Rhonchi CVS- S1S2 regular in rate and rhythm GIT- BS+, soft, NT, ND EXT- NO LE Edema, Cyanosis CNS- CN 2-12 grossly intact. Moving all 4 extremities Psych- normal mood and affect    Lab Results: CBC  Recent Labs  06/06/14 1621 06/07/14 0610  WBC 8.8 6.5  HGB 18.6* 15.7  HCT 53.3* 46.7  PLT 151 139*    BMET  Recent Labs  06/06/14 1621 06/07/14 0610  NA 137 136  K 3.3* 3.2*  CL 95* 97  CO2 31 29  GLUCOSE 210* 152*  BUN 25* 23  CREATININE 4.21* 2.60*  CALCIUM 9.7 8.8   Trend Creat 2016 4.21=>2.6 2015  1.3--1.4 2014   1.2--1.3 2010   1.4--2.5 ( AKi)     MICRO No results found for this or any previous visit (from the past 240 hour(s)).    Lab Results  Component Value Date   CALCIUM 8.8 06/07/2014      Impression: 1)Renal  AKI secondary to Prerenal /ATN               AKI sec to Multiple factors                    Hypotension                     Poor PO intake                     ACE                     HCTZ              AKi on CKD              AKI better  CKD stage 3 .               CKD since 2010               CKD secondary to HTN/Multiple AKI/Hx of Cocaine +                Progression of CKD slow, but now has AKI                Proteinura will check.   2)HTN  Medication- On  Calcium Channel Blockers On Vasodilators On Central Acting Sympatholytics  3)Anemia HGb at goal (9--11)   4)CKD Mineral-Bone Disorder PTH not avail . Secondary Hyperparathyroidism w/u pending Phosphorus & Vitamin 25-OH will check.  5)CNS- admitted with dizziness Primary MD following  6)Electrolytes Hypokalemic   Being repleted NOrmonatremic   7)Acid base Co2 at goal     Plan:  Agree with holding ACE Agree with holding hCTZ wil ask for REnal U/S Will ask for CKD- BMD labs Will quantify proteinuria Will ask for urine tox screen     Shirlette Scarber S 06/07/2014, 10:19 AM

## 2014-06-07 NOTE — Progress Notes (Signed)
[facility] PROGRESS NOTE  Tytan Sandate DDU:[id number] DOB: July 05, 1955 DOA: 06/06/2014 PCP: Wardell Honour, MD  Assessment/Plan: 1. Acute renal failure. This is likely caused by the combination of ACE inhibitor and diuretic. Improved with gently IV hydration. Await ultrasound of the kidneys. Monitor renal function closely. Await nephrology recommendations 2. Hypertension. Only fair control. Holding diuretics and ACE and clonidine. Will continue hydralazine and norvasc and metoprolol. Fair control but not all meds administered yet today. Will monitor 3. History of cerebrovascular disease. Stable. 4. Tobacco abuse. He was counseled against smoking. 5. Proteinuria: likely related to above and some concern diabetes as well. Follow nephrology recommendations 6. Hyperglycemia: no hx same. Will obtain A1c. Monitor and start SSI as indicated 7. Hypkalemia: mild. Will replete and recheck.   Code Status: full Family Communication: brother at bedside Disposition Plan: home when ready. Hopefully 24-48 hours   Consultants:  nephrology  Procedures:  none  Antibiotics:  none  HPI/Subjective: Sitting on side of bed reports feeling "alot better"  Objective: Filed Vitals:   06/07/14 0949  BP: 136/91  Pulse: 69  Temp: 98.4 F (36.9 C)  Resp: 19    Intake/Output Summary (Last 24 hours) at 06/07/14 1244 Last data filed at 06/07/14 0917  Gross per 24 hour  Intake 1387.91 ml  Output      0 ml  Net 1387.91 ml   Filed Weights   06/06/14 1316 06/06/14 1900  Weight: 89.359 kg (197 lb) 84.959 kg (187 lb 4.8 oz)    Exam:   General:  Well nourished NAD  Cardiovascular: RRR no MGR no LE edema  Respiratory: normal effort BS somewhat diminished but clear no wheeze  Abdomen: obese soft +BS non-tender to palpation  Musculoskeletal: joints without clubbing or cyanosis   Data Reviewed: Basic Metabolic Panel:  Recent Labs Lab 06/06/14 1621 06/07/14 0610  NA 137 136  K  3.3* 3.2*  CL 95* 97  CO2 31 29  GLUCOSE 210* 152*  BUN 25* 23  CREATININE 4.21* 2.60*  CALCIUM 9.7 8.8   Liver Function Tests:  Recent Labs Lab 06/07/14 0610  AST 42*  ALT 41  ALKPHOS 91  BILITOT 0.8  PROT 7.0  ALBUMIN 3.4*   No results for input(s): LIPASE, AMYLASE in the last 168 hours. No results for input(s): AMMONIA in the last 168 hours. CBC:  Recent Labs Lab 06/06/14 1621 06/07/14 0610  WBC 8.8 6.5  NEUTROABS 5.0  --   HGB 18.6* 15.7  HCT 53.3* 46.7  MCV 92.2 91.9  PLT 151 139*   Cardiac Enzymes: No results for input(s): CKTOTAL, CKMB, CKMBINDEX, TROPONINI in the last 168 hours. BNP (last 3 results) No results for input(s): PROBNP in the last 8760 hours. CBG: No results for input(s): GLUCAP in the last 168 hours.  No results found for this or any previous visit (from the past 240 hour(s)).   Studies: No results found.  Scheduled Meds: . amLODipine  10 mg Oral Daily  . aspirin EC  81 mg Oral Daily  . enoxaparin (LOVENOX) injection  30 mg Subcutaneous Q24H  . hydrALAZINE  25 mg Oral Daily  . metoprolol succinate  50 mg Oral QHS  . omega-3 acid ethyl esters  2 g Oral Daily  . potassium chloride  40 mEq Oral Once  . sodium chloride  1,000 mL Intravenous Once  . topiramate  25 mg Oral BID   Continuous Infusions: . sodium chloride 100 mL/hr at 06/07/14 1105    Principal Problem:  Acute renal failure Active Problems:   HTN (hypertension)   CVA (cerebral vascular accident)   Tobacco user   ARF (acute renal failure)   Hyperglycemia   Proteinuria    Time spent: 40 minutes    [facility] Pager [phone]. If 7PM-7AM, please contact night-coverage at [email or web address], password [facility] 06/07/2014, 12:44 PM  LOS: 1 day

## 2014-06-07 NOTE — Progress Notes (Signed)
UR chart review completed.  

## 2014-06-07 NOTE — Care Management Note (Signed)
    Page 1 of 1   06/07/2014     1:17:37 PM CARE MANAGEMENT NOTE 06/07/2014  Patient:  Philip [name], Philip [name]   Account Number:  [id number]  Date Initiated:  06/07/2014  Documentation initiated by:  Theophilus Kinds  Subjective/Objective Assessment:   Pt admitted from home with ARF. Pt lives alone and has a nephew that stays with him periodically. Pt is independent with ADL's.     Action/Plan:   NO CM needs noted.   Anticipated DC Date:  06/09/2014   Anticipated DC Plan:  [place]  CM consult      Choice offered to / List presented to:             Status of service:  Completed, signed off Medicare Important Message given?   (If response is "NO", the following Medicare IM given date fields will be blank) Date Medicare IM given:   Medicare IM given by:   Date Additional Medicare IM given:   Additional Medicare IM given by:    Discharge Disposition:  HOME/SELF CARE  Per UR Regulation:    If discussed at Long Length of Stay Meetings, dates discussed:    Comments:  06/07/14 [place], RN BSN CM

## 2014-06-08 DIAGNOSIS — E119 Type 2 diabetes mellitus without complications: Secondary | ICD-10-CM

## 2014-06-08 DIAGNOSIS — E118 Type 2 diabetes mellitus with unspecified complications: Secondary | ICD-10-CM

## 2014-06-08 LAB — BASIC METABOLIC PANEL
ANION GAP: 8 (ref 5–15)
BUN: 17 mg/dL (ref 6–23)
CHLORIDE: 101 meq/L (ref 96–112)
CO2: 28 mmol/L (ref 19–32)
Calcium: 8.8 mg/dL (ref 8.4–10.5)
Creatinine, Ser: 1.45 mg/dL — ABNORMAL HIGH (ref 0.50–1.35)
GFR calc Af Amer: 60 mL/min — ABNORMAL LOW (ref >90)
GFR calc non Af Amer: 52 mL/min — ABNORMAL LOW (ref >90)
GLUCOSE: 158 mg/dL — AB (ref 70–99)
POTASSIUM: 3.1 mmol/L — AB (ref 3.5–5.1)
Sodium: 137 mmol/L (ref 135–145)

## 2014-06-08 LAB — PHOSPHORUS: Phosphorus: 2.7 mg/dL (ref 2.3–4.6)

## 2014-06-08 LAB — C4 COMPLEMENT: Complement C4, Body Fluid: 35 mg/dL (ref 10–40)

## 2014-06-08 LAB — HEPATITIS PANEL, ACUTE
HCV Ab: REACTIVE — AB
Hep A IgM: NONREACTIVE
Hep B C IgM: NONREACTIVE
Hepatitis B Surface Ag: NEGATIVE

## 2014-06-08 LAB — C3 COMPLEMENT: C3 Complement: 141 mg/dL (ref 90–180)

## 2014-06-08 MED ORDER — POTASSIUM CHLORIDE CRYS ER 20 MEQ PO TBCR
40.0000 meq | EXTENDED_RELEASE_TABLET | ORAL | Status: AC
  Administered 2014-06-08 (×2): 40 meq via ORAL
  Filled 2014-06-08 (×2): qty 2

## 2014-06-08 MED ORDER — LIVING WELL WITH DIABETES BOOK
Freq: Once | Status: AC
  Administered 2014-06-08: 18:00:00
  Filled 2014-06-08: qty 1

## 2014-06-08 MED ORDER — GLIPIZIDE 5 MG PO TABS: 5.0000 mg | ORAL_TABLET | Freq: Every day | ORAL | Status: DC

## 2014-06-08 NOTE — Progress Notes (Addendum)
Nutrition Note  RD received consult for DM diet education. Per chart review, pt with new onset diabetes.   Lab Results  Component Value Date   HGBA1C 8.4* 06/07/2014   Attempted education x 2, however, pt out of room at times of both visits. Left AND Nutrition Care Manual's "Carbohydrate Counting For People With Diabetes" handout at bedside. Noted DM coordinator has seen pt. Recommend outpatient diabetes education for further reinforcement of principles.   Body mass index is 29.33 kg/(m^2). meets criteria for overweight.   Pt is currently on a renal diet with 1200 ml fluid restriction, consuming 100% of meals.   Philip [name], RD, LDN, CDE Pager: [phone]

## 2014-06-08 NOTE — Progress Notes (Addendum)
Inpatient Diabetes Program Recommendations  AACE/ADA: New Consensus Statement on Inpatient Glycemic Control (2013)  Target Ranges:  Prepandial:   less than 140 mg/dL      Peak postprandial:   less than 180 mg/dL (1-2 hours)      Critically ill patients:  140 - 180 mg/dL   Results for TYKE, OUTMAN (MRN [id number]) as of 06/08/2014 06:56  Ref. Range 06/07/2014 06:05  Hgb A1c MFr Bld Latest Range: <5.7 % 8.4 (H)   Results for PRESS, CASALE (MRN [id number]) as of 06/08/2014 06:56  Ref. Range 06/06/2014 16:21 06/07/2014 06:10  Glucose Latest Range: 70-99 mg/dL 210 (H) 152 (H)   Diabetes history: No Outpatient Diabetes medications: None Current orders for Inpatient glycemic control: None  Inpatient Diabetes Program Recommendations Correction (SSI): While inpatient please consider ordering CBGs with Novolog correction scale ACHS. HgbA1C: A1C 8.4% on 06/07/14 which meets criteria for diabetes diagnosis per ADA criteria.  If patient is going to be given new diagnosis of diabetes, please inform patient and nursing staff and consult Inpatient Diabetes Coordinator. Diet: Please consider adding Carbohydrate Modified to current diet.  06/08/14 1200pm, Consult Note New Dx DM: Spoke with patient about diabetes. Patient's brother was diagnosed with DM two years ago. The patient also cared for his mother who had DM and had checked her blood sugars. I Inquired about the patient's knowledge of an A1C and patient reports that he does not know what an A1C is. Discussed A1C results (8.4% on 06/07/14) and explained what an A1C is, basic pathophysiology of DM Type 2, basic home care, importance of checking CBGs and maintaining good CBG control to prevent long-term and short-term complications. Discussed impact of nutrition, exercise, and medication (Glipizide) on diabetes control. Discussed the importance of smoking cessation in relation to his DM diagnosis. Discussed with the patient the importance of inspecting his feet and  following up with a PCP for his BP and cholesterol. Patient states that he eats a lot of bread and carbohydrates but drinks mostly water. Pt occasionally will drink juice. Discussed carbohydrates, carbohydrate goals per day and meal, along with portion sizes especially related to his carbohydrate intake.Reviewed signs and symptoms of hyperglycemia and hypoglycemia along with treatment for both. RNs to provide ongoing basic DM education at bedside with this patient and engage patient to actively check blood glucose. Have ordered educational booklet and DM videos. Patient verbalized understanding of information discussed and he states that he has no further questions at this time related to diabetes. Tama Headings, RN, MSN, PCCN Diabetes Coordinator   Thanks, Barnie Alderman, RN, MSN, CCRN Diabetes Coordinator Inpatient Diabetes Program [phone] (Team Pager) [phone] ([facility] office) [phone] [facility] office)

## 2014-06-08 NOTE — Discharge Summary (Signed)
Physician Discharge Summary  Philip [name] HQP:[id number] DOB: 30-Sep-1955 DOA: 06/06/2014  PCP: Wardell Honour, MD  Admit date: 06/06/2014 Discharge date: 06/08/2014  Time spent: 40 minutes  Recommendations for Outpatient Follow-up:  1. Patient will follow up with nephrology in 2-3 weeks. He has undergone a 24-hour urine study for protein as well as other renal workup which will be followed up in the outpatient setting by nephrology 2. Follow-up with primary care physician in 1-2 weeks for further management of diabetes, new diagnosis 3. Follow-up with primary care physician regarding HCV RNA level, since patient has HCV antibody positive. May need referral to gastroenterology for further treatment if elevated HCV RNA level  Discharge Diagnoses:  Principal Problem:   Acute renal failure Active Problems:   HTN (hypertension)   Tobacco user   ARF (acute renal failure)   Hyperglycemia   Proteinuria   Acute renal failure syndrome   Diabetes mellitus  HCV antibody positive  Discharge Condition: Improved  Diet recommendation: Low-salt, low carb  Filed Weights   06/06/14 1316 06/06/14 1900  Weight: 89.359 kg (197 lb) 84.959 kg (187 lb 4.8 oz)    History of present illness:  This patient was presented to the hospital with generalized weakness, dizziness, worse on standing. His blood pressure medications were recently adjusted. He was admitted with a creatinine of 4.21 and evidence of dehydration. He was admitted to the hospital for further treatment.  Hospital Course:  Patient was started on intravenous hydration. His creatinine improved back down to 1.4. He was seen by nephrology who ordered a renal ultrasound which was unremarkable. He was found to have some proteinuria and therefore 24-hour urine for protein has been completed, but will need to be followed up in the outpatient setting by nephrology. Further workup including HCV antibody was found to be positive. HCV RNA has been sent  and will need to be followed up in the outpatient setting. Patient was noted to be hyperglycemic. Hemoglobin A1c was found to be elevated at 8.1. He's been started on glipizide for further management of diabetes. This will need to be followed up by his primary care physician. His blood pressures have been stable during his hospital stay. Would avoid nephrotoxic agents at this time until his renal function is further stabilized. Patient is otherwise stable for discharge home.  Procedures:    Consultations:  Nephrology  Discharge Exam: Filed Vitals:   06/08/14 1500  BP: 127/87  Pulse: 69  Temp: 98.6 F (37 C)  Resp: 18    General: NAD Cardiovascular: S1, S2, regular rate and rhythm Respiratory: Clear to auscultation bilaterally  Discharge Instructions   Discharge Instructions    Call MD for:  extreme fatigue    Complete by:  As directed      Call MD for:  persistant dizziness or light-headedness    Complete by:  As directed      Diet - low sodium heart healthy    Complete by:  As directed      Diet Carb Modified    Complete by:  As directed      Increase activity slowly    Complete by:  As directed           Discharge Medication List as of 06/08/2014  5:41 PM    START taking these medications   Details  glipiZIDE (GLUCOTROL) 5 MG tablet Take 1 tablet (5 mg total) by mouth daily before breakfast., Starting 06/08/2014, Until Discontinued, Print      CONTINUE  these medications which have NOT CHANGED   Details  amLODipine (NORVASC) 10 MG tablet Take 1 tablet (10 mg total) by mouth daily., Starting 05/18/2014, Until Discontinued, Normal    aspirin EC 81 MG tablet Take 81 mg by mouth daily., Until Discontinued, Historical Med    fish oil-omega-3 fatty acids 1000 MG capsule Take 2 g by mouth daily., Until Discontinued, Historical Med    hydrALAZINE (APRESOLINE) 25 MG tablet Take 1 tablet (25 mg total) by mouth daily., Starting 05/18/2014, Until Discontinued, Normal     metoprolol succinate (TOPROL XL) 50 MG 24 hr tablet Take 1 tablet (50 mg total) by mouth daily. Take with or immediately following a meal., Starting 05/24/2014, Until Discontinued, Normal    topiramate (TOPAMAX) 25 MG tablet Take 1 tablet (25 mg total) by mouth 2 (two) times daily., Starting 05/18/2014, Until Discontinued, Normal    Cholecalciferol (VITAMIN D3) 50000 UNITS CAPS Take 50,000 Units by mouth once a week., Starting 12/15/2012, Until Discontinued, Normal    ondansetron (ZOFRAN ODT) 4 MG disintegrating tablet Take 1 tablet (4 mg total) by mouth every 8 (eight) hours as needed for nausea or vomiting., Starting 03/13/2014, Until Discontinued, Normal      STOP taking these medications     cloNIDine (CATAPRES) 0.2 MG tablet      furosemide (LASIX) 40 MG tablet      lisinopril-hydrochlorothiazide (PRINZIDE,ZESTORETIC) 10-12.5 MG per tablet      potassium chloride (K-DUR) 10 MEQ tablet      triamterene-hydrochlorothiazide (MAXZIDE-25) 37.5-25 MG per tablet        No Known Allergies Follow-up Information    Follow up with Wardell Honour, MD. Call in 3 days.   Specialty:  Family Medicine   Why:  follow up on hepatitis C test   Contact information:   [place] [address] [phone]       Follow up with [facility], MD.   Specialty:  Nephrology   Why:  call for appointment in 2-3 weeks   Contact information:   [address] [place] [address] [phone]        The results of significant diagnostics from this hospitalization (including imaging, microbiology, ancillary and laboratory) are listed below for reference.    Significant Diagnostic Studies: US Renal  06/07/2014   CLINICAL DATA:  Acute renal failure  EXAM: RENAL/URINARY TRACT ULTRASOUND COMPLETE  COMPARISON:  CT scan 01/13/2014  FINDINGS: Right Kidney:  Length: 10.4 cm. Echogenicity within normal limits. No mass or hydronephrosis visualized.  Left Kidney:  Length: 9.8  cm. Echogenicity within normal limits. No mass or hydronephrosis visualized.  Bladder:  Appears normal for degree of bladder distention. Bilateral ureteral jets are visualized.  IMPRESSION: Unremarkable renal ultrasound.  No hydronephrosis or renal calculi.   Electronically Signed   By: Lahoma Crocker M.D.   On: 06/07/2014 15:33    Microbiology: No results found for this or any previous visit (from the past 240 hour(s)).   Labs: Basic Metabolic Panel:  Recent Labs Lab 06/06/14 1621 06/07/14 0610 06/08/14 0726  NA 137 136 137  K 3.3* 3.2* 3.1*  CL 95* 97 101  CO2 31 29 28   GLUCOSE 210* 152* 158*  BUN 25* 23 17  CREATININE 4.21* 2.60* 1.45*  CALCIUM 9.7 8.8 8.8  PHOS  --   --  2.7   Liver Function Tests:  Recent Labs Lab 06/07/14 0610  AST 42*  ALT 41  ALKPHOS 91  BILITOT 0.8  PROT 7.0  ALBUMIN 3.4*   No results for input(s): LIPASE, AMYLASE in the last 168 hours. No results for input(s): AMMONIA in the last 168 hours. CBC:  Recent Labs Lab 06/06/14 1621 06/07/14 0610  WBC 8.8 6.5  NEUTROABS 5.0  --   HGB 18.6* 15.7  HCT 53.3* 46.7  MCV 92.2 91.9  PLT 151 139*   Cardiac Enzymes: No results for input(s): CKTOTAL, CKMB, CKMBINDEX, TROPONINI in the last 168 hours. BNP: BNP (last 3 results) No results for input(s): PROBNP in the last 8760 hours. CBG: No results for input(s): GLUCAP in the last 168 hours.     Signed:  Narcissa Melder  [facility] 06/08/2014, 7:58 PM

## 2014-06-08 NOTE — Progress Notes (Signed)
Subjective: Interval History: has no complaint of nausea or vomiting. Patient presently denies any difficulty breathing..  Objective: Vital signs in last 24 hours: Temp:  [97.9 F (36.6 C)-98.7 F (37.1 C)] 97.9 F (36.6 C) (01/14 0545) Pulse Rate:  [61-74] 74 (01/14 0545) Resp:  [18-19] 18 (01/14 0545) BP: (124-141)/(73-103) 132/73 mmHg (01/14 0545) SpO2:  [100 %] 100 % (01/14 0545) Weight change:   Intake/Output from previous day: 01/13 0701 - 01/14 0700 In: 720 [P.O.:720] Out: -  Intake/Output this shift:    General appearance: alert, cooperative and no distress Resp: clear to auscultation bilaterally Cardio: regular rate and rhythm, S1, S2 normal, no murmur, click, rub or gallop GI: soft, non-tender; bowel sounds normal; no masses,  no organomegaly Extremities: extremities normal, atraumatic, no cyanosis or edema  Lab Results:  Recent Labs  06/06/14 1621 06/07/14 0610  WBC 8.8 6.5  HGB 18.6* 15.7  HCT 53.3* 46.7  PLT 151 139*   BMET:  Recent Labs  06/07/14 0610 06/08/14 0726  NA 136 137  K 3.2* 3.1*  CL 97 101  CO2 29 28  GLUCOSE 152* 158*  BUN 23 17  CREATININE 2.60* 1.45*  CALCIUM 8.8 8.8   No results for input(s): PTH in the last 72 hours. Iron Studies: No results for input(s): IRON, TIBC, TRANSFERRIN, FERRITIN in the last 72 hours.  Studies/Results: US Renal  06/07/2014   CLINICAL DATA:  Acute renal failure  EXAM: RENAL/URINARY TRACT ULTRASOUND COMPLETE  COMPARISON:  CT scan 01/13/2014  FINDINGS: Right Kidney:  Length: 10.4 cm. Echogenicity within normal limits. No mass or hydronephrosis visualized.  Left Kidney:  Length: 9.8 cm. Echogenicity within normal limits. No mass or hydronephrosis visualized.  Bladder:  Appears normal for degree of bladder distention. Bilateral ureteral jets are visualized.  IMPRESSION: Unremarkable renal ultrasound.  No hydronephrosis or renal calculi.   Electronically Signed   By: Lahoma Crocker M.D.   On: 06/07/2014 15:33     I have reviewed the patient's current medications.  Assessment/Plan: Problem #1 acute kidney injury: The etiology was  thoght to be secondary to ATN/prerenal/HCTZ /ACE. Presently his renal function seems to be improving. His BUN and creatinine has come down to his baseline. Problem #2 chronic renal failure: Stage III. Etiology could be secondary to hypertension/recurrent acute kidney injury/possibly also from cocaine use. Problem #3 hypokalemia: Most likely from hydrochlorothiazide: Presently his potassium is 3.1 still low. Problem #4 hypertension: His blood pressure is reasonably controlled Problem #5 history of CVA Problem #6 history of diabetes Problem #7: Proteinuria: This could be from his diabetes/hypertension/questionable from hepatitis C infection is confirmed by Hep c viralRNA. Workup for his proteinuria is pending. Problem #8 positive hepatitis C antibody. Plan: Will DC IV fluid Agree with potassium supplement We'll check his basic metabolic panel in the morning.    LOS: 2 days   Zed Wanninger S 06/08/2014,10:46 AM

## 2014-06-09 ENCOUNTER — Other Ambulatory Visit: Payer: Self-pay | Admitting: *Deleted

## 2014-06-09 DIAGNOSIS — E559 Vitamin D deficiency, unspecified: Secondary | ICD-10-CM

## 2014-06-09 LAB — PROTEIN ELECTROPHORESIS, SERUM
Albumin ELP: 49 % — ABNORMAL LOW (ref 55.8–66.1)
Alpha-1-Globulin: 5.5 % — ABNORMAL HIGH (ref 2.9–4.9)
Alpha-2-Globulin: 12.2 % — ABNORMAL HIGH (ref 7.1–11.8)
Beta 2: 6.9 % — ABNORMAL HIGH (ref 3.2–6.5)
Beta Globulin: 7.3 % — ABNORMAL HIGH (ref 4.7–7.2)
Gamma Globulin: 19.1 % — ABNORMAL HIGH (ref 11.1–18.8)
M-Spike, %: NOT DETECTED g/dL
Total Protein ELP: 7.5 g/dL (ref 6.0–8.3)

## 2014-06-09 LAB — PTH, INTACT AND CALCIUM
Calcium, Total (PTH): 8.6 mg/dL — ABNORMAL LOW (ref 8.7–10.2)
PTH: 30 pg/mL (ref 15–65)

## 2014-06-09 LAB — IMMUNOFIXATION ELECTROPHORESIS
IgA: 527 mg/dL — ABNORMAL HIGH (ref 68–379)
IgG (Immunoglobin G), Serum: 1510 mg/dL (ref 650–1600)
IgM, Serum: 64 mg/dL (ref 41–251)
Total Protein ELP: 7.5 g/dL (ref 6.0–8.3)

## 2014-06-09 LAB — VITAMIN D 25 HYDROXY (VIT D DEFICIENCY, FRACTURES): Vit D, 25-Hydroxy: 7.6 ng/mL — ABNORMAL LOW (ref 30.0–100.0)

## 2014-06-09 LAB — HCV RNA QUANT
HCV Quantitative Log: 6.71 {Log} — ABNORMAL HIGH (ref <1.18)
HCV Quantitative: 5146488 IU/mL — ABNORMAL HIGH (ref <15)

## 2014-06-09 LAB — COMPLEMENT, TOTAL: Compl, Total (CH50): >60 U/mL — ABNORMAL HIGH (ref 31–60)

## 2014-06-09 MED ORDER — GLIPIZIDE 5 MG PO TABS: 5.0000 mg | ORAL_TABLET | Freq: Every day | ORAL | Status: DC

## 2014-06-09 MED ORDER — VITAMIN D3 1.25 MG (50000 UT) PO CAPS: ORAL_CAPSULE | ORAL | Status: DC

## 2014-06-09 MED ORDER — METOPROLOL SUCCINATE ER 50 MG PO TB24: 50.0000 mg | ORAL_TABLET | Freq: Every day | ORAL | Status: DC

## 2014-06-12 LAB — HCV RNA QUANT
HCV QUANT LOG: 6.57 {Log} — AB (ref <1.18)
HCV Quantitative: 3672847 IU/mL — ABNORMAL HIGH (ref <15)

## 2014-06-12 LAB — UIFE/LIGHT CHAINS/TP QN, 24-HR UR
Albumin, U: DETECTED
Alpha 1, Urine: DETECTED — AB
Alpha 2, Urine: DETECTED — AB
Beta, Urine: DETECTED — AB
Gamma Globulin, Urine: DETECTED — AB
Time: 24 hours
Total Protein, Urine-Ur/day: 299 mg/d — ABNORMAL HIGH (ref <150)
Total Protein, Urine: 16 mg/dL (ref 5–25)
Volume, Urine: 1870 mL

## 2014-06-12 LAB — IMMUNOFIXATION, URINE

## 2014-06-26 HISTORY — PX: COLONOSCOPY: SHX174

## 2014-06-27 ENCOUNTER — Ambulatory Visit (INDEPENDENT_AMBULATORY_CARE_PROVIDER_SITE_OTHER): Payer: Medicaid Other | Admitting: Family Medicine

## 2014-06-27 ENCOUNTER — Telehealth: Payer: Self-pay | Admitting: *Deleted

## 2014-06-27 ENCOUNTER — Encounter: Payer: Self-pay | Admitting: Family Medicine

## 2014-06-27 VITALS — BP 159/110 | HR 96 | Temp 98.7°F | Ht 67.0 in | Wt 198.0 lb

## 2014-06-27 DIAGNOSIS — N189 Chronic kidney disease, unspecified: Secondary | ICD-10-CM

## 2014-06-27 DIAGNOSIS — I10 Essential (primary) hypertension: Secondary | ICD-10-CM

## 2014-06-27 MED ORDER — HYDRALAZINE HCL 25 MG PO TABS: 25.0000 mg | ORAL_TABLET | Freq: Every day | ORAL | Status: DC

## 2014-06-27 MED ORDER — TRAMADOL HCL 50 MG PO TABS: 50.0000 mg | ORAL_TABLET | Freq: Three times a day (TID) | ORAL | Status: DC | PRN

## 2014-06-27 MED ORDER — METOPROLOL SUCCINATE ER 50 MG PO TB24: 100.0000 mg | ORAL_TABLET | Freq: Every day | ORAL | Status: DC

## 2014-06-27 NOTE — Telephone Encounter (Signed)
Hydralazine was sent in for 1 daily instead of TID.  Notified pharmacy to change directions for future refills.  Metoprolol was order 2 daily #30 instead of #60.  Notified pharmacy to change directions for future refills.

## 2014-06-27 NOTE — Progress Notes (Signed)
Subjective:    Patient ID: Philip [name], male    DOB: 1956/03/16, 59 y.o.   MRN: [id number]  HPI  59 year old male comes in today accompanied by his sister to discuss his hypertension and hospital discharge 2 weeks ago. He also complains of a dry cough for 2 weeks. Unsure what really happened hospital but apparently he had a syncopal episode and on arrival his renal function had declined significantly. Subsequently renal function became closer to baseline but at the time of discharge blood pressure medicines included amlodipine hydralazine and metoprolol.  He was also diagnosed with diabetes and started on glipizide. Apparently not much education ensued and we spent some time today talking about carbohydrates and sweets and how they affected the diabetes.  Patient Active Problem List   Diagnosis Date Noted  . Diabetes mellitus 06/08/2014  . Hyperglycemia 06/07/2014  . Proteinuria 06/07/2014  . Acute renal failure syndrome   . ARF (acute renal failure) 06/06/2014  . Acute renal failure 06/06/2014  . Tobacco user   . Erectile dysfunction   . Chronic cholecystitis with calculus 02/04/2013  . Umbilical hernia 96/22/2979  . Gallstones 01/27/2013  . Hyperlipidemia 12/13/2012  . OSA (obstructive sleep apnea) 11/03/2012  . HTN (hypertension) 09/16/2012  . CVA (cerebral vascular accident) 09/16/2012  . History of seizure disorder, since CVA 09/16/2012  . Unspecified asthma(493.90) 09/16/2012   Outpatient Encounter Prescriptions as of 06/27/2014  Medication Sig  . amLODipine (NORVASC) 10 MG tablet Take 1 tablet (10 mg total) by mouth daily.  Marland Kitchen aspirin EC 81 MG tablet Take 81 mg by mouth daily.  . Cholecalciferol (VITAMIN D3) 50000 UNITS CAPS Every other week for 2 months  . fish oil-omega-3 fatty acids 1000 MG capsule Take 2 g by mouth daily.  Marland Kitchen glipiZIDE (GLUCOTROL) 5 MG tablet Take 1 tablet (5 mg total) by mouth daily before breakfast.  . hydrALAZINE (APRESOLINE) 25 MG tablet Take 1 tablet  (25 mg total) by mouth daily.  . metoprolol succinate (TOPROL XL) 50 MG 24 hr tablet Take 1 tablet (50 mg total) by mouth daily. Take with or immediately following a meal.  . topiramate (TOPAMAX) 25 MG tablet Take 1 tablet (25 mg total) by mouth 2 (two) times daily.  . [DISCONTINUED] ondansetron (ZOFRAN ODT) 4 MG disintegrating tablet Take 1 tablet (4 mg total) by mouth every 8 (eight) hours as needed for nausea or vomiting. (Patient not taking: Reported on 05/18/2014)      Review of Systems  Constitutional: Negative.   HENT: Negative.   Respiratory: Positive for cough.   Cardiovascular: Negative.   Gastrointestinal: Negative.   Genitourinary: Negative.   Neurological: Negative.        Objective:   Physical Exam  Constitutional: He is oriented to person, place, and time.  HENT:  Head: Normocephalic.  Cardiovascular: Normal rate and regular rhythm.   Pulmonary/Chest: Effort normal and breath sounds normal.  Neurological: He is alert and oriented to person, place, and time.    BP 159/110 mmHg  Pulse 96  Temp(Src) 98.7 F (37.1 C) (Oral)  Ht 5\' 7"  (1.702 m)  Wt 198 lb (89.812 kg)  BMI 31.00 kg/m2  SpO2 100%       Assessment & Plan:  1. Essential hypertension BP not well controlled: increase metoprolol to 100 mg/day, hydralazine to 25, tid and recheck later this week.  If still not controlled, add back clonidine.  Would probably avoid ACE/ARB until seen renal Nephrology referral  Wardell Honour  MD

## 2014-06-28 MED ORDER — METOPROLOL SUCCINATE ER 50 MG PO TB24: 100.0000 mg | ORAL_TABLET | Freq: Every day | ORAL | Status: DC

## 2014-06-28 MED ORDER — HYDRALAZINE HCL 25 MG PO TABS: 25.0000 mg | ORAL_TABLET | Freq: Three times a day (TID) | ORAL | Status: DC

## 2014-06-28 NOTE — Addendum Note (Signed)
Addended by: Ilean China on: 06/28/2014 08:52 AM   Modules accepted: Orders

## 2014-07-07 ENCOUNTER — Ambulatory Visit (AMBULATORY_SURGERY_CENTER): Payer: Self-pay | Admitting: *Deleted

## 2014-07-07 VITALS — Ht 67.0 in | Wt 199.0 lb

## 2014-07-07 DIAGNOSIS — Z1211 Encounter for screening for malignant neoplasm of colon: Secondary | ICD-10-CM

## 2014-07-07 MED ORDER — MOVIPREP 100 G PO SOLR: 1.0000 | Freq: Once | ORAL | Status: DC

## 2014-07-07 NOTE — Progress Notes (Signed)
No egg or soy allergy No diet pills No home 02 use No issues with past sedation Pt declined emmi Sister Hassan Rowan and sister Jackelyn Poling both in [place] with pt. 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 Encouraged them to call with questions and they state they will help brother with instructions

## 2014-07-20 ENCOUNTER — Telehealth: Payer: Self-pay | Admitting: Internal Medicine

## 2014-07-20 NOTE — Telephone Encounter (Signed)
Receptionist received a call from patient regarding his prep.  He had mixed it up early, and he wanted to know if it was okay.  She asked [place], and we stated that it was okay and to keep it cold.  He agreed.

## 2014-07-21 ENCOUNTER — Encounter: Payer: Self-pay | Admitting: Internal Medicine

## 2014-07-21 ENCOUNTER — Ambulatory Visit (AMBULATORY_SURGERY_CENTER): Payer: Medicaid Other | Admitting: Internal Medicine

## 2014-07-21 VITALS — BP 133/99 | HR 81 | Temp 96.4°F | Resp 28 | Ht 67.0 in | Wt 198.0 lb

## 2014-07-21 DIAGNOSIS — D128 Benign neoplasm of rectum: Secondary | ICD-10-CM

## 2014-07-21 DIAGNOSIS — K635 Polyp of colon: Secondary | ICD-10-CM

## 2014-07-21 DIAGNOSIS — D124 Benign neoplasm of descending colon: Secondary | ICD-10-CM

## 2014-07-21 DIAGNOSIS — Z1211 Encounter for screening for malignant neoplasm of colon: Secondary | ICD-10-CM

## 2014-07-21 DIAGNOSIS — K621 Rectal polyp: Secondary | ICD-10-CM

## 2014-07-21 DIAGNOSIS — O99814 Abnormal glucose complicating childbirth: Secondary | ICD-10-CM

## 2014-07-21 MED ORDER — SODIUM CHLORIDE 0.9 % IV SOLN: 500.0000 mL | INTRAVENOUS | Status: DC

## 2014-07-21 NOTE — Patient Instructions (Signed)
Colon polyps removed today, handouts given on polyps and high fiber diet. Try to follow high fiber diet. Call us with any questions or concerns. Thank you!  YOU HAD AN ENDOSCOPIC PROCEDURE TODAY AT [facility]: Refer to the procedure report that was given to you for any specific questions about what was found during the examination.  If the procedure report does not answer your questions, please call your gastroenterologist to clarify.  If you requested that your care partner not be given the details of your procedure findings, then the procedure report has been included in a sealed envelope for you to review at your convenience later.  YOU SHOULD EXPECT: Some feelings of bloating in the abdomen. Passage of more gas than usual.  Walking can help get rid of the air that was put into your GI tract during the procedure and reduce the bloating. If you had a lower endoscopy (such as a colonoscopy or flexible sigmoidoscopy) you may notice spotting of blood in your stool or on the toilet paper. If you underwent a bowel prep for your procedure, then you may not have a normal bowel movement for a few days.  DIET: Your first meal following the procedure should be a light meal and then it is ok to progress to your normal diet.  A half-sandwich or bowl of soup is an example of a good first meal.  Heavy or fried foods are harder to digest and may make you feel nauseous or bloated.  Likewise meals heavy in dairy and vegetables can cause extra gas to form and this can also increase the bloating.  Drink plenty of fluids but you should avoid alcoholic beverages for 24 hours.  ACTIVITY: Your care partner should take you home directly after the procedure.  You should plan to take it easy, moving slowly for the rest of the day.  You can resume normal activity the day after the procedure however you should NOT DRIVE or use heavy machinery for 24 hours (because of the sedation medicines used during the test).     SYMPTOMS TO REPORT IMMEDIATELY: A gastroenterologist can be reached at any hour.  During normal business hours, 8:30 AM to 5:00 PM Monday through Friday, call [phone].  After hours and on weekends, please call the GI answering service at [phone] who will take a message and have the physician on call contact you.   Following lower endoscopy (colonoscopy or flexible sigmoidoscopy):  Excessive amounts of blood in the stool  Significant tenderness or worsening of abdominal pains  Swelling of the abdomen that is new, acute  Fever of 100F or higher  Following upper endoscopy (EGD)  Vomiting of blood or coffee ground material  New chest pain or pain under the shoulder blades  Painful or persistently difficult swallowing  New shortness of breath  Fever of 100F or higher  Black, tarry-looking stools  FOLLOW UP: If any biopsies were taken you will be contacted by phone or by letter within the next 1-3 weeks.  Call your gastroenterologist if you have not heard about the biopsies in 3 weeks.  Our staff will call the home number listed on your records the next business day following your procedure to check on you and address any questions or concerns that you may have at that time regarding the information given to you following your procedure. This is a courtesy call and so if there is no answer at the home number and we have not heard  from you through the emergency physician on call, we will assume that you have returned to your regular daily activities without incident.  SIGNATURES/CONFIDENTIALITY: You and/or your care partner have signed paperwork which will be entered into your electronic medical record.  These signatures attest to the fact that that the information above on your After Visit Summary has been reviewed and is understood.  Full responsibility of the confidentiality of this discharge information lies with you and/or your care-partner.

## 2014-07-21 NOTE — Progress Notes (Signed)
A/ox3 pleased with MAC, report to Robbin RN 

## 2014-07-21 NOTE — Op Note (Signed)
[place]  [address] [place], [id number]   COLONOSCOPY PROCEDURE REPORT  PATIENT: Catlin, Aycock  MR#: [id number] BIRTHDATE: 01/22/1956 , 63  yrs. old GENDER: male ENDOSCOPIST: Lafayette Dragon, MD REFERRED TD:[id number] Sabra Heck, M.D. PROCEDURE DATE:  07/21/2014 PROCEDURE:   Colonoscopy with cold biopsy polypectomy and Colonoscopy with snare polypectomy First Screening Colonoscopy - Avg.  risk and is 50 yrs.  old or older Yes.  Prior Negative Screening - Now for repeat screening. N/A  History of Adenoma - Now for follow-up colonoscopy & has been > or = to 3 yrs.  N/A  Polyps Removed Today? Yes. ASA CLASS:   Class II INDICATIONS:average risk patient for colon cancer. MEDICATIONS: Monitored anesthesia care and Propofol 240 mg IV  DESCRIPTION OF PROCEDURE:   After the risks benefits and alternatives of the procedure were thoroughly explained, informed consent was obtained.  The digital rectal exam revealed no abnormalities of the rectum.   The LB CF-H180AL Loaner [id number] endoscope was introduced through the anus and advanced to the cecum, which was identified by both the appendix and ileocecal valve. No adverse events experienced.   The quality of the prep was good, using MoviPrep  The instrument was then slowly withdrawn as the colon was fully examined.      COLON FINDINGS: Three sessile polyps measuring 6 mm in size were found in the rectum and descending colon.  A polypectomy was performed with cold forceps.  The resection was complete, the polyp tissue was completely retrieved and sent to histology.  A polypectomy was performed with a cold snare.  The resection was complete, the polyp tissue was completely retrieved and sent to histology.  Retroflexed views revealed no abnormalities. The time to cecum=2 minutes 10 seconds.  Withdrawal time=11 minutes 08 seconds.  The scope was withdrawn and the procedure completed. COMPLICATIONS: There were no immediate  complications.  ENDOSCOPIC IMPRESSION: Three sessile polyps were found in the rectum x2 and descending colonx1; polypectomy was performed with cold forceps ;in descending colon, polypectomy was performed with a cold snare in the restum  RECOMMENDATIONS: 1.  Await pathology results 2.  High fiber diet  Recall colonoscopy pending path report  eSigned:  Lafayette Dragon, MD 07/21/2014 9:18 AM   cc:   PATIENT NAME:  Jeffery, Bachmeier MR#: [id number]

## 2014-07-21 NOTE — Progress Notes (Signed)
Called to room to assist during endoscopic procedure.  Patient ID and intended procedure confirmed with present staff. Received instructions for my participation in the procedure from the performing physician.  

## 2014-07-24 ENCOUNTER — Telehealth: Payer: Self-pay | Admitting: *Deleted

## 2014-07-24 NOTE — Telephone Encounter (Signed)
  Follow up Call-  Call back number 07/21/2014  Post procedure Call Back phone  # [phone]  Permission to leave phone message Yes     Patient questions:  Do you have a fever, pain , or abdominal swelling? No. Pain Score  0 *  Have you tolerated food without any problems? Yes.    Have you been able to return to your normal activities? Yes.    Do you have any questions about your discharge instructions: Diet   No. Medications  No. Follow up visit  No.  Do you have questions or concerns about your Care? No.  Actions: * If pain score is 4 or above: No action needed, pain <4.

## 2014-07-26 ENCOUNTER — Encounter: Payer: Self-pay | Admitting: Internal Medicine

## 2014-08-10 ENCOUNTER — Telehealth: Payer: Self-pay | Admitting: Family Medicine

## 2014-08-14 ENCOUNTER — Other Ambulatory Visit: Payer: Self-pay | Admitting: *Deleted

## 2014-08-14 MED ORDER — METOPROLOL SUCCINATE ER 50 MG PO TB24
100.0000 mg | ORAL_TABLET | Freq: Every day | ORAL | Status: DC
Start: 2014-08-14 — End: 2015-06-18

## 2014-08-23 ENCOUNTER — Telehealth: Payer: Self-pay | Admitting: Family Medicine

## 2014-08-23 NOTE — Telephone Encounter (Signed)
Eula Fried, then called [facility]. [facility] does have his Hydralazine at [facility].

## 2014-08-30 ENCOUNTER — Encounter: Payer: Self-pay | Admitting: Family Medicine

## 2014-08-30 ENCOUNTER — Ambulatory Visit (INDEPENDENT_AMBULATORY_CARE_PROVIDER_SITE_OTHER): Payer: Medicaid Other | Admitting: Family Medicine

## 2014-08-30 VITALS — BP 148/98 | HR 69 | Temp 98.6°F | Ht 67.0 in | Wt 199.0 lb

## 2014-08-30 DIAGNOSIS — R05 Cough: Secondary | ICD-10-CM | POA: Diagnosis not present

## 2014-08-30 DIAGNOSIS — I10 Essential (primary) hypertension: Secondary | ICD-10-CM | POA: Diagnosis not present

## 2014-08-30 DIAGNOSIS — R059 Cough, unspecified: Secondary | ICD-10-CM

## 2014-08-30 MED ORDER — CLONIDINE HCL 0.1 MG PO TABS: ORAL_TABLET | ORAL | Status: DC

## 2014-08-30 MED ORDER — TRAMADOL HCL 50 MG PO TABS: 50.0000 mg | ORAL_TABLET | Freq: Three times a day (TID) | ORAL | Status: DC | PRN

## 2014-08-30 MED ORDER — CLONIDINE HCL 0.1 MG PO TABS
0.1000 mg | ORAL_TABLET | Freq: Once | ORAL | Status: AC
  Administered 2014-08-30: 0.1 mg via ORAL

## 2014-08-30 NOTE — Progress Notes (Signed)
   Subjective:    Patient ID: Philip [name], male    DOB: 09/06/1955, 59 y.o.   MRN: [id number]  HPI  59-year-old gentleman here for cough. He says that when he goes outside and there is dust and pollen there that triggers the cough. He thinks the coughing then raises his blood pressure and he has occipital headache when that occurs. Cough is usually nonproductive and dry. He does have a history of asthma as a child. He denies GERD or wheezing now Patient Active Problem List   Diagnosis Date Noted  . Diabetes mellitus 06/08/2014  . Hyperglycemia 06/07/2014  . Proteinuria 06/07/2014  . Acute renal failure syndrome   . ARF (acute renal failure) 06/06/2014  . Acute renal failure 06/06/2014  . Tobacco user   . Erectile dysfunction   . Chronic cholecystitis with calculus 02/04/2013  . Umbilical hernia 57/97/2820  . Gallstones 01/27/2013  . Hyperlipidemia 12/13/2012  . OSA (obstructive sleep apnea) 11/03/2012  . HTN (hypertension) 09/16/2012  . CVA (cerebral vascular accident) 09/16/2012  . History of seizure disorder, since CVA 09/16/2012  . Unspecified asthma(493.90) 09/16/2012   Outpatient Encounter Prescriptions as of 08/30/2014  Medication Sig  . amLODipine (NORVASC) 10 MG tablet Take 1 tablet (10 mg total) by mouth daily.  Marland Kitchen aspirin EC 81 MG tablet Take 81 mg by mouth daily.  . Cholecalciferol (VITAMIN D3) 50000 UNITS CAPS Every other week for 2 months  . fish oil-omega-3 fatty acids 1000 MG capsule Take 2 g by mouth daily.  Marland Kitchen glipiZIDE (GLUCOTROL) 5 MG tablet Take 1 tablet (5 mg total) by mouth daily before breakfast.  . hydrALAZINE (APRESOLINE) 25 MG tablet Take 1 tablet (25 mg total) by mouth 3 (three) times daily.  . metoprolol succinate (TOPROL XL) 50 MG 24 hr tablet Take 2 tablets (100 mg total) by mouth daily. Take with or immediately following a meal.  . traMADol (ULTRAM) 50 MG tablet Take 1 tablet (50 mg total) by mouth every 8 (eight) hours as needed.  . topiramate (TOPAMAX)  25 MG tablet Take 1 tablet (25 mg total) by mouth 2 (two) times daily.     Review of Systems  Respiratory: Positive for cough.   Neurological: Positive for headaches.       Objective:   Physical Exam  Constitutional: He appears well-developed and well-nourished.  HENT:  Head: Normocephalic.  Mouth/Throat: Oropharynx is clear and moist. No oropharyngeal exudate.  Cardiovascular: Normal rate and regular rhythm.   Pulmonary/Chest: Effort normal and breath sounds normal.     BP 180/111 mmHg  Pulse 72  Temp(Src) 98.6 F (37 C) (Oral)  Ht 5\' 7"  (1.702 m)  Wt 199 lb (90.266 kg)  BMI 31.16 kg/m2      Assessment & Plan:  1. Essential hypertension  - cloNIDine (CATAPRES) tablet 0.1 mg; Take 1 tablet (0.1 mg total) by mouth once. And Rx for same to take for BP>150/100  2. Cough Cough is likely related to allergies and possible bronchospasm. Will ask him to start taking Allegra or Claritin, whichever is cheaper. Would also consider albuterol if no relief

## 2014-09-19 ENCOUNTER — Other Ambulatory Visit: Payer: Self-pay

## 2014-09-19 MED ORDER — GLIPIZIDE 5 MG PO TABS: 5.0000 mg | ORAL_TABLET | Freq: Every day | ORAL | Status: DC

## 2014-12-08 ENCOUNTER — Ambulatory Visit (INDEPENDENT_AMBULATORY_CARE_PROVIDER_SITE_OTHER): Payer: Medicaid Other | Admitting: Family Medicine

## 2014-12-08 ENCOUNTER — Encounter: Payer: Self-pay | Admitting: Family Medicine

## 2014-12-08 VITALS — BP 129/91 | HR 75 | Temp 98.2°F | Ht 67.0 in | Wt 196.0 lb

## 2014-12-08 DIAGNOSIS — E785 Hyperlipidemia, unspecified: Secondary | ICD-10-CM | POA: Diagnosis not present

## 2014-12-08 DIAGNOSIS — E118 Type 2 diabetes mellitus with unspecified complications: Secondary | ICD-10-CM

## 2014-12-08 DIAGNOSIS — B182 Chronic viral hepatitis C: Secondary | ICD-10-CM | POA: Insufficient documentation

## 2014-12-08 DIAGNOSIS — I10 Essential (primary) hypertension: Secondary | ICD-10-CM | POA: Diagnosis not present

## 2014-12-08 LAB — POCT GLYCOSYLATED HEMOGLOBIN (HGB A1C): Hemoglobin A1C: 5.1

## 2014-12-08 MED ORDER — GABAPENTIN 300 MG PO CAPS: 300.0000 mg | ORAL_CAPSULE | Freq: Every day | ORAL | Status: DC

## 2014-12-08 NOTE — Progress Notes (Signed)
Subjective:    Patient ID: Philip [name], male    DOB: 12/11/1955, 59 y.o.   MRN: [id number]  HPI  59-year-old male here to follow-up diabetes hypertension and seizure disorder. He complains today of pain in his legs likely diabetic neuropathy. His last A1c was 8.4 he takes only glipizide. He does monitor his blood pressure and uses clonidine as needed for pressure greater than 150/100.   Patient Active Problem List   Diagnosis Date Noted  . Diabetes mellitus 06/08/2014  . Hyperglycemia 06/07/2014  . Proteinuria 06/07/2014  . Acute renal failure syndrome   . ARF (acute renal failure) 06/06/2014  . Acute renal failure 06/06/2014  . Tobacco user   . Erectile dysfunction   . Chronic cholecystitis with calculus 02/04/2013  . Umbilical hernia 02/04/2013  . Gallstones 01/27/2013  . Hyperlipidemia 12/13/2012  . OSA (obstructive sleep apnea) 11/03/2012  . HTN (hypertension) 09/16/2012  . CVA (cerebral vascular accident) 09/16/2012  . History of seizure disorder, since CVA 09/16/2012  . Unspecified asthma(493.90) 09/16/2012   Outpatient Encounter Prescriptions as of 12/08/2014  Medication Sig  . amLODipine (NORVASC) 10 MG tablet Take 1 tablet (10 mg total) by mouth daily.  . aspirin EC 81 MG tablet Take 81 mg by mouth daily.  . Cholecalciferol (VITAMIN D3) 50000 UNITS CAPS Every other week for 2 months  . cloNIDine (CATAPRES) 0.1 MG tablet Take one tab for BP> 150/100  . fish oil-omega-3 fatty acids 1000 MG capsule Take 2 g by mouth daily.  . glipiZIDE (GLUCOTROL) 5 MG tablet Take 1 tablet (5 mg total) by mouth daily before breakfast.  . metoprolol succinate (TOPROL XL) 50 MG 24 hr tablet Take 2 tablets (100 mg total) by mouth daily. Take with or immediately following a meal.  . topiramate (TOPAMAX) 25 MG tablet Take 1 tablet (25 mg total) by mouth 2 (two) times daily.  . traMADol (ULTRAM) 50 MG tablet Take 1 tablet (50 mg total) by mouth every 8 (eight) hours as needed.  . hydrALAZINE  (APRESOLINE) 25 MG tablet Take 1 tablet (25 mg total) by mouth 3 (three) times daily.   No facility-administered encounter medications on file as of 12/08/2014.     Review of Systems  Constitutional: Negative.   HENT: Negative.   Respiratory: Negative.   Genitourinary: Negative.   Musculoskeletal:       Bilateral leg pain Foot exam shows normal sensation and intact pulses  Psychiatric/Behavioral: Negative.        Objective:   Physical Exam  Constitutional: He appears well-developed and well-nourished.  Cardiovascular: Normal rate and regular rhythm.   Pulmonary/Chest: Effort normal.  Abdominal: Soft.  Musculoskeletal:  Foot exam within normal limits  Psychiatric: He has a normal mood and affect. His behavior is normal.     BP 129/91 mmHg  Pulse 75  Temp(Src) 98.2 F (36.8 C) (Oral)  Ht 5' 7" (1.702 m)  Wt 196 lb (88.905 kg)  BMI 30.69 kg/m2      Assessment & Plan:  1. Hyperlipidemia  - CMP14+EGFR  2. Essential hypertension Blood pressure is fairly well controlled on amlodipine and metoprolol and when necessary clonidine  3. Type 2 diabetes mellitus with complication Expected A1c to be higher today because there are compliance issues but it was amazingly at 5.1 so will continue with glipizide but add Neurontin for leg pain probably related to neuropathy - POCT glycosylated hemoglobin (Hb A1C) - POCT UA - Microalbumin - CMP14+EGFR  Stephen M Miller MD  

## 2014-12-09 LAB — CMP14+EGFR
ALK PHOS: 128 IU/L — AB (ref 39–117)
ALT: 72 IU/L — AB (ref 0–44)
AST: 69 IU/L — ABNORMAL HIGH (ref 0–40)
Albumin/Globulin Ratio: 1.4 (ref 1.1–2.5)
Albumin: 4.3 g/dL (ref 3.5–5.5)
BUN/Creatinine Ratio: 10 (ref 9–20)
BUN: 11 mg/dL (ref 6–24)
Bilirubin Total: 0.4 mg/dL (ref 0.0–1.2)
CALCIUM: 9.6 mg/dL (ref 8.7–10.2)
CO2: 22 mmol/L (ref 18–29)
Chloride: 103 mmol/L (ref 97–108)
Creatinine, Ser: 1.15 mg/dL (ref 0.76–1.27)
GFR calc Af Amer: 80 mL/min/{1.73_m2} (ref >59)
GFR calc non Af Amer: 69 mL/min/{1.73_m2} (ref >59)
GLUCOSE: 59 mg/dL — AB (ref 65–99)
Globulin, Total: 3 g/dL (ref 1.5–4.5)
Potassium: 3.8 mmol/L (ref 3.5–5.2)
Sodium: 141 mmol/L (ref 134–144)
Total Protein: 7.3 g/dL (ref 6.0–8.5)

## 2014-12-18 ENCOUNTER — Other Ambulatory Visit: Payer: Medicaid Other

## 2014-12-24 ENCOUNTER — Other Ambulatory Visit: Payer: Self-pay | Admitting: Family Medicine

## 2014-12-25 ENCOUNTER — Other Ambulatory Visit: Payer: Medicaid Other

## 2014-12-29 ENCOUNTER — Other Ambulatory Visit: Payer: Medicaid Other

## 2014-12-29 DIAGNOSIS — B182 Chronic viral hepatitis C: Secondary | ICD-10-CM

## 2014-12-29 LAB — COMPREHENSIVE METABOLIC PANEL
ALBUMIN: 4.1 g/dL (ref 3.6–5.1)
ALT: 55 U/L — ABNORMAL HIGH (ref 9–46)
AST: 64 U/L — ABNORMAL HIGH (ref 10–35)
Alkaline Phosphatase: 115 U/L (ref 40–115)
BUN: 8 mg/dL (ref 7–25)
CHLORIDE: 106 mmol/L (ref 98–110)
CO2: 22 mmol/L (ref 20–31)
CREATININE: 1.05 mg/dL (ref 0.70–1.33)
Calcium: 9.2 mg/dL (ref 8.6–10.3)
Glucose, Bld: 118 mg/dL — ABNORMAL HIGH (ref 65–99)
POTASSIUM: 4.5 mmol/L (ref 3.5–5.3)
Sodium: 143 mmol/L (ref 135–146)
TOTAL PROTEIN: 7.5 g/dL (ref 6.1–8.1)
Total Bilirubin: 0.4 mg/dL (ref 0.2–1.2)

## 2014-12-29 LAB — CBC WITH DIFFERENTIAL/PLATELET
BASOS ABS: 0.1 10*3/uL (ref 0.0–0.1)
Basophils Relative: 2 % — ABNORMAL HIGH (ref 0–1)
EOS ABS: 0.4 10*3/uL (ref 0.0–0.7)
Eosinophils Relative: 10 % — ABNORMAL HIGH (ref 0–5)
HEMATOCRIT: 48.5 % (ref 39.0–52.0)
Hemoglobin: 17 g/dL (ref 13.0–17.0)
Lymphocytes Relative: 37 % (ref 12–46)
Lymphs Abs: 1.4 10*3/uL (ref 0.7–4.0)
MCH: 31.2 pg (ref 26.0–34.0)
MCHC: 35.1 g/dL (ref 30.0–36.0)
MCV: 89 fL (ref 78.0–100.0)
MONOS PCT: 13 % — AB (ref 3–12)
MPV: 11.6 fL (ref 8.6–12.4)
Monocytes Absolute: 0.5 10*3/uL (ref 0.1–1.0)
Neutro Abs: 1.4 10*3/uL — ABNORMAL LOW (ref 1.7–7.7)
Neutrophils Relative %: 38 % — ABNORMAL LOW (ref 43–77)
Platelets: 151 10*3/uL (ref 150–400)
RBC: 5.45 MIL/uL (ref 4.22–5.81)
RDW: 14 % (ref 11.5–15.5)
WBC: 3.8 10*3/uL — ABNORMAL LOW (ref 4.0–10.5)

## 2014-12-29 LAB — HEPATITIS B SURFACE ANTIGEN: Hepatitis B Surface Ag: NEGATIVE

## 2014-12-29 LAB — HEPATITIS B SURFACE ANTIBODY,QUALITATIVE: Hep B S Ab: NEGATIVE

## 2014-12-29 LAB — PROTIME-INR
INR: 1 (ref <1.50)
PROTHROMBIN TIME: 13.2 s (ref 11.6–15.2)

## 2014-12-29 LAB — HEPATITIS B CORE ANTIBODY, TOTAL: Hep B Core Total Ab: NONREACTIVE

## 2014-12-29 LAB — HEPATITIS A ANTIBODY, TOTAL: Hep A Total Ab: REACTIVE — AB

## 2014-12-29 LAB — IRON: Iron: 87 ug/dL (ref 42–165)

## 2014-12-30 LAB — HIV ANTIBODY (ROUTINE TESTING W REFLEX): HIV: NONREACTIVE

## 2015-01-01 LAB — ANA: ANA: NEGATIVE

## 2015-01-02 LAB — HEPATITIS C RNA QUANTITATIVE
HCV QUANT: 12102121 [IU]/mL — AB (ref <15)
HCV Quantitative Log: 7.08 {Log} — ABNORMAL HIGH (ref <1.18)

## 2015-01-03 LAB — HEPATITIS C GENOTYPE

## 2015-01-10 ENCOUNTER — Ambulatory Visit: Payer: Medicaid Other | Admitting: Family Medicine

## 2015-01-11 ENCOUNTER — Encounter: Payer: Self-pay | Admitting: Family Medicine

## 2015-01-11 ENCOUNTER — Ambulatory Visit (INDEPENDENT_AMBULATORY_CARE_PROVIDER_SITE_OTHER): Payer: Medicaid Other | Admitting: Family Medicine

## 2015-01-11 VITALS — BP 128/86 | HR 71 | Temp 97.9°F | Ht 67.0 in | Wt 197.8 lb

## 2015-01-11 DIAGNOSIS — I10 Essential (primary) hypertension: Secondary | ICD-10-CM

## 2015-01-11 DIAGNOSIS — K0889 Other specified disorders of teeth and supporting structures: Secondary | ICD-10-CM | POA: Insufficient documentation

## 2015-01-11 DIAGNOSIS — K088 Other specified disorders of teeth and supporting structures: Secondary | ICD-10-CM | POA: Diagnosis not present

## 2015-01-11 MED ORDER — TRAMADOL HCL 50 MG PO TABS: 50.0000 mg | ORAL_TABLET | Freq: Three times a day (TID) | ORAL | Status: DC | PRN

## 2015-01-11 NOTE — Progress Notes (Signed)
Patient ID: Philip [name], male   DOB: 11/19/1955, 59 y.o.   MRN: [id number]   HPI  Patient presents today for recheck of high blood pressure and requests tramadol refill  Hypertension Taking meds, good compliance No chest pain, dyspnea, palpitations, leg edema Checking blood pressure at home states that usually 120s to 130s over 70s and 80s States that he watches his diet most days but today had a really salty cheeseburger.  Tooth pain States he has had intermittent tooth pain for several months. He has not sought the help of a dentist yet. Previously he was treating the tooth with tramadol and request for refill No fever, chills, no current pain, no change in appetite, normal PO tolerance  PMH: Smoking status noted - current smoker ROS: Per HPI  Objective: BP 128/86 mmHg  Pulse 71  Temp(Src) 97.9 F (36.6 C) (Oral)  Ht 5\' 7"  (1.702 m)  Wt 197 lb 12.8 oz (89.721 kg)  BMI 30.97 kg/m2 Gen: NAD, alert, cooperative with exam HEENT: NCAT CV: RRR, good S1/S2, no murmur Resp: CTABL, no wheezes, non-labored Ext: No edema, warm Neuro: Alert and oriented, No gross deficits  Assessment and plan:  Tooth pain Intermittent tooth pain management tramadol previously Patient requests refill, given  Explains that the treatment is really getting his teeth addressed by dentist, encouraged him to seek out a dentist  HTN (hypertension) Controlled, initially elevated but improved with rest. Continue current medications including amlodipine, hydralazine, metoprolol,  clonidine on a sliding scale     Meds ordered this encounter  Medications  . traMADol (ULTRAM) 50 MG tablet    Sig: Take 1 tablet (50 mg total) by mouth every 8 (eight) hours as needed.    Dispense:  30 tablet    Refill:  [place], MD [facility] 01/11/2015, 2:49 PM

## 2015-01-11 NOTE — Assessment & Plan Note (Signed)
Intermittent tooth pain management tramadol previously Patient requests refill, given  Explains that the treatment is really getting his teeth addressed by dentist, encouraged him to seek out a dentist

## 2015-01-11 NOTE — Patient Instructions (Signed)
Great to meet you!   Come back in 3 months to talk about diabetes and your blood pressure.   Keep a log of your pressures, just 4-5 per week.

## 2015-01-11 NOTE — Assessment & Plan Note (Signed)
Controlled, initially elevated but improved with rest. Continue current medications including amlodipine, hydralazine, metoprolol,  clonidine on a sliding scale

## 2015-01-31 ENCOUNTER — Encounter: Payer: Self-pay | Admitting: Internal Medicine

## 2015-01-31 ENCOUNTER — Ambulatory Visit (INDEPENDENT_AMBULATORY_CARE_PROVIDER_SITE_OTHER): Payer: Medicaid Other | Admitting: Internal Medicine

## 2015-01-31 VITALS — BP 170/118 | HR 102 | Temp 98.5°F | Ht 67.0 in | Wt 196.0 lb

## 2015-01-31 DIAGNOSIS — B182 Chronic viral hepatitis C: Secondary | ICD-10-CM | POA: Diagnosis not present

## 2015-01-31 MED ORDER — LEDIPASVIR-SOFOSBUVIR 90-400 MG PO TABS: 1.0000 | ORAL_TABLET | Freq: Every day | ORAL | Status: DC

## 2015-01-31 NOTE — Patient Instructions (Signed)
Date 01/31/2015  Dear Philip [name], As discussed in the [facility], your hepatitis C therapy will include the following medications:          Harvoni 90mg /400mg  tablet:           Take 1 tablet by mouth once daily   Please note that ALL MEDICATIONS WILL START ON THE SAME DATE for a total of 12 weeks. ---------------------------------------------------------------- Your HCV Treatment Start Date: TBA   Your HCV genotype:  1a    Liver Fibrosis: TBD    ---------------------------------------------------------------- YOUR PHARMACY CONTACT:   [place] [facility] [place] Phone: [phone] Hours: Monday to Friday 7:30 am to 6:00 pm   Please always contact your pharmacy at least 3-4 business days before you run out of medications to ensure your next month's medication is ready or 1 week prior to running out if you receive it by mail.  Remember, each prescription is for 28 days. ---------------------------------------------------------------- GENERAL NOTES REGARDING YOUR HEPATITIS C MEDICATION:  SOFOSBUVIR/LEDIPASVIR (HARVONI): - Harvoni tablet is taken daily with OR without food. - The tablets are orange. - The tablets should be stored at room temperature.  - Acid reducing agents such as H2 blockers (ie. Pepcid (famotidine), Zantac (ranitidine), Tagamet (cimetidine), Axid (nizatidine) and proton pump inhibitors (ie. Prilosec (omeprazole), Protonix (pantoprazole), Nexium (esomeprazole), or Aciphex (rabeprazole)) can decrease effectiveness of Harvoni. Do not take until you have discussed with a health care provider.    -Antacids that contain magnesium and/or aluminum hydroxide (ie. Milk of Magensia, Rolaids, Gaviscon, Maalox, Mylanta, an dArthritis Pain Formula)can reduce absorption of Harvoni, so take them at least 4 hours before or after Harvoni.  -Calcium carbonate (calcium supplements or antacids such as Tums, Caltrate,  Os-Cal)needs to be taken at least 4 hours hours before or after Harvoni.  -St. John's wort or any products that contain St. John's wort like some herbal supplements  Please inform the office prior to starting any of these medications.  - The common side effects associated with Harvoni include:      1. Fatigue      2. Headache      3. Nausea      4. Diarrhea      5. Insomnia   Support Path is a suite of resources designed to help patients start with HARVONI and move toward treatment completion [place] helps patients access therapy and get off to an efficient start  Benefits investigation and prior authorization support Co-pay and other financial assistance A specialty pharmacy finder CO-PAY COUPON The [place] co-pay coupon may help eligible patients lower their out-of-pocket costs. With a co-pay coupon, most eligible patients may pay no more than $5 per co-pay (restrictions apply) www.harvoni.com call (931)063-2214 Not valid for patients enrolled in government healthcare prescription drug programs, such as Medicare Part D and Medicaid. Patients in the coverage gap known as the "donut hole" also are not eligible The HARVONI co-pay coupon program will cover the out-of-pocket costs for HARVONI prescriptions up to a maximum of 25% of the catalog price of a 12-week regimen of HARVONI  Please note that this only lists the most common side effects and is NOT a comprehensive list of the potential side effects of these medications. For more information, please review the drug information sheets that come with your medication package from the pharmacy.  ---------------------------------------------------------------- GENERAL HELPFUL HINTS ON HCV THERAPY: 1. No alcohol. 2. Protect against sun-sensitivity/sunburns (wear sunglasses, hat, long  sleeves, pants and sunscreen). 3. Stay well-hydrated/well-moisturized. 4. Notify the [facility] of any changes in your other  over-the-counter/herbal or prescription medications. 5. If you miss a dose of your medication, take the missed dose as soon as you remember. Return to your regular time/dose schedule the next day.  6.  Do not stop taking your medications without first talking with your healthcare provider. 7.  You may take Tylenol (acetaminophen), as long as the dose is less than 2000 mg (OR no more than 4 tablets of the Tylenol Extra Strengths 500mg  tablet) in 24 hours. 8.  You will need to obtain routine labs and/or office visits at [facility] at weeks 4 and 12 as well as 12 and 24 weeks after completion of treatment.   Scharlene Gloss, [place] [facility] [place], [place]  [id number] [phone]

## 2015-01-31 NOTE — Progress Notes (Signed)
HPI:  +Philip [name] is a 59 y.o. male who presents for initial evaluation and management of chronic hepatitis C.  Patient tested positive earlier this year. Hepatitis C risk factors present are: IV drug abuse (details: over 30 years ago). Patient denies history of blood transfusion, intranasal drug use, multiple sexual partners, renal dialysis, sexual contact with person with liver disease, tattoos. Patient has had other studies performed. Results: hepatitis C RNA by PCR, result: positive. Patient has not had prior treatment for Hepatitis C. Patient does not have a past history of liver disease. Patient does not have a family history of liver disease.  Has never been treated.      Patient does have documented immunity to Hepatitis A. Patient does not have documented immunity to Hepatitis B.    Review of Systems:  Constitutional: Negative for fatigue, weight loss.  HENT: Negative for hearing loss, ear pain, neck pain, tinnitus and ear discharge.  Eyes: Negative for icterus, discharge and redness.  Respiratory: Negative fordypsnea, wheezing.  Cardiovascular: Negative for chest pain, palpitations, orthopnea, claudication and leg swelling.  Gastrointestinal: Negative for nausea, vomiting, abdominal distention and abdominal pain.Negative for  Genitourinary: Negative for dysuria, urgency, frequency, hematuria and flank pain.  Musculoskeletal: Negative for arthralgias, arthritis Skin: Negative for itching and rash. Neurological: Negative for dizziness and weakness. Endo/Heme/Allergies: Negative for environmental allergies and polydipsia. Does not bruise/bleed easily.      Past Medical History  Diagnosis Date  . Hypertension     takes Hydralazine,Maxzide,Clonidine,and Amlodipine daily  . Hypertension     also takes Metoprolol daily  . Nausea     takes Zofran daily  . History of migraine     takes Topamax daily  . Stroke     .  Short term memory loss.  Marland Kitchen Headache(784.0)     "Headache, I  dont think they are migranes"  . Tobacco user   . Erectile dysfunction   . Sleep apnea     CPAP- tries to do it everyday.  . Seizures     off Dilantin daily.  last one was when he had a stroke.  . Diabetes mellitus without complication     borderline  . Short-term memory loss     due to stroke   . Hepatitis C     Prior to Admission medications   Medication Sig Start Date End Date Taking? Authorizing Provider  amLODipine (NORVASC) 10 MG tablet TAKE ONE TABLET BY MOUTH ONCE DAILY 12/25/14  Yes Wardell Honour, MD  aspirin EC 81 MG tablet Take 81 mg by mouth daily.   Yes Historical Provider, MD  Cholecalciferol (VITAMIN D3) 50000 UNITS CAPS Every other week for 2 months 06/09/14  Yes Wardell Honour, MD  cloNIDine (CATAPRES) 0.1 MG tablet Take one tab for BP> 150/100 08/30/14  Yes Wardell Honour, MD  fish oil-omega-3 fatty acids 1000 MG capsule Take 2 g by mouth daily.   Yes Historical Provider, MD  gabapentin (NEURONTIN) 300 MG capsule TAKE ONE CAPSULE BY MOUTH AT BEDTIME 12/25/14  Yes Wardell Honour, MD  glipiZIDE (GLUCOTROL) 5 MG tablet Take 1 tablet (5 mg total) by mouth daily before breakfast. 09/19/14  Yes Chipper Herb, MD  hydrALAZINE (APRESOLINE) 25 MG tablet Take 1 tablet (25 mg total) by mouth 3 (three) times daily. 06/28/14  Yes Wardell Honour, MD  metoprolol succinate (TOPROL XL) 50 MG 24 hr tablet Take 2 tablets (100 mg total) by mouth daily. Take with or immediately following  a meal. 08/14/14  Yes Wardell Honour, MD  topiramate (TOPAMAX) 25 MG tablet TAKE ONE TABLET BY MOUTH TWICE DAILY 12/25/14  Yes Wardell Honour, MD  traMADol (ULTRAM) 50 MG tablet Take 1 tablet (50 mg total) by mouth every 8 (eight) hours as needed. 01/11/15  Yes Timmothy Euler, MD  Ledipasvir-Sofosbuvir (HARVONI) 90-400 MG TABS Take 1 tablet by mouth daily. 01/31/15   Thayer Headings, MD    No Known Allergies  Social History  Substance Use Topics  . Smoking status: Current Every Day Smoker -- 0.20  packs/day for 25 years    Types: Cigarettes    Start date: 05/26/1978  . Smokeless tobacco: Never Used  . Alcohol Use: 2.4 oz/week    3 Cans of beer, 1 Standard drinks or equivalent per week     Comment: OCCASSIONAL    Family History  Problem Relation Age of Onset  . Heart disease Maternal Grandmother   . Heart disease Maternal Grandfather   . Heart disease Paternal Grandmother   . Heart disease Paternal Grandfather   . Cancer Cousin   . Hypertension Mother   . Colon cancer Maternal Uncle   . Rectal cancer Neg Hx   . Stomach cancer Neg Hx      Objective:   Filed Vitals:   01/31/15 1522  BP: 170/118  Pulse: 102  Temp: 98.5 F (36.9 C)   GEN: in no apparent distress and alert HEENT: anicteric Cardiac: Cor RRR and No murmurs Lungs: clear Abdomen: Bowel sounds are normal, liver is not enlarged, spleen is not enlarged Ext: peripheral pulses normal, no pedal edema, no clubbing or cyanosis Skin: negative for - jaundice, spider hemangioma, telangiectasia, palmar erythema, ecchymosis and atrophy Musculoskeletal: no joint swelling  Laboratory Genotype:  Lab Results  Component Value Date   HCVGENOTYPE 1a 12/29/2014   HCV viral load:  Lab Results  Component Value Date   HCVQUANT [id number]* 12/29/2014   Lab Results  Component Value Date   WBC 3.8* 12/29/2014   HGB 17.0 12/29/2014   HCT 48.5 12/29/2014   MCV 89.0 12/29/2014   PLT 151 12/29/2014    Lab Results  Component Value Date   CREATININE 1.05 12/29/2014   BUN 8 12/29/2014   NA 143 12/29/2014   K 4.5 12/29/2014   CL 106 12/29/2014   CO2 22 12/29/2014    Lab Results  Component Value Date   ALT 55* 12/29/2014   AST 64* 12/29/2014   ALKPHOS 115 12/29/2014     CHILD-PUGH A  5-6 points: Child class A 7-9 points: Child class B 10-15 points: Child class C  Lab Results  Component Value Date   INR 1.00 12/29/2014   BILITOT 0.4 12/29/2014   ALBUMIN 4.1 12/29/2014    Encephalopathy None (1  point) Grade 1: Altered mood/confusion (2 points) Grade 2: Inappropriate behavior, impending stupor, somnolence (2 points) Grade 3: Markedly confused, stuporous but arousable (3 points) Grade 4: Comatose/unresponsive (3 points) Ascites Absent (1 point) Slight (2 points) Moderate (3 points) Bilirubin  < 2 mg/dL (1 point) 2-3 mg/dL (2 points) > 3 mg/dL (3 points) Albumin > 3.5 g/dL (1 point) 2.8-3.5 g/dL (2 points) < 2.8 g/dL (3 points) Prothrombin time prolongation Less than 4 seconds above control/INR < 1.7 (1 point) 4-6 seconds above control/INR 1.7-2.3 (2 points) More than 6 seconds above control/INR > 2.3 (3 points)   Assessment: Chronic Hepatitis C genotype 1a I discussed with the patient the natural history and progression of  chronic hepatitis C infection including about 30% of people who develop cirrhosis of the liver and once cirrhosis is established there is a 2-7% risk per year of liver cancer and liver failure.    Plan: 1) Patient counseled extensively on limiting acetaminophen to no more than 2 grams daily, avoidance of alcohol. 2) Transmission discussed with patient including sexual transmission, sharing razors and toothbrush.   3) Will need referral to gastroenterology if concern for cirrhosis 4) Will need referral for substance abuse counseling: No. 5) Will prescribe Harvoni for 12 weeks, though likely won't be approved until after elastography is done 6) Hepatitis A vaccine No. 7) Hepatitis B vaccine Yes.   8) Pneumovax vaccine if concern for cirrhosis 9) will follow up after elastography

## 2015-03-01 ENCOUNTER — Ambulatory Visit (HOSPITAL_COMMUNITY): Payer: Medicaid Other

## 2015-03-04 ENCOUNTER — Other Ambulatory Visit: Payer: Self-pay | Admitting: Family Medicine

## 2015-03-13 ENCOUNTER — Ambulatory Visit (HOSPITAL_COMMUNITY)
Admission: RE | Admit: 2015-03-13 | Discharge: 2015-03-13 | Disposition: A | Discharge status: Home / Self Care | Payer: Medicaid Other | Source: Ambulatory Visit | Attending: Internal Medicine | Admitting: Internal Medicine

## 2015-03-13 DIAGNOSIS — B182 Chronic viral hepatitis C: Secondary | ICD-10-CM | POA: Insufficient documentation

## 2015-03-13 DIAGNOSIS — R932 Abnormal findings on diagnostic imaging of liver and biliary tract: Secondary | ICD-10-CM | POA: Insufficient documentation

## 2015-03-14 ENCOUNTER — Encounter: Payer: Self-pay | Admitting: Pharmacy Technician

## 2015-03-15 ENCOUNTER — Ambulatory Visit: Payer: Medicaid Other | Admitting: Internal Medicine

## 2015-03-29 ENCOUNTER — Ambulatory Visit: Payer: Medicaid Other

## 2015-04-03 ENCOUNTER — Other Ambulatory Visit: Payer: Medicaid Other

## 2015-04-03 ENCOUNTER — Ambulatory Visit: Payer: Medicaid Other | Admitting: Pharmacist Clinician (PhC)/ Clinical Pharmacy Specialist

## 2015-04-03 DIAGNOSIS — B182 Chronic viral hepatitis C: Secondary | ICD-10-CM

## 2015-04-03 NOTE — Progress Notes (Signed)
Patient ID: Philip [name], male   DOB: 1956/01/24, 59 y.o.   MRN: [id number] HPI: Philip [name] is a 59 y.o. male who his here for his 2wks hep C f/u.   Lab Results  Component Value Date   HCVGENOTYPE 1a 12/29/2014    Allergies: No Known Allergies  Vitals:    Past Medical History: Past Medical History  Diagnosis Date  . Hypertension     takes Hydralazine,Maxzide,Clonidine,and Amlodipine daily  . Hypertension     also takes Metoprolol daily  . Nausea     takes Zofran daily  . History of migraine     takes Topamax daily  . Stroke     .  Short term memory loss.  Marland Kitchen Headache(784.0)     "Headache, I dont think they are migranes"  . Tobacco user   . Erectile dysfunction   . Sleep apnea     CPAP- tries to do it everyday.  . Seizures     off Dilantin daily.  last one was when he had a stroke.  . Diabetes mellitus without complication     borderline  . Short-term memory loss     due to stroke   . Hepatitis C     Social History: Social History   Social History  . Marital Status: Single    Spouse Name: N/A  . Number of Children: N/A  . Years of Education: N/A   Social History Main Topics  . Smoking status: Current Every Day Smoker -- 0.20 packs/day for 25 years    Types: Cigarettes    Start date: 05/26/1978  . Smokeless tobacco: Never Used  . Alcohol Use: 2.4 oz/week    3 Cans of beer, 1 Standard drinks or equivalent per week     Comment: OCCASSIONAL  . Drug Use: No  . Sexual Activity: Not on file   Other Topics Concern  . Not on file   Social History Narrative    Labs: HEP B S AB (no units)  Date Value  12/29/2014 NEG   HEPATITIS B SURFACE AG (no units)  Date Value  12/29/2014 NEGATIVE   HCV AB (no units)  Date Value  06/07/2014 Reactive*    Lab Results  Component Value Date   HCVGENOTYPE 1a 12/29/2014    Hepatitis C RNA quantitative Latest Ref Rng 12/29/2014 06/08/2014 06/07/2014  HCV Quantitative <15 IU/mL [id number](Q) 7591638(G) 6659935(T)  HCV  Quantitative Log <1.18 log 10 7.08(H) 6.57(H) 6.71(H)    AST  Date Value  12/29/2014 64 U/L*  12/08/2014 69 IU/L*  06/07/2014 42 U/L*   ALT  Date Value  12/29/2014 55 U/L*  12/08/2014 72 IU/L*  06/07/2014 41 U/L   INR (no units)  Date Value  12/29/2014 1.00    CrCl: CrCl cannot be calculated (Unknown ideal weight.).  Fibrosis Score: F3/4 as assessed by [facility]  Child-Pugh Score: Class A  Previous Treatment Regimen: Naive  Assessment: 59 yo who is here for his hep C f/u. He started on Harvoni about 3 wks ago. He has had no issues with Harvoni. He has not missed any dose of Harvoni. No interactions identified on his current med list. However, he did stated that occasionally he takes some Rolaids. I told him to either separated out by several hours our avoid it completely. He is going to try to avoid it. Since he has medicaid, we would need a VL for the extension. We are going to get one today since he is far out enough. Encourage him  to cont his great compliance.   Recommendations:  Cont Harvoni 1 PO qday Hep C VL today  Onnie Boer [place], [place].D., BCPS, AAHIVP Clinical Infectious [facility] 04/03/2015, 1:53 PM

## 2015-04-04 ENCOUNTER — Telehealth: Payer: Self-pay | Admitting: Pharmacist Clinician (PhC)/ Clinical Pharmacy Specialist

## 2015-04-04 LAB — HEPATITIS C RNA QUANTITATIVE

## 2015-04-04 NOTE — Telephone Encounter (Signed)
Vandy called to ask if there is any issue with ASA and his Harvoni. Told him that it'd fine to take together.

## 2015-04-26 ENCOUNTER — Other Ambulatory Visit: Payer: Self-pay | Admitting: Family Medicine

## 2015-04-27 NOTE — Telephone Encounter (Signed)
last seen 01/11/15  Dr Wendi Snipes

## 2015-05-01 ENCOUNTER — Other Ambulatory Visit: Payer: Self-pay

## 2015-05-01 MED ORDER — HYDRALAZINE HCL 25 MG PO TABS: 25.0000 mg | ORAL_TABLET | Freq: Three times a day (TID) | ORAL | Status: DC

## 2015-05-03 ENCOUNTER — Other Ambulatory Visit: Payer: Self-pay | Admitting: Family Medicine

## 2015-05-03 NOTE — Telephone Encounter (Signed)
New script had been ordered but not called in.  Script given to pharmacist to fill.

## 2015-06-04 ENCOUNTER — Ambulatory Visit: Payer: Medicaid Other | Admitting: Internal Medicine

## 2015-06-10 ENCOUNTER — Other Ambulatory Visit: Payer: Self-pay | Admitting: Family Medicine

## 2015-06-18 ENCOUNTER — Telehealth: Payer: Self-pay | Admitting: Family Medicine

## 2015-06-18 ENCOUNTER — Telehealth: Payer: Self-pay | Admitting: Pharmacist

## 2015-06-18 ENCOUNTER — Other Ambulatory Visit: Payer: Self-pay | Admitting: Family Medicine

## 2015-06-18 NOTE — Telephone Encounter (Signed)
Received compliance report from Medicaid that patient has only been 68% compliant with glipizide. I spoke with patient and since November 2016 he has been taking regularly.  He is also due follow up with Dr Sabra Heck - appt made for Feb 2017.

## 2015-06-18 NOTE — Telephone Encounter (Signed)
Spoke with pt's sister regarding RX appt scheduled for pt

## 2015-06-19 ENCOUNTER — Ambulatory Visit (INDEPENDENT_AMBULATORY_CARE_PROVIDER_SITE_OTHER): Payer: Medicaid Other | Admitting: Family Medicine

## 2015-06-19 ENCOUNTER — Encounter: Payer: Self-pay | Admitting: Family Medicine

## 2015-06-19 VITALS — BP 122/86 | HR 67 | Temp 98.1°F | Ht 67.0 in | Wt 197.0 lb

## 2015-06-19 DIAGNOSIS — I10 Essential (primary) hypertension: Secondary | ICD-10-CM

## 2015-06-19 DIAGNOSIS — J069 Acute upper respiratory infection, unspecified: Secondary | ICD-10-CM

## 2015-06-19 DIAGNOSIS — B182 Chronic viral hepatitis C: Secondary | ICD-10-CM | POA: Diagnosis not present

## 2015-06-19 MED ORDER — BENZONATATE 200 MG PO CAPS: 200.0000 mg | ORAL_CAPSULE | Freq: Two times a day (BID) | ORAL | Status: DC | PRN

## 2015-06-19 MED ORDER — HYDRALAZINE HCL 25 MG PO TABS: 25.0000 mg | ORAL_TABLET | Freq: Three times a day (TID) | ORAL | Status: DC

## 2015-06-19 MED ORDER — METOPROLOL SUCCINATE ER 50 MG PO TB24: 50.0000 mg | ORAL_TABLET | Freq: Every day | ORAL | Status: DC

## 2015-06-19 MED ORDER — AMLODIPINE BESYLATE 10 MG PO TABS: 10.0000 mg | ORAL_TABLET | Freq: Every day | ORAL | Status: DC

## 2015-06-19 MED ORDER — GLIPIZIDE 5 MG PO TABS: ORAL_TABLET | ORAL | Status: DC

## 2015-06-19 MED ORDER — TOPIRAMATE 25 MG PO TABS: 25.0000 mg | ORAL_TABLET | Freq: Two times a day (BID) | ORAL | Status: DC

## 2015-06-19 MED ORDER — GABAPENTIN 300 MG PO CAPS: 300.0000 mg | ORAL_CAPSULE | Freq: Every day | ORAL | Status: DC

## 2015-06-19 NOTE — Progress Notes (Signed)
Subjective:    Patient ID: Philip [name], male    DOB: Jan 06, 1956, 60 y.o.   MRN: [id number]  HPI  60 year old gentleman with hepatitis C. He is accompanied today by her sister who also thinks his abdomen looks distended. Patient does not feel as though he needs to be here.  He does complain of some cough and a head cold that started today. Another sister had similar symptoms that he has been exposed to.  Patient Active Problem List   Diagnosis Date Noted  . Tooth pain 01/11/2015  . Chronic hepatitis C without hepatic coma ([place])   . Diabetes mellitus ([place]) 06/08/2014  . Hyperglycemia 06/07/2014  . Proteinuria 06/07/2014  . Acute renal failure syndrome ([place])   . ARF (acute renal failure) ([place]) 06/06/2014  . Acute renal failure ([place]) 06/06/2014  . Tobacco user   . Erectile dysfunction   . Chronic cholecystitis with calculus 02/04/2013  . Umbilical hernia 43/15/4008  . Gallstones 01/27/2013  . Hyperlipidemia 12/13/2012  . OSA (obstructive sleep apnea) 11/03/2012  . HTN (hypertension) 09/16/2012  . CVA (cerebral vascular accident) 09/16/2012  . History of seizure disorder, since CVA 09/16/2012  . Unspecified asthma(493.90) 09/16/2012   Outpatient Encounter Prescriptions as of 06/19/2015  Medication Sig  . amLODipine (NORVASC) 10 MG tablet TAKE ONE TABLET BY MOUTH ONCE DAILY  . aspirin EC 81 MG tablet Take 81 mg by mouth daily.  . fish oil-omega-3 fatty acids 1000 MG capsule Take 2 g by mouth daily.  Marland Kitchen gabapentin (NEURONTIN) 300 MG capsule TAKE ONE CAPSULE BY MOUTH AT BEDTIME  . glipiZIDE (GLUCOTROL) 5 MG tablet TAKE ONE TABLET BY MOUTH ONCE DAILY BEFORE BREAKFAST  . hydrALAZINE (APRESOLINE) 25 MG tablet Take 1 tablet (25 mg total) by mouth 3 (three) times daily.  . Ledipasvir-Sofosbuvir (HARVONI) 90-400 MG TABS Take 1 tablet by mouth daily.  Marland Kitchen topiramate (TOPAMAX) 25 MG tablet TAKE ONE TABLET BY MOUTH TWICE DAILY  . [DISCONTINUED] cloNIDine (CATAPRES) 0.1 MG tablet Take one tab for  BP> 150/100  . metoprolol succinate (TOPROL-XL) 50 MG 24 hr tablet TAKE TWO TABLETS BY MOUTH ONCE DAILY WITH OR IMMEDIATELY FOLLOWING A MEAL  . [DISCONTINUED] Cholecalciferol (VITAMIN D3) 50000 UNITS CAPS Every other week for 2 months  . [DISCONTINUED] traMADol (ULTRAM) 50 MG tablet Take 1 tablet (50 mg total) by mouth every 8 (eight) hours as needed.   No facility-administered encounter medications on file as of 06/19/2015.      Review of Systems  Constitutional: Negative.   Respiratory: Positive for cough.   Cardiovascular: Negative.   Gastrointestinal: Negative.   Neurological: Negative.   Psychiatric/Behavioral: Negative.        Objective:   Physical Exam  Constitutional: He is oriented to person, place, and time. He appears well-developed and well-nourished.  HENT:  Head: Normocephalic.  Mouth/Throat: Oropharynx is clear and moist.  Cardiovascular: Normal rate, regular rhythm and normal heart sounds.   Pulmonary/Chest: Effort normal and breath sounds normal.  Neurological: He is alert and oriented to person, place, and time.          Assessment & Plan:  1. Essential hypertension There was a question about whether he was on one or 2 metoprolol. He has been taking 1 and pressure and pulse rate are good so I think he should stay on that dose - CMP14+EGFR  2. Chronic hepatitis C without hepatic coma [facility]) He is followed by gastroenterology. Last liver enzymes were only slightly elevated. Will follow today - CMP14+EGFR  3. URI, acute I see no evidence of bacterial infection will treat symptoms with Tessalon Perles 1 twice a day for cough.   Wardell Honour MD

## 2015-06-20 ENCOUNTER — Telehealth: Payer: Self-pay | Admitting: Family Medicine

## 2015-06-20 LAB — CMP14+EGFR
A/G RATIO: 1.4 (ref 1.1–2.5)
ALK PHOS: 89 IU/L (ref 39–117)
ALT: 40 IU/L (ref 0–44)
AST: 43 IU/L — AB (ref 0–40)
Albumin: 4.3 g/dL (ref 3.5–5.5)
BILIRUBIN TOTAL: 0.4 mg/dL (ref 0.0–1.2)
BUN/Creatinine Ratio: 7 — ABNORMAL LOW (ref 9–20)
BUN: 10 mg/dL (ref 6–24)
CHLORIDE: 102 mmol/L (ref 96–106)
CO2: 24 mmol/L (ref 18–29)
Calcium: 9.7 mg/dL (ref 8.7–10.2)
Creatinine, Ser: 1.42 mg/dL — ABNORMAL HIGH (ref 0.76–1.27)
GFR calc non Af Amer: 54 mL/min/{1.73_m2} — ABNORMAL LOW (ref >59)
GFR, EST AFRICAN AMERICAN: 62 mL/min/{1.73_m2} (ref >59)
GLUCOSE: 221 mg/dL — AB (ref 65–99)
Globulin, Total: 3 g/dL (ref 1.5–4.5)
POTASSIUM: 4.1 mmol/L (ref 3.5–5.2)
Sodium: 140 mmol/L (ref 134–144)
TOTAL PROTEIN: 7.3 g/dL (ref 6.0–8.5)

## 2015-06-20 MED ORDER — HYDROCODONE-HOMATROPINE 5-1.5 MG/5ML PO SYRP: 5.0000 mL | ORAL_SOLUTION | Freq: Four times a day (QID) | ORAL | Status: DC | PRN

## 2015-06-20 NOTE — Telephone Encounter (Signed)
Pt aware written Rx is at front desk ready for pickup  

## 2015-06-28 ENCOUNTER — Encounter: Payer: Self-pay | Admitting: Internal Medicine

## 2015-06-28 ENCOUNTER — Ambulatory Visit (INDEPENDENT_AMBULATORY_CARE_PROVIDER_SITE_OTHER): Payer: Medicaid Other | Admitting: Internal Medicine

## 2015-06-28 VITALS — BP 142/95 | HR 91 | Temp 98.3°F | Ht 67.0 in | Wt 195.0 lb

## 2015-06-28 DIAGNOSIS — Z23 Encounter for immunization: Secondary | ICD-10-CM

## 2015-06-28 DIAGNOSIS — Z720 Tobacco use: Secondary | ICD-10-CM | POA: Diagnosis not present

## 2015-06-28 DIAGNOSIS — K7460 Unspecified cirrhosis of liver: Secondary | ICD-10-CM | POA: Diagnosis not present

## 2015-06-28 DIAGNOSIS — B182 Chronic viral hepatitis C: Secondary | ICD-10-CM | POA: Diagnosis present

## 2015-06-28 NOTE — Assessment & Plan Note (Signed)
I will check end of treatment lab today, though still has a few doses left.  Then he can return in 4 months for SVR12.

## 2015-06-28 NOTE — Assessment & Plan Note (Signed)
Encouraged cessation.

## 2015-06-28 NOTE — Assessment & Plan Note (Addendum)
Pneumovax, hepatitis B series.   I will refer him to GI in [place] Dr. Candice Camp for ? EGD for varices screening. He will need every 6 month [place] screen with a limited abdominal ultrasound.  I can do it here but patient lives near [place] so can be done by PCP or GI?  Next due April 2017.

## 2015-06-28 NOTE — Progress Notes (Signed)
   Subjective:    Patient ID: Philip [name], male    DOB: 04/17/1956, 60 y.o.   MRN: [id number]  HPI Here for follow up of HCV.   Has genotype 1a, viral load 12 million, started on Harvoni for 12 weeks.  Early viral load undetectable.  LFTs also checked by PCP and improved.  Elastography with F3/4.  Platelets have been slightly low in the past, normal now.  Drinks occasional beer but less than weekly.  Has 3 pills of Harvoni left to go.  Refuses flu shot. No weight loss.     Review of Systems  Constitutional: Negative for fatigue.  Gastrointestinal: Negative for nausea and diarrhea.  Skin: Negative for rash.       Objective:   Physical Exam  Constitutional: He appears well-developed and well-nourished. No distress.  Eyes: No scleral icterus.  Cardiovascular: Normal rate, regular rhythm and normal heart sounds.   No murmur heard. Skin: No rash noted.    Social History   Social History  . Marital Status: Single    Spouse Name: N/A  . Number of Children: N/A  . Years of Education: N/A   Occupational History  . Not on file.   Social History Main Topics  . Smoking status: Current Every Day Smoker -- 0.20 packs/day for 25 years    Types: Cigarettes    Start date: 05/26/1978  . Smokeless tobacco: Never Used  . Alcohol Use: 2.4 oz/week    3 Cans of beer, 1 Standard drinks or equivalent per week     Comment: OCCASSIONAL  . Drug Use: No  . Sexual Activity: Not on file   Other Topics Concern  . Not on file   Social History Narrative        Assessment & Plan:

## 2015-07-02 ENCOUNTER — Encounter: Payer: Self-pay | Admitting: Gastroenterology

## 2015-07-02 LAB — HEPATITIS C RNA QUANTITATIVE: HCV QUANT: NOT DETECTED [IU]/mL (ref <15)

## 2015-07-04 ENCOUNTER — Ambulatory Visit (INDEPENDENT_AMBULATORY_CARE_PROVIDER_SITE_OTHER): Payer: Medicaid Other | Admitting: Family Medicine

## 2015-07-04 ENCOUNTER — Encounter: Payer: Self-pay | Admitting: Family Medicine

## 2015-07-04 VITALS — BP 128/94 | HR 74 | Temp 98.1°F | Ht 67.0 in | Wt 196.2 lb

## 2015-07-04 DIAGNOSIS — I10 Essential (primary) hypertension: Secondary | ICD-10-CM

## 2015-07-04 DIAGNOSIS — E118 Type 2 diabetes mellitus with unspecified complications: Secondary | ICD-10-CM | POA: Diagnosis not present

## 2015-07-04 LAB — POCT GLYCOSYLATED HEMOGLOBIN (HGB A1C): Hemoglobin A1C: 5.1

## 2015-07-04 NOTE — Progress Notes (Signed)
   Subjective:    Patient ID: Philip [name], male    DOB: 1955/07/10, 60 y.o.   MRN: [id number]  HPI  60 year old gentleman here to follow-up with diabetes. He has a history of chronic hepatitis C and was treated with Harvoni. He does not seem to know much about this. Plan to check liver function tests today to see how that's looking.   blood pressures are treated with hydralazine and metoprolol amlodipine pressures have been not quite at goal previously in today is elevated at 128/94.  Sugars have been well controlled on just glipizide. Last A1c in July 2016 was  Off 0.1.  Patient Active Problem List   Diagnosis Date Noted  . Hepatic cirrhosis ([place]) 06/28/2015  . Tooth pain 01/11/2015  . Chronic hepatitis C without hepatic coma ([place])   . Diabetes mellitus ([place]) 06/08/2014  . Hyperglycemia 06/07/2014  . Proteinuria 06/07/2014  . Tobacco user   . Erectile dysfunction   . Chronic cholecystitis with calculus 02/04/2013  . Umbilical hernia 40/98/1191  . Gallstones 01/27/2013  . Hyperlipidemia 12/13/2012  . OSA (obstructive sleep apnea) 11/03/2012  . HTN (hypertension) 09/16/2012  . CVA (cerebral vascular accident) 09/16/2012  . History of seizure disorder, since CVA 09/16/2012  . Unspecified asthma(493.90) 09/16/2012   Outpatient Encounter Prescriptions as of 07/04/2015  Medication Sig  . amLODipine (NORVASC) 10 MG tablet Take 1 tablet (10 mg total) by mouth daily.  Marland Kitchen aspirin EC 81 MG tablet Take 81 mg by mouth daily.  . benzonatate (TESSALON) 200 MG capsule Take 1 capsule (200 mg total) by mouth 2 (two) times daily as needed for cough.  . fish oil-omega-3 fatty acids 1000 MG capsule Take 2 g by mouth daily.  Marland Kitchen gabapentin (NEURONTIN) 300 MG capsule Take 1 capsule (300 mg total) by mouth at bedtime.  Marland Kitchen glipiZIDE (GLUCOTROL) 5 MG tablet TAKE ONE TABLET BY MOUTH ONCE DAILY BEFORE BREAKFAST  . hydrALAZINE (APRESOLINE) 25 MG tablet Take 1 tablet (25 mg total) by mouth 3 (three) times daily.  Marland Kitchen  HYDROcodone-homatropine (HYCODAN) 5-1.5 MG/5ML syrup Take 5 mLs by mouth every 6 (six) hours as needed for cough.  . Ledipasvir-Sofosbuvir (HARVONI) 90-400 MG TABS Take 1 tablet by mouth daily.  . metoprolol succinate (TOPROL-XL) 50 MG 24 hr tablet Take 1 tablet (50 mg total) by mouth daily. Take with or immediately following a meal.  . topiramate (TOPAMAX) 25 MG tablet Take 1 tablet (25 mg total) by mouth 2 (two) times daily.   No facility-administered encounter medications on file as of 07/04/2015.      Review of Systems  Constitutional: Negative.   Respiratory: Negative.   Cardiovascular: Negative.   Neurological: Negative.   Psychiatric/Behavioral: Negative.        Objective:   Physical Exam  Constitutional: He appears well-developed and well-nourished.  Cardiovascular: Normal rate and regular rhythm.   Pulmonary/Chest: Effort normal and breath sounds normal.          Assessment & Plan:  1. Essential hypertension A nephrologist has some chronic kidney disease continue with current medicines - POCT glycosylated hemoglobin (Hb A1C) - CMP14+EGFR - Lipid panel - Microalbumin / creatinine urine ratio  2. Type 2 diabetes mellitus with complication, without long-term current use of insulin (HCC) A1c has been good in the past need to update today. - POCT glycosylated hemoglobin (Hb A1C) - CMP14+EGFR - Lipid panel - Microalbumin / creatinine urine ratio   Wardell Honour MD

## 2015-07-05 LAB — CMP14+EGFR
ALK PHOS: 91 IU/L (ref 39–117)
ALT: 22 IU/L (ref 0–44)
AST: 22 IU/L (ref 0–40)
Albumin/Globulin Ratio: 1.2 (ref 1.1–2.5)
Albumin: 3.7 g/dL (ref 3.5–5.5)
BILIRUBIN TOTAL: 0.5 mg/dL (ref 0.0–1.2)
BUN/Creatinine Ratio: 9 (ref 9–20)
BUN: 11 mg/dL (ref 6–24)
CO2: 22 mmol/L (ref 18–29)
Calcium: 8.8 mg/dL (ref 8.7–10.2)
Chloride: 102 mmol/L (ref 96–106)
Creatinine, Ser: 1.23 mg/dL (ref 0.76–1.27)
GFR calc Af Amer: 74 mL/min/{1.73_m2} (ref >59)
GFR calc non Af Amer: 64 mL/min/{1.73_m2} (ref >59)
GLUCOSE: 191 mg/dL — AB (ref 65–99)
Globulin, Total: 3 g/dL (ref 1.5–4.5)
Potassium: 3.8 mmol/L (ref 3.5–5.2)
Sodium: 142 mmol/L (ref 134–144)
TOTAL PROTEIN: 6.7 g/dL (ref 6.0–8.5)

## 2015-07-05 LAB — LIPID PANEL
CHOLESTEROL TOTAL: 178 mg/dL (ref 100–199)
Chol/HDL Ratio: 3.5 ratio units (ref 0.0–5.0)
HDL: 51 mg/dL (ref >39)
LDL Calculated: 82 mg/dL (ref 0–99)
Triglycerides: 225 mg/dL — ABNORMAL HIGH (ref 0–149)
VLDL CHOLESTEROL CAL: 45 mg/dL — AB (ref 5–40)

## 2015-07-05 LAB — MICROALBUMIN / CREATININE URINE RATIO
Creatinine, Urine: 289.6 mg/dL
MICROALB/CREAT RATIO: 14.6 mg/g creat (ref 0.0–30.0)
Microalbumin, Urine: 42.3 ug/mL

## 2015-07-19 ENCOUNTER — Ambulatory Visit: Payer: Medicaid Other | Admitting: Gastroenterology

## 2015-07-26 ENCOUNTER — Ambulatory Visit: Payer: Medicaid Other

## 2015-08-02 ENCOUNTER — Ambulatory Visit (INDEPENDENT_AMBULATORY_CARE_PROVIDER_SITE_OTHER): Payer: Medicaid Other | Admitting: Gastroenterology

## 2015-08-02 ENCOUNTER — Other Ambulatory Visit: Payer: Self-pay

## 2015-08-02 ENCOUNTER — Encounter: Payer: Self-pay | Admitting: Gastroenterology

## 2015-08-02 VITALS — BP 128/89 | HR 77 | Temp 97.6°F | Ht 66.0 in | Wt 198.0 lb

## 2015-08-02 DIAGNOSIS — K7460 Unspecified cirrhosis of liver: Secondary | ICD-10-CM

## 2015-08-02 DIAGNOSIS — B1920 Unspecified viral hepatitis C without hepatic coma: Secondary | ICD-10-CM | POA: Insufficient documentation

## 2015-08-02 NOTE — Progress Notes (Signed)
Primary Care Physician:  Kenn File, MD Referring Physician: Dr. Scharlene Gloss Primary Gastroenterologist:  Barney Drain, MD   Chief Complaint  Patient presents with  . Cirrhosis    HPI:  Philip [name] is a 60 y.o. male here to establish care for history of cirrhosis at the request of Dr. Scharlene Gloss with infectious disease. Patient recently completed Harvoni first week of February for hepatitis C, genotype 1A, treatment naive. HCV RNA undetectable one month into treatment as well as at completion. He goes back in June to document sustained viral response. He is in the process of receiving hepatitis B vaccination, goes for his second shot tomorrow. Dr. Linus Salmons has referred patient to [place] for consideration of EGD for variceal screening. He will also be due for abdominal ultrasound for [facility] screen next month. Patient like to have this done locally. Patient had ultrasound with elastography in October 2016 with Metavir score of F3/F4. Previously seen for colonoscopy only by Dr. Delfin Edis last year with findings as outlined under past surgical history.  Clinically patient feels well. Denies abdominal pain, anorexia, nausea or vomiting, heartburn, constipation, diarrhea, melena, rectal bleeding. He feels well. No unintentional weight loss. Currently consuming alcohol less than once per week.    Current Outpatient Prescriptions  Medication Sig Dispense Refill  . amLODipine (NORVASC) 10 MG tablet Take 1 tablet (10 mg total) by mouth daily. 30 tablet 5  . aspirin EC 81 MG tablet Take 81 mg by mouth daily.    . fish oil-omega-3 fatty acids 1000 MG capsule Take 2 g by mouth daily.    Marland Kitchen gabapentin (NEURONTIN) 300 MG capsule Take 1 capsule (300 mg total) by mouth at bedtime. 30 capsule 5  . glipiZIDE (GLUCOTROL) 5 MG tablet TAKE ONE TABLET BY MOUTH ONCE DAILY BEFORE BREAKFAST 30 tablet 5  . hydrALAZINE (APRESOLINE) 25 MG tablet Take 1 tablet (25 mg total) by mouth 3 (three) times daily. 90 tablet 2  .  metoprolol succinate (TOPROL-XL) 50 MG 24 hr tablet Take 1 tablet (50 mg total) by mouth daily. Take with or immediately following a meal. 30 tablet 5  . topiramate (TOPAMAX) 25 MG tablet Take 1 tablet (25 mg total) by mouth 2 (two) times daily. 60 tablet 5   No current facility-administered medications for this visit.    Allergies as of 08/02/2015  . (No Known Allergies)    Past Medical History  Diagnosis Date  . Hypertension     takes Hydralazine,Maxzide,Clonidine,and Amlodipine daily  . Hypertension     also takes Metoprolol daily  . Nausea     takes Zofran daily  . History of migraine     takes Topamax daily  . Stroke ([place])     .  Short term memory loss.  Marland Kitchen Headache(784.0)     "Headache, I dont think they are migranes"  . Tobacco user   . Erectile dysfunction   . Sleep apnea     CPAP- tries to do it everyday.  . Seizures ([place])     off Dilantin daily.  last one was when he had a stroke.  . Diabetes mellitus without complication (HCC)     borderline  . Short-term memory loss     due to stroke   . Hepatitis C     s/p Harvoni, completed 06/2015 by Dr. Linus Salmons    Past Surgical History  Procedure Laterality Date  . Cholecystectomy N/A 03/09/2013    Procedure: LAPAROSCOPIC CHOLECYSTECTOMY WITH INTRAOPERATIVE CHOLANGIOGRAM;  Surgeon: Imogene Burn.  Georgette Dover, MD;  Location: [facility];  Service: General;  Laterality: N/A;  . Umbilical hernia repair N/A 03/09/2013    Procedure: HERNIA REPAIR UMBILICAL ADULT;  Surgeon: Imogene Burn. Georgette Dover, MD;  Location: [facility];  Service: General;  Laterality: N/A;  . Colonoscopy  06/2014    Dr. Olevia Perches: Three sessile polyps were found in the rectum x2 and descending. tubular adenomas. colonoscopy in 06/2019    Family History  Problem Relation Age of Onset  . Heart disease Maternal Grandmother   . Heart disease Maternal Grandfather   . Heart disease Paternal Grandmother   . Heart disease Paternal Grandfather   . Cancer Cousin   . Hypertension Mother   .  Colon cancer Maternal Uncle   . Rectal cancer Neg Hx   . Stomach cancer Neg Hx     Social History   Social History  . Marital Status: Single    Spouse Name: N/A  . Number of Children: N/A  . Years of Education: N/A   Occupational History  . Not on file.   Social History Main Topics  . Smoking status: Current Every Day Smoker -- 0.20 packs/day for 25 years    Types: Cigarettes    Start date: 05/26/1978  . Smokeless tobacco: Never Used  . Alcohol Use: 2.4 oz/week    3 Cans of beer, 1 Standard drinks or equivalent per week     Comment: OCCASSIONAL  . Drug Use: No  . Sexual Activity: Not on file   Other Topics Concern  . Not on file   Social History Narrative      ROS:  General: Negative for anorexia, weight loss, fever, chills, fatigue, weakness. Eyes: Negative for vision changes.  ENT: Negative for hoarseness, difficulty swallowing , nasal congestion. CV: Negative for chest pain, angina, palpitations, dyspnea on exertion, peripheral edema.  Respiratory: Negative for dyspnea at rest, dyspnea on exertion, cough, sputum, wheezing.  GI: See history of present illness. GU:  Negative for dysuria, hematuria, urinary incontinence, urinary frequency, nocturnal urination.  MS: Negative for joint pain, low back pain.  Derm: Negative for rash or itching.  Neuro: Negative for weakness, abnormal sensation, seizure, frequent headaches, memory loss, confusion.  Psych: Negative for anxiety, depression, suicidal ideation, hallucinations.  Endo: Negative for unusual weight change.  Heme: Negative for bruising or bleeding. Allergy: Negative for rash or hives.    Physical Examination:  BP 128/89 mmHg  Pulse 77  Temp(Src) 97.6 F (36.4 C) (Oral)  Ht 5\' 6"  (1.676 m)  Wt 198 lb (89.812 kg)  BMI 31.97 kg/m2   General: Well-nourished, well-developed in no acute distress.  Head: Normocephalic, atraumatic.   Eyes: Conjunctiva pink, no icterus. Mouth: Oropharyngeal mucosa moist and  pink , no lesions erythema or exudate. Neck: Supple without thyromegaly, masses, or lymphadenopathy.  Lungs: Clear to auscultation bilaterally.  Heart: Regular rate and rhythm, no murmurs rubs or gallops.  Abdomen: Bowel sounds are normal, nontender, nondistended, no hepatosplenomegaly or masses, no abdominal bruits or    hernia , no rebound or guarding.   Rectal: not performed Extremities: No lower extremity edema. No clubbing or deformities.  Neuro: Alert and oriented x 4 , grossly normal neurologically.  Skin: Warm and dry, no rash or jaundice.   Psych: Alert and cooperative, normal mood and affect.  Labs: Lab Results  Component Value Date   CREATININE 1.23 07/04/2015   BUN 11 07/04/2015   NA 142 07/04/2015   K 3.8 07/04/2015   CL 102 07/04/2015  CO2 22 07/04/2015   Lab Results  Component Value Date   ALT 22 07/04/2015   AST 22 07/04/2015   ALKPHOS 91 07/04/2015   BILITOT 0.5 07/04/2015   Lab Results  Component Value Date   WBC 3.8* 12/29/2014   HGB 17.0 12/29/2014   HCT 48.5 12/29/2014   MCV 89.0 12/29/2014   PLT 151 12/29/2014   HCV RNA negative 06/28/15  Imaging Studies: No results found.

## 2015-08-02 NOTE — Assessment & Plan Note (Signed)
HCV, genotype 1A, status post 12 weeks of Harvoni. HCV RNA negative at one month and at completion of therapy. In the process of hepatitis B vaccination, goes for second shot tomorrow. Due for abdominal ultrasound for [place] screening next month, we will arrange. She consider EGD for variceal screening given Metavir F3/F4 score. Plan for deep sedation in the OR given remote history of alcohol abuse/drug use. Patient did well with deep sedation for colonoscopy.  I have discussed the risks, alternatives, benefits with regards to but not limited to the risk of reaction to medication, bleeding, infection, perforation and the patient is agreeable to proceed. Written consent to be obtained.

## 2015-08-02 NOTE — Patient Instructions (Signed)
1. Upper endoscopy with Dr. Oneida Alar to screen for varices (blood vessels) as discussed. See separate instructions. 2. Abdominal ultrasound to look at your liver again in April 2017. 3. Continue to follow-up with Dr. Linus Salmons to complete hepatitis B vaccinations as scheduled and for office visit in June for your next hepatitis C labs.

## 2015-08-03 ENCOUNTER — Ambulatory Visit (INDEPENDENT_AMBULATORY_CARE_PROVIDER_SITE_OTHER): Payer: Medicaid Other | Admitting: *Deleted

## 2015-08-03 DIAGNOSIS — B20 Human immunodeficiency virus [HIV] disease: Secondary | ICD-10-CM | POA: Diagnosis not present

## 2015-08-03 DIAGNOSIS — Z23 Encounter for immunization: Secondary | ICD-10-CM

## 2015-08-06 NOTE — Progress Notes (Signed)
cc'ed to pcp °

## 2015-08-14 ENCOUNTER — Telehealth: Payer: Self-pay | Admitting: Gastroenterology

## 2015-08-14 NOTE — Telephone Encounter (Signed)
RECALL FOR ABD ULTRASOUND 08/2015

## 2015-08-14 NOTE — Telephone Encounter (Signed)
Mailed letter °

## 2015-08-16 ENCOUNTER — Encounter (HOSPITAL_COMMUNITY): Admission: RE | Admit: 2015-08-16 | Payer: Medicaid Other | Source: Ambulatory Visit

## 2015-08-16 ENCOUNTER — Telehealth: Payer: Self-pay

## 2015-08-16 ENCOUNTER — Other Ambulatory Visit (HOSPITAL_COMMUNITY): Payer: Medicaid Other

## 2015-08-16 NOTE — Telephone Encounter (Signed)
LMOM about time for EGD

## 2015-08-17 NOTE — Patient Instructions (Signed)
Philip [name]  08/17/2015     @PREFPERIOPPHARMACY @   Your procedure is scheduled on  08/21/2015   Report to [name] at  26  A.M.  Call this number if you have problems the morning of surgery:  [phone]   Remember:  Do not eat food or drink liquids after midnight.  Take these medicines the morning of surgery with A SIP OF WATER  Amlodipine, neirontin, metoprolol, topamax   Do not wear jewelry, make-up or nail polish.  Do not wear lotions, powders, or perfumes.  You may wear deodorant.  Do not shave 48 hours prior to surgery.  Men may shave face and neck.  Do not bring valuables to the hospital.  [facility] is not responsible for any belongings or valuables.  Contacts, dentures or bridgework may not be worn into surgery.  Leave your suitcase in the car.  After surgery it may be brought to your room.  For patients admitted to the hospital, discharge time will be determined by your treatment team.  Patients discharged the day of surgery will not be allowed to drive home.   Name and phone number of your driver:   family Special instructions:  Follow the diet instructions given to you by Dr Nona Dell office.  Please read over the following fact sheets that you were given. Coughing and Deep Breathing, Surgical Site Infection Prevention, Anesthesia Post-op Instructions and Care and Recovery After Surgery      Esophagogastroduodenoscopy Esophagogastroduodenoscopy (EGD) is a procedure that is used to examine the lining of the esophagus, stomach, and first part of the small intestine (duodenum). A long, flexible, lighted tube with a camera attached (endoscope) is inserted down the throat to view these organs. This procedure is done to detect problems or abnormalities, such as inflammation, bleeding, ulcers, or growths, in order to treat them. The procedure lasts 5-20 minutes. It is usually an outpatient procedure, but it may need to be performed in a hospital in emergency  cases. LET [facility] CARE PROVIDER KNOW ABOUT:  Any allergies you have.  All medicines you are taking, including vitamins, herbs, eye drops, creams, and over-the-counter medicines.  Previous problems you or members of your family have had with the use of anesthetics.  Any blood disorders you have.  Previous surgeries you have had.  Medical conditions you have. RISKS AND COMPLICATIONS Generally, this is a safe procedure. However, problems can occur and include:  Infection.  Bleeding.  Tearing (perforation) of the esophagus, stomach, or duodenum.  Difficulty breathing or not being able to breathe.  Excessive sweating.  Spasms of the larynx.  Slowed heartbeat.  Low blood pressure. BEFORE THE PROCEDURE  Do not eat or drink anything after midnight on the night before the procedure or as directed by your health care provider.  Do not take your regular medicines before the procedure if your health care provider asks you not to. Ask your health care provider about changing or stopping those medicines.  If you wear dentures, be prepared to remove them before the procedure.  Arrange for someone to drive you home after the procedure. PROCEDURE  A numbing medicine (local anesthetic) may be sprayed in your throat for comfort and to stop you from gagging or coughing.  You will have an IV tube inserted in a vein in your hand or arm. You will receive medicines and fluids through this tube.  You will be given a medicine to relax you (sedative).  A pain reliever will be given through the IV tube.  A mouth guard may be placed in your mouth to protect your teeth and to keep you from biting on the endoscope.  You will be asked to lie on your left side.  The endoscope will be inserted down your throat and into your esophagus, stomach, and duodenum.  Air will be put through the endoscope to allow your health care provider to clearly view the lining of your esophagus.  The lining  of your esophagus, stomach, and duodenum will be examined. During the exam, your health care provider may:  Remove tissue to be examined under a microscope (biopsy) for inflammation, infection, or other medical problems.  Remove growths.  Remove objects (foreign bodies) that are stuck.  Treat any bleeding with medicines or other devices that stop tissues from bleeding (hot cautery, clipping devices).  Widen (dilate) or stretch narrowed areas of your esophagus and stomach.  The endoscope will be withdrawn. AFTER THE PROCEDURE  You will be taken to a recovery area for observation. Your blood pressure, heart rate, breathing rate, and blood oxygen level will be monitored often until the medicines you were given have worn off.  Do not eat or drink anything until the numbing medicine has worn off and your gag reflex has returned. You may choke.  Your health care provider should be able to discuss his or her findings with you. It will take longer to discuss the test results if any biopsies were taken.   This information is not intended to replace advice given to you by your health care provider. Make sure you discuss any questions you have with your health care provider.   Document Released: 09/12/2004 Document Revised: 06/02/2014 Document Reviewed: 04/14/2012 Elsevier Interactive Patient Education 2016 [place]. Esophagogastroduodenoscopy, Care After Refer to this sheet in the next few weeks. These instructions provide you with information about caring for yourself after your procedure. Your health care provider may also give you more specific instructions. Your treatment has been planned according to current medical practices, but problems sometimes occur. Call your health care provider if you have any problems or questions after your procedure. WHAT TO EXPECT AFTER THE PROCEDURE After your procedure, it is typical to feel:  Soreness in your throat.  Pain with swallowing.  Sick to  your stomach (nauseous).  Bloated.  Dizzy.  Fatigued. HOME CARE INSTRUCTIONS  Do not eat or drink anything until the numbing medicine (local anesthetic) has worn off and your gag reflex has returned. You will know that the local anesthetic has worn off when you can swallow comfortably.  Do not drive or operate machinery until directed by your health care provider.  Take medicines only as directed by your health care provider. SEEK MEDICAL CARE IF:   You cannot stop coughing.  You are not urinating at all or less than usual. SEEK IMMEDIATE MEDICAL CARE IF:  You have difficulty swallowing.  You cannot eat or drink.  You have worsening throat or chest pain.  You have dizziness or lightheadedness or you faint.  You have nausea or vomiting.  You have chills.  You have a fever.  You have severe abdominal pain.  You have black, tarry, or bloody stools.   This information is not intended to replace advice given to you by your health care provider. Make sure you discuss any questions you have with your health care provider.   Document Released: 04/28/2012 Document Revised: 06/02/2014 Document Reviewed: 04/28/2012 Elsevier Interactive  Patient Education 2016 [employer] PATIENT INSTRUCTIONS POST-ANESTHESIA  IMMEDIATELY FOLLOWING SURGERY:  Do not drive or operate machinery for the first twenty four hours after surgery.  Do not make any important decisions for twenty four hours after surgery or while taking narcotic pain medications or sedatives.  If you develop intractable nausea and vomiting or a severe headache please notify your doctor immediately.  FOLLOW-UP:  Please make an appointment with your surgeon as instructed. You do not need to follow up with anesthesia unless specifically instructed to do so.  WOUND CARE INSTRUCTIONS (if applicable):  Keep a dry clean dressing on the anesthesia/puncture wound site if there is drainage.  Once the wound has quit draining you may  leave it open to air.  Generally you should leave the bandage intact for twenty four hours unless there is drainage.  If the epidural site drains for more than 36-48 hours please call the anesthesia department.  QUESTIONS?:  Please feel free to call your physician or the hospital operator if you have any questions, and they will be happy to assist you.

## 2015-08-20 ENCOUNTER — Encounter (HOSPITAL_COMMUNITY)
Admission: RE | Admit: 2015-08-20 | Discharge: 2015-08-20 | Disposition: A | Discharge status: Home / Self Care | Payer: Medicaid Other | Source: Ambulatory Visit | Attending: Gastroenterology | Admitting: Gastroenterology

## 2015-08-20 ENCOUNTER — Encounter (HOSPITAL_COMMUNITY): Payer: Self-pay

## 2015-08-20 DIAGNOSIS — Z01810 Encounter for preprocedural cardiovascular examination: Secondary | ICD-10-CM | POA: Diagnosis not present

## 2015-08-20 DIAGNOSIS — Z01812 Encounter for preprocedural laboratory examination: Secondary | ICD-10-CM | POA: Diagnosis not present

## 2015-08-20 LAB — BASIC METABOLIC PANEL
ANION GAP: 9 (ref 5–15)
BUN: 10 mg/dL (ref 6–20)
CALCIUM: 9.2 mg/dL (ref 8.9–10.3)
CO2: 22 mmol/L (ref 22–32)
Chloride: 107 mmol/L (ref 101–111)
Creatinine, Ser: 1.25 mg/dL — ABNORMAL HIGH (ref 0.61–1.24)
GLUCOSE: 97 mg/dL (ref 65–99)
POTASSIUM: 3.8 mmol/L (ref 3.5–5.1)
Sodium: 138 mmol/L (ref 135–145)

## 2015-08-20 LAB — CBC WITH DIFFERENTIAL/PLATELET
BASOS ABS: 0 10*3/uL (ref 0.0–0.1)
BASOS PCT: 0 %
Eosinophils Absolute: 0.7 10*3/uL (ref 0.0–0.7)
Eosinophils Relative: 8 %
HEMATOCRIT: 45.5 % (ref 39.0–52.0)
Hemoglobin: 15.9 g/dL (ref 13.0–17.0)
LYMPHS PCT: 33 %
Lymphs Abs: 2.6 10*3/uL (ref 0.7–4.0)
MCH: 31.7 pg (ref 26.0–34.0)
MCHC: 34.9 g/dL (ref 30.0–36.0)
MCV: 90.8 fL (ref 78.0–100.0)
Monocytes Absolute: 1.1 10*3/uL — ABNORMAL HIGH (ref 0.1–1.0)
Monocytes Relative: 14 %
NEUTROS ABS: 3.6 10*3/uL (ref 1.7–7.7)
NEUTROS PCT: 45 %
PLATELETS: 144 10*3/uL — AB (ref 150–400)
RBC: 5.01 MIL/uL (ref 4.22–5.81)
RDW: 13.6 % (ref 11.5–15.5)
WBC: 7.9 10*3/uL (ref 4.0–10.5)

## 2015-08-20 NOTE — Telephone Encounter (Signed)
PT IS AWARE THAT THE TIME HAS CHANGED FOR HIS EGD

## 2015-08-21 ENCOUNTER — Ambulatory Visit (HOSPITAL_COMMUNITY): Payer: Medicaid Other | Admitting: Anesthesiology

## 2015-08-21 ENCOUNTER — Encounter (HOSPITAL_COMMUNITY)
Admission: RE | Disposition: A | Discharge status: Home / Self Care | Payer: Self-pay | Source: Ambulatory Visit | Attending: Gastroenterology

## 2015-08-21 ENCOUNTER — Encounter (HOSPITAL_COMMUNITY): Payer: Self-pay | Admitting: *Deleted

## 2015-08-21 ENCOUNTER — Ambulatory Visit (HOSPITAL_COMMUNITY)
Admission: RE | Admit: 2015-08-21 | Discharge: 2015-08-21 | Disposition: A | Discharge status: Home / Self Care | Payer: Medicaid Other | Source: Ambulatory Visit | Attending: Gastroenterology | Admitting: Gastroenterology

## 2015-08-21 DIAGNOSIS — Z13810 Encounter for screening for upper gastrointestinal disorder: Secondary | ICD-10-CM | POA: Diagnosis present

## 2015-08-21 DIAGNOSIS — K259 Gastric ulcer, unspecified as acute or chronic, without hemorrhage or perforation: Secondary | ICD-10-CM

## 2015-08-21 DIAGNOSIS — K2970 Gastritis, unspecified, without bleeding: Secondary | ICD-10-CM | POA: Diagnosis not present

## 2015-08-21 DIAGNOSIS — E119 Type 2 diabetes mellitus without complications: Secondary | ICD-10-CM | POA: Diagnosis not present

## 2015-08-21 DIAGNOSIS — I69311 Memory deficit following cerebral infarction: Secondary | ICD-10-CM | POA: Diagnosis not present

## 2015-08-21 DIAGNOSIS — I8510 Secondary esophageal varices without bleeding: Secondary | ICD-10-CM | POA: Diagnosis not present

## 2015-08-21 DIAGNOSIS — I10 Essential (primary) hypertension: Secondary | ICD-10-CM | POA: Insufficient documentation

## 2015-08-21 DIAGNOSIS — F17210 Nicotine dependence, cigarettes, uncomplicated: Secondary | ICD-10-CM | POA: Diagnosis not present

## 2015-08-21 DIAGNOSIS — B1920 Unspecified viral hepatitis C without hepatic coma: Secondary | ICD-10-CM | POA: Diagnosis not present

## 2015-08-21 DIAGNOSIS — K2950 Unspecified chronic gastritis without bleeding: Secondary | ICD-10-CM | POA: Diagnosis not present

## 2015-08-21 DIAGNOSIS — B9681 Helicobacter pylori [H. pylori] as the cause of diseases classified elsewhere: Secondary | ICD-10-CM | POA: Diagnosis not present

## 2015-08-21 DIAGNOSIS — G4730 Sleep apnea, unspecified: Secondary | ICD-10-CM | POA: Diagnosis not present

## 2015-08-21 DIAGNOSIS — Z7982 Long term (current) use of aspirin: Secondary | ICD-10-CM | POA: Diagnosis not present

## 2015-08-21 DIAGNOSIS — I8500 Esophageal varices without bleeding: Secondary | ICD-10-CM

## 2015-08-21 HISTORY — PX: ESOPHAGOGASTRODUODENOSCOPY (EGD) WITH PROPOFOL: SHX5813

## 2015-08-21 LAB — GLUCOSE, CAPILLARY: GLUCOSE-CAPILLARY: 133 mg/dL — AB (ref 65–99)

## 2015-08-21 SURGERY — ESOPHAGOGASTRODUODENOSCOPY (EGD) WITH PROPOFOL
Anesthesia: Monitor Anesthesia Care

## 2015-08-21 MED ORDER — LACTATED RINGERS IV SOLN
INTRAVENOUS | Status: DC
  Administered 2015-08-21: 09:00:00 via INTRAVENOUS

## 2015-08-21 MED ORDER — PROPOFOL 500 MG/50ML IV EMUL
INTRAVENOUS | Status: DC | PRN
  Administered 2015-08-21: 75 ug/kg/min via INTRAVENOUS

## 2015-08-21 MED ORDER — ONDANSETRON HCL 4 MG/2ML IJ SOLN: 4.0000 mg | Freq: Once | INTRAMUSCULAR | Status: DC | PRN

## 2015-08-21 MED ORDER — GLYCOPYRROLATE 0.2 MG/ML IJ SOLN
INTRAMUSCULAR | Status: AC
  Filled 2015-08-21: qty 1

## 2015-08-21 MED ORDER — PROPOFOL 10 MG/ML IV BOLUS
INTRAVENOUS | Status: AC
  Filled 2015-08-21: qty 20

## 2015-08-21 MED ORDER — FENTANYL CITRATE (PF) 100 MCG/2ML IJ SOLN: 25.0000 ug | INTRAMUSCULAR | Status: DC | PRN

## 2015-08-21 MED ORDER — FENTANYL CITRATE (PF) 100 MCG/2ML IJ SOLN
25.0000 ug | INTRAMUSCULAR | Status: AC
  Administered 2015-08-21 (×2): 25 ug via INTRAVENOUS

## 2015-08-21 MED ORDER — MIDAZOLAM HCL 2 MG/2ML IJ SOLN
1.0000 mg | INTRAMUSCULAR | Status: DC | PRN
  Administered 2015-08-21: 2 mg via INTRAVENOUS
  Filled 2015-08-21: qty 2

## 2015-08-21 MED ORDER — ONDANSETRON HCL 4 MG/2ML IJ SOLN
INTRAMUSCULAR | Status: AC
  Filled 2015-08-21: qty 2

## 2015-08-21 MED ORDER — LIDOCAINE VISCOUS 2 % MT SOLN
15.0000 mL | Freq: Once | OROMUCOSAL | Status: AC
  Administered 2015-08-21: 15 mL via OROMUCOSAL

## 2015-08-21 MED ORDER — FENTANYL CITRATE (PF) 100 MCG/2ML IJ SOLN
INTRAMUSCULAR | Status: AC
  Filled 2015-08-21: qty 2

## 2015-08-21 MED ORDER — MIDAZOLAM HCL 2 MG/2ML IJ SOLN
INTRAMUSCULAR | Status: AC
  Filled 2015-08-21: qty 2

## 2015-08-21 MED ORDER — GLYCOPYRROLATE 0.2 MG/ML IJ SOLN
0.2000 mg | Freq: Once | INTRAMUSCULAR | Status: AC
  Administered 2015-08-21: 0.2 mg via INTRAVENOUS

## 2015-08-21 MED ORDER — ESOMEPRAZOLE MAGNESIUM 40 MG PO CPDR: DELAYED_RELEASE_CAPSULE | ORAL | Status: DC

## 2015-08-21 MED ORDER — LIDOCAINE VISCOUS 2 % MT SOLN
OROMUCOSAL | Status: AC
  Filled 2015-08-21: qty 15

## 2015-08-21 MED ORDER — MIDAZOLAM HCL 5 MG/5ML IJ SOLN
INTRAMUSCULAR | Status: DC | PRN
  Administered 2015-08-21: 2 mg via INTRAVENOUS

## 2015-08-21 NOTE — Progress Notes (Signed)
REVIEWED-NO ADDITIONAL RECOMMENDATIONS. 

## 2015-08-21 NOTE — Anesthesia Preprocedure Evaluation (Signed)
Anesthesia Evaluation  Patient identified by MRN, date of birth, ID band Patient awake    Reviewed: Allergy & Precautions, H&P , NPO status , Patient's Chart, lab work & pertinent test results  Airway Mallampati: II  TM Distance: >3 FB Neck ROM: Full    Dental  (+) Poor Dentition   Pulmonary asthma , sleep apnea , COPD, Current Smoker,    + rhonchi        Cardiovascular hypertension, Pt. on medications  Rhythm:Regular Rate:Normal     Neuro/Psych  Headaches, Seizures -,  Short-term memory loss CVA    GI/Hepatic (+) Hepatitis -, C  Endo/Other  diabetes, Type 2  Renal/GU      Musculoskeletal   Abdominal   Peds  Hematology   Anesthesia Other Findings   Reproductive/Obstetrics                             Anesthesia Physical Anesthesia Plan  ASA: III  Anesthesia Plan: MAC   Post-op Pain Management:    Induction: Intravenous  Airway Management Planned: Simple Face Mask  Additional Equipment:   Intra-op Plan:   Post-operative Plan:   Informed Consent: I have reviewed the patients History and Physical, chart, labs and discussed the procedure including the risks, benefits and alternatives for the proposed anesthesia with the patient or authorized representative who has indicated his/her understanding and acceptance.     Plan Discussed with:   Anesthesia Plan Comments:         Anesthesia Quick Evaluation

## 2015-08-21 NOTE — H&P (Signed)
Primary Care Physician:  Kenn File, MD Primary Gastroenterologist:  Dr. Oneida Alar  Pre-Procedure History & Physical: HPI:  Philip [name] is a 60 y.o. male here for [facility].   Past Medical History  Diagnosis Date  . Hypertension     takes Hydralazine,Maxzide,Clonidine,and Amlodipine daily  . Hypertension     also takes Metoprolol daily  . Nausea     takes Zofran daily  . History of migraine     takes Topamax daily  . Stroke ([place])     .  Short term memory loss.  Marland Kitchen Headache(784.0)     "Headache, I dont think they are migranes"  . Tobacco user   . Erectile dysfunction   . Sleep apnea     CPAP- tries to do it everyday.  . Seizures ([place])     off Dilantin daily.  last one was when he had a stroke.  . Diabetes mellitus without complication (HCC)     borderline  . Short-term memory loss     due to stroke   . Hepatitis C     s/p Harvoni, completed 06/2015 by Dr. Linus Salmons    Past Surgical History  Procedure Laterality Date  . Cholecystectomy N/A 03/09/2013    Procedure: LAPAROSCOPIC CHOLECYSTECTOMY WITH INTRAOPERATIVE CHOLANGIOGRAM;  Surgeon: Imogene Burn. Georgette Dover, MD;  Location: [facility];  Service: General;  Laterality: N/A;  . Umbilical hernia repair N/A 03/09/2013    Procedure: HERNIA REPAIR UMBILICAL ADULT;  Surgeon: Imogene Burn. Georgette Dover, MD;  Location: [facility];  Service: General;  Laterality: N/A;  . Colonoscopy  06/2014    Dr. Olevia Perches: Three sessile polyps were found in the rectum x2 and descending. tubular adenomas. colonoscopy in 06/2019    Prior to Admission medications   Medication Sig Start Date End Date Taking? Authorizing Provider  amLODipine (NORVASC) 10 MG tablet Take 1 tablet (10 mg total) by mouth daily. 06/19/15  Yes Wardell Honour, MD  aspirin EC 81 MG tablet Take 81 mg by mouth daily.   Yes Historical Provider, MD  fish oil-omega-3 fatty acids 1000 MG capsule Take 2 g by mouth daily.   Yes Historical Provider, MD  gabapentin (NEURONTIN) 300 MG capsule Take 1  capsule (300 mg total) by mouth at bedtime. 06/19/15  Yes Wardell Honour, MD  glipiZIDE (GLUCOTROL) 5 MG tablet TAKE ONE TABLET BY MOUTH ONCE DAILY BEFORE BREAKFAST 06/19/15  Yes Wardell Honour, MD  hydrALAZINE (APRESOLINE) 25 MG tablet Take 1 tablet (25 mg total) by mouth 3 (three) times daily. 06/19/15  Yes Wardell Honour, MD  metoprolol succinate (TOPROL-XL) 50 MG 24 hr tablet Take 1 tablet (50 mg total) by mouth daily. Take with or immediately following a meal. 06/19/15  Yes Wardell Honour, MD  topiramate (TOPAMAX) 25 MG tablet Take 1 tablet (25 mg total) by mouth 2 (two) times daily. 06/19/15  Yes Wardell Honour, MD    Allergies as of 08/02/2015  . (No Known Allergies)    Family History  Problem Relation Age of Onset  . Heart disease Maternal Grandmother   . Heart disease Maternal Grandfather   . Heart disease Paternal Grandmother   . Heart disease Paternal Grandfather   . Cancer Cousin   . Hypertension Mother   . Colon cancer Maternal Uncle   . Rectal cancer Neg Hx   . Stomach cancer Neg Hx     Social History   Social History  . Marital Status: Single    Spouse Name: N/A  .  Number of Children: N/A  . Years of Education: N/A   Occupational History  . Not on file.   Social History Main Topics  . Smoking status: Current Every Day Smoker -- 0.20 packs/day for 25 years    Types: Cigarettes    Start date: 05/26/1978  . Smokeless tobacco: Never Used  . Alcohol Use: 1.8 oz/week    1 Standard drinks or equivalent, 2 Cans of beer per week     Comment: OCCASSIONAL  . Drug Use: No  . Sexual Activity: Not on file   Other Topics Concern  . Not on file   Social History Narrative    Review of Systems: See HPI, otherwise negative ROS   Physical Exam: BP 116/84 mmHg  Temp(Src) 98.7 F (37.1 C) (Oral)  Resp 13  SpO2 98% General:   Alert,  pleasant and cooperative in NAD Head:  Normocephalic and atraumatic. Neck:  Supple; Lungs:  Clear throughout to  auscultation.    Heart:  Regular rate and rhythm. Abdomen:  Soft, nontender and nondistended. Normal bowel sounds, without guarding, and without rebound.   Neurologic:  Alert and  oriented x4;  grossly normal neurologically.  Impression/Plan:     SCREENING VARICES  PLAN:  1.EGD TODAY

## 2015-08-21 NOTE — Transfer of Care (Signed)
Immediate Anesthesia Transfer of Care Note  Patient: Philip [name]  Procedure(s) Performed: Procedure(s) with comments: ESOPHAGOGASTRODUODENOSCOPY (EGD) WITH PROPOFOL (N/A) - moved to 945 per Ginger  Patient Location: PACU  Anesthesia Type:MAC  Level of Consciousness: awake and patient cooperative  Airway & Oxygen Therapy: Patient Spontanous Breathing and Patient connected to face mask oxygen  Post-op Assessment: Report given to RN, Post -op Vital signs reviewed and stable and Patient moving all extremities  Post vital signs: Reviewed and stable  Last Vitals:  Filed Vitals:   08/21/15 1005 08/21/15 1010  BP: 124/88   Temp:    Resp: 15 26    Complications: No apparent anesthesia complications

## 2015-08-21 NOTE — Discharge Instructions (Signed)
YOU HAVE small ulcers in your stomach most likely due to aspirin use. I BIOPSIED YOUR STOMACH and small bowel. You have an esophageal stricture, WHICH CAN CAUSE PROBLEMS SWALLOWING SOLID FOOD. YOU HAVE a moderate size hiatal hernia.   CONTINUE YOUR WEIGHT LOSS EFFORTS. Lose 10 pounds.  FOLLOW A LOW FAT DIET. MEATS SHOULD BE BAKED, BROILED, OR BOILED. Avoid fried foods. SEE INFO BELOW.  Cut back to 1 beer a day.     START NEXIUM.  TAKE 30 MINUTES PRIOR TO YOUR MEALS TWICE DAILY FOR 3 MOS THEN ONCE DAILY.   PLEASE CALL IN IF YOU HAVE PROBLEMS SWALLOWING SOLID FOOD.  REPEAT UPPER ENDOSCOPY IN 3 mos to assess healing of ulcers and in 3 years to look for varices.  FOLLOW UP IN 6 MOS.       UPPER ENDOSCOPY AFTER CARE Read the instructions outlined below and refer to this sheet in the next week. These discharge instructions provide you with general information on caring for yourself after you leave the hospital. While your treatment has been planned according to the most current medical practices available, unavoidable complications occasionally occur. If you have any problems or questions after discharge, call DR. Syrah Daughtrey, [phone].  ACTIVITY  You may resume your regular activity, but move at a slower pace for the next 24 hours.   Take frequent rest periods for the next 24 hours.   Walking will help get rid of the air and reduce the bloated feeling in your belly (abdomen).   No driving for 24 hours (because of the medicine (anesthesia) used during the test).   You may shower.   Do not sign any important legal documents or operate any machinery for 24 hours (because of the anesthesia used during the test).    NUTRITION  Drink plenty of fluids.   You may resume your normal diet as instructed by your doctor.   Begin with a light meal and progress to your normal diet. Heavy or fried foods are harder to digest and may make you feel sick to your stomach (nauseated).   Avoid  alcoholic beverages for 24 hours or as instructed.    MEDICATIONS  You may resume your normal medications.   WHAT YOU CAN EXPECT TODAY  Some feelings of bloating in the abdomen.   Passage of more gas than usual.    IF YOU HAD A BIOPSY TAKEN DURING THE UPPER ENDOSCOPY:    Eat a soft diet IF YOU HAVE NAUSEA, BLOATING, ABDOMINAL PAIN, OR VOMITING.    FINDING OUT THE RESULTS OF YOUR TEST Not all test results are available during your visit. DR. Oneida Alar WILL CALL YOU WITHIN 14 DAYS OF YOUR PROCEDUE WITH YOUR RESULTS. Do not assume everything is normal if you have not heard from DR. Hollace Michelli, CALL HER OFFICE AT [phone].  SEEK IMMEDIATE MEDICAL ATTENTION AND CALL THE OFFICE: [phone] IF:  You have more than a spotting of blood in your stool.   Your belly is swollen (abdominal distention).   You are nauseated or vomiting.   You have a temperature over 101F.   You have abdominal pain or discomfort that is severe or gets worse throughout the day.   Ulcer Disease (Peptic Ulcer, Gastric Ulcer, Duodenal Ulcer) You have an ulcer. This may be in your stomach (gastric ulcer) or in the first part of your small bowel, the duodenum (duodenal ulcer). An ulcer is a break in the lining of the stomach or duodenum. The ulcer causes erosion into the  deeper tissue.  CAUSES The stomach has a lining to protect itself from the acid that digests food. The lining can be damaged in two main ways:  The Helico Pylori bacteria (H. Pyolori) can infect the lining of the stomach and cause ulcers.   Nonsteroidal, anti-inflammatory medications (NSAIDS) can cause gastric ulcerations.   Smoking tobacco can increase the acid in the stomach. This can lead to ulcers, and will impair healing of ulcers.   Other factors, such as alcohol use and stress may contribute to ulcer formation.  SYMPTOMS The problems (symptoms) of ulcer disease are usually a burning or gnawing of the mid-upper belly (abdomen).  This is often worse on an empty stomach and may get better with food. This may be associated with feeling sick to your stomach (nausea), bloating, and vomiting.  HOME CARE INSTRUCTIONS Continue regular work and usual activities unless advised otherwise by your caregiver.  Avoid tobacco, alcohol, and caffeine. Tobacco use will decrease and slow the rates of healing.   Avoid foods that seem to aggravate or cause discomfort.    Low-Fat Diet BREADS, CEREALS, PASTA, RICE, DRIED PEAS, AND BEANS These products are high in carbohydrates and most are low in fat. Therefore, they can be increased in the diet as substitutes for fatty foods. They too, however, contain calories and should not be eaten in excess. Cereals can be eaten for snacks as well as for breakfast.   FRUITS AND VEGETABLES It is good to eat fruits and vegetables. Besides being sources of fiber, both are rich in vitamins and some minerals. They help you get the daily allowances of these nutrients. Fruits and vegetables can be used for snacks and desserts.  MEATS Limit lean meat, chicken, [place], and fish to no more than 6 ounces per day. Beef, Pork, and Lamb Use lean cuts of beef, pork, and lamb. Lean cuts include:  Extra-lean ground beef.  Arm roast.  Sirloin tip.  Center-cut ham.  Round steak.  Loin chops.  Rump roast.  Tenderloin.  Trim all fat off the outside of meats before cooking. It is not necessary to severely decrease the intake of red meat, but lean choices should be made. Lean meat is rich in protein and contains a highly absorbable form of iron. Premenopausal women, in particular, should avoid reducing lean red meat because this could increase the risk for low red blood cells (iron-deficiency anemia).  Chicken and [place] These are good sources of protein. The fat of poultry can be reduced by removing the skin and underlying fat layers before cooking. Chicken and [place] can be substituted for lean red meat in the  diet. Poultry should not be fried or covered with high-fat sauces. Fish and Shellfish Fish is a good source of protein. Shellfish contain cholesterol, but they usually are low in saturated fatty acids. The preparation of fish is important. Like chicken and [place], they should not be fried or covered with high-fat sauces. EGGS Egg whites contain no fat or cholesterol. They can be eaten often. Try 1 to 2 egg whites instead of whole eggs in recipes or use egg substitutes that do not contain yolk. MILK AND DAIRY PRODUCTS Use skim or 1% milk instead of 2% or whole milk. Decrease whole milk, natural, and processed cheeses. Use nonfat or low-fat (2%) cottage cheese or low-fat cheeses made from vegetable oils. Choose nonfat or low-fat (1 to 2%) yogurt. Experiment with evaporated skim milk in recipes that call for heavy cream. Substitute low-fat yogurt or low-fat cottage  cheese for sour cream in dips and salad dressings. Have at least 2 servings of low-fat dairy products, such as 2 glasses of skim (or 1%) milk each day to help get your daily calcium intake. FATS AND OILS Reduce the total intake of fats, especially saturated fat. Butterfat, lard, and beef fats are high in saturated fat and cholesterol. These should be avoided as much as possible. Vegetable fats do not contain cholesterol, but certain vegetable fats, such as coconut oil, palm oil, and palm kernel oil are very high in saturated fats. These should be limited. These fats are often used in bakery goods, processed foods, popcorn, oils, and nondairy creamers. Vegetable shortenings and some peanut butters contain hydrogenated oils, which are also saturated fats. Read the labels on these foods and check for saturated vegetable oils. Unsaturated vegetable oils and fats do not raise blood cholesterol. However, they should be limited because they are fats and are high in calories. Total fat should still be limited to 30% of your daily caloric intake. Desirable  liquid vegetable oils are corn oil, cottonseed oil, olive oil, canola oil, safflower oil, soybean oil, and sunflower oil. Peanut oil is not as good, but small amounts are acceptable. Buy a heart-healthy tub margarine that has no partially hydrogenated oils in the ingredients. Mayonnaise and salad dressings often are made from unsaturated fats, but they should also be limited because of their high calorie and fat content. Seeds, nuts, peanut butter, olives, and avocados are high in fat, but the fat is mainly the unsaturated type. These foods should be limited mainly to avoid excess calories and fat. OTHER EATING TIPS Snacks  Most sweets should be limited as snacks. They tend to be rich in calories and fats, and their caloric content outweighs their nutritional value. Some good choices in snacks are graham crackers, melba toast, soda crackers, bagels (no egg), English muffins, fruits, and vegetables. These snacks are preferable to snack crackers, [place] fries, TORTILLA CHIPS, and POTATO chips. Popcorn should be air-popped or cooked in small amounts of liquid vegetable oil. Desserts Eat fruit, low-fat yogurt, and fruit ices instead of pastries, cake, and cookies. Sherbet, angel food cake, gelatin dessert, frozen low-fat yogurt, or other frozen products that do not contain saturated fat (pure fruit juice bars, frozen ice pops) are also acceptable.  COOKING METHODS Choose those methods that use little or no fat. They include: Poaching.  Braising.  Steaming.  Grilling.  Baking.  Stir-frying.  Broiling.  Microwaving.  Foods can be cooked in a nonstick pan without added fat, or use a nonfat cooking spray in regular cookware. Limit fried foods and avoid frying in saturated fat. Add moisture to lean meats by using water, broth, cooking wines, and other nonfat or low-fat sauces along with the cooking methods mentioned above. Soups and stews should be chilled after cooking. The fat that forms on top after a  few hours in the refrigerator should be skimmed off. When preparing meals, avoid using excess salt. Salt can contribute to raising blood pressure in some people.  EATING AWAY FROM HOME Order entres, potatoes, and vegetables without sauces or butter. When meat exceeds the size of a deck of cards (3 to 4 ounces), the rest can be taken home for another meal. Choose vegetable or fruit salads and ask for low-calorie salad dressings to be served on the side. Use dressings sparingly. Limit high-fat toppings, such as bacon, crumbled eggs, cheese, sunflower seeds, and olives. Ask for heart-healthy tub margarine instead of butter.

## 2015-08-21 NOTE — Op Note (Signed)
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 Wendi Snipes (Referring MD) Medicines:                Propofol per Anesthesia Complications:            No immediate complications. Estimated Blood Loss:     Estimated blood loss was minimal. Procedure:                Pre-Anesthesia Assessment:                           - Prior to the procedure, a History and Physical                            was performed, and patient medications and                            allergies were reviewed. The patient's tolerance of                            previous anesthesia was also reviewed. The risks                            and benefits of the procedure and the sedation                            options and risks were discussed with the patient.                            All questions were answered, and informed consent                            was obtained. Prior Anticoagulants: The patient has                            taken aspirin, last dose was day of procedure. ASA                            Grade Assessment: II - A patient with mild systemic  disease. After reviewing the risks and benefits,                            the patient was deemed in satisfactory condition to                            undergo the procedure.                           After obtaining informed consent, the endoscope was                            passed under direct vision. Throughout the   procedure, the patient's blood pressure, pulse, and                            oxygen saturations were monitored continuously. The                            [id number]) scope was introduced through the                            mouth, and advanced to the second part of duodenum.                            The upper GI endoscopy was accomplished without                            difficulty. The patient tolerated the procedure                            well. Scope In: 10:24:12 AM Scope Out: 10:30:51 AM Total Procedure Duration: 0 hours 6 minutes 39 seconds  Findings:      The examined esophagus was normal.      Four non-bleeding cratered gastric ulcers with no stigmata of bleeding       were found in the gastric antrum. Biopsies were taken with a cold       forceps for Helicobacter pylori testing.      Diffuse moderate inflammation characterized by congestion (edema) and       erythema was found in the gastric antrum. Biopsies were taken with a       cold forceps for Helicobacter pylori testing. Estimated blood loss was       minimal.      The duodenal bulb and second portion of the duodenum were normal. Impression:               - Normal esophagus.                           - Non-bleeding gastric ulcers with no stigmata of                            bleeding. Biopsied.                           - Gastritis. Biopsied.                           -  Normal duodenal bulb and second portion of the                            duodenum. Moderate Sedation:      Per Anesthesia Care Recommendation:           - Patient has a contact number available for                            emergencies. The signs and symptoms of potential                            delayed complications were discussed with the                            patient. Return to normal activities tomorrow.                            Written discharge instructions were provided to the                            patient.                            - Low fat diet. MEATS SHOULD BE BAKED, BROILED, OR                            BOILED. Avoid fried foods.                           - Use Nexium (esomeprazole) 40 mg PO BID. TAKE 30                            MINUTES PRIOR TO YOUR MEALS TWICE DAILY FOR 3 MOS                            THEN ONCE DAILY.                           - Await pathology results.                           - Repeat upper endoscopy in 3 months for                            surveillance of ulcers.                           - Repeat upper endoscopy in 3 years for                            surveillance of varices.                           - Return to my office in 6 months.  CONTINUE YOUR WEIGHT LOSS EFFORTS. Lose 10 pounds.                           Cut back to 1 beer a day.                           PLEASE CALL IN IF YOU HAVE PROBLEMS SWALLOWING                            SOLID FOOD.                           - Continue present medications. 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 All rights reserved. The codes documented in this report are preliminary and upon coder review may  be revised to meet current compliance requirements. Barney Drain, MD Barney Drain, MD 08/21/2015 11:38:27 AM This report has been signed electronically. Number of Addenda: 0

## 2015-08-21 NOTE — Anesthesia Postprocedure Evaluation (Signed)
Anesthesia Post Note  Patient: Philip [name]  Procedure(s) Performed: Procedure(s) (LRB): ESOPHAGOGASTRODUODENOSCOPY (EGD) WITH PROPOFOL (N/A)  Patient location during evaluation: PACU Anesthesia Type: MAC Level of consciousness: awake and patient cooperative Pain management: pain level controlled Vital Signs Assessment: post-procedure vital signs reviewed and stable Respiratory status: nonlabored ventilation, spontaneous breathing and patient connected to nasal cannula oxygen Cardiovascular status: stable Postop Assessment: no signs of nausea or vomiting Anesthetic complications: no    Last Vitals:  Filed Vitals:   08/21/15 1005 08/21/15 1010  BP: 124/88   Temp:    Resp: 15 26    Last Pain: There were no vitals filed for this visit.               Lonzie Simmer J

## 2015-08-24 ENCOUNTER — Encounter (HOSPITAL_COMMUNITY): Payer: Self-pay | Admitting: Gastroenterology

## 2015-08-26 ENCOUNTER — Other Ambulatory Visit: Payer: Self-pay | Admitting: Family Medicine

## 2015-08-31 ENCOUNTER — Telehealth: Payer: Self-pay | Admitting: Family Medicine

## 2015-08-31 NOTE — Telephone Encounter (Signed)
No answer, will attempt again.   Called to ask if he had been treated for H pylori.   Laroy Apple, MD [facility] 08/31/2015, 6:39 PM

## 2015-09-09 ENCOUNTER — Telehealth: Payer: Self-pay | Admitting: Gastroenterology

## 2015-09-09 MED ORDER — CLARITHROMYCIN 500 MG PO TABS: ORAL_TABLET | ORAL | Status: DC

## 2015-09-09 MED ORDER — AMOXICILLIN 500 MG PO TABS: ORAL_TABLET | ORAL | Status: DC

## 2015-09-09 NOTE — Telephone Encounter (Signed)
PLEASE CALL PT. HIS stomach Bx showed H. Pylori infection. He needs AMOXICILLIN 500 mg 2 po BID for 10 days and Biaxin 500 mg po bid for 10 days. CONTINUE NEXUM BID. Med side effects include NVD, abd pain, and metallic taste.    CONTINUE YOUR WEIGHT LOSS EFFORTS. Lose 10 pounds.  FOLLOW A LOW FAT DIET. MEATS SHOULD BE BAKED, BROILED, OR BOILED. Avoid fried foods.   Cut back to 1 beer a day.     PLEASE CALL IN IF YOU HAVE PROBLEMS SWALLOWING SOLID FOOD.  REPEAT UPPER ENDOSCOPY IN 3 mos to assess healing of ulcers and in 3 years to look for varices.  FOLLOW UP IN 6 MOS E30 PUD/CIRRHOSIS.

## 2015-09-10 NOTE — Telephone Encounter (Signed)
APPT MADE AND ON RECALL  °

## 2015-09-12 NOTE — Telephone Encounter (Signed)
PT is aware.

## 2015-09-25 ENCOUNTER — Other Ambulatory Visit: Payer: Self-pay | Admitting: Family Medicine

## 2015-11-12 ENCOUNTER — Ambulatory Visit: Payer: Medicaid Other | Admitting: Internal Medicine

## 2015-12-03 ENCOUNTER — Ambulatory Visit (INDEPENDENT_AMBULATORY_CARE_PROVIDER_SITE_OTHER): Payer: Medicaid Other | Admitting: Family Medicine

## 2015-12-03 ENCOUNTER — Ambulatory Visit (INDEPENDENT_AMBULATORY_CARE_PROVIDER_SITE_OTHER): Payer: Medicaid Other

## 2015-12-03 ENCOUNTER — Encounter: Payer: Self-pay | Admitting: Family Medicine

## 2015-12-03 VITALS — BP 119/82 | HR 72 | Temp 98.3°F | Ht 67.0 in | Wt 202.4 lb

## 2015-12-03 DIAGNOSIS — M79672 Pain in left foot: Secondary | ICD-10-CM | POA: Diagnosis not present

## 2015-12-03 MED ORDER — TRAMADOL HCL 50 MG PO TABS: 50.0000 mg | ORAL_TABLET | Freq: Three times a day (TID) | ORAL | Status: DC | PRN

## 2015-12-03 NOTE — Progress Notes (Signed)
BP 119/82 mmHg  Pulse 72  Temp(Src) 98.3 F (36.8 C) (Oral)  Ht 5\' 7"  (1.702 m)  Wt 202 lb 6.4 oz (91.808 kg)  BMI 31.69 kg/m2   Subjective:    Patient ID: Philip [name], male    DOB: [birth year], 60 y.o.   MRN: [id number]  HPI: Zak Hyke is a 61 y.o. male presenting on 12/03/2015 for Foot Pain   HPI Left great toe pain Patient is coming in today because he hit his toe last night on a coffee table and has been having significant pain in the base of his great toe on the left foot. He feels like he hit twice was walking around trying to find a flashlight when the power went out. He denies any fevers or chills but does have a little bit of overlying swelling. He denies any redness or warmth. He has never had this before and was wondering if he broke it.  Relevant past medical, surgical, family and social history reviewed and updated as indicated. Interim medical history since our last visit reviewed. Allergies and medications reviewed and updated.  Review of Systems  Constitutional: Negative for fever.  HENT: Negative for ear discharge and ear pain.   Eyes: Negative for discharge and visual disturbance.  Respiratory: Negative for shortness of breath and wheezing.   Cardiovascular: Negative for chest pain and leg swelling.  Gastrointestinal: Negative for abdominal pain, diarrhea and constipation.  Genitourinary: Negative for difficulty urinating.  Musculoskeletal: Positive for joint swelling and arthralgias. Negative for back pain and gait problem.  Skin: Negative for color change and rash.  Neurological: Negative for syncope, light-headedness and headaches.  All other systems reviewed and are negative.   Per HPI unless specifically indicated above     Medication List       This list is accurate as of: 12/03/15  5:45 PM.  Always use your most recent med list.               amLODipine 10 MG tablet  Commonly known as:  NORVASC  Take 1 tablet (10 mg total) by mouth daily.     aspirin EC 81 MG tablet  Take 81 mg by mouth daily.     esomeprazole 40 MG capsule  Commonly known as:  NEXIUM  1 PO 30 MINUTES PRIOR TO MEALS BID for 3 mos then 1 po qd     fish oil-omega-3 fatty acids 1000 MG capsule  Take 2 g by mouth daily.     gabapentin 300 MG capsule  Commonly known as:  NEURONTIN  Take 1 capsule (300 mg total) by mouth at bedtime.     glipiZIDE 5 MG tablet  Commonly known as:  GLUCOTROL  TAKE ONE TABLET BY MOUTH ONCE DAILY BEFORE BREAKFAST     hydrALAZINE 25 MG tablet  Commonly known as:  APRESOLINE  TAKE ONE TABLET BY MOUTH THREE TIMES DAILY     metoprolol succinate 50 MG 24 hr tablet  Commonly known as:  TOPROL-XL  Take 1 tablet (50 mg total) by mouth daily. Take with or immediately following a meal.     topiramate 25 MG tablet  Commonly known as:  TOPAMAX  Take 1 tablet (25 mg total) by mouth 2 (two) times daily.     traMADol 50 MG tablet  Commonly known as:  ULTRAM  Take 1 tablet (50 mg total) by mouth every 8 (eight) hours as needed.           Objective:  BP 119/82 mmHg  Pulse 72  Temp(Src) 98.3 F (36.8 C) (Oral)  Ht 5\' 7"  (1.702 m)  Wt 202 lb 6.4 oz (91.808 kg)  BMI 31.69 kg/m2  Wt Readings from Last 3 Encounters:  12/03/15 202 lb 6.4 oz (91.808 kg)  08/20/15 199 lb (90.266 kg)  08/02/15 198 lb (89.812 kg)    Physical Exam  Constitutional: He is oriented to person, place, and time. He appears well-developed and well-nourished. No distress.  Eyes: Conjunctivae and EOM are normal. Pupils are equal, round, and reactive to light. Right eye exhibits no discharge. No scleral icterus.  Neck: Neck supple. No thyromegaly present.  Cardiovascular: Normal rate, regular rhythm, normal heart sounds and intact distal pulses.   No murmur heard. Pulmonary/Chest: Effort normal and breath sounds normal. No respiratory distress. He has no wheezes.  Musculoskeletal: Normal range of motion. He exhibits no edema.        Feet:  Lymphadenopathy:    He has no cervical adenopathy.  Neurological: He is alert and oriented to person, place, and time. Coordination normal.  Skin: Skin is warm and dry. No rash noted. He is not diaphoretic.  Psychiatric: He has a normal mood and affect. His behavior is normal.  Nursing note and vitals reviewed.   Left foot x-ray: No signs of acute fracture noted. Await final read by radiology    Assessment & Plan:       Problem List Items Addressed This Visit    None    Visit Diagnoses    Left foot pain    -  Primary    Likely contusion, no signs of fracture on x-ray, await final read by radiologist    Relevant Orders    DG Foot Complete Left        Follow up plan: Return if symptoms worsen or fail to improve.  Counseling provided for all of the vaccine components Orders Placed This Encounter  Procedures  . DG Foot Complete Left    Caryl Pina, MD [facility] 12/03/2015, 5:45 PM

## 2015-12-25 ENCOUNTER — Encounter: Payer: Self-pay | Admitting: Internal Medicine

## 2015-12-25 ENCOUNTER — Ambulatory Visit (INDEPENDENT_AMBULATORY_CARE_PROVIDER_SITE_OTHER): Payer: Medicaid Other | Admitting: Internal Medicine

## 2015-12-25 ENCOUNTER — Ambulatory Visit: Payer: Medicaid Other

## 2015-12-25 VITALS — BP 162/95 | HR 80 | Temp 98.6°F | Ht 68.0 in | Wt 197.0 lb

## 2015-12-25 DIAGNOSIS — K7460 Unspecified cirrhosis of liver: Secondary | ICD-10-CM

## 2015-12-25 DIAGNOSIS — F1010 Alcohol abuse, uncomplicated: Secondary | ICD-10-CM | POA: Diagnosis not present

## 2015-12-25 DIAGNOSIS — Z23 Encounter for immunization: Secondary | ICD-10-CM | POA: Diagnosis not present

## 2015-12-25 DIAGNOSIS — B182 Chronic viral hepatitis C: Secondary | ICD-10-CM | POA: Diagnosis present

## 2015-12-25 DIAGNOSIS — F1011 Alcohol abuse, in remission: Secondary | ICD-10-CM | POA: Insufficient documentation

## 2015-12-25 NOTE — Assessment & Plan Note (Signed)
Had EGD.  Followed by Dr. Oneida Alar.  Appreciate them doing [place] screening.

## 2015-12-25 NOTE — Addendum Note (Signed)
Addended by: Landis Gandy on: 12/25/2015 04:40 PM   Modules accepted: Orders

## 2015-12-25 NOTE — Assessment & Plan Note (Addendum)
Test of cure today and will let him know of results.   RTC prn

## 2015-12-25 NOTE — Assessment & Plan Note (Signed)
Is limiting his alcohol some, but still has more than one beer occasionally.  I discussed continued efforts to avoid alcohol.

## 2015-12-25 NOTE — Progress Notes (Signed)
   Subjective:    Patient ID: Philip [name], male    DOB: 21-Aug-1955, 60 y.o.   MRN: [id number]  HPI Here for follow up of HCV.   Has genotype 1a, viral load 12 million, started on Harvoni for 12 weeks.  Early and after treatment viral load undetectable.  Elastography with F3/4.  Had EGD by Dr. Oneida Alar and only H pylori.  Has follow up with her in 6 months.  No associated weight loss.     Review of Systems  Constitutional: Negative for fatigue.  Gastrointestinal: Negative for diarrhea and nausea.  Skin: Negative for rash.       Objective:   Physical Exam  Constitutional: He appears well-developed and well-nourished. No distress.  Eyes: No scleral icterus.  Cardiovascular: Normal rate, regular rhythm and normal heart sounds.   No murmur heard. Skin: No rash noted.    Social History   Social History  . Marital status: Single    Spouse name: N/A  . Number of children: N/A  . Years of education: N/A   Occupational History  . Not on file.   Social History Main Topics  . Smoking status: Current Every Day Smoker    Packs/day: 0.20    Years: 25.00    Types: Cigarettes    Start date: 05/26/1978  . Smokeless tobacco: Never Used     Comment: slowing down  . Alcohol use 1.8 oz/week    2 Cans of beer, 1 Standard drinks or equivalent per week     Comment: OCCASSIONAL  . Drug use: No  . Sexual activity: Not on file   Other Topics Concern  . Not on file   Social History Narrative  . No narrative on file        Assessment & Plan:

## 2015-12-26 ENCOUNTER — Ambulatory Visit: Payer: Medicaid Other

## 2015-12-27 ENCOUNTER — Telehealth: Payer: Self-pay | Admitting: *Deleted

## 2015-12-27 LAB — HEPATITIS C RNA QUANTITATIVE: HCV QUANT: NOT DETECTED [IU]/mL (ref <15)

## 2015-12-27 NOTE — Telephone Encounter (Signed)
-----   Message from Thayer Headings, MD sent at 12/27/2015 12:12 PM EDT ----- Please let him know his final HCV viral load remains negagtive and he is now considered cured   No follow up needed thanks

## 2015-12-27 NOTE — Telephone Encounter (Signed)
Let patient know Dr. Henreitta Leber comments.  Pt is very happy.

## 2016-02-21 ENCOUNTER — Ambulatory Visit: Payer: Medicaid Other | Admitting: Gastroenterology

## 2016-02-24 ENCOUNTER — Other Ambulatory Visit: Payer: Self-pay | Admitting: Family Medicine

## 2016-03-11 ENCOUNTER — Encounter: Payer: Self-pay | Admitting: Gastroenterology

## 2016-03-11 ENCOUNTER — Other Ambulatory Visit: Payer: Self-pay

## 2016-03-11 ENCOUNTER — Ambulatory Visit (INDEPENDENT_AMBULATORY_CARE_PROVIDER_SITE_OTHER): Payer: Medicaid Other | Admitting: Gastroenterology

## 2016-03-11 ENCOUNTER — Telehealth: Payer: Self-pay

## 2016-03-11 VITALS — BP 145/95 | HR 74 | Temp 97.8°F | Ht 67.0 in | Wt 195.0 lb

## 2016-03-11 DIAGNOSIS — K279 Peptic ulcer, site unspecified, unspecified as acute or chronic, without hemorrhage or perforation: Secondary | ICD-10-CM | POA: Diagnosis not present

## 2016-03-11 DIAGNOSIS — K7469 Other cirrhosis of liver: Secondary | ICD-10-CM

## 2016-03-11 MED ORDER — TRAMADOL HCL 50 MG PO TABS: 50.0000 mg | ORAL_TABLET | Freq: Three times a day (TID) | ORAL | 0 refills | Status: DC | PRN

## 2016-03-11 NOTE — Progress Notes (Signed)
Referring Provider: Timmothy Euler, MD Primary Care Physician:  Kenn File, MD  Primary GI: Dr. Oneida Alar   Chief Complaint  Patient presents with  . Follow-up    HPI:   Philip [name] is a 60 y.o. male presenting today with a history of Hep C cirrhosis (completed treatment with Dr. Linus Salmons and achieved SVR). He is due for hepatoma screening. Recent EGD earlier this year for variceal screening revealed PUD, and he was +H.pylori. Completed therapy. Now needs EGD for surveillance of PUD. Will need EGD for variceal screening again in 3 years.   Drinks a beer every once in awhile. Takes Nexium daily. No abdominal pain. No other GI concerns. Doing well. Nexium BID.   Past Medical History:  Diagnosis Date  . Diabetes mellitus without complication (HCC)    borderline  . Erectile dysfunction   . Headache(784.0)    "Headache, I dont think they are migranes"  . Hepatitis C    s/p Harvoni, completed 06/2015 by Dr. Linus Salmons  . History of migraine    takes Topamax daily  . Hypertension    takes Hydralazine,Maxzide,Clonidine,and Amlodipine daily  . Hypertension    also takes Metoprolol daily  . Nausea    takes Zofran daily  . Seizures ([place])    off Dilantin daily.  last one was when he had a stroke.  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 Sleep apnea    CPAP- tries to do it everyday.  . Stroke ([place])    .  Short term memory loss.  . Tobacco user     Past Surgical History:  Procedure Laterality Date  . CHOLECYSTECTOMY N/A 03/09/2013   Procedure: LAPAROSCOPIC CHOLECYSTECTOMY WITH INTRAOPERATIVE CHOLANGIOGRAM;  Surgeon: Imogene Burn. Georgette Dover, MD;  Location: [facility];  Service: General;  Laterality: N/A;  . COLONOSCOPY  06/2014   Dr. Olevia Perches: Three sessile polyps were found in the rectum x2 and descending. tubular adenomas. colonoscopy in 06/2019  . ESOPHAGOGASTRODUODENOSCOPY (EGD) WITH PROPOFOL N/A 08/21/2015   Dr. Oneida Alar: normal esophagus, non-bleeding gastric ulcers/gastritis  (H.pylori)   . UMBILICAL HERNIA REPAIR N/A 03/09/2013   Procedure: HERNIA REPAIR UMBILICAL ADULT;  Surgeon: Imogene Burn. Georgette Dover, MD;  Location: [facility];  Service: General;  Laterality: N/A;    Current Outpatient Prescriptions  Medication Sig Dispense Refill  . amLODipine (NORVASC) 10 MG tablet Take 1 tablet (10 mg total) by mouth daily. 30 tablet 5  . aspirin EC 81 MG tablet Take 81 mg by mouth daily.    Marland Kitchen esomeprazole (NEXIUM) 40 MG capsule 1 PO 30 MINUTES PRIOR TO MEALS BID for 3 mos then 1 po qd 60 capsule 11  . fish oil-omega-3 fatty acids 1000 MG capsule Take 2 g by mouth daily.    Marland Kitchen gabapentin (NEURONTIN) 300 MG capsule Take 1 capsule (300 mg total) by mouth at bedtime. 30 capsule 5  . glipiZIDE (GLUCOTROL) 5 MG tablet TAKE ONE TABLET BY MOUTH ONCE DAILY BEFORE BREAKFAST 30 tablet 5  . hydrALAZINE (APRESOLINE) 25 MG tablet TAKE ONE TABLET BY MOUTH THREE TIMES DAILY 90 tablet 0  . metoprolol succinate (TOPROL-XL) 50 MG 24 hr tablet Take 1 tablet (50 mg total) by mouth daily. Take with or immediately following a meal. 30 tablet 5  . metoprolol succinate (TOPROL-XL) 50 MG 24 hr tablet TAKE TWO TABLETS BY MOUTH ONCE DAILY WITH OR IMMEDIATELY FOLLOWING A MEAL 60 tablet 0  . topiramate (TOPAMAX) 25 MG tablet Take 1  tablet (25 mg total) by mouth 2 (two) times daily. 60 tablet 5  . traMADol (ULTRAM) 50 MG tablet Take 1 tablet (50 mg total) by mouth every 8 (eight) hours as needed. 30 tablet 0   No current facility-administered medications for this visit.     Allergies as of 03/11/2016  . (No Known Allergies)    Family History  Problem Relation Age of Onset  . Heart disease Maternal Grandmother   . Heart disease Maternal Grandfather   . Heart disease Paternal Grandmother   . Heart disease Paternal Grandfather   . Cancer Cousin   . Hypertension Mother   . Colon cancer Maternal Uncle   . Rectal cancer Neg Hx   . Stomach cancer Neg Hx     Social History   Social History  . Marital  status: Single    Spouse name: N/A  . Number of children: N/A  . Years of education: N/A   Social History Main Topics  . Smoking status: Current Every Day Smoker    Packs/day: 0.20    Years: 25.00    Types: Cigarettes    Start date: 05/26/1978  . Smokeless tobacco: Never Used     Comment: slowing down  . Alcohol use 1.8 oz/week    2 Cans of beer, 1 Standard drinks or equivalent per week     Comment: OCCASSIONAL  . Drug use: No  . Sexual activity: Not Asked   Other Topics Concern  . None   Social History Narrative  . None    Review of Systems: Negative unless mentioned in HPI   Physical Exam: BP (!) 145/95   Pulse 74   Temp 97.8 F (36.6 C) (Oral)   Ht 5\' 7"  (1.702 m)   Wt 195 lb (88.5 kg)   BMI 30.54 kg/m  General:   Alert and oriented. No distress noted. Pleasant and cooperative.  Head:  Normocephalic and atraumatic. Eyes:  Conjuctiva clear without scleral icterus. Heart:  S1, S2 present without murmurs, rubs, or gallops. Regular rate and rhythm. Abdomen:  +BS, soft, non-tender and non-distended. No rebound or guarding. No HSM or masses noted. Msk:  Symmetrical without gross deformities. Normal posture. Extremities:  Without edema. Neurologic:  Alert and  oriented x4;  grossly normal neurologically. Psych:  Alert and cooperative. Normal mood and affect.   Lab Results  Component Value Date   ALT 22 07/04/2015   AST 22 07/04/2015   ALKPHOS 91 07/04/2015   BILITOT 0.5 07/04/2015   Lab Results  Component Value Date   WBC 7.9 08/20/2015   HGB 15.9 08/20/2015   HCT 45.5 08/20/2015   MCV 90.8 08/20/2015   PLT 144 (L) 08/20/2015   Lab Results  Component Value Date   CREATININE 1.25 (H) 08/20/2015   BUN 10 08/20/2015   NA 138 08/20/2015   K 3.8 08/20/2015   CL 107 08/20/2015   CO2 22 08/20/2015

## 2016-03-11 NOTE — Patient Instructions (Signed)
We have scheduled you for an upper endoscopy with Dr. Oneida Alar in the near future to make sure the ulcer is healed.   We have also scheduled you for an ultrasound of your liver.   We will see you in 6 months.

## 2016-03-11 NOTE — Telephone Encounter (Signed)
LMOM and informed of pre-op appt 03/26/16 at 1:45 pm. Letter also mailed.

## 2016-03-13 ENCOUNTER — Encounter: Payer: Self-pay | Admitting: Gastroenterology

## 2016-03-13 NOTE — Assessment & Plan Note (Signed)
Due for hepatoma screening. Schedule ultrasound now. Next EGD in 2020 for variceal screening. Hep C treatment completed and achieved SVR.

## 2016-03-13 NOTE — Assessment & Plan Note (Signed)
60 year old with PUD on EGD earlier this year, now with need for surveillance EGD. Completed therapy for H.pylori. No GI complaints and remains on Nexium BID.   Proceed with upper endoscopy in the near future with Dr. Oneida Alar. The risks, benefits, and alternatives have been discussed in detail with patient. They have stated understanding and desire to proceed.  PROPOFOL due to polypharmacy

## 2016-03-14 NOTE — Progress Notes (Signed)
CC'ED TO PCP 

## 2016-03-19 ENCOUNTER — Ambulatory Visit (HOSPITAL_COMMUNITY): Admission: RE | Admit: 2016-03-19 | Payer: Medicaid Other | Source: Ambulatory Visit

## 2016-03-25 NOTE — Patient Instructions (Signed)
Philip [name]  03/25/2016     @PREFPERIOPPHARMACY @   Your procedure is scheduled on  04/01/2016   Report to [facility] at  1230  P.M.  Call this number if you have problems the morning of surgery:  [phone]   Remember:  Do not eat food or drink liquids after midnight.  Take these medicines the morning of surgery with A SIP OF WATER  Amlodipine, nexium, Neurontin, metorpolol, topamax, ultram.   Do not wear jewelry, make-up or nail polish.  Do not wear lotions, powders, or perfumes, or deoderant.  Do not shave 48 hours prior to surgery.  Men may shave face and neck.  Do not bring valuables to the hospital.  [facility] is not responsible for any belongings or valuables.  Contacts, dentures or bridgework may not be worn into surgery.  Leave your suitcase in the car.  After surgery it may be brought to your room.  For patients admitted to the hospital, discharge time will be determined by your treatment team.  Patients discharged the day of surgery will not be allowed to drive home.   Name and phone number of your driver:   family Special instructions:  Follow the diet instructions given to you by Dr Nona Dell office.  Please read over the following fact sheets that you were given. Anesthesia Post-op Instructions and Care and Recovery After Surgery        Esophagogastroduodenoscopy Esophagogastroduodenoscopy (EGD) is a procedure that is used to examine the lining of the esophagus, stomach, and first part of the small intestine (duodenum). A long, flexible, lighted tube with a camera attached (endoscope) is inserted down the throat to view these organs. This procedure is done to detect problems or abnormalities, such as inflammation, bleeding, ulcers, or growths, in order to treat them. The procedure lasts 5-20 minutes. It is usually an outpatient procedure, but it may need to be performed in a hospital in emergency cases. LET [facility] CARE PROVIDER KNOW  ABOUT:  Any allergies you have.  All medicines you are taking, including vitamins, herbs, eye drops, creams, and over-the-counter medicines.  Previous problems you or members of your family have had with the use of anesthetics.  Any blood disorders you have.  Previous surgeries you have had.  Medical conditions you have. RISKS AND COMPLICATIONS Generally, this is a safe procedure. However, problems can occur and include:  Infection.  Bleeding.  Tearing (perforation) of the esophagus, stomach, or duodenum.  Difficulty breathing or not being able to breathe.  Excessive sweating.  Spasms of the larynx.  Slowed heartbeat.  Low blood pressure. BEFORE THE PROCEDURE  Do not eat or drink anything after midnight on the night before the procedure or as directed by your health care provider.  Do not take your regular medicines before the procedure if your health care provider asks you not to. Ask your health care provider about changing or stopping those medicines.  If you wear dentures, be prepared to remove them before the procedure.  Arrange for someone to drive you home after the procedure. PROCEDURE  A numbing medicine (local anesthetic) may be sprayed in your throat for comfort and to stop you from gagging or coughing.  You will have an IV tube inserted in a vein in your hand or arm. You will receive medicines and fluids through this tube.  You will be given a medicine to relax you (sedative).  A pain reliever will be  given through the IV tube.  A mouth guard may be placed in your mouth to protect your teeth and to keep you from biting on the endoscope.  You will be asked to lie on your left side.  The endoscope will be inserted down your throat and into your esophagus, stomach, and duodenum.  Air will be put through the endoscope to allow your health care provider to clearly view the lining of your esophagus.  The lining of your esophagus, stomach, and duodenum  will be examined. During the exam, your health care provider may:  Remove tissue to be examined under a microscope (biopsy) for inflammation, infection, or other medical problems.  Remove growths.  Remove objects (foreign bodies) that are stuck.  Treat any bleeding with medicines or other devices that stop tissues from bleeding (hot cautery, clipping devices).  Widen (dilate) or stretch narrowed areas of your esophagus and stomach.  The endoscope will be withdrawn. AFTER THE PROCEDURE  You will be taken to a recovery area for observation. Your blood pressure, heart rate, breathing rate, and blood oxygen level will be monitored often until the medicines you were given have worn off.  Do not eat or drink anything until the numbing medicine has worn off and your gag reflex has returned. You may choke.  Your health care provider should be able to discuss his or her findings with you. It will take longer to discuss the test results if any biopsies were taken.   This information is not intended to replace advice given to you by your health care provider. Make sure you discuss any questions you have with your health care provider.   Document Released: 09/12/2004 Document Revised: 06/02/2014 Document Reviewed: 04/14/2012 Elsevier Interactive Patient Education 2016 [place]. Esophagogastroduodenoscopy, Care After Refer to this sheet in the next few weeks. These instructions provide you with information about caring for yourself after your procedure. Your health care provider may also give you more specific instructions. Your treatment has been planned according to current medical practices, but problems sometimes occur. Call your health care provider if you have any problems or questions after your procedure. WHAT TO EXPECT AFTER THE PROCEDURE After your procedure, it is typical to feel:  Soreness in your throat.  Pain with swallowing.  Sick to your stomach  (nauseous).  Bloated.  Dizzy.  Fatigued. HOME CARE INSTRUCTIONS  Do not eat or drink anything until the numbing medicine (local anesthetic) has worn off and your gag reflex has returned. You will know that the local anesthetic has worn off when you can swallow comfortably.  Do not drive or operate machinery until directed by your health care provider.  Take medicines only as directed by your health care provider. SEEK MEDICAL CARE IF:   You cannot stop coughing.  You are not urinating at all or less than usual. SEEK IMMEDIATE MEDICAL CARE IF:  You have difficulty swallowing.  You cannot eat or drink.  You have worsening throat or chest pain.  You have dizziness or lightheadedness or you faint.  You have nausea or vomiting.  You have chills.  You have a fever.  You have severe abdominal pain.  You have black, tarry, or bloody stools.   This information is not intended to replace advice given to you by your health care provider. Make sure you discuss any questions you have with your health care provider.   Document Released: 04/28/2012 Document Revised: 06/02/2014 Document Reviewed: 04/28/2012 Elsevier Interactive Patient Education [employer].  Monitored Anesthesia Care Monitored anesthesia care is an anesthesia service for a medical procedure. Anesthesia is the loss of the ability to feel pain. It is produced by medicines called anesthetics. It may affect a small area of your body (local anesthesia), a large area of your body (regional anesthesia), or your entire body (general anesthesia). The need for monitored anesthesia care depends your procedure, your condition, and the potential need for regional or general anesthesia. It is often provided during procedures where:   General anesthesia may be needed if there are complications. This is because you need special care when you are under general anesthesia.   You will be under local or regional anesthesia. This  is so that you are able to have higher levels of anesthesia if needed.   You will receive calming medicines (sedatives). This is especially the case if sedatives are given to put you in a semi-conscious state of relaxation (deep sedation). This is because the amount of sedative needed to produce this state can be hard to predict. Too much of a sedative can produce general anesthesia. Monitored anesthesia care is performed by one or more health care providers who have special training in all types of anesthesia. You will need to meet with these health care providers before your procedure. During this meeting, they will ask you about your medical history. They will also give you instructions to follow. (For example, you will need to stop eating and drinking before your procedure. You may also need to stop or change medicines you are taking.) During your procedure, your health care providers will stay with you. They will:   Watch your condition. This includes watching your blood pressure, breathing, and level of pain.   Diagnose and treat problems that occur.   Give medicines if they are needed. These may include calming medicines (sedatives) and anesthetics.   Make sure you are comfortable.  Having monitored anesthesia care does not necessarily mean that you will be under anesthesia. It does mean that your health care providers will be able to manage anesthesia if you need it or if it occurs. It also means that you will be able to have a different type of anesthesia than you are having if you need it. When your procedure is complete, your health care providers will continue to watch your condition. They will make sure any medicines wear off before you are allowed to go home.    This information is not intended to replace advice given to you by your health care provider. Make sure you discuss any questions you have with your health care provider.   Document Released: 02/05/2005 Document Revised:  06/02/2014 Document Reviewed: 06/23/2012 Elsevier Interactive Patient Education 2016 [employer] PATIENT INSTRUCTIONS POST-ANESTHESIA  IMMEDIATELY FOLLOWING SURGERY:  Do not drive or operate machinery for the first twenty four hours after surgery.  Do not make any important decisions for twenty four hours after surgery or while taking narcotic pain medications or sedatives.  If you develop intractable nausea and vomiting or a severe headache please notify your doctor immediately.  FOLLOW-UP:  Please make an appointment with your surgeon as instructed. You do not need to follow up with anesthesia unless specifically instructed to do so.  WOUND CARE INSTRUCTIONS (if applicable):  Keep a dry clean dressing on the anesthesia/puncture wound site if there is drainage.  Once the wound has quit draining you may leave it open to air.  Generally you should leave the bandage intact for twenty four hours  unless there is drainage.  If the epidural site drains for more than 36-48 hours please call the anesthesia department.  QUESTIONS?:  Please feel free to call your physician or the hospital operator if you have any questions, and they will be happy to assist you.

## 2016-03-26 ENCOUNTER — Encounter (HOSPITAL_COMMUNITY)
Admission: RE | Admit: 2016-03-26 | Discharge: 2016-03-26 | Disposition: A | Discharge status: Home / Self Care | Payer: Medicaid Other | Source: Ambulatory Visit | Attending: Gastroenterology | Admitting: Gastroenterology

## 2016-03-26 ENCOUNTER — Encounter (HOSPITAL_COMMUNITY): Payer: Self-pay

## 2016-03-26 DIAGNOSIS — Z01818 Encounter for other preprocedural examination: Secondary | ICD-10-CM | POA: Insufficient documentation

## 2016-03-26 HISTORY — DX: Personal history of urinary calculi: Z87.442

## 2016-03-26 HISTORY — DX: Gastro-esophageal reflux disease without esophagitis: K21.9

## 2016-03-26 LAB — CBC WITH DIFFERENTIAL/PLATELET
BASOS ABS: 0 10*3/uL (ref 0.0–0.1)
Basophils Relative: 1 %
EOS PCT: 7 %
Eosinophils Absolute: 0.6 10*3/uL (ref 0.0–0.7)
HCT: 46.5 % (ref 39.0–52.0)
Hemoglobin: 16.1 g/dL (ref 13.0–17.0)
LYMPHS ABS: 2.5 10*3/uL (ref 0.7–4.0)
LYMPHS PCT: 32 %
MCH: 31.3 pg (ref 26.0–34.0)
MCHC: 34.6 g/dL (ref 30.0–36.0)
MCV: 90.5 fL (ref 78.0–100.0)
MONO ABS: 1.1 10*3/uL — AB (ref 0.1–1.0)
MONOS PCT: 14 %
Neutro Abs: 3.6 10*3/uL (ref 1.7–7.7)
Neutrophils Relative %: 46 %
PLATELETS: 149 10*3/uL — AB (ref 150–400)
RBC: 5.14 MIL/uL (ref 4.22–5.81)
RDW: 14.3 % (ref 11.5–15.5)
WBC: 7.7 10*3/uL (ref 4.0–10.5)

## 2016-03-26 LAB — BASIC METABOLIC PANEL
Anion gap: 7 (ref 5–15)
BUN: 12 mg/dL (ref 6–20)
CALCIUM: 9.6 mg/dL (ref 8.9–10.3)
CO2: 24 mmol/L (ref 22–32)
Chloride: 103 mmol/L (ref 101–111)
Creatinine, Ser: 1.23 mg/dL (ref 0.61–1.24)
GFR calc Af Amer: >60 mL/min (ref >60)
GLUCOSE: 62 mg/dL — AB (ref 65–99)
Potassium: 4.2 mmol/L (ref 3.5–5.1)
Sodium: 134 mmol/L — ABNORMAL LOW (ref 135–145)

## 2016-03-27 ENCOUNTER — Ambulatory Visit (HOSPITAL_COMMUNITY): Admission: RE | Admit: 2016-03-27 | Payer: Medicaid Other | Source: Ambulatory Visit

## 2016-04-01 ENCOUNTER — Ambulatory Visit (HOSPITAL_COMMUNITY): Admission: RE | Admit: 2016-04-01 | Payer: Medicaid Other | Source: Ambulatory Visit | Admitting: Gastroenterology

## 2016-04-01 ENCOUNTER — Encounter (HOSPITAL_COMMUNITY): Payer: Self-pay | Admitting: Anesthesiology

## 2016-04-01 ENCOUNTER — Other Ambulatory Visit: Payer: Self-pay

## 2016-04-01 ENCOUNTER — Encounter (HOSPITAL_COMMUNITY): Admission: RE | Payer: Self-pay | Source: Ambulatory Visit

## 2016-04-01 SURGERY — ESOPHAGOGASTRODUODENOSCOPY (EGD) WITH PROPOFOL
Anesthesia: Monitor Anesthesia Care

## 2016-04-06 ENCOUNTER — Other Ambulatory Visit: Payer: Self-pay | Admitting: Family Medicine

## 2016-04-13 ENCOUNTER — Other Ambulatory Visit: Payer: Self-pay | Admitting: Family Medicine

## 2016-04-21 ENCOUNTER — Other Ambulatory Visit: Payer: Self-pay | Admitting: Family Medicine

## 2016-05-23 ENCOUNTER — Other Ambulatory Visit: Payer: Self-pay | Admitting: Family Medicine

## 2016-05-27 NOTE — Telephone Encounter (Signed)
Please send request to his PCP 

## 2016-07-02 ENCOUNTER — Other Ambulatory Visit: Payer: Self-pay | Admitting: Family Medicine

## 2016-07-08 ENCOUNTER — Ambulatory Visit (INDEPENDENT_AMBULATORY_CARE_PROVIDER_SITE_OTHER): Payer: Medicaid Other | Admitting: Family Medicine

## 2016-07-08 ENCOUNTER — Encounter: Payer: Self-pay | Admitting: Family Medicine

## 2016-07-08 VITALS — BP 127/84 | HR 76 | Temp 97.8°F | Ht 67.0 in | Wt 201.4 lb

## 2016-07-08 DIAGNOSIS — I10 Essential (primary) hypertension: Secondary | ICD-10-CM

## 2016-07-08 DIAGNOSIS — E118 Type 2 diabetes mellitus with unspecified complications: Secondary | ICD-10-CM

## 2016-07-08 DIAGNOSIS — K7460 Unspecified cirrhosis of liver: Secondary | ICD-10-CM

## 2016-07-08 DIAGNOSIS — E785 Hyperlipidemia, unspecified: Secondary | ICD-10-CM | POA: Diagnosis not present

## 2016-07-08 LAB — BAYER DCA HB A1C WAIVED: HB A1C: 5.4 % (ref <7.0)

## 2016-07-08 MED ORDER — TOPIRAMATE 25 MG PO TABS: 25.0000 mg | ORAL_TABLET | Freq: Two times a day (BID) | ORAL | 3 refills | Status: DC

## 2016-07-08 MED ORDER — AMLODIPINE BESYLATE 10 MG PO TABS: 10.0000 mg | ORAL_TABLET | Freq: Every day | ORAL | 3 refills | Status: DC

## 2016-07-08 MED ORDER — SILDENAFIL CITRATE 20 MG PO TABS: ORAL_TABLET | ORAL | 3 refills | Status: DC

## 2016-07-08 MED ORDER — TRAMADOL HCL 50 MG PO TABS: 50.0000 mg | ORAL_TABLET | Freq: Three times a day (TID) | ORAL | 0 refills | Status: DC | PRN

## 2016-07-08 MED ORDER — ESOMEPRAZOLE MAGNESIUM 40 MG PO CPDR: 40.0000 mg | DELAYED_RELEASE_CAPSULE | Freq: Every day | ORAL | 3 refills | Status: DC

## 2016-07-08 MED ORDER — HYDRALAZINE HCL 25 MG PO TABS: 25.0000 mg | ORAL_TABLET | Freq: Three times a day (TID) | ORAL | 3 refills | Status: DC

## 2016-07-08 NOTE — Progress Notes (Signed)
HPI  Patient presents today here for follow-up of chronic medical conditions.  Patient feels well and has no complaints.  He has occasional right-sided headache in the right parietal area. He cannot take NSAIDs or Tylenol due to PUD and cirrhosis respectively. Tramadol very effective, otherwise he has headache until he goes to bed. He states that he drinks 1 beer approximately one or 2 times a week. Denies any drug abuse, so denies any anxiety, depression, or suicidal thoughts.  Dm2 Taking glipizide, no hypoglycemia Not checking blood sugars Not really watching his diet.  Cirrhosis Patient avoiding Tylenol, minimizing alcohol.  Previous history of alcohol abuse, now in remission, only drinking 1-2 times a week.  Hyperlipidemia Good medication compliance.  Erectile dysfunction Good improvement from brand name Viagra, however did not titrate sildenafil 20 mg to affect.   PMH: Smoking status noted ROS: Per HPI  Objective: BP 127/84   Pulse 76   Temp 97.8 F (36.6 C) (Oral)   Ht '5\' 7"'$  (1.702 m)   Wt 201 lb 6.4 oz (91.4 kg)   BMI 31.54 kg/m  Gen: NAD, alert, cooperative with exam HEENT: NCAT, oropharynx clear CV: RRR, good S1/S2, no murmur Resp: CTABL, no wheezes, non-labored Abd: SNTND, BS present, no guarding or organomegaly Ext: No edema, warm Neuro: Alert and oriented, No gross deficits  Diabetic Foot Exam - Simple   Simple Foot Form Diabetic Foot exam was performed with the following findings:  Yes 07/08/2016 11:31 AM  Visual Inspection No deformities, no ulcerations, no other skin breakdown bilaterally:  Yes Sensation Testing Intact to touch and monofilament testing bilaterally:  Yes Pulse Check Posterior Tibialis and Dorsalis pulse intact bilaterally:  Yes Comments      Assessment and plan:  # Hypertension Well-controlled on hydralazine plus amlodipine No changes Labs  # Cirrhosis of the liver without ascites Repeat labs Avoid Tylenol and  minimize alcohol   # Hyperlipidemia Clinically stable Hx of CVA- ideally LDL less than 70 labs  # T2DM Previous A1C 5.1 DC glipizide for now pending A1C, If still less than 6 would dc completely Ophtho, foot exam WNL   Orders Placed This Encounter  Procedures  . Lipid panel  . CBC with Differential/Platelet  . CMP14+EGFR  . Bayer DCA Hb A1c Waived  . Ambulatory referral to Ophthalmology    Referral Priority:   Routine    Referral Type:   Consultation    Referral Reason:   Specialty Services Required    Requested Specialty:   Ophthalmology    Number of Visits Requested:   1    Meds ordered this encounter  Medications  . amLODipine (NORVASC) 10 MG tablet    Sig: Take 1 tablet (10 mg total) by mouth daily.    Dispense:  90 tablet    Refill:  3  . esomeprazole (NEXIUM) 40 MG capsule    Sig: Take 1 capsule (40 mg total) by mouth daily.    Dispense:  90 capsule    Refill:  3  . hydrALAZINE (APRESOLINE) 25 MG tablet    Sig: Take 1 tablet (25 mg total) by mouth 3 (three) times daily.    Dispense:  270 tablet    Refill:  3    Please consider 90 day supplies to promote better adherence  . topiramate (TOPAMAX) 25 MG tablet    Sig: Take 1 tablet (25 mg total) by mouth 2 (two) times daily.    Dispense:  180 tablet    Refill:  3    Please consider 90 day supplies to promote better adherence  . traMADol (ULTRAM) 50 MG tablet    Sig: Take 1 tablet (50 mg total) by mouth every 8 (eight) hours as needed.    Dispense:  30 tablet    Refill:  0    Laroy Apple, MD [facility] 07/08/2016, 10:28 AM

## 2016-07-08 NOTE — Patient Instructions (Signed)
Great to see you!  Come back in 3-4 month sunless you need [place] ooner.   It is ok to stop glipizide for now, if you need to restart it based on your labs I will let you know.   Tramadol is a controlled substance, so eventually you may need to be placed on a pain contract. Philip [name] have to be seen for refills.

## 2016-07-09 LAB — CBC WITH DIFFERENTIAL/PLATELET
BASOS ABS: 0 10*3/uL (ref 0.0–0.2)
Basos: 0 %
EOS (ABSOLUTE): 0.5 10*3/uL — AB (ref 0.0–0.4)
EOS: 9 %
HEMATOCRIT: 43.9 % (ref 37.5–51.0)
Hemoglobin: 14.8 g/dL (ref 13.0–17.7)
IMMATURE GRANULOCYTES: 0 %
Immature Grans (Abs): 0 10*3/uL (ref 0.0–0.1)
LYMPHS ABS: 1.6 10*3/uL (ref 0.7–3.1)
Lymphs: 30 %
MCH: 30.3 pg (ref 26.6–33.0)
MCHC: 33.7 g/dL (ref 31.5–35.7)
MCV: 90 fL (ref 79–97)
MONOS ABS: 0.4 10*3/uL (ref 0.1–0.9)
Monocytes: 9 %
NEUTROS PCT: 52 %
Neutrophils Absolute: 2.6 10*3/uL (ref 1.4–7.0)
PLATELETS: 116 10*3/uL — AB (ref 150–379)
RBC: 4.88 x10E6/uL (ref 4.14–5.80)
RDW: 14.6 % (ref 12.3–15.4)
WBC: 5.1 10*3/uL (ref 3.4–10.8)

## 2016-07-09 LAB — CMP14+EGFR
A/G RATIO: 1.5 (ref 1.2–2.2)
ALT: 24 IU/L (ref 0–44)
AST: 16 IU/L (ref 0–40)
Albumin: 4.1 g/dL (ref 3.6–4.8)
Alkaline Phosphatase: 89 IU/L (ref 39–117)
BUN/Creatinine Ratio: 10 (ref 10–24)
BUN: 13 mg/dL (ref 8–27)
Bilirubin Total: 0.4 mg/dL (ref 0.0–1.2)
CALCIUM: 9.2 mg/dL (ref 8.6–10.2)
CO2: 22 mmol/L (ref 18–29)
CREATININE: 1.28 mg/dL — AB (ref 0.76–1.27)
Chloride: 103 mmol/L (ref 96–106)
GFR calc non Af Amer: 60 mL/min/{1.73_m2} (ref >59)
GFR, EST AFRICAN AMERICAN: 70 mL/min/{1.73_m2} (ref >59)
GLOBULIN, TOTAL: 2.7 g/dL (ref 1.5–4.5)
Glucose: 207 mg/dL — ABNORMAL HIGH (ref 65–99)
Potassium: 3.6 mmol/L (ref 3.5–5.2)
Sodium: 140 mmol/L (ref 134–144)
Total Protein: 6.8 g/dL (ref 6.0–8.5)

## 2016-07-09 LAB — LIPID PANEL
Chol/HDL Ratio: 3.5 ratio units (ref 0.0–5.0)
Cholesterol, Total: 189 mg/dL (ref 100–199)
HDL: 54 mg/dL (ref >39)
LDL CALC: 80 mg/dL (ref 0–99)
Triglycerides: 276 mg/dL — ABNORMAL HIGH (ref 0–149)
VLDL CHOLESTEROL CAL: 55 mg/dL — AB (ref 5–40)

## 2016-08-21 ENCOUNTER — Other Ambulatory Visit: Payer: Self-pay | Admitting: Family Medicine

## 2016-08-21 ENCOUNTER — Other Ambulatory Visit: Payer: Self-pay | Admitting: Gastroenterology

## 2016-09-09 ENCOUNTER — Encounter: Payer: Self-pay | Admitting: Gastroenterology

## 2016-09-09 ENCOUNTER — Ambulatory Visit: Payer: Medicaid Other | Admitting: Gastroenterology

## 2016-09-09 ENCOUNTER — Telehealth: Payer: Self-pay | Admitting: Gastroenterology

## 2016-09-09 NOTE — Telephone Encounter (Signed)
PATIENT WAS A NO SHOW AND LETTER SENT  °

## 2016-09-28 ENCOUNTER — Other Ambulatory Visit: Payer: Self-pay | Admitting: Family Medicine

## 2016-10-03 ENCOUNTER — Ambulatory Visit: Payer: Medicaid Other | Admitting: Gastroenterology

## 2016-10-29 ENCOUNTER — Ambulatory Visit: Payer: Medicaid Other | Admitting: Gastroenterology

## 2016-11-06 ENCOUNTER — Telehealth: Payer: Self-pay | Admitting: Family Medicine

## 2016-11-06 NOTE — Telephone Encounter (Signed)
Decided to make appt instead of leaving message.

## 2016-12-07 ENCOUNTER — Other Ambulatory Visit: Payer: Self-pay | Admitting: Family Medicine

## 2016-12-19 ENCOUNTER — Ambulatory Visit: Payer: Medicaid Other | Admitting: Gastroenterology

## 2017-01-28 ENCOUNTER — Other Ambulatory Visit: Payer: Self-pay

## 2017-01-28 ENCOUNTER — Ambulatory Visit (INDEPENDENT_AMBULATORY_CARE_PROVIDER_SITE_OTHER): Payer: Medicaid Other | Admitting: Nurse Practitioner

## 2017-01-28 ENCOUNTER — Encounter: Payer: Self-pay | Admitting: Nurse Practitioner

## 2017-01-28 ENCOUNTER — Telehealth: Payer: Self-pay

## 2017-01-28 VITALS — BP 150/103 | HR 95 | Temp 98.3°F | Ht 67.0 in | Wt 195.4 lb

## 2017-01-28 DIAGNOSIS — K7460 Unspecified cirrhosis of liver: Secondary | ICD-10-CM | POA: Diagnosis not present

## 2017-01-28 DIAGNOSIS — K279 Peptic ulcer, site unspecified, unspecified as acute or chronic, without hemorrhage or perforation: Secondary | ICD-10-CM | POA: Diagnosis not present

## 2017-01-28 NOTE — Progress Notes (Addendum)
REVIEWED-NO ADDITIONAL RECOMMENDATIONS.  Referring Provider: Timmothy Euler, MD Primary Care Physician:  Timmothy Euler, MD Primary GI:  Dr. Oneida Alar  Chief Complaint  Patient presents with  . Peptic Ulcer Disease    HPI:   Philip [name] is a 61 y.o. male who presents for follow-up on peptic ulcer disease. The patient was last seen in our office 03/11/2016 for the same as well as cirrhosis of the liver. At that time noted history of hepatitis C cirrhosis status post treatment and sustained viral response. Due for hepatoma screening at that time, EGD earlier in a year for variceal screening revealed peptic ulcer disease and positive for H. pylori. He completed antibiotic therapy and was now due for surveillance EGD for peptic ulcer disease. Continues to drink a beer "every once in a while." On Nexium twice daily, no other GI concerns. Recommended ultrasound, place on recall list for EGD in 2020 for variceal screening. Arranged for upper endoscopy for surveillance of peptic ulcer disease.  The EGD was initially scheduled for 04/01/2016 but was canceled. The patient was a no-show for his follow-up appointment on 09/09/2016. He does not appear to have completed his abdominal ultrasound of the liver for hepatoma screening.  Today he states he's doing ok. Denies abdominal pain, GERD symptoms, hematochezia, melena, fever, chills, unintentional weight loss, yellowing of skin/eyes, darkened urine, acute episodic confusion, tremors. Denies chest pain, dyspnea, dizziness, lightheadedness, syncope, near syncope. Denies any other upper or lower GI symptoms.  Past Medical History:  Diagnosis Date  . Diabetes mellitus without complication (HCC)    borderline  . Erectile dysfunction   . GERD (gastroesophageal reflux disease)   . Headache(784.0)    "Headache, I dont think they are migranes"  . Hepatitis C    s/p Harvoni, completed 06/2015 by Dr. Linus Salmons  . History of kidney stones    history of  kidney stones  . History of migraine    takes Topamax daily  . Hypertension    takes Hydralazine,Maxzide,Clonidine,and Amlodipine daily  . Hypertension    also takes Metoprolol daily  . Nausea    takes Zofran daily  . Seizures ([facility])    off Dilantin daily.  last one was when he had a stroke.  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 Sleep apnea    CPAP- tries to do it everyday.  . Stroke ([place])    .  Short term memory loss.; left sided weakness  . Tobacco user     Past Surgical History:  Procedure Laterality Date  . CHOLECYSTECTOMY N/A 03/09/2013   Procedure: LAPAROSCOPIC CHOLECYSTECTOMY WITH INTRAOPERATIVE CHOLANGIOGRAM;  Surgeon: Imogene Burn. Georgette Dover, MD;  Location: [place];  Service: General;  Laterality: N/A;  . COLONOSCOPY  06/2014   Dr. Olevia Perches: Three sessile polyps were found in the rectum x2 and descending. tubular adenomas. colonoscopy in 06/2019  . ESOPHAGOGASTRODUODENOSCOPY (EGD) WITH PROPOFOL N/A 08/21/2015   Dr. Oneida Alar: normal esophagus, non-bleeding gastric ulcers/gastritis (H.pylori)   . UMBILICAL HERNIA REPAIR N/A 03/09/2013   Procedure: HERNIA REPAIR UMBILICAL ADULT;  Surgeon: Imogene Burn. Georgette Dover, MD;  Location: [place] [facility];  Service: General;  Laterality: N/A;    Current Outpatient Prescriptions  Medication Sig Dispense Refill  . amLODipine (NORVASC) 10 MG tablet Take 1 tablet (10 mg total) by mouth daily. 90 tablet 3  . aspirin EC 81 MG tablet Take 81 mg by mouth daily.    Marland Kitchen esomeprazole (NEXIUM) 40 MG capsule Take 1 capsule (40  mg total) by mouth daily. 90 capsule 3  . fish oil-omega-3 fatty acids 1000 MG capsule Take 2 g by mouth every other day.     . gabapentin (NEURONTIN) 300 MG capsule TAKE ONE CAPSULE BY MOUTH AT BEDTIME 30 capsule 0  . glipiZIDE (GLUCOTROL) 5 MG tablet TAKE ONE TABLET BY MOUTH ONCE DAILY BEFORE BREAKFAST 30 tablet 4  . hydrALAZINE (APRESOLINE) 25 MG tablet Take 1 tablet (25 mg total) by mouth 3 (three) times daily. 270 tablet 3  . metoprolol  succinate (TOPROL-XL) 50 MG 24 hr tablet Take 1 tablet (50 mg total) by mouth daily. Take with or immediately following a meal. 30 tablet 5  . metoprolol succinate (TOPROL-XL) 50 MG 24 hr tablet TAKE TWO TABLETS BY MOUTH ONCE DAILY WITH, OR IMMEDIATELY FOLLOWING A MEAL 60 tablet 2  . sildenafil (REVATIO) 20 MG tablet Take 2 to 5 tablets once daily as needed for erectile dysfunction 20 tablet 3  . topiramate (TOPAMAX) 25 MG tablet Take 1 tablet (25 mg total) by mouth 2 (two) times daily. 180 tablet 3  . traMADol (ULTRAM) 50 MG tablet Take 1 tablet (50 mg total) by mouth every 8 (eight) hours as needed. 30 tablet 0   No current facility-administered medications for this visit.     Allergies as of 01/28/2017  . (No Known Allergies)    Family History  Problem Relation Age of Onset  . Heart disease Maternal Grandmother   . Heart disease Maternal Grandfather   . Heart disease Paternal Grandmother   . Heart disease Paternal Grandfather   . Cancer Cousin   . Hypertension Mother   . Colon cancer Maternal Uncle   . Rectal cancer Neg Hx   . Stomach cancer Neg Hx     Social History   Social History  . Marital status: Single    Spouse name: N/A  . Number of children: N/A  . Years of education: N/A   Social History Main Topics  . Smoking status: Current Every Day Smoker    Packs/day: 0.20    Years: 25.00    Types: Cigarettes    Start date: 05/26/1978  . Smokeless tobacco: Never Used     Comment: slowing down  . Alcohol use 1.8 oz/week    2 Cans of beer, 1 Standard drinks or equivalent per week     Comment: One beer daily  . Drug use: No  . Sexual activity: Not Asked   Other Topics Concern  . None   Social History Narrative  . None    Review of Systems: Complete ROS negative except as per HPI.   Physical Exam: BP (!) 150/103   Pulse 95   Temp 98.3 F (36.8 C) (Oral)   Ht 5\' 7"  (1.702 m)   Wt 195 lb 6.4 oz (88.6 kg)   BMI 30.60 kg/m  General:   Obese male. Alert and  oriented. Pleasant and cooperative. Well-nourished and well-developed.  Eyes:  Without icterus, sclera clear and conjunctiva pink.  Ears:  Normal auditory acuity. Cardiovascular:  S1, S2 present without murmurs appreciated. Extremities without clubbing or edema. Respiratory:  Clear to auscultation bilaterally. No wheezes, rales, or rhonchi. No distress.  Gastrointestinal:  +BS, rounded but soft, non-tender and non-distended. No HSM noted. No guarding or rebound. Rectal:  Deferred  Musculoskalatal:  Symmetrical without gross deformities. Neurologic:  Alert and oriented x4;  grossly normal neurologically. Psych:  Alert and cooperative. Normal mood and affect. Heme/Lymph/Immune: No excessive bruising noted.  01/28/2017 11:01 AM   Disclaimer: This note was dictated with voice recognition software. Similar sounding words can inadvertently be transcribed and may not be corrected upon review.

## 2017-01-28 NOTE — Telephone Encounter (Signed)
Called and informed pt of pre-op appt 02/13/17 at 11:00am. Letter also mailed.

## 2017-01-28 NOTE — Assessment & Plan Note (Signed)
The patient is overdue for surveillance status post peptic ulcer disease and H. pylori with treatment. The patient is overdue for his surveillance as he canceled his last EGD. He was asymptomatic when his peptic ulcer was discovered and given cirrhosis history and technically increased risk of bleeding I still feel it is worthwhile for surveillance to confirm H. pylori has been eradicated and gastric ulcer no longer present. We'll proceed as previously recommended.  Proceed with EGD on propofol/MAC with Dr. Oneida Alar in near future: the risks, benefits, and alternatives have been discussed with the patient in detail. The patient states understanding and desires to proceed.  The patient is currently on Neurontin, tramadol. No other anticoagulants, anxiolytics, chronic pain medications, or antidepressants. History of alcohol abuse. Currently drinks 1 beer a day. We will proceed with the procedure on propofol/MAC to promote adequate sedation as was required for his last procedure.

## 2017-01-28 NOTE — Progress Notes (Signed)
cc'ed to pcp °

## 2017-01-28 NOTE — Assessment & Plan Note (Addendum)
Hepatitis C cirrhosis status post sustained viral response/eradication. He is generally asymptomatic from a hepatic and GI standpoint. He does have cirrhosis. He is overdue for labs and hepatoma screening. I discussed the importance of having this completed with him and he agrees. At this point we will check CBC, CMP, PT/INR, AFP. I will also check a right upper quadrant ultrasound for hepatoma screening. Return for follow-up in 6 months for routine liver care.

## 2017-01-28 NOTE — Patient Instructions (Signed)
1. Have your labs completed as soon as you're able to. 2. We will schedule your ultrasound of your liver for you. 3. We will schedule your upper endoscopy for you. 4. Return for follow-up in 6 months. 5. Call us if you have any essence or concerns. 6. Call us if you have any worsening or recurrent symptoms.

## 2017-01-29 ENCOUNTER — Telehealth: Payer: Self-pay

## 2017-01-29 NOTE — Telephone Encounter (Signed)
US abdomen ruq scheduled for 02/03/17 at 10:30am, pt to arrive at 10:15am. NPO after midnight before test. Called and informed pt. Letter also mailed.

## 2017-01-29 NOTE — Patient Instructions (Signed)
PA info for [place] abd ruq submitted via [employer]. Case approved. PA# [id number].

## 2017-02-03 ENCOUNTER — Ambulatory Visit (HOSPITAL_COMMUNITY): Admission: RE | Admit: 2017-02-03 | Payer: Medicaid Other | Source: Ambulatory Visit

## 2017-02-09 ENCOUNTER — Ambulatory Visit (HOSPITAL_COMMUNITY)
Admission: RE | Admit: 2017-02-09 | Discharge: 2017-02-09 | Disposition: A | Discharge status: Home / Self Care | Payer: Medicaid Other | Source: Ambulatory Visit | Attending: Nurse Practitioner | Admitting: Nurse Practitioner

## 2017-02-09 DIAGNOSIS — K279 Peptic ulcer, site unspecified, unspecified as acute or chronic, without hemorrhage or perforation: Secondary | ICD-10-CM

## 2017-02-09 DIAGNOSIS — K7460 Unspecified cirrhosis of liver: Secondary | ICD-10-CM

## 2017-02-09 DIAGNOSIS — R932 Abnormal findings on diagnostic imaging of liver and biliary tract: Secondary | ICD-10-CM | POA: Insufficient documentation

## 2017-02-10 NOTE — Patient Instructions (Signed)
Philip [name]  02/10/2017     @PREFPERIOPPHARMACY @   Your procedure is scheduled on  02/17/2017 .  Report to [name] at  1130  A.M.  Call this number if you have problems the morning of surgery:  [phone]   Remember:  Do not eat food or drink liquids after midnight.  Take these medicines the morning of surgery with A SIP OF WATER  Norvasc, nexium, neurontin, metoprolol, topamax, ultram.   Do not wear jewelry, make-up or nail polish.  Do not wear lotions, powders, or perfumes, or deoderant.  Do not shave 48 hours prior to surgery.  Men may shave face and neck.  Do not bring valuables to the hospital.  [facility] is not responsible for any belongings or valuables.  Contacts, dentures or bridgework may not be worn into surgery.  Leave your suitcase in the car.  After surgery it may be brought to your room.  For patients admitted to the hospital, discharge time will be determined by your treatment team.  Patients discharged the day of surgery will not be allowed to drive home.   Name and phone number of your driver:   family Special instructions:  Follow the diet instructions given to you by Dr Oneida Alar office.  Please read over the following fact sheets that you were given. Anesthesia Post-op Instructions and Care and Recovery After Surgery       Esophagogastroduodenoscopy Esophagogastroduodenoscopy (EGD) is a procedure to examine the lining of the esophagus, stomach, and first part of the small intestine (duodenum). This procedure is done to check for problems such as inflammation, bleeding, ulcers, or growths. During this procedure, a long, flexible, lighted tube with a camera attached (endoscope) is inserted down the throat. Tell a health care provider about:  Any allergies you have.  All medicines you are taking, including vitamins, herbs, eye drops, creams, and over-the-counter medicines.  Any problems you or family members have had with  anesthetic medicines.  Any blood disorders you have.  Any surgeries you have had.  Any medical conditions you have.  Whether you are pregnant or may be pregnant. What are the risks? Generally, this is a safe procedure. However, problems may occur, including:  Infection.  Bleeding.  A tear (perforation) in the esophagus, stomach, or duodenum.  Trouble breathing.  Excessive sweating.  Spasms of the larynx.  A slowed heartbeat.  Low blood pressure.  What happens before the procedure?  Follow instructions from your health care provider about eating or drinking restrictions.  Ask your health care provider about: ? Changing or stopping your regular medicines. This is especially important if you are taking diabetes medicines or blood thinners. ? Taking medicines such as aspirin and ibuprofen. These medicines can thin your blood. Do not take these medicines before your procedure if your health care provider instructs you not to.  Plan to have someone take you home after the procedure.  If you wear dentures, be ready to remove them before the procedure. What happens during the procedure?  To reduce your risk of infection, your health care team will wash or sanitize their hands.  An IV tube will be put in a vein in your hand or arm. You will get medicines and fluids through this tube.  You will be given one or more of the following: ? A medicine to help you relax (sedative). ? A medicine to numb the area (local anesthetic). This medicine may  be sprayed into your throat. It will make you feel more comfortable and keep you from gagging or coughing during the procedure. ? A medicine for pain.  A mouth guard may be placed in your mouth to protect your teeth and to keep you from biting on the endoscope.  You will be asked to lie on your left side.  The endoscope will be lowered down your throat into your esophagus, stomach, and duodenum.  Air will be put into the endoscope.  This will help your health care provider see better.  The lining of your esophagus, stomach, and duodenum will be examined.  Your health care provider may: ? Take a tissue sample so it can be looked at in a lab (biopsy). ? Remove growths. ? Remove objects (foreign bodies) that are stuck. ? Treat any bleeding with medicines or other devices that stop tissue from bleeding. ? Widen (dilate) or stretch narrowed areas of your esophagus and stomach.  The endoscope will be taken out. The procedure may vary among health care providers and hospitals. What happens after the procedure?  Your blood pressure, heart rate, breathing rate, and blood oxygen level will be monitored often until the medicines you were given have worn off.  Do not eat or drink anything until the numbing medicine has worn off and your gag reflex has returned. This information is not intended to replace advice given to you by your health care provider. Make sure you discuss any questions you have with your health care provider. Document Released: 09/12/2004 Document Revised: 10/18/2015 Document Reviewed: 04/05/2015 Elsevier Interactive Patient Education  2018 [employer]. Esophagogastroduodenoscopy, Care After Refer to this sheet in the next few weeks. These instructions provide you with information about caring for yourself after your procedure. Your health care provider may also give you more specific instructions. Your treatment has been planned according to current medical practices, but problems sometimes occur. Call your health care provider if you have any problems or questions after your procedure. What can I expect after the procedure? After the procedure, it is common to have:  A sore throat.  Nausea.  Bloating.  Dizziness.  Fatigue.  Follow these instructions at home:  Do not eat or drink anything until the numbing medicine (local anesthetic) has worn off and your gag reflex has returned. You will know  that the local anesthetic has worn off when you can swallow comfortably.  Do not drive for 24 hours if you received a medicine to help you relax (sedative).  If your health care provider took a tissue sample for testing during the procedure, make sure to get your test results. This is your responsibility. Ask your health care provider or the department performing the test when your results will be ready.  Keep all follow-up visits as told by your health care provider. This is important. Contact a health care provider if:  You cannot stop coughing.  You are not urinating.  You are urinating less than usual. Get help right away if:  You have trouble swallowing.  You cannot eat or drink.  You have throat or chest pain that gets worse.  You are dizzy or light-headed.  You faint.  You have nausea or vomiting.  You have chills.  You have a fever.  You have severe abdominal pain.  You have black, tarry, or bloody stools. This information is not intended to replace advice given to you by your health care provider. Make sure you discuss any questions you have with  your health care provider. Document Released: 04/28/2012 Document Revised: 10/18/2015 Document Reviewed: 04/05/2015 Elsevier Interactive Patient Education  2018 [place] Anesthesia is a term that refers to techniques, procedures, and medicines that help a person stay safe and comfortable during a medical procedure. Monitored anesthesia care, or sedation, is one type of anesthesia. Your anesthesia specialist may recommend sedation if you will be having a procedure that does not require you to be unconscious, such as:  Cataract surgery.  A dental procedure.  A biopsy.  A colonoscopy.  During the procedure, you may receive a medicine to help you relax (sedative). There are three levels of sedation:  Mild sedation. At this level, you may feel awake and relaxed. You will be able to  follow directions.  Moderate sedation. At this level, you will be sleepy. You may not remember the procedure.  Deep sedation. At this level, you will be asleep. You will not remember the procedure.  The more medicine you are given, the deeper your level of sedation will be. Depending on how you respond to the procedure, the anesthesia specialist may change your level of sedation or the type of anesthesia to fit your needs. An anesthesia specialist will monitor you closely during the procedure. Let your health care provider know about:  Any allergies you have.  All medicines you are taking, including vitamins, herbs, eye drops, creams, and over-the-counter medicines.  Any use of steroids (by mouth or as a cream).  Any problems you or family members have had with sedatives and anesthetic medicines.  Any blood disorders you have.  Any surgeries you have had.  Any medical conditions you have, such as sleep apnea.  Whether you are pregnant or may be pregnant.  Any use of cigarettes, alcohol, or street drugs. What are the risks? Generally, this is a safe procedure. However, problems may occur, including:  Getting too much medicine (oversedation).  Nausea.  Allergic reaction to medicines.  Trouble breathing. If this happens, a breathing tube may be used to help with breathing. It will be removed when you are awake and breathing on your own.  Heart trouble.  Lung trouble.  Before the procedure Staying hydrated Follow instructions from your health care provider about hydration, which may include:  Up to 2 hours before the procedure - you may continue to drink clear liquids, such as water, clear fruit juice, black coffee, and plain tea.  Eating and drinking restrictions Follow instructions from your health care provider about eating and drinking, which may include:  8 hours before the procedure - stop eating heavy meals or foods such as meat, fried foods, or fatty foods.  6  hours before the procedure - stop eating light meals or foods, such as toast or cereal.  6 hours before the procedure - stop drinking milk or drinks that contain milk.  2 hours before the procedure - stop drinking clear liquids.  Medicines Ask your health care provider about:  Changing or stopping your regular medicines. This is especially important if you are taking diabetes medicines or blood thinners.  Taking medicines such as aspirin and ibuprofen. These medicines can thin your blood. Do not take these medicines before your procedure if your health care provider instructs you not to.  Tests and exams  You will have a physical exam.  You may have blood tests done to show: ? How well your kidneys and liver are working. ? How well your blood can clot.  General instructions  Plan to have someone take you home from the hospital or clinic.  If you will be going home right after the procedure, plan to have someone with you for 24 hours.  What happens during the procedure?  Your blood pressure, heart rate, breathing, level of pain and overall condition will be monitored.  An IV tube will be inserted into one of your veins.  Your anesthesia specialist will give you medicines as needed to keep you comfortable during the procedure. This may mean changing the level of sedation.  The procedure will be performed. After the procedure  Your blood pressure, heart rate, breathing rate, and blood oxygen level will be monitored until the medicines you were given have worn off.  Do not drive for 24 hours if you received a sedative.  You may: ? Feel sleepy, clumsy, or nauseous. ? Feel forgetful about what happened after the procedure. ? Have a sore throat if you had a breathing tube during the procedure. ? Vomit. This information is not intended to replace advice given to you by your health care provider. Make sure you discuss any questions you have with your health care  provider. Document Released: 02/05/2005 Document Revised: 10/19/2015 Document Reviewed: 09/02/2015 Elsevier Interactive Patient Education  2018 [place], Care After These instructions provide you with information about caring for yourself after your procedure. Your health care provider may also give you more specific instructions. Your treatment has been planned according to current medical practices, but problems sometimes occur. Call your health care provider if you have any problems or questions after your procedure. What can I expect after the procedure? After your procedure, it is common to:  Feel sleepy for several hours.  Feel clumsy and have poor balance for several hours.  Feel forgetful about what happened after the procedure.  Have poor judgment for several hours.  Feel nauseous or vomit.  Have a sore throat if you had a breathing tube during the procedure.  Follow these instructions at home: For at least 24 hours after the procedure:   Do not: ? Participate in activities in which you could fall or become injured. ? Drive. ? Use heavy machinery. ? Drink alcohol. ? Take sleeping pills or medicines that cause drowsiness. ? Make important decisions or sign legal documents. ? Take care of children on your own.  Rest. Eating and drinking  Follow the diet that is recommended by your health care provider.  If you vomit, drink water, juice, or soup when you can drink without vomiting.  Make sure you have little or no nausea before eating solid foods. General instructions  Have a responsible adult stay with you until you are awake and alert.  Take over-the-counter and prescription medicines only as told by your health care provider.  If you smoke, do not smoke without supervision.  Keep all follow-up visits as told by your health care provider. This is important. Contact a health care provider if:  You keep feeling nauseous or you  keep vomiting.  You feel light-headed.  You develop a rash.  You have a fever. Get help right away if:  You have trouble breathing. This information is not intended to replace advice given to you by your health care provider. Make sure you discuss any questions you have with your health care provider. Document Released: 09/02/2015 Document Revised: 01/02/2016 Document Reviewed: 09/02/2015 Elsevier Interactive Patient Education  [employer].

## 2017-02-12 NOTE — Progress Notes (Signed)
Pt is aware.  

## 2017-02-13 ENCOUNTER — Encounter (HOSPITAL_COMMUNITY)
Admission: RE | Admit: 2017-02-13 | Discharge: 2017-02-13 | Disposition: A | Discharge status: Home / Self Care | Payer: Medicaid Other | Source: Ambulatory Visit | Attending: Gastroenterology | Admitting: Gastroenterology

## 2017-02-20 ENCOUNTER — Other Ambulatory Visit: Payer: Self-pay | Admitting: Family Medicine

## 2017-03-02 NOTE — Patient Instructions (Signed)
Philip [name]  03/02/2017     @PREFPERIOPPHARMACY @   Your procedure is scheduled on 03/10/2017.  Report to [name] at 6:45 A.M.  Call this number if you have problems the morning of surgery:  [phone]   Remember:  Do not eat food or drink liquids after midnight.  Take these medicines the morning of surgery with A SIP OF WATER Amlodipine, Nexium, Gabapentin, Metoprolol, Topamax, Ultram if needed   Do not wear jewelry, make-up or nail polish.  Do not wear lotions, powders, or perfumes, or deoderant.  Do not shave 48 hours prior to surgery.  Men may shave face and neck.  Do not bring valuables to the hospital.  [facility] is not responsible for any belongings or valuables.  Contacts, dentures or bridgework may not be worn into surgery.  Leave your suitcase in the car.  After surgery it may be brought to your room.  For patients admitted to the hospital, discharge time will be determined by your treatment team.  Patients discharged the day of surgery will not be allowed to drive home.    Please read over the following fact sheets that you were given. Anesthesia Post-op Instructions     PATIENT INSTRUCTIONS POST-ANESTHESIA  IMMEDIATELY FOLLOWING SURGERY:  Do not drive or operate machinery for the first twenty four hours after surgery.  Do not make any important decisions for twenty four hours after surgery or while taking narcotic pain medications or sedatives.  If you develop intractable nausea and vomiting or a severe headache please notify your doctor immediately.  FOLLOW-UP:  Please make an appointment with your surgeon as instructed. You do not need to follow up with anesthesia unless specifically instructed to do so.  WOUND CARE INSTRUCTIONS (if applicable):  Keep a dry clean dressing on the anesthesia/puncture wound site if there is drainage.  Once the wound has quit draining you may leave it open to air.  Generally you should leave the bandage intact for twenty  four hours unless there is drainage.  If the epidural site drains for more than 36-48 hours please call the anesthesia department.  QUESTIONS?:  Please feel free to call your physician or the hospital operator if you have any questions, and they will be happy to assist you.      Esophagogastroduodenoscopy Esophagogastroduodenoscopy (EGD) is a procedure to examine the lining of the esophagus, stomach, and first part of the small intestine (duodenum). This procedure is done to check for problems such as inflammation, bleeding, ulcers, or growths. During this procedure, a long, flexible, lighted tube with a camera attached (endoscope) is inserted down the throat. Tell a health care provider about:  Any allergies you have.  All medicines you are taking, including vitamins, herbs, eye drops, creams, and over-the-counter medicines.  Any problems you or family members have had with anesthetic medicines.  Any blood disorders you have.  Any surgeries you have had.  Any medical conditions you have.  Whether you are pregnant or may be pregnant. What are the risks? Generally, this is a safe procedure. However, problems may occur, including:  Infection.  Bleeding.  A tear (perforation) in the esophagus, stomach, or duodenum.  Trouble breathing.  Excessive sweating.  Spasms of the larynx.  A slowed heartbeat.  Low blood pressure.  What happens before the procedure?  Follow instructions from your health care provider about eating or drinking restrictions.  Ask your health care provider about: ? Changing or stopping your regular medicines. This is  especially important if you are taking diabetes medicines or blood thinners. ? Taking medicines such as aspirin and ibuprofen. These medicines can thin your blood. Do not take these medicines before your procedure if your health care provider instructs you not to.  Plan to have someone take you home after the procedure.  If you wear  dentures, be ready to remove them before the procedure. What happens during the procedure?  To reduce your risk of infection, your health care team will wash or sanitize their hands.  An IV tube will be put in a vein in your hand or arm. You will get medicines and fluids through this tube.  You will be given one or more of the following: ? A medicine to help you relax (sedative). ? A medicine to numb the area (local anesthetic). This medicine may be sprayed into your throat. It will make you feel more comfortable and keep you from gagging or coughing during the procedure. ? A medicine for pain.  A mouth guard may be placed in your mouth to protect your teeth and to keep you from biting on the endoscope.  You will be asked to lie on your left side.  The endoscope will be lowered down your throat into your esophagus, stomach, and duodenum.  Air will be put into the endoscope. This will help your health care provider see better.  The lining of your esophagus, stomach, and duodenum will be examined.  Your health care provider may: ? Take a tissue sample so it can be looked at in a lab (biopsy). ? Remove growths. ? Remove objects (foreign bodies) that are stuck. ? Treat any bleeding with medicines or other devices that stop tissue from bleeding. ? Widen (dilate) or stretch narrowed areas of your esophagus and stomach.  The endoscope will be taken out. The procedure may vary among health care providers and hospitals. What happens after the procedure?  Your blood pressure, heart rate, breathing rate, and blood oxygen level will be monitored often until the medicines you were given have worn off.  Do not eat or drink anything until the numbing medicine has worn off and your gag reflex has returned. This information is not intended to replace advice given to you by your health care provider. Make sure you discuss any questions you have with your health care provider. Document Released:  09/12/2004 Document Revised: 10/18/2015 Document Reviewed: 04/05/2015 Elsevier Interactive Patient Education  [employer].

## 2017-03-05 ENCOUNTER — Encounter (HOSPITAL_COMMUNITY)
Admission: RE | Admit: 2017-03-05 | Discharge: 2017-03-05 | Disposition: A | Discharge status: Home / Self Care | Payer: Medicaid Other | Source: Ambulatory Visit | Attending: Gastroenterology | Admitting: Gastroenterology

## 2017-03-06 ENCOUNTER — Other Ambulatory Visit: Payer: Self-pay

## 2017-03-06 ENCOUNTER — Encounter (HOSPITAL_COMMUNITY)
Admission: RE | Admit: 2017-03-06 | Discharge: 2017-03-06 | Disposition: A | Discharge status: Home / Self Care | Payer: Medicaid Other | Source: Ambulatory Visit | Attending: Gastroenterology | Admitting: Gastroenterology

## 2017-03-06 ENCOUNTER — Encounter (HOSPITAL_COMMUNITY): Payer: Self-pay

## 2017-03-06 DIAGNOSIS — Z01812 Encounter for preprocedural laboratory examination: Secondary | ICD-10-CM | POA: Diagnosis not present

## 2017-03-06 DIAGNOSIS — Z01818 Encounter for other preprocedural examination: Secondary | ICD-10-CM | POA: Diagnosis not present

## 2017-03-06 DIAGNOSIS — R9431 Abnormal electrocardiogram [ECG] [EKG]: Secondary | ICD-10-CM | POA: Diagnosis not present

## 2017-03-06 LAB — CBC WITH DIFFERENTIAL/PLATELET
BASOS PCT: 1 %
Basophils Absolute: 0 10*3/uL (ref 0.0–0.1)
EOS ABS: 0.5 10*3/uL (ref 0.0–0.7)
EOS PCT: 8 %
HEMATOCRIT: 44.6 % (ref 39.0–52.0)
Hemoglobin: 15.5 g/dL (ref 13.0–17.0)
Lymphocytes Relative: 22 %
Lymphs Abs: 1.2 10*3/uL (ref 0.7–4.0)
MCH: 31.4 pg (ref 26.0–34.0)
MCHC: 34.8 g/dL (ref 30.0–36.0)
MCV: 90.5 fL (ref 78.0–100.0)
MONO ABS: 0.6 10*3/uL (ref 0.1–1.0)
MONOS PCT: 11 %
Neutro Abs: 3.2 10*3/uL (ref 1.7–7.7)
Neutrophils Relative %: 59 %
Platelets: 109 10*3/uL — ABNORMAL LOW (ref 150–400)
RBC: 4.93 MIL/uL (ref 4.22–5.81)
RDW: 13.9 % (ref 11.5–15.5)
WBC: 5.5 10*3/uL (ref 4.0–10.5)

## 2017-03-06 LAB — COMPREHENSIVE METABOLIC PANEL
ALBUMIN: 3.7 g/dL (ref 3.5–5.0)
ALT: 52 U/L (ref 17–63)
ANION GAP: 11 (ref 5–15)
AST: 97 U/L — ABNORMAL HIGH (ref 15–41)
Alkaline Phosphatase: 103 U/L (ref 38–126)
BILIRUBIN TOTAL: 1 mg/dL (ref 0.3–1.2)
BUN: 8 mg/dL (ref 6–20)
CO2: 24 mmol/L (ref 22–32)
Calcium: 9 mg/dL (ref 8.9–10.3)
Chloride: 100 mmol/L — ABNORMAL LOW (ref 101–111)
Creatinine, Ser: 1.24 mg/dL (ref 0.61–1.24)
GFR calc Af Amer: >60 mL/min (ref >60)
GFR calc non Af Amer: >60 mL/min (ref >60)
GLUCOSE: 152 mg/dL — AB (ref 65–99)
POTASSIUM: 3.5 mmol/L (ref 3.5–5.1)
SODIUM: 135 mmol/L (ref 135–145)
TOTAL PROTEIN: 7.7 g/dL (ref 6.5–8.1)

## 2017-03-06 LAB — PROTIME-INR
INR: 0.99
Prothrombin Time: 13 seconds (ref 11.4–15.2)

## 2017-03-06 LAB — HEMOGLOBIN A1C
HEMOGLOBIN A1C: 5.4 % (ref 4.8–5.6)
Mean Plasma Glucose: 108.28 mg/dL

## 2017-03-07 LAB — AFP TUMOR MARKER: AFP, Serum, Tumor Marker: 9 ng/mL — ABNORMAL HIGH (ref 0.0–8.3)

## 2017-03-10 ENCOUNTER — Ambulatory Visit (HOSPITAL_COMMUNITY): Payer: Medicaid Other | Admitting: Anesthesiology

## 2017-03-10 ENCOUNTER — Ambulatory Visit (HOSPITAL_COMMUNITY)
Admission: RE | Admit: 2017-03-10 | Discharge: 2017-03-10 | Disposition: A | Discharge status: Home / Self Care | Payer: Medicaid Other | Source: Ambulatory Visit | Attending: Gastroenterology | Admitting: Gastroenterology

## 2017-03-10 ENCOUNTER — Encounter (HOSPITAL_COMMUNITY)
Admission: RE | Disposition: A | Discharge status: Home / Self Care | Payer: Self-pay | Source: Ambulatory Visit | Attending: Gastroenterology

## 2017-03-10 ENCOUNTER — Encounter (HOSPITAL_COMMUNITY): Payer: Self-pay | Admitting: *Deleted

## 2017-03-10 DIAGNOSIS — Z8249 Family history of ischemic heart disease and other diseases of the circulatory system: Secondary | ICD-10-CM | POA: Insufficient documentation

## 2017-03-10 DIAGNOSIS — K2960 Other gastritis without bleeding: Secondary | ICD-10-CM | POA: Diagnosis not present

## 2017-03-10 DIAGNOSIS — Z87442 Personal history of urinary calculi: Secondary | ICD-10-CM | POA: Insufficient documentation

## 2017-03-10 DIAGNOSIS — Z7984 Long term (current) use of oral hypoglycemic drugs: Secondary | ICD-10-CM | POA: Diagnosis not present

## 2017-03-10 DIAGNOSIS — K2970 Gastritis, unspecified, without bleeding: Secondary | ICD-10-CM | POA: Diagnosis not present

## 2017-03-10 DIAGNOSIS — B1920 Unspecified viral hepatitis C without hepatic coma: Secondary | ICD-10-CM | POA: Diagnosis not present

## 2017-03-10 DIAGNOSIS — E119 Type 2 diabetes mellitus without complications: Secondary | ICD-10-CM | POA: Insufficient documentation

## 2017-03-10 DIAGNOSIS — I69354 Hemiplegia and hemiparesis following cerebral infarction affecting left non-dominant side: Secondary | ICD-10-CM | POA: Insufficient documentation

## 2017-03-10 DIAGNOSIS — I10 Essential (primary) hypertension: Secondary | ICD-10-CM | POA: Insufficient documentation

## 2017-03-10 DIAGNOSIS — Z8711 Personal history of peptic ulcer disease: Secondary | ICD-10-CM | POA: Diagnosis not present

## 2017-03-10 DIAGNOSIS — I69311 Memory deficit following cerebral infarction: Secondary | ICD-10-CM | POA: Insufficient documentation

## 2017-03-10 DIAGNOSIS — J449 Chronic obstructive pulmonary disease, unspecified: Secondary | ICD-10-CM | POA: Diagnosis not present

## 2017-03-10 DIAGNOSIS — B9681 Helicobacter pylori [H. pylori] as the cause of diseases classified elsewhere: Secondary | ICD-10-CM | POA: Insufficient documentation

## 2017-03-10 DIAGNOSIS — K279 Peptic ulcer, site unspecified, unspecified as acute or chronic, without hemorrhage or perforation: Secondary | ICD-10-CM

## 2017-03-10 DIAGNOSIS — K222 Esophageal obstruction: Secondary | ICD-10-CM | POA: Insufficient documentation

## 2017-03-10 DIAGNOSIS — G4730 Sleep apnea, unspecified: Secondary | ICD-10-CM | POA: Insufficient documentation

## 2017-03-10 DIAGNOSIS — Z8719 Personal history of other diseases of the digestive system: Secondary | ICD-10-CM | POA: Insufficient documentation

## 2017-03-10 DIAGNOSIS — Z7982 Long term (current) use of aspirin: Secondary | ICD-10-CM | POA: Diagnosis not present

## 2017-03-10 DIAGNOSIS — Z79899 Other long term (current) drug therapy: Secondary | ICD-10-CM | POA: Diagnosis not present

## 2017-03-10 DIAGNOSIS — K2980 Duodenitis without bleeding: Secondary | ICD-10-CM | POA: Diagnosis not present

## 2017-03-10 DIAGNOSIS — F17210 Nicotine dependence, cigarettes, uncomplicated: Secondary | ICD-10-CM | POA: Insufficient documentation

## 2017-03-10 DIAGNOSIS — K219 Gastro-esophageal reflux disease without esophagitis: Secondary | ICD-10-CM | POA: Insufficient documentation

## 2017-03-10 DIAGNOSIS — K289 Gastrojejunal ulcer, unspecified as acute or chronic, without hemorrhage or perforation: Secondary | ICD-10-CM | POA: Diagnosis present

## 2017-03-10 HISTORY — PX: ESOPHAGOGASTRODUODENOSCOPY (EGD) WITH PROPOFOL: SHX5813

## 2017-03-10 HISTORY — PX: BIOPSY: SHX5522

## 2017-03-10 LAB — GLUCOSE, CAPILLARY
GLUCOSE-CAPILLARY: 129 mg/dL — AB (ref 65–99)
Glucose-Capillary: 126 mg/dL — ABNORMAL HIGH (ref 65–99)

## 2017-03-10 SURGERY — ESOPHAGOGASTRODUODENOSCOPY (EGD) WITH PROPOFOL
Anesthesia: Monitor Anesthesia Care

## 2017-03-10 MED ORDER — PROPOFOL 10 MG/ML IV BOLUS
INTRAVENOUS | Status: AC
  Filled 2017-03-10: qty 80

## 2017-03-10 MED ORDER — LABETALOL HCL 5 MG/ML IV SOLN
10.0000 mg | Freq: Once | INTRAVENOUS | Status: AC
  Administered 2017-03-10: 10 mg via INTRAVENOUS

## 2017-03-10 MED ORDER — FENTANYL CITRATE (PF) 100 MCG/2ML IJ SOLN
25.0000 ug | Freq: Once | INTRAMUSCULAR | Status: AC
  Administered 2017-03-10: 25 ug via INTRAVENOUS

## 2017-03-10 MED ORDER — CHLORHEXIDINE GLUCONATE CLOTH 2 % EX PADS
6.0000 | MEDICATED_PAD | Freq: Once | CUTANEOUS | Status: DC
Start: 2017-03-10 — End: 2017-03-10

## 2017-03-10 MED ORDER — LABETALOL HCL 5 MG/ML IV SOLN
INTRAVENOUS | Status: AC
Start: 2017-03-10 — End: 2017-03-10
  Filled 2017-03-10: qty 4

## 2017-03-10 MED ORDER — PROPOFOL 500 MG/50ML IV EMUL
INTRAVENOUS | Status: DC | PRN
  Administered 2017-03-10: 125 ug/kg/min via INTRAVENOUS

## 2017-03-10 MED ORDER — MIDAZOLAM HCL 2 MG/2ML IJ SOLN
INTRAMUSCULAR | Status: AC
  Filled 2017-03-10: qty 2

## 2017-03-10 MED ORDER — FENTANYL CITRATE (PF) 100 MCG/2ML IJ SOLN
INTRAMUSCULAR | Status: AC
  Filled 2017-03-10: qty 2

## 2017-03-10 MED ORDER — LACTATED RINGERS IV SOLN
INTRAVENOUS | Status: DC
  Administered 2017-03-10: 07:00:00 via INTRAVENOUS

## 2017-03-10 MED ORDER — LIDOCAINE VISCOUS 2 % MT SOLN
6.0000 mL | Freq: Once | OROMUCOSAL | Status: AC
  Administered 2017-03-10: 6 mL via OROMUCOSAL

## 2017-03-10 MED ORDER — CHLORHEXIDINE GLUCONATE CLOTH 2 % EX PADS: 6.0000 | MEDICATED_PAD | Freq: Once | CUTANEOUS | Status: DC

## 2017-03-10 MED ORDER — SUCCINYLCHOLINE CHLORIDE 20 MG/ML IJ SOLN
INTRAMUSCULAR | Status: AC
  Filled 2017-03-10: qty 1

## 2017-03-10 MED ORDER — MIDAZOLAM HCL 2 MG/2ML IJ SOLN
1.0000 mg | INTRAMUSCULAR | Status: AC
  Administered 2017-03-10 (×2): 2 mg via INTRAVENOUS
  Filled 2017-03-10: qty 2

## 2017-03-10 MED ORDER — ESOMEPRAZOLE MAGNESIUM 40 MG PO CPDR: DELAYED_RELEASE_CAPSULE | ORAL | 3 refills | Status: DC

## 2017-03-10 MED ORDER — LIDOCAINE VISCOUS 2 % MT SOLN
OROMUCOSAL | Status: AC
  Filled 2017-03-10: qty 15

## 2017-03-10 MED ORDER — PROPOFOL 10 MG/ML IV BOLUS
INTRAVENOUS | Status: DC | PRN
  Administered 2017-03-10 (×2): 20 mg via INTRAVENOUS

## 2017-03-10 NOTE — Discharge Instructions (Signed)
You have gastritis & DUODENITIS DUE TO YOUR USING ASPIRIN AND ALCOHOL. You have a stricture at the bottom of your esophagus. I biopsied your stomach.   If you have trouble swallowing call me and we can schedule an upper endoscopy to stretch your esophagus.   TAKE NEXIUM 30 MINUTES PRIOR TO YOUR FIRST MEAL DAILY.  FOLLOW A LOW FAT DIET. SEE INFO BELOW.  YOUR BIOPSY RESULTS WILL BE AVAILABLE IN MY CHART AFTER OCT 20 AND MY OFFICE WILL CONTACT YOU IN 10-14 DAYS WITH YOUR RESULTS.   FOLLOW UP IN 6 MOS.  UPPER ENDOSCOPY AFTER CARE Read the instructions outlined below and refer to this sheet in the next week. These discharge instructions provide you with general information on caring for yourself after you leave the hospital. While your treatment has been planned according to the most current medical practices available, unavoidable complications occasionally occur. If you have any problems or questions after discharge, call DR. Spero Gunnels, [phone].  ACTIVITY  You may resume your regular activity, but move at a slower pace for the next 24 hours.   Take frequent rest periods for the next 24 hours.   Walking will help get rid of the air and reduce the bloated feeling in your belly (abdomen).   No driving for 24 hours (because of the medicine (anesthesia) used during the test).   You may shower.   Do not sign any important legal documents or operate any machinery for 24 hours (because of the anesthesia used during the test).    NUTRITION  Drink plenty of fluids.   You may resume your normal diet as instructed by your doctor.   Begin with a light meal and progress to your normal diet. Heavy or fried foods are harder to digest and may make you feel sick to your stomach (nauseated).   Avoid alcoholic beverages for 24 hours or as instructed.    MEDICATIONS  You may resume your normal medications.   WHAT YOU CAN EXPECT TODAY  Some feelings of bloating in the abdomen.   Passage  of more gas than usual.    IF YOU HAD A BIOPSY TAKEN DURING THE UPPER ENDOSCOPY:  Eat a soft diet IF YOU HAVE NAUSEA, BLOATING, ABDOMINAL PAIN, OR VOMITING.    FINDING OUT THE RESULTS OF YOUR TEST Not all test results are available during your visit. DR. Oneida Alar WILL CALL YOU WITHIN 14 DAYS OF YOUR PROCEDUE WITH YOUR RESULTS. Do not assume everything is normal if you have not heard from DR. Otoniel Myhand, CALL HER OFFICE AT [phone].  SEEK IMMEDIATE MEDICAL ATTENTION AND CALL THE OFFICE: [phone] IF:  You have more than a spotting of blood in your stool.   Your belly is swollen (abdominal distention).   You are nauseated or vomiting.   You have a temperature over 101F.   You have abdominal pain or discomfort that is severe or gets worse throughout the day.   Gastritis/DUODENITIS  Gastritis is an inflammation (the body's way of reacting to injury and/or infection) of the stomach. DUODENITIS is an inflammation (the body's way of reacting to injury and/or infection) of the FIRST PART OF THE SMALL INTESTINES. It is often caused by bacterial (germ) infections. It can also be caused BY ASPIRIN, BC/GOODY POWDER'S, (IBUPROFEN) MOTRIN, OR ALEVE (NAPROXEN), chemicals (including alcohol), SPICY FOODS, and medications. This illness may be associated with generalized malaise (feeling tired, not well), UPPER ABDOMINAL STOMACH cramps, and fever. One common bacterial cause of gastritis is an organism known  as H. Pylori. This can be treated with antibiotics.     REFLUX   TREATMENT There are a number of non-prescription medicines used to treat reflux including: Antacids.  ZANTAC Proton-pump inhibitors:  NEXIUM  HOME CARE INSTRUCTIONS Eat 2-3 hours before going to bed.  Try to reach and maintain a healthy weight. LOSE 10-20 LBS Do not eat just a few very large meals. Instead, eat 4 TO 6 smaller meals throughout the day.  Try to identify foods and beverages that make your symptoms worse, and  avoid these.  Avoid tight clothing.  Do not exercise right after eating.   Low-Fat Diet BREADS, CEREALS, PASTA, RICE, DRIED PEAS, AND BEANS These products are high in carbohydrates and most are low in fat. Therefore, they can be increased in the diet as substitutes for fatty foods. They too, however, contain calories and should not be eaten in excess. Cereals can be eaten for snacks as well as for breakfast.  Include foods that contain fiber (fruits, vegetables, whole grains, and legumes). Research shows that fiber may lower blood cholesterol levels, especially the water-soluble fiber found in fruits, vegetables, oat products, and legumes. FRUITS AND VEGETABLES It is good to eat fruits and vegetables. Besides being sources of fiber, both are rich in vitamins and some minerals. They help you get the daily allowances of these nutrients. Fruits and vegetables can be used for snacks and desserts. MEATS Limit lean meat, chicken, [place], and fish to no more than 6 ounces per day. Beef, Pork, and Lamb Use lean cuts of beef, pork, and lamb. Lean cuts include:  Extra-lean ground beef.  Arm roast.  Sirloin tip.  Center-cut ham.  Round steak.  Loin chops.  Rump roast.  Tenderloin.  Trim all fat off the outside of meats before cooking. It is not necessary to severely decrease the intake of red meat, but lean choices should be made. Lean meat is rich in protein and contains a highly absorbable form of iron. Premenopausal women, in particular, should avoid reducing lean red meat because this could increase the risk for low red blood cells (iron-deficiency anemia).  Chicken and [place] These are good sources of protein. The fat of poultry can be reduced by removing the skin and underlying fat layers before cooking. Chicken and [place] can be substituted for lean red meat in the diet. Poultry should not be fried or covered with high-fat sauces. Fish and Shellfish Fish is a good source of protein. Shellfish  contain cholesterol, but they usually are low in saturated fatty acids. The preparation of fish is important. Like chicken and [place], they should not be fried or covered with high-fat sauces. EGGS Egg whites contain no fat or cholesterol. They can be eaten often. Try 1 to 2 egg whites instead of whole eggs in recipes or use egg substitutes that do not contain yolk.  MILK AND DAIRY PRODUCTS Use skim or 1% milk instead of 2% or whole milk. Decrease whole milk, natural, and processed cheeses. Use nonfat or low-fat (2%) cottage cheese or low-fat cheeses made from vegetable oils. Choose nonfat or low-fat (1 to 2%) yogurt. Experiment with evaporated skim milk in recipes that call for heavy cream. Substitute low-fat yogurt or low-fat cottage cheese for sour cream in dips and salad dressings. Have at least 2 servings of low-fat dairy products, such as 2 glasses of skim (or 1%) milk each day to help get your daily calcium intake.  FATS AND OILS Butterfat, lard, and beef fats are high  in saturated fat and cholesterol. These should be avoided.Vegetable fats do not contain cholesterol. AVOID coconut oil, palm oil, and palm kernel oil, WHICH are very high in saturated fats. These should be limited. These fats are often used in bakery goods, processed foods, popcorn, oils, and nondairy creamers. Vegetable shortenings and some peanut butters contain hydrogenated oils, which are also saturated fats. Read the labels on these foods and check for saturated vegetable oils.  Desirable liquid vegetable oils are corn oil, cottonseed oil, olive oil, canola oil, safflower oil, soybean oil, and sunflower oil. Peanut oil is not as good, but small amounts are acceptable. Buy a heart-healthy tub margarine that has no partially hydrogenated oils in the ingredients. AVOID Mayonnaise and salad dressings often are made from unsaturated fats.  OTHER EATING TIPS Snacks  Most sweets should be limited as snacks. They tend to be rich in  calories and fats, and their caloric content outweighs their nutritional value. Some good choices in snacks are graham crackers, melba toast, soda crackers, bagels (no egg), English muffins, fruits, and vegetables. These snacks are preferable to snack crackers, [place] fries, and chips. Popcorn should be air-popped or cooked in small amounts of liquid vegetable oil.  Desserts Eat fruit, low-fat yogurt, and fruit ices instead of pastries, cake, and cookies. Sherbet, angel food cake, gelatin dessert, frozen low-fat yogurt, or other frozen products that do not contain saturated fat (pure fruit juice bars, frozen ice pops) are also acceptable.   COOKING METHODS Choose those methods that use little or no fat. They include: Poaching.  Braising.  Steaming.  Grilling.  Baking.  Stir-frying.  Broiling.  Microwaving.  Foods can be cooked in a nonstick pan without added fat, or use a nonfat cooking spray in regular cookware. Limit fried foods and avoid frying in saturated fat. Add moisture to lean meats by using water, broth, cooking wines, and other nonfat or low-fat sauces along with the cooking methods mentioned above. Soups and stews should be chilled after cooking. The fat that forms on top after a few hours in the refrigerator should be skimmed off. When preparing meals, avoid using excess salt. Salt can contribute to raising blood pressure in some people.  EATING AWAY FROM HOME Order entres, potatoes, and vegetables without sauces or butter. When meat exceeds the size of a deck of cards (3 to 4 ounces), the rest can be taken home for another meal. Choose vegetable or fruit salads and ask for low-calorie salad dressings to be served on the side. Use dressings sparingly. Limit high-fat toppings, such as bacon, crumbled eggs, cheese, sunflower seeds, and olives. Ask for heart-healthy tub margarine instead of butter.

## 2017-03-10 NOTE — Op Note (Signed)
[facility] Patient Name: Philip [name] Procedure Date: 03/10/2017 8:14 AM MRN: [id number] Date of Birth: [birth year] Attending MD: Barney Drain MD, MD CSN: [id number] Age: 61 Admit Type: Outpatient Procedure:                Upper GI endoscopy WITH COLD FORCEPS BIOPSY Indications:              Follow-up of ulcer of the GI tract(STOMACH). PMHX:                            H PYLORI GASTRITIS(2017), DAILY ETOH USE AND ASA                            USE. ON NEXIUM. Providers:                Barney Drain MD, MD, Janeece Riggers, RN, Rosina Lowenstein,                            RN Referring MD:             Sherley Bounds. Wendi Snipes Medicines:                Propofol per Anesthesia Complications:            No immediate complications. Estimated Blood Loss:     Estimated blood loss was minimal. Procedure:                Pre-Anesthesia Assessment:                           - Prior to the procedure, a History and Physical                            was performed, and patient medications and                            allergies were reviewed. The patient's tolerance of                            previous anesthesia was also reviewed. The risks                            and benefits of the procedure and the sedation                            options and risks were discussed with the patient.                            All questions were answered, and informed consent                            was obtained. Prior Anticoagulants: The patient has                            taken aspirin. ASA Grade Assessment: II - A patient  with mild systemic disease. After reviewing the                            risks and benefits, the patient was deemed in                            satisfactory condition to undergo the procedure.                            After obtaining informed consent, the endoscope was                            passed under direct vision. Throughout the          procedure, the patient's blood pressure, pulse, and                            oxygen saturations were monitored continuously. The                            EG-[id number] ([id number]) scope was introduced through the                            mouth, and advanced to the second part of duodenum.                            The upper GI endoscopy was accomplished without                            difficulty. The patient tolerated the procedure                            well. Scope In: 8:33:39 AM Scope Out: 8:40:07 AM Total Procedure Duration: 0 hours 6 minutes 28 seconds  Findings:      One moderate (circumferential scarring or stenosis; an endoscope may       pass) benign-appearing, intrinsic stenosis was found. This measured 1.3       cm (inner diameter) and was traversed.      Diffuse moderate inflammation characterized by congestion (edema),       erosions and erythema was found in the gastric body and in the gastric       antrum. Biopsies were taken with a cold forceps for Helicobacter pylori       testing.      Localized moderate inflammation characterized by congestion (edema) and       erythema was found in the duodenal bulb.      The second portion of the duodenum was normal. Impression:               - Benign-appearing esophageal peptic stricture.                           - MODERATE EROSIVE Gastritis.                           - MILD Duodenitis. Moderate Sedation:      Per Anesthesia Care Recommendation:           -  Await pathology results.                           - Continue present medications.                           - High fiber diet and low fat diet.                           - Return to my office in 6 months.                           - Patient has a contact number available for                            emergencies. The signs and symptoms of potential                            delayed complications were discussed with the                            patient.  Return to normal activities tomorrow.                            Written discharge instructions were provided to the                            patient. Procedure Code(s):        --- Professional ---                           [phone], Esophagogastroduodenoscopy, flexible,                            transoral; with biopsy, single or multiple Diagnosis Code(s):        --- Professional ---                           K22.2, Esophageal obstruction                           K29.70, Gastritis, unspecified, without bleeding                           K29.80, Duodenitis without bleeding                           K28.9, Gastrojejunal ulcer, unspecified as acute or                            chronic, without hemorrhage or perforation CPT copyright 2016 American Medical Association. All rights reserved. The codes documented in this report are preliminary and upon coder review may  be revised to meet current compliance requirements. Barney Drain, MD Barney Drain MD, MD 03/10/2017 8:54:51 AM This report has been signed electronically. Number of Addenda: 0

## 2017-03-10 NOTE — Anesthesia Postprocedure Evaluation (Signed)
Anesthesia Post Note  Patient: Philip [name]  Procedure(s) Performed: ESOPHAGOGASTRODUODENOSCOPY (EGD) WITH PROPOFOL (N/A ) BIOPSY  Patient location during evaluation: Short Stay Anesthesia Type: MAC Level of consciousness: awake and alert Pain management: satisfactory to patient Vital Signs Assessment: post-procedure vital signs reviewed and stable Respiratory status: spontaneous breathing Cardiovascular status: stable Postop Assessment: no apparent nausea or vomiting Anesthetic complications: no     Last Vitals:  Vitals:   03/10/17 0847 03/10/17 0911  BP: (!) 138/97 (!) 143/89  Pulse: 79 72  Resp: 20 16  Temp: 36.6 C 36.7 C  SpO2: 99% 98%    Last Pain:  Vitals:   03/10/17 0911  TempSrc: Oral                 Karely Hurtado

## 2017-03-10 NOTE — Anesthesia Procedure Notes (Signed)
Procedure Name: MAC Date/Time: 03/10/2017 8:22 AM Performed by: Vista Deck Pre-anesthesia Checklist: Patient identified, Emergency Drugs available, Suction available, Timeout performed and Patient being monitored Patient Re-evaluated:Patient Re-evaluated prior to induction Oxygen Delivery Method: Non-rebreather mask

## 2017-03-10 NOTE — Anesthesia Preprocedure Evaluation (Signed)
Anesthesia Evaluation  Patient identified by MRN, date of birth, ID band Patient awake    Reviewed: Allergy & Precautions, H&P , NPO status , Patient's Chart, lab work & pertinent test results  Airway Mallampati: II  TM Distance: >3 FB Neck ROM: Full    Dental  (+) Poor Dentition   Pulmonary asthma , sleep apnea , COPD, Current Smoker,    breath sounds clear to auscultation- rhonchi       Cardiovascular hypertension, Pt. on medications  Rhythm:Regular Rate:Normal     Neuro/Psych  Headaches, Seizures -,  Short-term memory loss CVA    GI/Hepatic PUD, GERD  ,(+) Hepatitis -, C  Endo/Other  diabetes, Type 2  Renal/GU      Musculoskeletal   Abdominal   Peds  Hematology   Anesthesia Other Findings   Reproductive/Obstetrics                             Anesthesia Physical Anesthesia Plan  ASA: III  Anesthesia Plan: MAC   Post-op Pain Management:    Induction: Intravenous  PONV Risk Score and Plan:   Airway Management Planned: Simple Face Mask  Additional Equipment:   Intra-op Plan:   Post-operative Plan:   Informed Consent: I have reviewed the patients History and Physical, chart, labs and discussed the procedure including the risks, benefits and alternatives for the proposed anesthesia with the patient or authorized representative who has indicated his/her understanding and acceptance.     Plan Discussed with:   Anesthesia Plan Comments:         Anesthesia Quick Evaluation

## 2017-03-10 NOTE — Transfer of Care (Signed)
Immediate Anesthesia Transfer of Care Note  Patient: Philip [name]  Procedure(s) Performed: ESOPHAGOGASTRODUODENOSCOPY (EGD) WITH PROPOFOL (N/A ) BIOPSY  Patient Location: PACU  Anesthesia Type:MAC  Level of Consciousness: awake and patient cooperative  Airway & Oxygen Therapy: Patient Spontanous Breathing and Patient connected to nasal cannula oxygen  Post-op Assessment: Report given to RN and Post -op Vital signs reviewed and stable  Post vital signs: Reviewed and stable  Last Vitals:  Vitals:   03/10/17 0815 03/10/17 0820  BP: (!) 149/107 (!) 149/109  Resp: (!) 25 15  Temp:    SpO2: 99% 100%    Last Pain:  Vitals:   03/10/17 0634  TempSrc: Oral      Patients Stated Pain Goal: 5 (16/10/96 0454)  Complications: No apparent anesthesia complications

## 2017-03-10 NOTE — H&P (Signed)
Primary Care Physician:  Timmothy Euler, MD Primary Gastroenterologist:  Dr. Oneida Alar  Pre-Procedure History & Physical: HPI:  Philip [name] is a 61 y.o. male here for PUD/H PYLORI GASTRITIS.  Past Medical History:  Diagnosis Date  . Diabetes mellitus without complication (HCC)    borderline  . Erectile dysfunction   . GERD (gastroesophageal reflux disease)   . Headache(784.0)    "Headache, I dont think they are migranes"  . Hepatitis C    s/p Harvoni, completed 06/2015 by Dr. Linus Salmons  . History of kidney stones    history of kidney stones  . History of migraine    takes Topamax daily  . Hypertension    takes Hydralazine,Maxzide,Clonidine,and Amlodipine daily  . Hypertension    also takes Metoprolol daily  . Nausea    takes Zofran daily  . Seizures ([place])    off Dilantin daily.  last one was when he had a stroke.  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 Sleep apnea    CPAP- tries to do it everyday.  . Stroke ([place])    .  Short term memory loss.; left sided weakness  . Tobacco user     Past Surgical History:  Procedure Laterality Date  . CHOLECYSTECTOMY N/A 03/09/2013   Procedure: LAPAROSCOPIC CHOLECYSTECTOMY WITH INTRAOPERATIVE CHOLANGIOGRAM;  Surgeon: Imogene Burn. Georgette Dover, MD;  Location: [place];  Service: General;  Laterality: N/A;  . COLONOSCOPY  06/2014   Dr. Olevia Perches: Three sessile polyps were found in the rectum x2 and descending. tubular adenomas. colonoscopy in 06/2019  . ESOPHAGOGASTRODUODENOSCOPY (EGD) WITH PROPOFOL N/A 08/21/2015   Dr. Oneida Alar: normal esophagus, non-bleeding gastric ulcers/gastritis (H.pylori)   . UMBILICAL HERNIA REPAIR N/A 03/09/2013   Procedure: HERNIA REPAIR UMBILICAL ADULT;  Surgeon: Imogene Burn. Georgette Dover, MD;  Location: [place];  Service: General;  Laterality: N/A;    Prior to Admission medications   Medication Sig Start Date End Date Taking? Authorizing Provider  amLODipine (NORVASC) 10 MG tablet Take 1 tablet (10 mg total) by mouth daily. 07/08/16   Yes Timmothy Euler, MD  aspirin EC 81 MG tablet Take 81 mg by mouth daily.   Yes [provider]  esomeprazole (NEXIUM) 40 MG capsule Take 1 capsule (40 mg total) by mouth daily. 07/08/16  Yes Timmothy Euler, MD  fish oil-omega-3 fatty acids 1000 MG capsule Take 2 g by mouth every other day.    Yes [provider]  gabapentin (NEURONTIN) 300 MG capsule TAKE 1 CAPSULE BY MOUTH ONCE DAILY AT BEDTIME 02/23/17  Yes Timmothy Euler, MD  glipiZIDE (GLUCOTROL) 5 MG tablet TAKE ONE TABLET BY MOUTH ONCE DAILY BEFORE BREAKFAST 08/25/16  Yes Timmothy Euler, MD  hydrALAZINE (APRESOLINE) 25 MG tablet Take 1 tablet (25 mg total) by mouth 3 (three) times daily. 07/08/16  Yes Timmothy Euler, MD  metoprolol succinate (TOPROL-XL) 50 MG 24 hr tablet Take 1 tablet (50 mg total) by mouth daily. Take with or immediately following a meal. 06/19/15  Yes Wardell Honour, MD  topiramate (TOPAMAX) 25 MG tablet Take 1 tablet (25 mg total) by mouth 2 (two) times daily. 07/08/16  Yes Timmothy Euler, MD  metoprolol succinate (TOPROL-XL) 50 MG 24 hr tablet TAKE TWO TABLETS BY MOUTH ONCE DAILY WITH, OR IMMEDIATELY FOLLOWING A MEAL 09/30/16   Timmothy Euler, MD  sildenafil (REVATIO) 20 MG tablet Take 2 to 5 tablets once daily as needed for erectile dysfunction Patient not  taking: Reported on 03/06/2017 07/08/16   Timmothy Euler, MD  traMADol (ULTRAM) 50 MG tablet Take 1 tablet (50 mg total) by mouth every 8 (eight) hours as needed. Patient not taking: Reported on 03/06/2017 07/08/16   Timmothy Euler, MD    Allergies as of 01/28/2017  . (No Known Allergies)    Family History  Problem Relation Age of Onset  . Heart disease Maternal Grandmother   . Heart disease Maternal Grandfather   . Heart disease Paternal Grandmother   . Heart disease Paternal Grandfather   . Cancer Cousin   . Hypertension Mother   . Colon cancer Maternal Uncle   . Rectal cancer Neg Hx   . Stomach cancer Neg  Hx     Social History   Social History  . Marital status: Single    Spouse name: N/A  . Number of children: N/A  . Years of education: N/A   Occupational History  . Not on file.   Social History Main Topics  . Smoking status: Current Every Day Smoker    Packs/day: 0.20    Years: 25.00    Types: Cigarettes    Start date: 05/26/1978  . Smokeless tobacco: Never Used     Comment: slowing down  . Alcohol use 1.8 oz/week    2 Cans of beer, 1 Standard drinks or equivalent per week     Comment: 2 40 oz daaily  . Drug use: No  . Sexual activity: Not Currently    Birth control/ protection: None   Other Topics Concern  . Not on file   Social History Narrative  . No narrative on file    Review of Systems: See HPI, otherwise negative ROS   Physical Exam: BP 124/85   Temp 98.9 F (37.2 C) (Oral)   Resp 17   SpO2 97%  General:   Alert,  pleasant and cooperative in NAD Head:  Normocephalic and atraumatic. Neck:  Supple; Lungs:  Clear throughout to auscultation.    Heart:  Regular rate and rhythm. Abdomen:  Soft, nontender and nondistended. Normal bowel sounds, without guarding, and without rebound.   Neurologic:  Alert and  oriented x4;  grossly normal neurologically.  Impression/Plan:     PUD/H PYLORI GASTRITIS  PLAN:  REPEAT EGD TO CONFIRM ULCERS ARE HEALED.

## 2017-03-13 ENCOUNTER — Encounter (HOSPITAL_COMMUNITY): Payer: Self-pay | Admitting: Gastroenterology

## 2017-03-18 ENCOUNTER — Encounter: Payer: Self-pay | Admitting: Gastroenterology

## 2017-03-19 ENCOUNTER — Telehealth: Payer: Self-pay | Admitting: Gastroenterology

## 2017-03-19 MED ORDER — CLARITHROMYCIN 500 MG PO TABS: ORAL_TABLET | ORAL | 0 refills | Status: DC

## 2017-03-19 MED ORDER — AMOXICILLIN 500 MG PO TABS: ORAL_TABLET | ORAL | 0 refills | Status: DC

## 2017-03-19 NOTE — Telephone Encounter (Signed)
PATIENT SCHEDULED  °

## 2017-03-19 NOTE — Telephone Encounter (Signed)
PLEASE CALL PT. HIS stomach Bx showed H. Pylori infection. He needs AMOXICILLIN 500 mg 2 po BID for 10 days and Biaxin 500 mg po bid for 10 days. CONTINUE NEXIUM. INCREASE TO 30 MINUTES BEFORE MEALS TWICE DAILY FOR 1 MONTH THEN ONCE DAILY. Med side effects include NVD, abd pain, and metallic taste.  CONTINUE A LOW FAT DIET. SEE INFO BELOW.  FOLLOW UP IN 6 MOS E30 PUD/CIRRHOSIS/H PYLORI GASTRITIS.

## 2017-03-20 NOTE — Telephone Encounter (Signed)
Pt is aware.  

## 2017-04-24 ENCOUNTER — Other Ambulatory Visit: Payer: Self-pay | Admitting: Family Medicine

## 2017-04-24 NOTE — Telephone Encounter (Signed)
Last seen 07/08/16  Dr Wendi Snipes

## 2017-05-23 ENCOUNTER — Other Ambulatory Visit: Payer: Self-pay | Admitting: Family Medicine

## 2017-05-23 ENCOUNTER — Other Ambulatory Visit: Payer: Self-pay | Admitting: Physician Assistant

## 2017-06-10 ENCOUNTER — Encounter: Payer: Self-pay | Admitting: Gastroenterology

## 2017-06-26 ENCOUNTER — Other Ambulatory Visit: Payer: Self-pay | Admitting: Family Medicine

## 2017-07-01 ENCOUNTER — Other Ambulatory Visit: Payer: Self-pay | Admitting: Family Medicine

## 2017-07-26 ENCOUNTER — Other Ambulatory Visit: Payer: Self-pay | Admitting: Family Medicine

## 2017-07-28 ENCOUNTER — Telehealth: Payer: Self-pay

## 2017-07-28 ENCOUNTER — Ambulatory Visit: Payer: Medicaid Other | Admitting: Nurse Practitioner

## 2017-07-28 NOTE — Telephone Encounter (Signed)
Opened in error

## 2017-07-31 ENCOUNTER — Other Ambulatory Visit: Payer: Self-pay | Admitting: Family Medicine

## 2017-08-03 ENCOUNTER — Ambulatory Visit: Payer: Medicaid Other | Admitting: Nurse Practitioner

## 2017-08-03 ENCOUNTER — Ambulatory Visit: Payer: Self-pay | Admitting: Family Medicine

## 2017-08-05 ENCOUNTER — Encounter: Payer: Self-pay | Admitting: Family Medicine

## 2017-08-12 ENCOUNTER — Ambulatory Visit: Payer: Self-pay | Admitting: Family Medicine

## 2017-08-18 ENCOUNTER — Telehealth: Payer: Self-pay

## 2017-08-18 NOTE — Telephone Encounter (Signed)
Received a PA request for nexium. Per Tiffany at the pharmacy [facility]) ,  There is no charge for a 30 day supply of the generic.

## 2017-08-18 NOTE — Telephone Encounter (Signed)
PLEASE CALL PHARMACY. OK TO USE GENERIC.

## 2017-08-18 NOTE — Telephone Encounter (Signed)
Pt and pharmacy are aware. However, the pharmacist today said that Medicaid requires name brand, but it has gone through fine and if there is any problem they will let us know.

## 2017-08-20 ENCOUNTER — Ambulatory Visit (INDEPENDENT_AMBULATORY_CARE_PROVIDER_SITE_OTHER): Payer: Medicaid Other | Admitting: Family Medicine

## 2017-08-20 ENCOUNTER — Encounter: Payer: Self-pay | Admitting: Family Medicine

## 2017-08-20 VITALS — BP 152/98 | HR 84 | Temp 97.8°F | Ht 67.0 in | Wt 202.4 lb

## 2017-08-20 DIAGNOSIS — R51 Headache: Secondary | ICD-10-CM | POA: Diagnosis not present

## 2017-08-20 DIAGNOSIS — R519 Headache, unspecified: Secondary | ICD-10-CM

## 2017-08-20 DIAGNOSIS — I10 Essential (primary) hypertension: Secondary | ICD-10-CM

## 2017-08-20 DIAGNOSIS — E118 Type 2 diabetes mellitus with unspecified complications: Secondary | ICD-10-CM

## 2017-08-20 LAB — BAYER DCA HB A1C WAIVED: HB A1C: 5.5 % (ref <7.0)

## 2017-08-20 MED ORDER — METOPROLOL SUCCINATE ER 50 MG PO TB24: 50.0000 mg | ORAL_TABLET | Freq: Every day | ORAL | 5 refills | Status: DC

## 2017-08-20 NOTE — Progress Notes (Signed)
   HPI  Patient presents today for follow-up chronic medical conditions.  Diabetes Good medication compliance No hypoglycemia. Watching diet moderately.  Hypertension Reports good medication compliance. No problems with tolerance.  Headaches Patient reports 5-10-minute headache that occurs on an almost daily basis.  States that it is self-limited, he does not have to take any medications for it.   PMH: Smoking status noted ROS: Per HPI  Objective: BP (!) 152/98   Pulse 84   Temp 97.8 F (36.6 C) (Oral)   Ht 5\' 7"  (1.702 m)   Wt 202 lb 6.4 oz (91.8 kg)   BMI 31.70 kg/m  Gen: NAD, alert, cooperative with exam HEENT: NCAT CV: RRR, good S1/S2, no murmur Resp: CTABL, no wheezes, non-labored Ext: No edema, warm Neuro: Alert and oriented, No gross deficits  Assessment and plan:  #Type 2 diabetes Controlled with glipizide, A1c 5.5 No changes   #Hypertension Slightly elevated today for his usual, he states that this is likely because he has been staying up late watching basketball the last week or so. Continue current medications, refill metoprolol which according to the dates prescribed he has had poor compliance with this 1.  #Headaches Self-limited Patient on Topamax, unclear whether he is on his beta-blocker or not Restart metoprolol No changes    Orders Placed This Encounter  Procedures  . Microalbumin / creatinine urine ratio  . [facility] Hb A1c Carney Bern, MD [facility] 08/20/2017, 9:39 AM

## 2017-08-20 NOTE — Patient Instructions (Signed)
Great to see you!  Come back in 3-4 months unless you need us sooner.    

## 2017-08-21 LAB — CMP14+EGFR
A/G RATIO: 1.3 (ref 1.2–2.2)
ALT: 26 IU/L (ref 0–44)
AST: 27 IU/L (ref 0–40)
Albumin: 4.3 g/dL (ref 3.6–4.8)
Alkaline Phosphatase: 89 IU/L (ref 39–117)
BUN/Creatinine Ratio: 6 — ABNORMAL LOW (ref 10–24)
BUN: 8 mg/dL (ref 8–27)
Bilirubin Total: <0.2 mg/dL (ref 0.0–1.2)
CO2: 24 mmol/L (ref 20–29)
Calcium: 9.3 mg/dL (ref 8.6–10.2)
Chloride: 104 mmol/L (ref 96–106)
Creatinine, Ser: 1.29 mg/dL — ABNORMAL HIGH (ref 0.76–1.27)
GFR calc non Af Amer: 59 mL/min/{1.73_m2} — ABNORMAL LOW (ref >59)
GFR, EST AFRICAN AMERICAN: 69 mL/min/{1.73_m2} (ref >59)
Globulin, Total: 3.3 g/dL (ref 1.5–4.5)
Glucose: 97 mg/dL (ref 65–99)
Potassium: 4.2 mmol/L (ref 3.5–5.2)
Sodium: 144 mmol/L (ref 134–144)
TOTAL PROTEIN: 7.6 g/dL (ref 6.0–8.5)

## 2017-08-21 LAB — LIPID PANEL
Chol/HDL Ratio: 3.2 ratio (ref 0.0–5.0)
Cholesterol, Total: 178 mg/dL (ref 100–199)
HDL: 55 mg/dL (ref >39)
LDL Calculated: 69 mg/dL (ref 0–99)
Triglycerides: 272 mg/dL — ABNORMAL HIGH (ref 0–149)
VLDL CHOLESTEROL CAL: 54 mg/dL — AB (ref 5–40)

## 2017-08-21 LAB — CBC WITH DIFFERENTIAL/PLATELET
BASOS: 1 %
Basophils Absolute: 0.1 10*3/uL (ref 0.0–0.2)
EOS (ABSOLUTE): 0.6 10*3/uL — ABNORMAL HIGH (ref 0.0–0.4)
Eos: 11 %
HEMOGLOBIN: 15.6 g/dL (ref 13.0–17.7)
Hematocrit: 45 % (ref 37.5–51.0)
IMMATURE GRANS (ABS): 0 10*3/uL (ref 0.0–0.1)
Immature Granulocytes: 0 %
LYMPHS: 34 %
Lymphocytes Absolute: 2 10*3/uL (ref 0.7–3.1)
MCH: 30.8 pg (ref 26.6–33.0)
MCHC: 34.7 g/dL (ref 31.5–35.7)
MCV: 89 fL (ref 79–97)
MONOCYTES: 13 %
Monocytes Absolute: 0.8 10*3/uL (ref 0.1–0.9)
NEUTROS ABS: 2.5 10*3/uL (ref 1.4–7.0)
Neutrophils: 41 %
Platelets: 151 10*3/uL (ref 150–379)
RBC: 5.06 x10E6/uL (ref 4.14–5.80)
RDW: 14.9 % (ref 12.3–15.4)
WBC: 5.9 10*3/uL (ref 3.4–10.8)

## 2017-08-30 ENCOUNTER — Other Ambulatory Visit: Payer: Self-pay | Admitting: Gastroenterology

## 2017-09-21 ENCOUNTER — Other Ambulatory Visit: Payer: Self-pay | Admitting: Family Medicine

## 2017-09-21 NOTE — Telephone Encounter (Signed)
Last seen 08/20/17  Dr Bradshaw 

## 2017-09-28 ENCOUNTER — Encounter: Payer: Self-pay | Admitting: *Deleted

## 2017-09-28 ENCOUNTER — Encounter: Payer: Self-pay | Admitting: Nurse Practitioner

## 2017-09-28 ENCOUNTER — Telehealth: Payer: Self-pay | Admitting: *Deleted

## 2017-09-28 ENCOUNTER — Ambulatory Visit (INDEPENDENT_AMBULATORY_CARE_PROVIDER_SITE_OTHER): Payer: Medicaid Other | Admitting: Nurse Practitioner

## 2017-09-28 VITALS — BP 149/97 | HR 80 | Temp 97.8°F | Ht 67.0 in | Wt 201.0 lb

## 2017-09-28 DIAGNOSIS — K7460 Unspecified cirrhosis of liver: Secondary | ICD-10-CM | POA: Diagnosis not present

## 2017-09-28 DIAGNOSIS — A048 Other specified bacterial intestinal infections: Secondary | ICD-10-CM

## 2017-09-28 DIAGNOSIS — K279 Peptic ulcer, site unspecified, unspecified as acute or chronic, without hemorrhage or perforation: Secondary | ICD-10-CM

## 2017-09-28 HISTORY — DX: Other specified bacterial intestinal infections: A04.8

## 2017-09-28 NOTE — Progress Notes (Addendum)
REVIEWED-NO ADDITIONAL RECOMMENDATIONS.  Referring Provider: Timmothy Euler, MD Primary Care Physician:  Timmothy Euler, MD Primary GI:  Dr. Oneida Alar  Chief Complaint  Patient presents with  . Cirrhosis  . Peptic Ulcer Disease    HPI:   Philip [name] is a 62 y.o. male who presents for follow-up on peptic ulcer disease and cirrhosis.  The patient was last seen in our office 01/28/2017 for the same.  Noted history of hepatitis C status post treatment and sustained viral response.  EGD for variceal screening found peptic ulcer disease positive for H. pylori for which she completed antibiotics.  Continues to drink a beer "every once in a while."  On Nexium twice daily.  Recommended follow-up EGD initially scheduled for 04/01/2016 but canceled and no-show for follow-up appointment on 09/09/2016.  At his last visit he was doing okay, denies any hepatic or GI symptoms.  Recommended completion of cirrhosis labs and imaging, schedule overdue EGD, follow-up in 6 months.  Labs are completed 03/06/2017 which found elevated AFP at 9.0, thrombocytopenia.  Labs essentially unchanged from 2 years prior.  AFP mild elevation not concerning in the presence of normal right upper quadrant ultrasound.  Right upper quadrant ultrasound completed 03/11/2017 which found persistent increased echogenicity of the liver consistent with steatosis, no focal lesions.  EGD completed 03/10/2017 which found benign-appearing esophageal peptic stricture, moderate erosive gastritis, mild duodenitis.  Status post biopsies.  Recommend continue current medications, follow-up in 6 months.  Surgical pathology found the biopsies to be H. pylori gastritis.  The patient was prescribed amoxicillin/Biaxin/Nexium.  Today he states he's doing better. He states he completed his antibiotics. Taking Nexium once daily. Denies any ongoing epigastic pain/GERD symptoms. Denies dysphagia. Indicated may need dilation in the future. Denies  abdominal pain, N/V, hematochezia, melena, fever, chills, unintentional weight loss. Denies yellowing of skin/eyes, darkened urine, acute episodic confusion, tremors, generalized pruritis. Denies chest pain, dyspnea, dizziness, lightheadedness, syncope, near syncope. Denies any other upper or lower GI symptoms.   Drinks occasionally with a one beer every several days.  He recently lost a close friend who died unexpectedly at age 64. He's been talking to neighbors about sadness occasionally.  Past Medical History:  Diagnosis Date  . Diabetes mellitus without complication (HCC)    borderline  . Erectile dysfunction   . GERD (gastroesophageal reflux disease)   . Headache(784.0)    "Headache, I dont think they are migranes"  . Hepatitis C    Harvoni, completed 06/2015 (Dr. Linus Salmons) , AUG 2017 SVR  . History of kidney stones    history of kidney stones  . History of migraine    takes Topamax daily  . Hypertension    takes Hydralazine,Maxzide,Clonidine,and Amlodipine daily  . Hypertension    also takes Metoprolol daily  . Nausea    takes Zofran daily  . Seizures ([place])    off Dilantin daily.  last one was when he had a stroke.  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 Sleep apnea    CPAP- tries to do it everyday.  . Stroke ([place])    .  Short term memory loss.; left sided weakness  . Tobacco user     Past Surgical History:  Procedure Laterality Date  . BIOPSY  03/10/2017   Procedure: BIOPSY;  Surgeon: Danie Binder, MD;  [facility];  Service: Endoscopy;;  gastric biopsy   . CHOLECYSTECTOMY N/A 03/09/2013   Procedure: LAPAROSCOPIC CHOLECYSTECTOMY  WITH INTRAOPERATIVE CHOLANGIOGRAM;  Surgeon: Imogene Burn. Georgette Dover, MD;  Location: [place];  Service: General;  Laterality: N/A;  . COLONOSCOPY  06/2014   Dr. Olevia Perches: Three sessile polyps were found in the rectum x2 and descending. tubular adenomas. colonoscopy in 06/2019  . ESOPHAGOGASTRODUODENOSCOPY (EGD) WITH PROPOFOL N/A  08/21/2015   Dr. Oneida Alar: normal esophagus, non-bleeding gastric ulcers/gastritis (H.pylori)   . ESOPHAGOGASTRODUODENOSCOPY (EGD) WITH PROPOFOL N/A 03/10/2017   Procedure: ESOPHAGOGASTRODUODENOSCOPY (EGD) WITH PROPOFOL;  Surgeon: Danie Binder, MD;  [facility];  Service: Endoscopy;  Laterality: N/A;  1:00pm-rescheduled to 10\16 @ 2:87OM   . UMBILICAL HERNIA REPAIR N/A 03/09/2013   Procedure: HERNIA REPAIR UMBILICAL ADULT;  Surgeon: Imogene Burn. Georgette Dover, MD;  Location: [place] [facility];  Service: General;  Laterality: N/A;    Current Outpatient Medications  Medication Sig Dispense Refill  . amLODipine (NORVASC) 10 MG tablet Take 1 tablet (10 mg total) by mouth daily. 90 tablet 3  . aspirin EC 81 MG tablet Take 81 mg by mouth daily.    . fish oil-omega-3 fatty acids 1000 MG capsule Take 2 g by mouth every other day.     . gabapentin (NEURONTIN) 300 MG capsule TAKE 1 CAPSULE BY MOUTH AT BEDTIME 30 capsule 2  . glipiZIDE (GLUCOTROL) 5 MG tablet TAKE 1 TABLET BY MOUTH ONCE DAILY BEFORE BREAKFAST 30 tablet 2  . hydrALAZINE (APRESOLINE) 25 MG tablet TAKE 1 TABLET BY MOUTH THREE TIMES DAILY 90 tablet 3  . metoprolol succinate (TOPROL-XL) 50 MG 24 hr tablet Take 1 tablet (50 mg total) by mouth daily. Take with or immediately following a meal. 30 tablet 5  . NEXIUM 40 MG capsule Take 1 capsule (40 mg total) by mouth daily before breakfast. 30 capsule 11  . topiramate (TOPAMAX) 25 MG tablet Take 1 tablet (25 mg total) by mouth 2 (two) times daily. 180 tablet 3   No current facility-administered medications for this visit.     Allergies as of 09/28/2017  . (No Known Allergies)    Family History  Problem Relation Age of Onset  . Heart disease Maternal Grandmother   . Heart disease Maternal Grandfather   . Heart disease Paternal Grandmother   . Heart disease Paternal Grandfather   . Cancer Cousin   . Hypertension Mother   . Colon cancer Maternal Uncle   . Rectal cancer Neg Hx   . Stomach cancer  Neg Hx     Social History   Socioeconomic History  . Marital status: Single    Spouse name: Not on file  . Number of children: Not on file  . Years of education: Not on file  . Highest education level: Not on file  Occupational History  . Not on file  Social Needs  . Financial resource strain: Not on file  . Food insecurity:    Worry: Not on file    Inability: Not on file  . Transportation needs:    Medical: Not on file    Non-medical: Not on file  Tobacco Use  . Smoking status: Current Every Day Smoker    Packs/day: 0.20    Years: 25.00    Pack years: 5.00    Types: Cigarettes    Start date: 05/26/1978  . Smokeless tobacco: Never Used  . Tobacco comment: slowing down  Substance and Sexual Activity  . Alcohol use: Yes    Alcohol/week: 1.8 oz    Types: 2 Cans of beer, 1 Standard drinks or equivalent per week  Comment: intermittent beer every few days; never ore than 2 beers per sitting.  . Drug use: No    Frequency: 1.0 times per week  . Sexual activity: Not Currently    Birth control/protection: None  Lifestyle  . Physical activity:    Days per week: Not on file    Minutes per session: Not on file  . Stress: Not on file  Relationships  . Social connections:    Talks on phone: Not on file    Gets together: Not on file    Attends religious service: Not on file    Active member of club or organization: Not on file    Attends meetings of clubs or organizations: Not on file    Relationship status: Not on file  Other Topics Concern  . Not on file  Social History Narrative  . Not on file    Review of Systems: Complete ROS negative except as per HPI.   Physical Exam: BP (!) 149/97   Pulse 80   Temp 97.8 F (36.6 C) (Oral)   Ht 5\' 7"  (1.702 m)   Wt 201 lb (91.2 kg)   BMI 31.48 kg/m  General:   Alert and oriented. Pleasant and cooperative. Well-nourished and well-developed.  Eyes:  Without icterus, sclera clear and conjunctiva pink.  Ears:  Normal  auditory acuity. Cardiovascular:  S1, S2 present without murmurs appreciated. Extremities without clubbing or edema. Respiratory:  Clear to auscultation bilaterally. No wheezes, rales, or rhonchi. No distress.  Gastrointestinal:  +BS, rounded but soft, non-tender and non-distended. No HSM noted. No guarding or rebound. No masses appreciated.  Rectal:  Deferred  Musculoskalatal:  Symmetrical without gross deformities. Neurologic:  Alert and oriented x4;  grossly normal neurologically. Psych:  Alert and cooperative. Normal mood and affect. Heme/Lymph/Immune: No excessive bruising noted.    09/28/2017 11:46 AM   Disclaimer: This note was dictated with voice recognition software. Similar sounding words can inadvertently be transcribed and may not be corrected upon review.

## 2017-09-28 NOTE — Progress Notes (Signed)
CC'D TO PCP °

## 2017-09-28 NOTE — Patient Instructions (Signed)
1. Have your labs drawn when you are able to. 2. We will help schedule your ultrasound of your liver. 3. Continue taking your current medications including Nexium. 4. Return for follow-up in 6 months for routine liver care.  At that time we will likely recommend holding Nexium for 2 weeks to check a breath test to make sure the bacteria retreated is gone. 5. Call us if you have any questions or concerns   At [facility] we value your feedback. You may receive a survey about your visit today. Please share your experience as we strive to create trusting relationships with our patients to provide genuine, compassionate, quality care.  It was good to see you today!  I am truly sorry about the loss of your friend.  I hope you have a peaceful summer!!

## 2017-09-28 NOTE — Assessment & Plan Note (Signed)
Previous peptic ulcer disease noted on EGD for surveillance of varices.  Repeat EGD in 2018 found moderate erosive gastritis but no peptic ulcers.  Was positive for H. pylori status post treatment.  Continue to monitor symptoms, continue PPI.  Return for follow-up in 6 months.

## 2017-09-28 NOTE — Assessment & Plan Note (Addendum)
History of cirrhosis with previous chronic hepatitis C status post eradication and sustained viral response.  He does still drink intermittently 1-2 beers per sitting about every several days.  I have cautioned him against excessive alcohol intake as this could further his liver damage.  Overall he is asymptomatic from a hepatic standpoint.  We will recheck routine liver labs and imaging.  Follow-up in 6 months.  Next due for variceal surveillance in 2020, already on recall.

## 2017-09-28 NOTE — Assessment & Plan Note (Signed)
Infection documented on EGD biopsies.  He was treated with antibiotics.  He states he took all of his medications.  His upper GI symptoms have improved dramatically.  At this point continue PPI, follow-up in 6 months to recheck H. pylori for eradication.

## 2017-09-28 NOTE — Telephone Encounter (Signed)
PA for RUQ ultrasound was approved via [employer]'s webite. Josem Kaufmann #[id number] dates 09/28/17-10/28/17

## 2017-10-07 ENCOUNTER — Ambulatory Visit (HOSPITAL_COMMUNITY): Payer: Medicaid Other

## 2017-10-09 ENCOUNTER — Other Ambulatory Visit: Payer: Self-pay | Admitting: Family Medicine

## 2017-10-14 ENCOUNTER — Ambulatory Visit (HOSPITAL_COMMUNITY)
Admission: RE | Admit: 2017-10-14 | Discharge: 2017-10-14 | Disposition: A | Discharge status: Home / Self Care | Payer: Medicaid Other | Source: Ambulatory Visit | Attending: Nurse Practitioner | Admitting: Nurse Practitioner

## 2017-10-14 DIAGNOSIS — Z9049 Acquired absence of other specified parts of digestive tract: Secondary | ICD-10-CM | POA: Diagnosis not present

## 2017-10-14 DIAGNOSIS — K279 Peptic ulcer, site unspecified, unspecified as acute or chronic, without hemorrhage or perforation: Secondary | ICD-10-CM | POA: Diagnosis not present

## 2017-10-14 DIAGNOSIS — K7460 Unspecified cirrhosis of liver: Secondary | ICD-10-CM | POA: Diagnosis present

## 2017-10-14 DIAGNOSIS — A048 Other specified bacterial intestinal infections: Secondary | ICD-10-CM | POA: Diagnosis not present

## 2017-10-20 NOTE — Progress Notes (Signed)
PT is aware of results and he will go do his blood work this week.

## 2017-11-09 LAB — AFP TUMOR MARKER: AFP-Tumor Marker: 9.5 ng/mL — ABNORMAL HIGH (ref <6.1)

## 2017-11-09 LAB — COMPREHENSIVE METABOLIC PANEL
AG Ratio: 1.4 (calc) (ref 1.0–2.5)
ALT: 25 U/L (ref 9–46)
AST: 25 U/L (ref 10–35)
Albumin: 4.6 g/dL (ref 3.6–5.1)
Alkaline phosphatase (APISO): 75 U/L (ref 40–115)
BILIRUBIN TOTAL: 0.5 mg/dL (ref 0.2–1.2)
BUN / CREAT RATIO: 11 (calc) (ref 6–22)
BUN: 15 mg/dL (ref 7–25)
CALCIUM: 10 mg/dL (ref 8.6–10.3)
CO2: 27 mmol/L (ref 20–32)
CREATININE: 1.38 mg/dL — AB (ref 0.70–1.25)
Chloride: 105 mmol/L (ref 98–110)
GLUCOSE: 143 mg/dL — AB (ref 65–139)
Globulin: 3.2 g/dL (calc) (ref 1.9–3.7)
Potassium: 4.1 mmol/L (ref 3.5–5.3)
SODIUM: 140 mmol/L (ref 135–146)
TOTAL PROTEIN: 7.8 g/dL (ref 6.1–8.1)

## 2017-11-09 LAB — CBC WITH DIFFERENTIAL/PLATELET
BASOS ABS: 48 {cells}/uL (ref 0–200)
Basophils Relative: 0.9 %
EOS PCT: 9.4 %
Eosinophils Absolute: 498 cells/uL (ref 15–500)
HEMATOCRIT: 44.8 % (ref 38.5–50.0)
Hemoglobin: 16.2 g/dL (ref 13.2–17.1)
LYMPHS ABS: 1489 {cells}/uL (ref 850–3900)
MCH: 31 pg (ref 27.0–33.0)
MCHC: 36.2 g/dL — ABNORMAL HIGH (ref 32.0–36.0)
MCV: 85.7 fL (ref 80.0–100.0)
MONOS PCT: 12.7 %
MPV: 12.7 fL — ABNORMAL HIGH (ref 7.5–12.5)
NEUTROS PCT: 48.9 %
Neutro Abs: 2592 cells/uL (ref 1500–7800)
PLATELETS: 113 10*3/uL — AB (ref 140–400)
RBC: 5.23 10*6/uL (ref 4.20–5.80)
RDW: 13.9 % (ref 11.0–15.0)
Total Lymphocyte: 28.1 %
WBC mixed population: 673 cells/uL (ref 200–950)
WBC: 5.3 10*3/uL (ref 3.8–10.8)

## 2017-11-09 LAB — PROTIME-INR
INR: 1
Prothrombin Time: 10.3 s (ref 9.0–11.5)

## 2017-11-10 NOTE — Progress Notes (Signed)
LMOM to call.

## 2017-11-11 NOTE — Progress Notes (Signed)
Pt is aware. Manuela Schwartz please forward to his PCP.

## 2018-03-02 ENCOUNTER — Other Ambulatory Visit: Payer: Self-pay | Admitting: *Deleted

## 2018-03-02 MED ORDER — GABAPENTIN 300 MG PO CAPS: 300.0000 mg | ORAL_CAPSULE | Freq: Every day | ORAL | 0 refills | Status: DC

## 2018-03-02 MED ORDER — GLIPIZIDE 5 MG PO TABS: ORAL_TABLET | ORAL | 0 refills | Status: DC

## 2018-03-02 MED ORDER — AMLODIPINE BESYLATE 10 MG PO TABS: 10.0000 mg | ORAL_TABLET | Freq: Every day | ORAL | 0 refills | Status: DC

## 2018-03-02 MED ORDER — HYDRALAZINE HCL 25 MG PO TABS: 25.0000 mg | ORAL_TABLET | Freq: Three times a day (TID) | ORAL | 0 refills | Status: DC

## 2018-03-04 DIAGNOSIS — G4733 Obstructive sleep apnea (adult) (pediatric): Secondary | ICD-10-CM | POA: Diagnosis not present

## 2018-03-31 ENCOUNTER — Ambulatory Visit: Payer: Medicaid Other | Admitting: Nurse Practitioner

## 2018-03-31 NOTE — Progress Notes (Deleted)
Referring Provider: Timmothy Euler, MD Primary Care Physician:  Timmothy Euler, MD Primary GI:  Dr.   Rayne Du chief complaint on file.   HPI:   Philip [name] is a 62 y.o. male who presents for follow-up on cirrhosis.  The patient was last seen in our office 09/28/2017 for cirrhosis, peptic ulcer disease, H. pylori infection.  Noted history of hepatitis C status post treatment and SVR.  EGD for variceal screening found peptic ulcer disease positive for H. pylori for which he completed antibiotics.  Nexium twice daily.  At his last visit he was doing better, completed H. pylori antibiotics, taking Nexium once daily.  No ongoing upper GI symptoms.  No hepatic symptoms.  Notes that he does continue to drink with one beer every several days.  Recommended updated cirrhosis labs and liver ultrasound, continue current medications, follow-up in 6 months.  Labs completed 11/06/2017 which found CBC essentially normal other than mildly depressed platelet count 113.  CMP with mildly elevated creatinine at 1.38 although technically within baseline has shown to be mildly creeping up.  INR normal at 1.0.  AFP was again mildly elevated at 9.5.  Recommended forward labs, especially creatinine, to primary care for further evaluation.  Right upper quadrant ultrasound completed 10/14/2017 which found consistent with cirrhosis.  No masses noted.  Today he states   Past Medical History:  Diagnosis Date  . Diabetes mellitus without complication (HCC)    borderline  . Erectile dysfunction   . GERD (gastroesophageal reflux disease)   . Headache(784.0)    "Headache, I dont think they are migranes"  . Hepatitis C    Harvoni, completed 06/2015 (Dr. Linus Salmons) , AUG 2017 SVR  . History of kidney stones    history of kidney stones  . History of migraine    takes Topamax daily  . Hypertension    takes Hydralazine,Maxzide,Clonidine,and Amlodipine daily  . Hypertension    also takes Metoprolol daily  . Nausea    takes Zofran daily  . Seizures ([place])    off Dilantin daily.  last one was when he had a stroke.  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 Sleep apnea    CPAP- tries to do it everyday.  . Stroke ([place])    .  Short term memory loss.; left sided weakness  . Tobacco user     Past Surgical History:  Procedure Laterality Date  . BIOPSY  03/10/2017   Procedure: BIOPSY;  Surgeon: Danie Binder, MD;  [facility];  Service: Endoscopy;;  gastric biopsy   . CHOLECYSTECTOMY N/A 03/09/2013   Procedure: LAPAROSCOPIC CHOLECYSTECTOMY WITH INTRAOPERATIVE CHOLANGIOGRAM;  Surgeon: Imogene Burn. Georgette Dover, MD;  Location: [place];  Service: General;  Laterality: N/A;  . COLONOSCOPY  06/2014   Dr. Olevia Perches: Three sessile polyps were found in the rectum x2 and descending. tubular adenomas. colonoscopy in 06/2019  . ESOPHAGOGASTRODUODENOSCOPY (EGD) WITH PROPOFOL N/A 08/21/2015   Dr. Oneida Alar: normal esophagus, non-bleeding gastric ulcers/gastritis (H.pylori)   . ESOPHAGOGASTRODUODENOSCOPY (EGD) WITH PROPOFOL N/A 03/10/2017   Procedure: ESOPHAGOGASTRODUODENOSCOPY (EGD) WITH PROPOFOL;  Surgeon: Danie Binder, MD;  [facility];  Service: Endoscopy;  Laterality: N/A;  1:00pm-rescheduled to 10\16 @ 1:60FU   . UMBILICAL HERNIA REPAIR N/A 03/09/2013   Procedure: HERNIA REPAIR UMBILICAL ADULT;  Surgeon: Imogene Burn. Georgette Dover, MD;  Location: [place] [facility];  Service: General;  Laterality: N/A;    Current Outpatient Medications  Medication Sig Dispense  Refill  . amLODipine (NORVASC) 10 MG tablet Take 1 tablet (10 mg total) by mouth daily. (Needs to be seen before next refill) 30 tablet 0  . aspirin EC 81 MG tablet Take 81 mg by mouth daily.    . fish oil-omega-3 fatty acids 1000 MG capsule Take 2 g by mouth every other day.     . gabapentin (NEURONTIN) 300 MG capsule Take 1 capsule (300 mg total) by mouth at bedtime. (Needs to be seen before next refill) 30 capsule 0  . glipiZIDE (GLUCOTROL) 5 MG tablet TAKE 1  TABLET BY MOUTH ONCE DAILY BEFORE BREAKFAST (Needs to be seen before next refill) 30 tablet 0  . hydrALAZINE (APRESOLINE) 25 MG tablet Take 1 tablet (25 mg total) by mouth 3 (three) times daily. (Needs to be seen before next refill) 90 tablet 0  . metoprolol succinate (TOPROL-XL) 50 MG 24 hr tablet Take 1 tablet (50 mg total) by mouth daily. Take with or immediately following a meal. 30 tablet 5  . NEXIUM 40 MG capsule Take 1 capsule (40 mg total) by mouth daily before breakfast. 30 capsule 11  . topiramate (TOPAMAX) 25 MG tablet TAKE 1 TABLET BY MOUTH TWICE DAILY 180 tablet 3   No current facility-administered medications for this visit.     Allergies as of 03/31/2018  . (No Known Allergies)    Family History  Problem Relation Age of Onset  . Heart disease Maternal Grandmother   . Heart disease Maternal Grandfather   . Heart disease Paternal Grandmother   . Heart disease Paternal Grandfather   . Cancer Cousin   . Hypertension Mother   . Colon cancer Maternal Uncle   . Rectal cancer Neg Hx   . Stomach cancer Neg Hx     Social History   Socioeconomic History  . Marital status: Single    Spouse name: Not on file  . Number of children: Not on file  . Years of education: Not on file  . Highest education level: Not on file  Occupational History  . Not on file  Social Needs  . Financial resource strain: Not on file  . Food insecurity:    Worry: Not on file    Inability: Not on file  . Transportation needs:    Medical: Not on file    Non-medical: Not on file  Tobacco Use  . Smoking status: Current Every Day Smoker    Packs/day: 0.20    Years: 25.00    Pack years: 5.00    Types: Cigarettes    Start date: 05/26/1978  . Smokeless tobacco: Never Used  . Tobacco comment: slowing down  Substance and Sexual Activity  . Alcohol use: Yes    Alcohol/week: 3.0 standard drinks    Types: 2 Cans of beer, 1 Standard drinks or equivalent per week    Comment: intermittent beer every  few days; never ore than 2 beers per sitting.  . Drug use: No    Frequency: 1.0 times per week  . Sexual activity: Not Currently    Birth control/protection: None  Lifestyle  . Physical activity:    Days per week: Not on file    Minutes per session: Not on file  . Stress: Not on file  Relationships  . Social connections:    Talks on phone: Not on file    Gets together: Not on file    Attends religious service: Not on file    Active member of club or organization: Not  on file    Attends meetings of clubs or organizations: Not on file    Relationship status: Not on file  Other Topics Concern  . Not on file  Social History Narrative  . Not on file    Review of Systems: General: Negative for anorexia, weight loss, fever, chills, fatigue, weakness. Eyes: Negative for vision changes.  ENT: Negative for hoarseness, difficulty swallowing , nasal congestion. CV: Negative for chest pain, angina, palpitations, dyspnea on exertion, peripheral edema.  Respiratory: Negative for dyspnea at rest, dyspnea on exertion, cough, sputum, wheezing.  GI: See history of present illness. GU:  Negative for dysuria, hematuria, urinary incontinence, urinary frequency, nocturnal urination.  MS: Negative for joint pain, low back pain.  Derm: Negative for rash or itching.  Neuro: Negative for weakness, abnormal sensation, seizure, frequent headaches, memory loss, confusion.  Psych: Negative for anxiety, depression, suicidal ideation, hallucinations.  Endo: Negative for unusual weight change.  Heme: Negative for bruising or bleeding. Allergy: Negative for rash or hives.   Physical Exam: There were no vitals taken for this visit. General:   Alert and oriented. Pleasant and cooperative. Well-nourished and well-developed.  Head:  Normocephalic and atraumatic. Eyes:  Without icterus, sclera clear and conjunctiva pink.  Ears:  Normal auditory acuity. Mouth:  No deformity or lesions, oral mucosa pink.    Throat/Neck:  Supple, without mass or thyromegaly. Cardiovascular:  S1, S2 present without murmurs appreciated. Normal pulses noted. Extremities without clubbing or edema. Respiratory:  Clear to auscultation bilaterally. No wheezes, rales, or rhonchi. No distress.  Gastrointestinal:  +BS, soft, non-tender and non-distended. No HSM noted. No guarding or rebound. No masses appreciated.  Rectal:  Deferred  Musculoskalatal:  Symmetrical without gross deformities. Normal posture. Skin:  Intact without significant lesions or rashes. Neurologic:  Alert and oriented x4;  grossly normal neurologically. Psych:  Alert and cooperative. Normal mood and affect. Heme/Lymph/Immune: No significant cervical adenopathy. No excessive bruising noted.    03/31/2018 9:34 AM   Disclaimer: This note was dictated with voice recognition software. Similar sounding words can inadvertently be transcribed and may not be corrected upon review.

## 2018-05-02 ENCOUNTER — Other Ambulatory Visit: Payer: Self-pay | Admitting: Physician Assistant

## 2018-05-03 NOTE — Telephone Encounter (Signed)
Last seen 08/20/17  Dr Bradshaw 

## 2018-05-04 NOTE — Telephone Encounter (Signed)
Pt scheduled with Dr Livia Snellen 12/11 at 9:55.

## 2018-05-05 ENCOUNTER — Ambulatory Visit: Payer: Medicaid Other | Admitting: Family Medicine

## 2018-06-02 ENCOUNTER — Ambulatory Visit: Payer: Medicaid Other | Admitting: Family Medicine

## 2018-06-11 ENCOUNTER — Encounter: Payer: Self-pay | Admitting: Family Medicine

## 2018-06-11 ENCOUNTER — Ambulatory Visit: Payer: Medicaid Other | Admitting: Family Medicine

## 2018-06-11 ENCOUNTER — Other Ambulatory Visit: Payer: Self-pay | Admitting: Family Medicine

## 2018-06-11 VITALS — BP 142/89 | HR 85 | Temp 98.0°F | Ht 67.0 in | Wt 203.4 lb

## 2018-06-11 DIAGNOSIS — E118 Type 2 diabetes mellitus with unspecified complications: Secondary | ICD-10-CM | POA: Diagnosis not present

## 2018-06-11 DIAGNOSIS — E785 Hyperlipidemia, unspecified: Secondary | ICD-10-CM

## 2018-06-11 DIAGNOSIS — I10 Essential (primary) hypertension: Secondary | ICD-10-CM

## 2018-06-11 DIAGNOSIS — K7460 Unspecified cirrhosis of liver: Secondary | ICD-10-CM

## 2018-06-11 LAB — BAYER DCA HB A1C WAIVED: HB A1C (BAYER DCA - WAIVED): 5.1 % (ref <7.0)

## 2018-06-11 MED ORDER — GLIPIZIDE 5 MG PO TABS: ORAL_TABLET | ORAL | 2 refills | Status: DC

## 2018-06-11 MED ORDER — GABAPENTIN 300 MG PO CAPS: ORAL_CAPSULE | ORAL | 2 refills | Status: DC

## 2018-06-11 MED ORDER — AMLODIPINE BESYLATE 10 MG PO TABS: 10.0000 mg | ORAL_TABLET | Freq: Every day | ORAL | 2 refills | Status: DC

## 2018-06-11 MED ORDER — HYDRALAZINE HCL 25 MG PO TABS: ORAL_TABLET | ORAL | 2 refills | Status: DC

## 2018-06-11 MED ORDER — METOPROLOL SUCCINATE ER 50 MG PO TB24: 50.0000 mg | ORAL_TABLET | Freq: Every day | ORAL | 2 refills | Status: DC

## 2018-06-11 MED ORDER — TOPIRAMATE 25 MG PO TABS: 25.0000 mg | ORAL_TABLET | Freq: Two times a day (BID) | ORAL | 2 refills | Status: DC

## 2018-06-11 NOTE — Patient Instructions (Signed)
DASH Eating Plan  DASH stands for "Dietary Approaches to Stop Hypertension." The DASH eating plan is a healthy eating plan that has been shown to reduce high blood pressure (hypertension). It may also reduce your risk for type 2 diabetes, heart disease, and stroke. The DASH eating plan may also help with weight loss.  What are tips for following this plan?    General guidelines   Avoid eating more than 2,300 mg (milligrams) of salt (sodium) a day. If you have hypertension, you may need to reduce your sodium intake to 1,500 mg a day.   Limit alcohol intake to no more than 1 drink a day for nonpregnant women and 2 drinks a day for men. One drink equals 12 oz of beer, 5 oz of wine, or 1 oz of hard liquor.   Work with your health care provider to maintain a healthy body weight or to lose weight. Ask what an ideal weight is for you.   Get at least 30 minutes of exercise that causes your heart to beat faster (aerobic exercise) most days of the week. Activities may include walking, swimming, or biking.   Work with your health care provider or diet and nutrition specialist (dietitian) to adjust your eating plan to your individual calorie needs.  Reading food labels     Check food labels for the amount of sodium per serving. Choose foods with less than 5 percent of the Daily Value of sodium. Generally, foods with less than 300 mg of sodium per serving fit into this eating plan.   To find whole grains, look for the word "whole" as the first word in the ingredient list.  Shopping   Buy products labeled as "low-sodium" or "no salt added."   Buy fresh foods. Avoid canned foods and premade or frozen meals.  Cooking   Avoid adding salt when cooking. Use salt-free seasonings or herbs instead of table salt or sea salt. Check with your health care provider or pharmacist before using salt substitutes.   Do not fry foods. Cook foods using healthy methods such as baking, boiling, grilling, and broiling instead.   Cook with  heart-healthy oils, such as olive, canola, soybean, or sunflower oil.  Meal planning   Eat a balanced diet that includes:  ? 5 or more servings of fruits and vegetables each day. At each meal, try to fill half of your plate with fruits and vegetables.  ? Up to 6-8 servings of whole grains each day.  ? Less than 6 oz of lean meat, poultry, or fish each day. A 3-oz serving of meat is about the same size as a deck of cards. One egg equals 1 oz.  ? 2 servings of low-fat dairy each day.  ? A serving of nuts, seeds, or beans 5 times each week.  ? Heart-healthy fats. Healthy fats called Omega-3 fatty acids are found in foods such as flaxseeds and coldwater fish, like sardines, salmon, and mackerel.   Limit how much you eat of the following:  ? Canned or prepackaged foods.  ? Food that is high in trans fat, such as fried foods.  ? Food that is high in saturated fat, such as fatty meat.  ? Sweets, desserts, sugary drinks, and other foods with added sugar.  ? Full-fat dairy products.   Do not salt foods before eating.   Try to eat at least 2 vegetarian meals each week.   Eat more home-cooked food and less restaurant, buffet, and fast food.     When eating at a restaurant, ask that your food be prepared with less salt or no salt, if possible.  What foods are recommended?  The items listed may not be a complete list. Talk with your dietitian about what dietary choices are best for you.  Grains  Whole-grain or whole-wheat bread. Whole-grain or whole-wheat pasta. Brown rice. Oatmeal. Quinoa. Bulgur. Whole-grain and low-sodium cereals. Pita bread. Low-fat, low-sodium crackers. Whole-wheat flour tortillas.  Vegetables  Fresh or frozen vegetables (raw, steamed, roasted, or grilled). Low-sodium or reduced-sodium tomato and vegetable juice. Low-sodium or reduced-sodium tomato sauce and tomato paste. Low-sodium or reduced-sodium canned vegetables.  Fruits  All fresh, dried, or frozen fruit. Canned fruit in natural juice (without  added sugar).  Meat and other protein foods  Skinless chicken or turkey. Ground chicken or turkey. Pork with fat trimmed off. Fish and seafood. Egg whites. Dried beans, peas, or lentils. Unsalted nuts, nut butters, and seeds. Unsalted canned beans. Lean cuts of beef with fat trimmed off. Low-sodium, lean deli meat.  Dairy  Low-fat (1%) or fat-free (skim) milk. Fat-free, low-fat, or reduced-fat cheeses. Nonfat, low-sodium ricotta or cottage cheese. Low-fat or nonfat yogurt. Low-fat, low-sodium cheese.  Fats and oils  Soft margarine without trans fats. Vegetable oil. Low-fat, reduced-fat, or light mayonnaise and salad dressings (reduced-sodium). Canola, safflower, olive, soybean, and sunflower oils. Avocado.  Seasoning and other foods  Herbs. Spices. Seasoning mixes without salt. Unsalted popcorn and pretzels. Fat-free sweets.  What foods are not recommended?  The items listed may not be a complete list. Talk with your dietitian about what dietary choices are best for you.  Grains  Baked goods made with fat, such as croissants, muffins, or some breads. Dry pasta or rice meal packs.  Vegetables  Creamed or fried vegetables. Vegetables in a cheese sauce. Regular canned vegetables (not low-sodium or reduced-sodium). Regular canned tomato sauce and paste (not low-sodium or reduced-sodium). Regular tomato and vegetable juice (not low-sodium or reduced-sodium). Pickles. Olives.  Fruits  Canned fruit in a light or heavy syrup. Fried fruit. Fruit in cream or butter sauce.  Meat and other protein foods  Fatty cuts of meat. Ribs. Fried meat. Bacon. Sausage. Bologna and other processed lunch meats. Salami. Fatback. Hotdogs. Bratwurst. Salted nuts and seeds. Canned beans with added salt. Canned or smoked fish. Whole eggs or egg yolks. Chicken or turkey with skin.  Dairy  Whole or 2% milk, cream, and half-and-half. Whole or full-fat cream cheese. Whole-fat or sweetened yogurt. Full-fat cheese. Nondairy creamers. Whipped toppings.  Processed cheese and cheese spreads.  Fats and oils  Butter. Stick margarine. Lard. Shortening. Ghee. Bacon fat. Tropical oils, such as coconut, palm kernel, or palm oil.  Seasoning and other foods  Salted popcorn and pretzels. Onion salt, garlic salt, seasoned salt, table salt, and sea salt. Worcestershire sauce. Tartar sauce. Barbecue sauce. Teriyaki sauce. Soy sauce, including reduced-sodium. Steak sauce. Canned and packaged gravies. Fish sauce. Oyster sauce. Cocktail sauce. Horseradish that you find on the shelf. Ketchup. Mustard. Meat flavorings and tenderizers. Bouillon cubes. Hot sauce and Tabasco sauce. Premade or packaged marinades. Premade or packaged taco seasonings. Relishes. Regular salad dressings.  Where to find more information:   National Heart, Lung, and Blood Institute: www.nhlbi.nih.gov   American Heart Association: www.heart.org  Summary   The DASH eating plan is a healthy eating plan that has been shown to reduce high blood pressure (hypertension). It may also reduce your risk for type 2 diabetes, heart disease, and stroke.   With the   DASH eating plan, you should limit salt (sodium) intake to 2,300 mg a day. If you have hypertension, you may need to reduce your sodium intake to 1,500 mg a day.   When on the DASH eating plan, aim to eat more fresh fruits and vegetables, whole grains, lean proteins, low-fat dairy, and heart-healthy fats.   Work with your health care provider or diet and nutrition specialist (dietitian) to adjust your eating plan to your individual calorie needs.  This information is not intended to replace advice given to you by your health care provider. Make sure you discuss any questions you have with your health care provider.  Document Released: 05/01/2011 Document Revised: 05/05/2016 Document Reviewed: 05/05/2016  Elsevier Interactive Patient Education  2019 Elsevier Inc.

## 2018-06-11 NOTE — Progress Notes (Signed)
Subjective:  Patient ID: Philip [name],  male    DOB: 28-Oct-1955  Age: 63 y.o.    CC: Medical Management of Chronic Issues   HPI MALIN CERVINI presents for  follow-up of hypertension. Patient has no history of headache chest pain or shortness of breath or recent cough. Patient also denies symptoms of TIA such as numbness weakness lateralizing. Patient denies side effects from medication. States taking it regularly.  Patient also  in for follow-up of elevated cholesterol. Doing well without complaints on current medication. Denies side effects  including myalgia and arthralgia and nausea. Also in today for liver function testing. Currently no chest pain, shortness of breath or other cardiovascular related symptoms noted.  Follow-up of diabetes. Patient does not  check blood sugar at home.  Patient denies symptoms such as excessive hunger or urinary frequency, excessive hunger, nausea No significant hypoglycemic spells noted. Medications reviewed. Pt reports taking them regularly. Pt. denies complication/adverse reaction today.   Pt. Says cirrhosis hasn't acted up for a long time. (Swelling belly pain, jaundice , diarrhea)  Drinking up to 80 oz. Of beer on the weekend   History Kaikoa has a past medical history of Diabetes mellitus without complication ([place]), Erectile dysfunction, GERD (gastroesophageal reflux disease), Headache(784.0), Hepatitis C, History of kidney stones, History of migraine, Hypertension, Hypertension, Nausea, Seizures ([place]), Short-term memory loss, Sleep apnea, Stroke ([place]), and Tobacco user.   He has a past surgical history that includes Umbilical hernia repair (N/A, 03/09/2013); Colonoscopy (06/2014); Esophagogastroduodenoscopy (egd) with propofol (N/A, 08/21/2015); Cholecystectomy (N/A, 03/09/2013); Esophagogastroduodenoscopy (egd) with propofol (N/A, 03/10/2017); and biopsy (03/10/2017).   His family history includes Cancer in his cousin; Colon cancer in his maternal  uncle; Heart disease in his maternal grandfather, maternal grandmother, paternal grandfather, and paternal grandmother; Hypertension in his mother.He reports that he has been smoking cigarettes. He started smoking about 40 years ago. He has a 5.00 pack-year smoking history. He has never used smokeless tobacco. He reports current alcohol use of about 3.0 standard drinks of alcohol per week. He reports that he does not use drugs.  Current Outpatient Medications on File Prior to Visit  Medication Sig Dispense Refill  . aspirin EC 81 MG tablet Take 81 mg by mouth daily.    . fish oil-omega-3 fatty acids 1000 MG capsule Take 2 g by mouth every other day.     Marland Kitchen NEXIUM 40 MG capsule Take 1 capsule (40 mg total) by mouth daily before breakfast. 30 capsule 11   No current facility-administered medications on file prior to visit.     ROS Review of Systems  Neurological: Positive for headaches.    Objective:  BP (!) 142/89   Pulse 85   Temp 98 F (36.7 C) (Oral)   Ht 5' 7" (1.702 m)   Wt 203 lb 6 oz (92.3 kg)   BMI 31.85 kg/m   BP Readings from Last 3 Encounters:  06/11/18 (!) 142/89  09/28/17 (!) 149/97  08/20/17 (!) 152/98    Wt Readings from Last 3 Encounters:  06/11/18 203 lb 6 oz (92.3 kg)  09/28/17 201 lb (91.2 kg)  08/20/17 202 lb 6.4 oz (91.8 kg)     Physical Exam Constitutional:      General: He is not in acute distress.    Appearance: He is well-developed.  HENT:     Head: Normocephalic and atraumatic.     Right Ear: External ear normal.     Left Ear: External ear normal.  Nose: Nose normal.  Eyes:     Conjunctiva/sclera: Conjunctivae normal.     Pupils: Pupils are equal, round, and reactive to light.  Neck:     Musculoskeletal: Normal range of motion and neck supple.  Cardiovascular:     Rate and Rhythm: Normal rate and regular rhythm.     Heart sounds: Normal heart sounds. No murmur.  Pulmonary:     Effort: Pulmonary effort is normal. No respiratory  distress.     Breath sounds: Normal breath sounds. No wheezing or rales.  Abdominal:     General: There is distension (protuberant without dullness).     Palpations: Abdomen is soft. There is no mass.     Tenderness: There is no abdominal tenderness. There is no guarding or rebound.  Musculoskeletal: Normal range of motion.  Skin:    General: Skin is warm and dry.  Neurological:     Mental Status: He is alert and oriented to person, place, and time.     Deep Tendon Reflexes: Reflexes are normal and symmetric.  Psychiatric:        Behavior: Behavior normal.        Thought Content: Thought content normal.        Judgment: Judgment normal.     Diabetic Foot Exam - Simple   Simple Foot Form Diabetic Foot exam was performed with the following findings:  Yes 06/11/2018  1:39 PM  Visual Inspection No deformities, no ulcerations, no other skin breakdown bilaterally:  Yes Sensation Testing Intact to touch and monofilament testing bilaterally:  Yes Pulse Check Posterior Tibialis and Dorsalis pulse intact bilaterally:  Yes Comments       Assessment & Plan:   Kamdyn was seen today for medical management of chronic issues.  Diagnoses and all orders for this visit:  Type 2 diabetes mellitus with complication, without long-term current use of insulin (HCC) -     Microalbumin / creatinine urine ratio -     Bayer DCA Hb A1c Waived -     Urinalysis  Essential hypertension  Hyperlipidemia, unspecified hyperlipidemia type -     CBC with Differential/Platelet -     CMP14+EGFR -     Lipid panel  Cirrhosis of liver without ascites, unspecified hepatic cirrhosis type ([place]) -     Ammonia  Other orders -     amLODipine (NORVASC) 10 MG tablet; Take 1 tablet (10 mg total) by mouth daily. -     gabapentin (NEURONTIN) 300 MG capsule; TAKE 1 CAPSULE BY MOUTH AT BEDTIME -     glipiZIDE (GLUCOTROL) 5 MG tablet; TAKE 1 TABLET BY MOUTH ONCE DAILY BEFORE BREAKFAST -     hydrALAZINE (APRESOLINE)  25 MG tablet; TAKE 1 TABLET BY MOUTH THREE TIMES DAILY -     metoprolol succinate (TOPROL-XL) 50 MG 24 hr tablet; Take 1 tablet (50 mg total) by mouth daily. Take with or immediately following a meal. -     topiramate (TOPAMAX) 25 MG tablet; Take 1 tablet (25 mg total) by mouth 2 (two) times daily.   I have changed Cobain B. Cristina's amLODipine, gabapentin, glipiZIDE, hydrALAZINE, and topiramate. I am also having him maintain his fish oil-omega-3 fatty acids, aspirin EC, NEXIUM, and metoprolol succinate.  Meds ordered this encounter  Medications  . amLODipine (NORVASC) 10 MG tablet    Sig: Take 1 tablet (10 mg total) by mouth daily.    Dispense:  30 tablet    Refill:  2  . gabapentin (NEURONTIN) 300 MG  capsule    Sig: TAKE 1 CAPSULE BY MOUTH AT BEDTIME    Dispense:  30 capsule    Refill:  2    Please consider 90 day supplies to promote better adherence  . glipiZIDE (GLUCOTROL) 5 MG tablet    Sig: TAKE 1 TABLET BY MOUTH ONCE DAILY BEFORE BREAKFAST    Dispense:  30 tablet    Refill:  2    Please consider 90 day supplies to promote better adherence  . hydrALAZINE (APRESOLINE) 25 MG tablet    Sig: TAKE 1 TABLET BY MOUTH THREE TIMES DAILY    Dispense:  90 tablet    Refill:  2    Please consider 90 day supplies to promote better adherence  . metoprolol succinate (TOPROL-XL) 50 MG 24 hr tablet    Sig: Take 1 tablet (50 mg total) by mouth daily. Take with or immediately following a meal.    Dispense:  30 tablet    Refill:  2  . topiramate (TOPAMAX) 25 MG tablet    Sig: Take 1 tablet (25 mg total) by mouth 2 (two) times daily.    Dispense:  60 tablet    Refill:  2     Follow-up: Return in about 3 months (around 09/10/2018).  [name], M.D.

## 2018-06-12 LAB — CBC WITH DIFFERENTIAL/PLATELET
BASOS: 1 %
Basophils Absolute: 0.1 10*3/uL (ref 0.0–0.2)
EOS (ABSOLUTE): 0.7 10*3/uL — AB (ref 0.0–0.4)
EOS: 12 %
HEMATOCRIT: 43.9 % (ref 37.5–51.0)
Hemoglobin: 15.5 g/dL (ref 13.0–17.7)
IMMATURE GRANS (ABS): 0 10*3/uL (ref 0.0–0.1)
IMMATURE GRANULOCYTES: 0 %
LYMPHS: 26 %
Lymphocytes Absolute: 1.6 10*3/uL (ref 0.7–3.1)
MCH: 31.3 pg (ref 26.6–33.0)
MCHC: 35.3 g/dL (ref 31.5–35.7)
MCV: 89 fL (ref 79–97)
MONOS ABS: 0.9 10*3/uL (ref 0.1–0.9)
Monocytes: 14 %
NEUTROS PCT: 47 %
Neutrophils Absolute: 3 10*3/uL (ref 1.4–7.0)
Platelets: 121 10*3/uL — ABNORMAL LOW (ref 150–450)
RBC: 4.96 x10E6/uL (ref 4.14–5.80)
RDW: 13.6 % (ref 11.6–15.4)
WBC: 6.4 10*3/uL (ref 3.4–10.8)

## 2018-06-12 LAB — CMP14+EGFR
A/G RATIO: 1.6 (ref 1.2–2.2)
ALT: 30 IU/L (ref 0–44)
AST: 30 IU/L (ref 0–40)
Albumin: 4.4 g/dL (ref 3.6–4.8)
Alkaline Phosphatase: 89 IU/L (ref 39–117)
BILIRUBIN TOTAL: 0.3 mg/dL (ref 0.0–1.2)
BUN / CREAT RATIO: 7 — AB (ref 10–24)
BUN: 10 mg/dL (ref 8–27)
CALCIUM: 9.8 mg/dL (ref 8.6–10.2)
CHLORIDE: 104 mmol/L (ref 96–106)
CO2: 22 mmol/L (ref 20–29)
Creatinine, Ser: 1.36 mg/dL — ABNORMAL HIGH (ref 0.76–1.27)
GFR, EST AFRICAN AMERICAN: 64 mL/min/{1.73_m2} (ref >59)
GFR, EST NON AFRICAN AMERICAN: 55 mL/min/{1.73_m2} — AB (ref >59)
GLOBULIN, TOTAL: 2.8 g/dL (ref 1.5–4.5)
Glucose: 54 mg/dL — ABNORMAL LOW (ref 65–99)
POTASSIUM: 4.2 mmol/L (ref 3.5–5.2)
SODIUM: 142 mmol/L (ref 134–144)
TOTAL PROTEIN: 7.2 g/dL (ref 6.0–8.5)

## 2018-06-12 LAB — AMMONIA: Ammonia: 79 ug/dL (ref 27–102)

## 2018-06-12 LAB — LIPID PANEL
CHOL/HDL RATIO: 3.2 ratio (ref 0.0–5.0)
Cholesterol, Total: 177 mg/dL (ref 100–199)
HDL: 56 mg/dL (ref >39)
LDL Calculated: 95 mg/dL (ref 0–99)
Triglycerides: 132 mg/dL (ref 0–149)
VLDL Cholesterol Cal: 26 mg/dL (ref 5–40)

## 2018-06-24 ENCOUNTER — Encounter: Payer: Self-pay | Admitting: Gastroenterology

## 2018-06-24 ENCOUNTER — Ambulatory Visit (INDEPENDENT_AMBULATORY_CARE_PROVIDER_SITE_OTHER): Payer: Medicaid Other | Admitting: Gastroenterology

## 2018-06-24 ENCOUNTER — Ambulatory Visit: Payer: Medicaid Other | Admitting: Nurse Practitioner

## 2018-06-24 DIAGNOSIS — A048 Other specified bacterial intestinal infections: Secondary | ICD-10-CM | POA: Diagnosis not present

## 2018-06-24 DIAGNOSIS — K7030 Alcoholic cirrhosis of liver without ascites: Secondary | ICD-10-CM | POA: Diagnosis not present

## 2018-06-24 NOTE — Progress Notes (Signed)
Subjective:    Patient ID: Philip [name], male    DOB: 21-Sep-1955, 63 y.o.   MRN: [id number]  Philip Fraise, MD   HPI BMs: good. APPETITE: PRETTY GOOD. ETOH: 1 A DAY OR EVERY OTHER DAY. NO THC. USES CPAP PRN. GETS SCARED WHEN IT GOES OFF. NO SWELLING IN LEGS OR ABDOMEN. EYES THE SAME.   PT DENIES FEVER, CHILLS, HEMATOCHEZIA, HEMATEMESIS, nausea, vomiting, melena, diarrhea, CHEST PAIN, SHORTNESS OF BREATH,  CHANGE IN BOWEL IN HABITS, constipation, abdominal pain, problems swallowing,  OR heartburn or indigestion.  Past Medical History:  Diagnosis Date  . Diabetes mellitus without complication (HCC)    borderline  . Erectile dysfunction   . GERD (gastroesophageal reflux disease)   . Headache(784.0)    "Headache, I dont think they are migranes"  . Hepatitis C    [place], completed 06/2015 (Dr. Linus Salmons) , AUG 2017 SVR  . History of kidney stones    history of kidney stones  . History of migraine    takes Topamax daily  . Hypertension    takes Hydralazine,Maxzide,Clonidine,and Amlodipine daily  . Hypertension    also takes Metoprolol daily  . Nausea    takes Zofran daily  . Seizures ([place])    off Dilantin daily.  last one was when he had a stroke.  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 Sleep apnea    CPAP- tries to do it everyday.  . Stroke ([place])    .  Short term memory loss.; left sided weakness  . Tobacco user    Past Surgical History:  Procedure Laterality Date  . BIOPSY  03/10/2017   Procedure: BIOPSY;  Surgeon: Danie Binder, MD;  Location: [facility];  Service: Endoscopy;;  gastric biopsy   . CHOLECYSTECTOMY N/A 03/09/2013     . COLONOSCOPY  06/2014   Dr. Olevia Perches: Three sessile polyps were found in the rectum x2 and descending. tubular adenomas. colonoscopy in 06/2019  . ESOPHAGOGASTRODUODENOSCOPY (EGD) WITH PROPOFOL N/A 08/21/2015   Dr. Oneida Alar: normal esophagus, non-bleeding gastric ulcers/gastritis (H.pylori)   . ESOPHAGOGASTRODUODENOSCOPY (EGD) WITH  PROPOFOL N/A 03/10/2017     . UMBILICAL HERNIA REPAIR N/A 03/09/2013   Procedure: HERNIA REPAIR UMBILICAL ADULT;  Surgeon: Imogene Burn. Tsuei, MD;  Location: [facility]    No Known Allergies  Current Outpatient Medications  Medication Sig    . amLODipine (NORVASC) 10 MG tablet Take 1 tablet (10 mg total) by mouth daily.    Marland Kitchen aspirin EC 81 MG tablet Take 81 mg by mouth daily.    . fish oil-omega-3 fatty acids 1000 MG capsule Take 2 g by mouth every other day.     . gabapentin (NEURONTIN) 300 MG capsule TAKE 1 CAPSULE BY MOUTH AT BEDTIME    . hydrALAZINE (APRESOLINE) 25 MG tablet TAKE 1 TABLET BY MOUTH THREE TIMES DAILY    . metoprolol succinate (TOPROL-XL) 50 MG 24 hr tablet Take 1 tablet (50 mg total) by mouth daily. Take with or immediately following a meal.    . NEXIUM 40 MG capsule Take 1 capsule (40 mg total) by mouth daily before breakfast.    . topiramate (TOPAMAX) 25 MG tablet Take 1 tablet (25 mg total) by mouth 2 (two) times daily.     Review of Systems PER HPI OTHERWISE ALL SYSTEMS ARE NEGATIVE.    Objective:   Physical Exam Vitals signs reviewed.  Constitutional:      General: He is not in acute  distress.    Appearance: He is well-developed.  HENT:     Head: Normocephalic and atraumatic.     Mouth/Throat:     Pharynx: No oropharyngeal exudate.  Eyes:     General: No scleral icterus.    Pupils: Pupils are equal, round, and reactive to light.  Neck:     Musculoskeletal: Normal range of motion and neck supple.  Cardiovascular:     Rate and Rhythm: Normal rate and regular rhythm.     Heart sounds: Normal heart sounds.  Pulmonary:     Effort: Pulmonary effort is normal. No respiratory distress.     Breath sounds: Normal breath sounds.  Abdominal:     General: Bowel sounds are normal. There is no distension.     Palpations: Abdomen is soft.     Tenderness: There is no abdominal tenderness.     Comments: obese  Musculoskeletal:     Right lower leg: No edema.     Left lower  leg: No edema.  Lymphadenopathy:     Cervical: No cervical adenopathy.  Skin:    General: Skin is warm and dry.  Neurological:     General: No focal deficit present.     Mental Status: He is alert and oriented to person, place, and time. Mental status is at baseline.  Psychiatric:        Mood and Affect: Mood normal.        Behavior: Behavior normal.       Assessment & Plan:

## 2018-06-24 NOTE — Addendum Note (Signed)
Addended by: Danie Binder on: 06/24/2018 11:32 AM   Modules accepted: Orders

## 2018-06-24 NOTE — Assessment & Plan Note (Signed)
Complete h pylori breath test to confirm eradication.

## 2018-06-24 NOTE — Patient Instructions (Addendum)
TO KEEP YOUR LIVER FROMGETTING WORSE:    1. CONTINUE YOUR WEIGHT LOSS EFFORTS. YOUR BODY MASS INDEX IS OVER 30 WHICH MEANS YOU ARE OBESE. OBESITY IS ASSOCIATED WITH MAKING YOUR LIVER WORSE AND ALL CANCERS, INCLUDING ESOPHAGEAL AND COLON CANCER. A WEIGHT OF 190 LBS OR LESS  WILL GET YOUR BODY MASS INDEX(BMI) UNDER 30.   2. AVOID DRINKING ALCOHOL.  CONTINUE NEXIUM. TAKE 30 MINUTES BEFORE A MEAL ONCE DAILY.   COMPLETE AN ULTRASOUND EVERY 6 MOS TO SCREEN FOR LIVER CANCER.  Complete H pylori breath test. YOU WILL NEED TO HOLD YOUR NEXIUM 2 WEEKS PRIOR TO THE TEST.   FOLLOW UP IN 6 MOS. YOU WILL NEED LABS 1 WEEK PRIOR TO YOUR NEXT VISIT.

## 2018-06-24 NOTE — Assessment & Plan Note (Addendum)
WELL COMPENSATED DISEASE.  TO KEEP YOUR LIVER FROMGETTING WORSE:    1. CONTINUE YOUR WEIGHT LOSS EFFORTS. YOUR BODY MASS INDEX IS OVER 30 WHICH MEANS YOU ARE OBESE. OBESITY IS ASSOCIATED WITH MAKING YOUR LIVER WORSE AND ALL CANCERS, INCLUDING ESOPHAGEAL AND COLON CANCER. A WEIGHT OF 190 LBS OR LESS  WILL GET YOUR BODY MASS INDEX(BMI) UNDER 30.   2. AVOID DRINKING ALCOHOL.  CONTINUE NEXIUM. TAKE 30 MINUTES BEFORE A MEAL ONCE DAILY.  COMPLETE AN ULTRASOUND EVERY 6 MOS TO SCREEN FOR LIVER CANCER.  FOLLOW UP IN 6 MOS. YOU WILL NEED LABS 1 WEEK PRIOR TO YOUR NEXT VISIT(cmp/cbc/pt/inr).

## 2018-07-07 ENCOUNTER — Telehealth: Payer: Self-pay | Admitting: General Practice

## 2018-07-07 NOTE — Telephone Encounter (Signed)
Please see orders listed below from Dr Oneida Alar  Clinical POOl: [place].  Stacey:FOLLOW UP IN 6 MOS.   Doris: YOU WILL NEED LABS 1 WEEK PRIOR TO YOUR NEXT VISIT(cmp/cbc/pt/inr).

## 2018-07-08 ENCOUNTER — Other Ambulatory Visit: Payer: Self-pay

## 2018-07-08 DIAGNOSIS — K7460 Unspecified cirrhosis of liver: Secondary | ICD-10-CM

## 2018-07-08 NOTE — Telephone Encounter (Signed)
Lab orders on file for 12/23/2018.

## 2018-07-08 NOTE — Telephone Encounter (Signed)
U/s scheduled for 07/14/2018 at 9:30am, arrival time 9:15am, npo after midnight. Called patient and he is aware of appt details.  PA for u/s approved via [employer] website. IOEV#[id number] dates 07/08/2018-08/07/2018.

## 2018-07-09 ENCOUNTER — Encounter: Payer: Self-pay | Admitting: Gastroenterology

## 2018-07-09 NOTE — Telephone Encounter (Signed)
SCHEDULED AND LETTER SENT AND ON RECALL FOR 6 MONTH ULTRASOUND

## 2018-07-14 ENCOUNTER — Ambulatory Visit (HOSPITAL_COMMUNITY): Payer: Medicaid Other

## 2018-07-14 NOTE — Telephone Encounter (Signed)
REVIEWED-NO ADDITIONAL RECOMMENDATIONS. 

## 2018-07-21 ENCOUNTER — Other Ambulatory Visit: Payer: Self-pay

## 2018-07-21 DIAGNOSIS — K7460 Unspecified cirrhosis of liver: Secondary | ICD-10-CM

## 2018-07-28 ENCOUNTER — Ambulatory Visit (HOSPITAL_COMMUNITY)
Admission: RE | Admit: 2018-07-28 | Discharge: 2018-07-28 | Disposition: A | Discharge status: Home / Self Care | Payer: Medicaid Other | Source: Ambulatory Visit | Attending: Gastroenterology | Admitting: Gastroenterology

## 2018-07-28 DIAGNOSIS — K7030 Alcoholic cirrhosis of liver without ascites: Secondary | ICD-10-CM | POA: Insufficient documentation

## 2018-07-28 DIAGNOSIS — K7460 Unspecified cirrhosis of liver: Secondary | ICD-10-CM | POA: Diagnosis not present

## 2018-08-03 ENCOUNTER — Telehealth: Payer: Self-pay | Admitting: Gastroenterology

## 2018-08-03 NOTE — Telephone Encounter (Signed)
PLEASE CALL PT. HIS U/S SHOWS FAT IN HIS LIVER DUE TO BEING OVERWEIGHT AND ETOH USE. IT ALSO SHOW CIRRHOSIS, WHICH CANGET WORSE IF HE DOESN'T STOP DRINKING ETOH AND CONTINUES TO CARRY TOO MUCH WEIGHT.   HE SHOULD: 1. AVOID ETOH. 2. LOSE WEIGHT TO 190 LBS OR LESS. 3. REPEAT U/S IN 6 MOS.

## 2018-08-03 NOTE — Telephone Encounter (Signed)
Pt is aware.  

## 2018-08-04 ENCOUNTER — Encounter: Payer: Self-pay | Admitting: Gastroenterology

## 2018-08-05 NOTE — Telephone Encounter (Signed)
Reminder in epic °

## 2018-08-10 ENCOUNTER — Other Ambulatory Visit: Payer: Self-pay | Admitting: *Deleted

## 2018-08-10 MED ORDER — GABAPENTIN 300 MG PO CAPS: ORAL_CAPSULE | ORAL | 0 refills | Status: DC

## 2018-08-11 ENCOUNTER — Telehealth: Payer: Self-pay

## 2018-08-11 NOTE — Telephone Encounter (Signed)
I called [employer] and spoke to [place]. She said pt must try and fail 2 meds prior to approval for the Nexium 40 mg daily. I spoke to pt. He doesn't know anything else he has tried. Sister said he had only tried the esomeprazole. Per Vikki Ports he must try and fail Protonix or pantoprazole, Omeprazole o Nexium 20 mg before it will be covered. Dr. Oneida Alar, please advise!

## 2018-08-12 MED ORDER — PANTOPRAZOLE SODIUM 40 MG PO TBEC: DELAYED_RELEASE_TABLET | ORAL | 11 refills | Status: DC

## 2018-08-12 NOTE — Telephone Encounter (Signed)
Pt's sister, Hassan Rowan, is aware.

## 2018-08-12 NOTE — Telephone Encounter (Signed)
PLEASE CALL PT. [employer] INSURANCE SAYS HE MUST TRY AN ALTERNATIVE TO RX STRENGTH NEXIUM. SENT RX FOR PROTONIX ONCE DAILY.

## 2018-09-13 ENCOUNTER — Other Ambulatory Visit: Payer: Self-pay

## 2018-09-13 ENCOUNTER — Encounter: Payer: Self-pay | Admitting: Family Medicine

## 2018-09-13 ENCOUNTER — Ambulatory Visit (INDEPENDENT_AMBULATORY_CARE_PROVIDER_SITE_OTHER): Payer: Medicaid Other | Admitting: Family Medicine

## 2018-09-13 DIAGNOSIS — I10 Essential (primary) hypertension: Secondary | ICD-10-CM | POA: Diagnosis not present

## 2018-09-13 DIAGNOSIS — K7030 Alcoholic cirrhosis of liver without ascites: Secondary | ICD-10-CM

## 2018-09-13 DIAGNOSIS — R51 Headache: Secondary | ICD-10-CM | POA: Diagnosis not present

## 2018-09-13 DIAGNOSIS — N529 Male erectile dysfunction, unspecified: Secondary | ICD-10-CM

## 2018-09-13 DIAGNOSIS — R519 Headache, unspecified: Secondary | ICD-10-CM

## 2018-09-13 DIAGNOSIS — E119 Type 2 diabetes mellitus without complications: Secondary | ICD-10-CM

## 2018-09-13 MED ORDER — TRAMADOL HCL 50 MG PO TABS: 50.0000 mg | ORAL_TABLET | Freq: Four times a day (QID) | ORAL | 0 refills | Status: AC | PRN

## 2018-09-13 MED ORDER — TOPIRAMATE 50 MG PO TABS: 50.0000 mg | ORAL_TABLET | Freq: Two times a day (BID) | ORAL | 1 refills | Status: DC

## 2018-09-13 MED ORDER — SILDENAFIL CITRATE 20 MG PO TABS: ORAL_TABLET | ORAL | 3 refills | Status: DC

## 2018-09-13 NOTE — Progress Notes (Signed)
Subjective:  Patient ID: Philip [name], male    DOB: 1955-09-19  Age: 63 y.o. MRN: [id number]  CC: No chief complaint on file.   HPI Philip [name] presents for follow up of elevated BP. Denies side effects of meds. Taking them all regularly. Checking BP regularly. Can be up to 782-956 systolic. Switched to [place] bacon. Now running 120s. Has run higher when drinking a beer too. Has cirrhosis. Has cut back on alcohol consumption, but not quit.  Follow-up of diabetes. Patient not checking blood sugar at home.   Patient denies symptoms such as polyuria, polydipsia, excessive hunger, nausea No significant hypoglycemic spells noted. Medications reviewed. Pt reports taking them regularly without complication/adverse reaction being reported today.  Checking feet daily.  Needs his sidenafil refilled. Works well for his erectile dysfunction.  Using tramadol for migraine relief. HA less frequent, but needs refill due to occasional pain. 5-6/10. All over head. PDMP shows no activity recently. Last bottle lasted him 2 years. Previous scrips noted in med history.   History Philip [name] has a past medical history of Diabetes mellitus without complication ([place]), Erectile dysfunction, GERD (gastroesophageal reflux disease), Headache(784.0), Hepatitis C, History of kidney stones, History of migraine, Hypertension, Hypertension, Nausea, Seizures ([place]), Short-term memory loss, Sleep apnea, Stroke ([place]), and Tobacco user.   He has a past surgical history that includes Umbilical hernia repair (N/A, 03/09/2013); Colonoscopy (06/2014); Esophagogastroduodenoscopy (egd) with propofol (N/A, 08/21/2015); Cholecystectomy (N/A, 03/09/2013); Esophagogastroduodenoscopy (egd) with propofol (N/A, 03/10/2017); and biopsy (03/10/2017).   His family history includes Cancer in his cousin; Colon cancer in his maternal uncle; Heart disease in his maternal grandfather, maternal grandmother, paternal grandfather, and paternal grandmother;  Hypertension in his mother.He reports that he has been smoking cigarettes. He started smoking about 40 years ago. He has a 5.00 pack-year smoking history. He has never used smokeless tobacco. He reports current alcohol use of about 3.0 standard drinks of alcohol per week. He reports that he does not use drugs.  Current Outpatient Medications on File Prior to Visit  Medication Sig Dispense Refill  . amLODipine (NORVASC) 10 MG tablet Take 1 tablet (10 mg total) by mouth daily. 30 tablet 2  . aspirin EC 81 MG tablet Take 81 mg by mouth daily.    . fish oil-omega-3 fatty acids 1000 MG capsule Take 2 g by mouth every other day.     . gabapentin (NEURONTIN) 300 MG capsule TAKE 1 CAPSULE BY MOUTH AT BEDTIME 90 capsule 0  . hydrALAZINE (APRESOLINE) 25 MG tablet TAKE 1 TABLET BY MOUTH THREE TIMES DAILY 90 tablet 2  . metoprolol succinate (TOPROL-XL) 50 MG 24 hr tablet Take 1 tablet (50 mg total) by mouth daily. Take with or immediately following a meal. 30 tablet 2  . NEXIUM 40 MG capsule Take 1 capsule (40 mg total) by mouth daily before breakfast. 30 capsule 11  . pantoprazole (PROTONIX) 40 MG tablet 1 po 30 mins prior to first meal 30 tablet 11   No current facility-administered medications on file prior to visit.     ROS Review of Systems  Constitutional: Negative.   HENT: Negative.   Eyes: Negative for visual disturbance.  Respiratory: Negative for cough and shortness of breath.   Cardiovascular: Negative for chest pain and leg swelling.  Gastrointestinal: Negative for abdominal pain, diarrhea, nausea and vomiting.  Genitourinary: Negative for difficulty urinating.  Musculoskeletal: Negative for arthralgias and myalgias.  Skin: Negative for rash.  Neurological: Positive for headaches.  Psychiatric/Behavioral: Negative for  sleep disturbance.    Objective:  There were no vitals taken for this visit.  BP Readings from Last 3 Encounters:  06/24/18 (!) 144/92  06/11/18 (!) 142/89   09/28/17 (!) 149/97    Wt Readings from Last 3 Encounters:  06/24/18 200 lb 9.6 oz (91 kg)  06/11/18 203 lb 6 oz (92.3 kg)  09/28/17 201 lb (91.2 kg)     Physical Exam  Exam deferred. Pt. [place]ing due to COVID 19. Phone visit performed.   Assessment & Plan:   Diagnoses and all orders for this visit:  Type 2 diabetes mellitus without complication, without long-term current use of insulin (HCC)  Alcoholic cirrhosis of liver without ascites (HCC)  Essential hypertension  Chronic nonintractable headache, unspecified headache type  Erectile dysfunction, unspecified erectile dysfunction type  Other orders -     topiramate (TOPAMAX) 50 MG tablet; Take 1 tablet (50 mg total) by mouth 2 (two) times daily. For headache prevention -     sildenafil (REVATIO) 20 MG tablet; Take 2 to 5 tablets once daily as needed for erectile dysfunction -     traMADol (ULTRAM) 50 MG tablet; Take 1 tablet (50 mg total) by mouth 4 (four) times daily as needed for up to 5 days for moderate pain.      I have changed Philip [name]'s topiramate. I am also having him start on traMADol. Additionally, I am having him maintain his fish oil-omega-3 fatty acids, aspirin EC, NexIUM, amLODipine, hydrALAZINE, metoprolol succinate, gabapentin, pantoprazole, and sildenafil.  Meds ordered this encounter  Medications  . topiramate (TOPAMAX) 50 MG tablet    Sig: Take 1 tablet (50 mg total) by mouth 2 (two) times daily. For headache prevention    Dispense:  180 tablet    Refill:  1  . sildenafil (REVATIO) 20 MG tablet    Sig: Take 2 to 5 tablets once daily as needed for erectile dysfunction    Dispense:  20 tablet    Refill:  3  . traMADol (ULTRAM) 50 MG tablet    Sig: Take 1 tablet (50 mg total) by mouth 4 (four) times daily as needed for up to 5 days for moderate pain.    Dispense:  30 tablet    Refill:  0   Checking SBP, should stay under 135.  Also advised regarding alcohol consumption. Needs to DC  completely.  Virtual Visit via telephone Note  I discussed the limitations, risks, security and privacy concerns of performing an evaluation and management service by telephone and the availability of in person appointments. I also discussed with the patient that there may be a patient responsible charge related to this service. The patient expressed understanding and agreed to proceed. Pt. Is at home. Dr. Livia Snellen is in his office.  Follow Up Instructions:   I discussed the assessment and treatment plan with the patient. The patient was provided an opportunity to ask questions and all were answered. The patient agreed with the plan and demonstrated an understanding of the instructions.   The patient was advised to call back or seek an in-person evaluation if the symptoms worsen or if the condition fails to improve as anticipated.  Visit started: 12:45 Call ended:  1:17 Total minutes including chart review and phone contact time: 32   Follow-up: No follow-ups on file.  [name], M.D.

## 2018-09-14 ENCOUNTER — Telehealth: Payer: Self-pay

## 2018-09-14 ENCOUNTER — Other Ambulatory Visit: Payer: Self-pay | Admitting: *Deleted

## 2018-09-14 MED ORDER — SILDENAFIL CITRATE 20 MG PO TABS: ORAL_TABLET | ORAL | 3 refills | Status: DC

## 2018-09-14 NOTE — Telephone Encounter (Signed)
Insurance has declined to pay for Tramadol prescription written on 09/13/2018.  They will pay for:  [place] Patch Embeda ER Capsule MS Contin Tramadol ER tablet (generic for Ultra ER) Xtampza ER

## 2018-09-21 ENCOUNTER — Ambulatory Visit: Payer: Medicaid Other | Admitting: Nurse Practitioner

## 2018-09-21 ENCOUNTER — Other Ambulatory Visit: Payer: Self-pay

## 2018-09-21 ENCOUNTER — Telehealth: Payer: Self-pay

## 2018-09-21 NOTE — Telephone Encounter (Signed)
Tried to call pt at 1:30pm to start Facetime visit w/EG, no answer, LMOAM for return call. Pt hadn't called back as of 2:15pm.  Erline Levine, please no show pt for Facetime visit.

## 2018-09-21 NOTE — Telephone Encounter (Signed)
Noted  

## 2018-09-21 NOTE — Progress Notes (Signed)
Patient no show or cancelled 

## 2018-09-22 ENCOUNTER — Encounter: Payer: Self-pay | Admitting: Gastroenterology

## 2018-09-23 NOTE — Telephone Encounter (Signed)
DONE

## 2018-10-08 ENCOUNTER — Ambulatory Visit: Payer: Medicaid Other | Admitting: Gastroenterology

## 2018-10-18 ENCOUNTER — Other Ambulatory Visit: Payer: Self-pay | Admitting: Family Medicine

## 2018-11-02 ENCOUNTER — Other Ambulatory Visit: Payer: Self-pay | Admitting: *Deleted

## 2018-11-02 ENCOUNTER — Encounter: Payer: Self-pay | Admitting: *Deleted

## 2018-11-02 DIAGNOSIS — K7460 Unspecified cirrhosis of liver: Secondary | ICD-10-CM

## 2018-11-11 ENCOUNTER — Other Ambulatory Visit: Payer: Self-pay | Admitting: Family Medicine

## 2018-12-20 ENCOUNTER — Other Ambulatory Visit: Payer: Self-pay

## 2018-12-20 ENCOUNTER — Telehealth: Payer: Self-pay | Admitting: Gastroenterology

## 2018-12-20 DIAGNOSIS — K7460 Unspecified cirrhosis of liver: Secondary | ICD-10-CM

## 2018-12-20 NOTE — Telephone Encounter (Signed)
Letter mailed

## 2018-12-20 NOTE — Telephone Encounter (Signed)
RECALL FOR ULTRASOUND 

## 2018-12-27 ENCOUNTER — Other Ambulatory Visit: Payer: Self-pay | Admitting: Family Medicine

## 2019-01-01 ENCOUNTER — Other Ambulatory Visit: Payer: Self-pay | Admitting: Family Medicine

## 2019-01-07 ENCOUNTER — Ambulatory Visit: Payer: Medicaid Other | Admitting: Nurse Practitioner

## 2019-01-11 ENCOUNTER — Ambulatory Visit: Payer: Medicaid Other | Admitting: Nurse Practitioner

## 2019-01-11 NOTE — Progress Notes (Deleted)
Referring Provider: [name], MD Primary Care Physician:  [name], MD Primary GI:  Dr. Oneida Alar  No chief complaint on file.   HPI:   Philip [name] is a 63 y.o. male who presents for follow-up on cirrhosis.  The patient was last seen in our office 11/20/3149 for alcoholic cirrhosis without ascites and H. Pylori.  At that time he admitted continue alcohol once every day or every other day.  No edema, no jaundice.  No other concerning GI symptoms.  Commended H. pylori breath test to confirm eradication.  Liver disease deemed well compensated.  Recommended continue weight loss efforts, avoid alcohol, continue Nexium, ultrasound every 6 months for liver cancer.  Recommended follow-up in 6 months with labs 1 week prior.  Right upper quadrant ultrasound completed 07/28/2018 which found consistent with cirrhosis but no focal liver lesion.  Review of labs in our system demonstrates it does not appear he completed H. pylori breath testing.  He has had 2 sets of cirrhosis labs entered into the system (11/02/2018 and 12/20/2018) but neither have been drawn yet.  Today he states   Past Medical History:  Diagnosis Date  . Diabetes mellitus without complication (HCC)    borderline  . Erectile dysfunction   . GERD (gastroesophageal reflux disease)   . Headache(784.0)    "Headache, I dont think they are migranes"  . Hepatitis C    [place], completed 06/2015 (Dr. Linus Salmons) , AUG 2017 SVR  . History of kidney stones    history of kidney stones  . History of migraine    takes Topamax daily  . Hypertension    takes Hydralazine,Maxzide,Clonidine,and Amlodipine daily  . Hypertension    also takes Metoprolol daily  . Nausea    takes Zofran daily  . Seizures ([place])    off Dilantin daily.  last one was when he had a stroke.  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 Sleep apnea    CPAP- tries to do it everyday.  . Stroke ([place])    .  Short term memory loss.; left sided weakness  . Tobacco  user     Past Surgical History:  Procedure Laterality Date  . BIOPSY  03/10/2017   Procedure: BIOPSY;  Surgeon: Danie Binder, MD;  [facility];  Service: Endoscopy;;  gastric biopsy   . CHOLECYSTECTOMY N/A 03/09/2013   Procedure: LAPAROSCOPIC CHOLECYSTECTOMY WITH INTRAOPERATIVE CHOLANGIOGRAM;  Surgeon: Imogene Burn. Georgette Dover, MD;  Location: [facility];  Service: General;  Laterality: N/A;  . COLONOSCOPY  06/2014   Dr. Olevia Perches: Three sessile polyps were found in the rectum x2 and descending. tubular adenomas. colonoscopy in 06/2019  . ESOPHAGOGASTRODUODENOSCOPY (EGD) WITH PROPOFOL N/A 08/21/2015   Dr. Oneida Alar: normal esophagus, non-bleeding gastric ulcers/gastritis (H.pylori)   . ESOPHAGOGASTRODUODENOSCOPY (EGD) WITH PROPOFOL N/A 03/10/2017   Procedure: ESOPHAGOGASTRODUODENOSCOPY (EGD) WITH PROPOFOL;  Surgeon: Danie Binder, MD;  [facility];  Service: Endoscopy;  Laterality: N/A;  1:00pm-rescheduled to 10\16 @ 7:61YW   . UMBILICAL HERNIA REPAIR N/A 03/09/2013   Procedure: HERNIA REPAIR UMBILICAL ADULT;  Surgeon: Imogene Burn. Georgette Dover, MD;  Location: [facility];  Service: General;  Laterality: N/A;    Current Outpatient Medications  Medication Sig Dispense Refill  . amLODipine (NORVASC) 10 MG tablet Take 1 tablet by mouth once daily 90 tablet 1  . aspirin EC 81 MG tablet Take 81 mg by mouth daily.    . fish oil-omega-3 fatty acids 1000  MG capsule Take 2 g by mouth every other day.     . gabapentin (NEURONTIN) 300 MG capsule Take 1 capsule by mouth at bedtime 90 capsule 0  . hydrALAZINE (APRESOLINE) 25 MG tablet TAKE 1 TABLET BY MOUTH THREE TIMES DAILY 90 tablet 0  . metoprolol succinate (TOPROL-XL) 50 MG 24 hr tablet TAKE 1 TABLET BY MOUTH ONCE DAILY. TAKE WITH OR IMMEDIATELY FOLLOWING A MEAL 90 tablet 1  . NEXIUM 40 MG capsule Take 1 capsule (40 mg total) by mouth daily before breakfast. 30 capsule 11  . pantoprazole (PROTONIX) 40 MG tablet 1 po 30 mins prior to first meal 30 tablet  11  . sildenafil (REVATIO) 20 MG tablet Take 2 to 5 tablets once daily as needed for erectile dysfunction 20 tablet 3  . topiramate (TOPAMAX) 50 MG tablet Take 1 tablet (50 mg total) by mouth 2 (two) times daily. For headache prevention 180 tablet 1  . topiramate (TOPAMAX) 50 MG tablet Take 1 tablet (50 mg total) by mouth 2 (two) times daily. 180 tablet 0   No current facility-administered medications for this visit.     Allergies as of 01/11/2019  . (No Known Allergies)    Family History  Problem Relation Age of Onset  . Heart disease Maternal Grandmother   . Heart disease Maternal Grandfather   . Heart disease Paternal Grandmother   . Heart disease Paternal Grandfather   . Cancer Cousin   . Hypertension Mother   . Colon cancer Maternal Uncle   . Rectal cancer Neg Hx   . Stomach cancer Neg Hx     Social History   Socioeconomic History  . Marital status: Single    Spouse name: Not on file  . Number of children: Not on file  . Years of education: Not on file  . Highest education level: Not on file  Occupational History  . Not on file  Social Needs  . Financial resource strain: Not on file  . Food insecurity    Worry: Not on file    Inability: Not on file  . Transportation needs    Medical: Not on file    Non-medical: Not on file  Tobacco Use  . Smoking status: Current Every Day Smoker    Packs/day: 0.20    Years: 25.00    Pack years: 5.00    Types: Cigarettes    Start date: 05/26/1978  . Smokeless tobacco: Never Used  . Tobacco comment: slowing down  Substance and Sexual Activity  . Alcohol use: Yes    Alcohol/week: 3.0 standard drinks    Types: 2 Cans of beer, 1 Standard drinks or equivalent per week    Comment: intermittent beer every few days; never ore than 2 beers per sitting.  . Drug use: No    Frequency: 1.0 times per week  . Sexual activity: Not Currently    Birth control/protection: None  Lifestyle  . Physical activity    Days per week: Not on file     Minutes per session: Not on file  . Stress: Not on file  Relationships  . Social Herbalist on phone: Not on file    Gets together: Not on file    Attends religious service: Not on file    Active member of club or organization: Not on file    Attends meetings of clubs or organizations: Not on file    Relationship status: Not on file  Other Topics Concern  .  Not on file  Social History Narrative  . Not on file    Review of Systems: Complete ROS negative except as per HPI.   Physical Exam: There were no vitals taken for this visit. General:   Alert and oriented. Pleasant and cooperative. Well-nourished and well-developed.  Head:  Normocephalic and atraumatic. Eyes:  Without icterus, sclera clear and conjunctiva pink.  Ears:  Normal auditory acuity. Mouth:  No deformity or lesions, oral mucosa pink.  Throat/Neck:  Supple, without mass or thyromegaly. Cardiovascular:  S1, S2 present without murmurs appreciated. Normal pulses noted. Extremities without clubbing or edema. Respiratory:  Clear to auscultation bilaterally. No wheezes, rales, or rhonchi. No distress.  Gastrointestinal:  +BS, soft, non-tender and non-distended. No HSM noted. No guarding or rebound. No masses appreciated.  Rectal:  Deferred  Musculoskalatal:  Symmetrical without gross deformities. Normal posture. Skin:  Intact without significant lesions or rashes. Neurologic:  Alert and oriented x4;  grossly normal neurologically. Psych:  Alert and cooperative. Normal mood and affect. Heme/Lymph/Immune: No significant cervical adenopathy. No excessive bruising noted.    01/11/2019 7:53 AM   Disclaimer: This note was dictated with voice recognition software. Similar sounding words can inadvertently be transcribed and may not be corrected upon review.

## 2019-02-03 ENCOUNTER — Ambulatory Visit: Payer: Medicaid Other | Admitting: Nurse Practitioner

## 2019-02-15 ENCOUNTER — Other Ambulatory Visit: Payer: Self-pay | Admitting: Family Medicine

## 2019-02-17 ENCOUNTER — Other Ambulatory Visit: Payer: Self-pay

## 2019-02-17 ENCOUNTER — Encounter: Payer: Self-pay | Admitting: Nurse Practitioner

## 2019-02-17 ENCOUNTER — Ambulatory Visit (INDEPENDENT_AMBULATORY_CARE_PROVIDER_SITE_OTHER): Payer: Medicaid Other | Admitting: Nurse Practitioner

## 2019-02-17 VITALS — BP 134/88 | HR 63 | Temp 98.0°F | Ht 67.0 in | Wt 210.4 lb

## 2019-02-17 DIAGNOSIS — F1011 Alcohol abuse, in remission: Secondary | ICD-10-CM | POA: Diagnosis not present

## 2019-02-17 DIAGNOSIS — K7030 Alcoholic cirrhosis of liver without ascites: Secondary | ICD-10-CM | POA: Diagnosis not present

## 2019-02-17 NOTE — Assessment & Plan Note (Signed)
Chronic cirrhosis currently due for labs and imaging.  Generally asymptomatic from a hepatic standpoint.  We will proceed with routine liver care.  He has cut back on his drinking I have recommended he continue to cut back and eventually quit drinking.  Follow-up in 6 months.

## 2019-02-17 NOTE — Patient Instructions (Signed)
Your health issues we discussed today were:   Cirrhosis: 1. Have your labs completed when you are able to do 2. We will help schedule your ultrasound of your liver 3. Continue to reduce the amount you drink to the point that you are no longer drinking alcohol 4. Call us if you have any worsening or severe symptoms  Overall I recommend:  1. As we discussed, try taking your fish oil around lunchtime 2. Continue your other current medications 3. Return for follow-up in 6 months 4. Call us if you have any questions or concerns.   Because of recent events of COVID-19 ("Coronavirus"), follow CDC recommendations:  1. Wash your hand frequently 2. Avoid touching your face 3. Stay away from people who are sick 4. If you have symptoms such as fever, cough, shortness of breath then call your healthcare provider for further guidance 5. If you are sick, STAY AT HOME unless otherwise directed by your healthcare provider. 6. Follow directions from state and national officials regarding staying safe   At [facility] we value your feedback. You may receive a survey about your visit today. Please share your experience as we strive to create trusting relationships with our patients to provide genuine, compassionate, quality care.  We appreciate your understanding and patience as we review any laboratory studies, imaging, and other diagnostic tests that are ordered as we care for you. Our office policy is 5 business days for review of these results, and any emergent or urgent results are addressed in a timely manner for your best interest. If you do not hear from our office in 1 week, please contact us.   We also encourage the use of MyChart, which contains your medical information for your review as well. If you are not enrolled in this feature, an access code is on this after visit summary for your convenience. Thank you for allowing [place] to be involved in your care.  It was great to see  you today!  I hope you have a great Fall!!

## 2019-02-17 NOTE — Progress Notes (Signed)
Referring Provider: [name], MD Primary Care Physician:  [name], MD Primary GI:  Dr. Oneida Alar  Chief Complaint  Patient presents with  . Cirrhosis    PT REPORTS FULL FEELING IN HIS STOMACH AFTER MEDICATION IS TAKEN . pt isn't sure what medication could be causing this full feeling.     HPI:   Philip [name] is a 63 y.o. male who presents for follow-up on cirrhosis.  The patient was last seen in our office [id number] for alcoholic cirrhosis without ascites, H. pylori infection.  At that time noted good appetite, still drinks alcohol once a day or once every other day.  No abdominal or lower extremity swelling.  No other GI complaints.  History of H. pylori infection.  Recommended H. pylori breath testing to confirm eradication, continue weight loss efforts, avoid all alcohol.  Continue Nexium daily, right upper quadrant ultrasound every 6 months.  Recommended office labs 1 week prior to next visit.  Labs ordered for 12/20/2018 were not completed.  H. pylori breath testing was not completed.  Patient was a no-show for his follow-up office visit on 09/21/2018.  The patient had recall for ultrasound in July 2020.  This is not appear to have been completed.  Today he states he's doing ok overall. Has had some issues with a medication which causes a sensation of feeling full, like he has already eaten; bloated. Not sure what medications cause that. Denies abdominal pain, N/V, fever, chills, unintentional weight loss. Denies yellowing of skin/eyes, darkened urine, acute episodic confusion, tremors/shakes, generalized pruritis. Drinks ETOH about 1 beer on average 2-3 times a week. Denies URI or flu-like symptoms. Denies loss of sense of taste or smell. Denies chest pain, dyspnea, dizziness, lightheadedness, syncope, near syncope. Denies any other upper or lower GI symptoms.  Past Medical History:  Diagnosis Date  . Diabetes mellitus without complication (HCC)    borderline  . Erectile  dysfunction   . GERD (gastroesophageal reflux disease)   . Headache(784.0)    "Headache, I dont think they are migranes"  . Hepatitis C    [place], completed 06/2015 (Dr. Linus Salmons) , AUG 2017 SVR  . History of kidney stones    history of kidney stones  . History of migraine    takes Topamax daily  . Hypertension    takes Hydralazine,Maxzide,Clonidine,and Amlodipine daily  . Hypertension    also takes Metoprolol daily  . Nausea    takes Zofran daily  . Seizures ([place])    off Dilantin daily.  last one was when he had a stroke.  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 Sleep apnea    CPAP- tries to do it everyday.  . Stroke ([place])    .  Short term memory loss.; left sided weakness  . Tobacco user     Past Surgical History:  Procedure Laterality Date  . BIOPSY  03/10/2017   Procedure: BIOPSY;  Surgeon: Danie Binder, MD;  [facility];  Service: Endoscopy;;  gastric biopsy   . CHOLECYSTECTOMY N/A 03/09/2013   Procedure: LAPAROSCOPIC CHOLECYSTECTOMY WITH INTRAOPERATIVE CHOLANGIOGRAM;  Surgeon: Imogene Burn. Georgette Dover, MD;  Location: [facility];  Service: General;  Laterality: N/A;  . COLONOSCOPY  06/2014   Dr. Olevia Perches: Three sessile polyps were found in the rectum x2 and descending. tubular adenomas. colonoscopy in 06/2019  . ESOPHAGOGASTRODUODENOSCOPY (EGD) WITH PROPOFOL N/A 08/21/2015   Dr. Oneida Alar: normal esophagus, non-bleeding gastric ulcers/gastritis (H.pylori)   .  ESOPHAGOGASTRODUODENOSCOPY (EGD) WITH PROPOFOL N/A 03/10/2017   Procedure: ESOPHAGOGASTRODUODENOSCOPY (EGD) WITH PROPOFOL;  Surgeon: Danie Binder, MD;  [facility];  Service: Endoscopy;  Laterality: N/A;  1:00pm-rescheduled to 10\16 @ [id number]   . UMBILICAL HERNIA REPAIR N/A 03/09/2013   Procedure: HERNIA REPAIR UMBILICAL ADULT;  Surgeon: Imogene Burn. Georgette Dover, MD;  Location: [facility];  Service: General;  Laterality: N/A;    Current Outpatient Medications  Medication Sig Dispense Refill  . amLODipine  (NORVASC) 10 MG tablet Take 1 tablet by mouth once daily 90 tablet 1  . aspirin EC 81 MG tablet Take 81 mg by mouth daily.    . fish oil-omega-3 fatty acids 1000 MG capsule Take 2 g by mouth every other day.     . gabapentin (NEURONTIN) 300 MG capsule Take 1 capsule by mouth at bedtime 90 capsule 0  . hydrALAZINE (APRESOLINE) 25 MG tablet TAKE 1 TABLET BY MOUTH THREE TIMES DAILY 90 tablet 0  . metoprolol succinate (TOPROL-XL) 50 MG 24 hr tablet TAKE 1 TABLET BY MOUTH ONCE DAILY. TAKE WITH OR IMMEDIATELY FOLLOWING A MEAL 90 tablet 1  . pantoprazole (PROTONIX) 40 MG tablet 1 po 30 mins prior to first meal 30 tablet 11  . sildenafil (REVATIO) 20 MG tablet Take 2 to 5 tablets once daily as needed for erectile dysfunction 20 tablet 3  . topiramate (TOPAMAX) 50 MG tablet Take 1 tablet (50 mg total) by mouth 2 (two) times daily. For headache prevention 180 tablet 1  . topiramate (TOPAMAX) 50 MG tablet Take 1 tablet (50 mg total) by mouth 2 (two) times daily. 180 tablet 0   No current facility-administered medications for this visit.     Allergies as of 02/17/2019  . (No Known Allergies)    Family History  Problem Relation Age of Onset  . Heart disease Maternal Grandmother   . Heart disease Maternal Grandfather   . Heart disease Paternal Grandmother   . Heart disease Paternal Grandfather   . Cancer Cousin   . Hypertension Mother   . Colon cancer Maternal Uncle   . Rectal cancer Neg Hx   . Stomach cancer Neg Hx     Social History   Socioeconomic History  . Marital status: Single    Spouse name: Not on file  . Number of children: Not on file  . Years of education: Not on file  . Highest education level: Not on file  Occupational History  . Not on file  Social Needs  . Financial resource strain: Not on file  . Food insecurity    Worry: Not on file    Inability: Not on file  . Transportation needs    Medical: Not on file    Non-medical: Not on file  Tobacco Use  . Smoking  status: Current Every Day Smoker    Packs/day: 0.20    Years: 25.00    Pack years: 5.00    Types: Cigarettes    Start date: 05/26/1978  . Smokeless tobacco: Never Used  . Tobacco comment: slowing down  Substance and Sexual Activity  . Alcohol use: Yes    Alcohol/week: 3.0 standard drinks    Types: 2 Cans of beer, 1 Standard drinks or equivalent per week    Comment: intermittent beer every few days; never more than 2 beers per sitting.  . Drug use: No    Frequency: 1.0 times per week  . Sexual activity: Not Currently    Birth control/protection: None  Lifestyle  .  Physical activity    Days per week: Not on file    Minutes per session: Not on file  . Stress: Not on file  Relationships  . Social Herbalist on phone: Not on file    Gets together: Not on file    Attends religious service: Not on file    Active member of club or organization: Not on file    Attends meetings of clubs or organizations: Not on file    Relationship status: Not on file  Other Topics Concern  . Not on file  Social History Narrative  . Not on file    Review of Systems: Complete ROS negative except as per HPI.   Physical Exam: BP 134/88   Pulse 63   Temp 98 F (36.7 C) (Oral)   Ht 5\' 7"  (1.702 m)   Wt 210 lb 6.4 oz (95.4 kg)   BMI 32.95 kg/m  General:   Alert and oriented. Pleasant and cooperative. Well-nourished and well-developed.  Head:  Normocephalic and atraumatic. Eyes:  Without icterus, sclera clear and conjunctiva pink.  Ears:  Normal auditory acuity. Mouth:  No deformity or lesions, oral mucosa pink.  Throat/Neck:  Supple, without mass or thyromegaly. Cardiovascular:  S1, S2 present without murmurs appreciated. Normal pulses noted. Extremities without clubbing or edema. Respiratory:  Clear to auscultation bilaterally. No wheezes, rales, or rhonchi. No distress.  Gastrointestinal:  +BS, soft, non-tender and non-distended. No HSM noted. No guarding or rebound. No masses  appreciated.  Rectal:  Deferred  Musculoskalatal:  Symmetrical without gross deformities. Normal posture. Skin:  Intact without significant lesions or rashes. Neurologic:  Alert and oriented x4;  grossly normal neurologically. Psych:  Alert and cooperative. Normal mood and affect. Heme/Lymph/Immune: No significant cervical adenopathy. No excessive bruising noted.    02/17/2019 3:09 PM   Disclaimer: This note was dictated with voice recognition software. Similar sounding words can inadvertently be transcribed and may not be corrected upon review.

## 2019-02-17 NOTE — Assessment & Plan Note (Signed)
Previous alcohol abuse and he has cut back significantly on his drinking.  Currently drinks 1 beer about 2-3 times a week.  I recommended he continue to reduce his drinking and eventually eliminate alcohol.  He verbalizes understanding.  Return for follow-up in 6 months.

## 2019-02-18 ENCOUNTER — Telehealth: Payer: Self-pay

## 2019-02-18 ENCOUNTER — Encounter: Payer: Self-pay | Admitting: Gastroenterology

## 2019-02-18 NOTE — Telephone Encounter (Signed)
PA for [place] approved via [employer]. PA# [id number], valid 02/18/19-08/17/19.

## 2019-02-18 NOTE — Telephone Encounter (Signed)
[place] abd RUQ scheduled for 02/25/19 at 9:30am, arrive at 9:15am. NPO after midnight before test. Called and informed pt of appt. Letter mailed.

## 2019-02-25 ENCOUNTER — Other Ambulatory Visit: Payer: Self-pay | Admitting: Family Medicine

## 2019-02-25 ENCOUNTER — Ambulatory Visit (HOSPITAL_COMMUNITY): Payer: Medicaid Other

## 2019-03-04 ENCOUNTER — Ambulatory Visit (HOSPITAL_COMMUNITY)
Admission: RE | Admit: 2019-03-04 | Discharge: 2019-03-04 | Disposition: A | Discharge status: Home / Self Care | Payer: Medicaid Other | Source: Ambulatory Visit | Attending: Nurse Practitioner | Admitting: Nurse Practitioner

## 2019-03-04 ENCOUNTER — Other Ambulatory Visit: Payer: Self-pay

## 2019-03-04 DIAGNOSIS — K7030 Alcoholic cirrhosis of liver without ascites: Secondary | ICD-10-CM | POA: Diagnosis not present

## 2019-03-04 DIAGNOSIS — F1011 Alcohol abuse, in remission: Secondary | ICD-10-CM | POA: Insufficient documentation

## 2019-03-04 DIAGNOSIS — K7460 Unspecified cirrhosis of liver: Secondary | ICD-10-CM | POA: Diagnosis not present

## 2019-03-22 ENCOUNTER — Other Ambulatory Visit: Payer: Self-pay | Admitting: Family Medicine

## 2019-05-30 ENCOUNTER — Other Ambulatory Visit: Payer: Self-pay | Admitting: Family Medicine

## 2019-05-31 MED ORDER — HYDRALAZINE HCL 25 MG PO TABS: ORAL_TABLET | ORAL | 0 refills | Status: DC

## 2019-05-31 NOTE — Telephone Encounter (Signed)
Pt scheduled with Dr Livia Snellen 1/20 at 4:10 so 30 day supply sent in and pt aware he must come to appt for any further refills.

## 2019-05-31 NOTE — Addendum Note (Signed)
Addended by: Milas Hock on: 05/31/2019 12:01 PM   Modules accepted: Orders

## 2019-05-31 NOTE — Telephone Encounter (Signed)
Stacks. NTBS 30 days given 03/23/19 LOV in April 2020

## 2019-06-15 ENCOUNTER — Ambulatory Visit: Payer: Medicaid Other | Admitting: Family Medicine

## 2019-06-16 ENCOUNTER — Encounter: Payer: Self-pay | Admitting: Family Medicine

## 2019-06-16 ENCOUNTER — Other Ambulatory Visit: Payer: Self-pay

## 2019-06-16 ENCOUNTER — Ambulatory Visit (INDEPENDENT_AMBULATORY_CARE_PROVIDER_SITE_OTHER): Payer: Medicaid Other | Admitting: Family Medicine

## 2019-06-16 VITALS — BP 139/86 | HR 72 | Temp 97.5°F | Ht 67.0 in | Wt 210.6 lb

## 2019-06-16 DIAGNOSIS — R7303 Prediabetes: Secondary | ICD-10-CM | POA: Diagnosis not present

## 2019-06-16 DIAGNOSIS — K7460 Unspecified cirrhosis of liver: Secondary | ICD-10-CM | POA: Diagnosis not present

## 2019-06-16 DIAGNOSIS — N529 Male erectile dysfunction, unspecified: Secondary | ICD-10-CM

## 2019-06-16 DIAGNOSIS — E785 Hyperlipidemia, unspecified: Secondary | ICD-10-CM

## 2019-06-16 DIAGNOSIS — R519 Headache, unspecified: Secondary | ICD-10-CM

## 2019-06-16 DIAGNOSIS — Z125 Encounter for screening for malignant neoplasm of prostate: Secondary | ICD-10-CM | POA: Diagnosis not present

## 2019-06-16 DIAGNOSIS — I10 Essential (primary) hypertension: Secondary | ICD-10-CM | POA: Diagnosis not present

## 2019-06-16 DIAGNOSIS — R140 Abdominal distension (gaseous): Secondary | ICD-10-CM | POA: Diagnosis not present

## 2019-06-16 DIAGNOSIS — G8929 Other chronic pain: Secondary | ICD-10-CM | POA: Diagnosis not present

## 2019-06-16 DIAGNOSIS — E119 Type 2 diabetes mellitus without complications: Secondary | ICD-10-CM | POA: Diagnosis not present

## 2019-06-16 LAB — BAYER DCA HB A1C WAIVED: HB A1C (BAYER DCA - WAIVED): 5.8 % (ref <7.0)

## 2019-06-16 MED ORDER — ATORVASTATIN CALCIUM 40 MG PO TABS: 40.0000 mg | ORAL_TABLET | Freq: Every day | ORAL | 3 refills | Status: DC

## 2019-06-16 MED ORDER — TOPIRAMATE 50 MG PO TABS: 50.0000 mg | ORAL_TABLET | Freq: Two times a day (BID) | ORAL | 1 refills | Status: DC

## 2019-06-16 MED ORDER — METOPROLOL SUCCINATE ER 50 MG PO TB24: ORAL_TABLET | ORAL | 1 refills | Status: DC

## 2019-06-16 MED ORDER — ESOMEPRAZOLE MAGNESIUM 40 MG PO CPDR: 40.0000 mg | DELAYED_RELEASE_CAPSULE | Freq: Every day | ORAL | 5 refills | Status: DC

## 2019-06-16 MED ORDER — SILDENAFIL CITRATE 20 MG PO TABS: ORAL_TABLET | ORAL | 3 refills | Status: DC

## 2019-06-16 MED ORDER — AMLODIPINE BESYLATE 10 MG PO TABS: 10.0000 mg | ORAL_TABLET | Freq: Every day | ORAL | 1 refills | Status: DC

## 2019-06-16 MED ORDER — HYDRALAZINE HCL 25 MG PO TABS: ORAL_TABLET | ORAL | 1 refills | Status: DC

## 2019-06-16 MED ORDER — GABAPENTIN 300 MG PO CAPS: 300.0000 mg | ORAL_CAPSULE | Freq: Every day | ORAL | 1 refills | Status: DC

## 2019-06-16 NOTE — Progress Notes (Signed)
Subjective:  Patient ID: Philip [name], male    DOB: 1955/08/04  Age: 64 y.o. MRN: [id number]  CC: Follow-up (Chronic)   HPI Philip [name] presents for  follow-up of hypertension. Patient has no history of headache chest pain or shortness of breath or recent cough. Patient also denies symptoms of TIA such as focal numbness or weakness. Patient denies side effects from medication. States taking it regularly.  We discussed history of diabetes.  He had that diagnosis for which she was followed by Dr. Wendi [name].  Apparently he had elevated blood sugars during hospitalization a couple of years ago.  He was trained on a low-carb diet at that time.  He follows it carefully still.  He does not take any diabetes medications.  His A1c the has stayed at 5.7 or below since that time.  He is not sure why he is considered a diabetic.  Patient also says that the PPI he takes for his reflux causes swelling in the stomach and makes him feel bloated.  It does seem to help with heartburn.  Pati abdominal bloating he does not have peripheral edema other than the ent has also been diagnosed with cirrhosis.   History Adonte has a past medical history of Diabetes mellitus without complication ([name]), Erectile dysfunction, GERD (gastroesophageal reflux disease), Headache(784.0), Hepatitis C, History of kidney stones, History of migraine, Hypertension, Hypertension, Nausea, Seizures ([name]), Short-term memory loss, Sleep apnea, Stroke ([place]), and Tobacco user.   He has a past surgical history that includes Umbilical hernia repair (N/A, 03/09/2013); Colonoscopy (06/2014); Esophagogastroduodenoscopy (egd) with propofol (N/A, 08/21/2015); Cholecystectomy (N/A, 03/09/2013); Esophagogastroduodenoscopy (egd) with propofol (N/A, 03/10/2017); and biopsy (03/10/2017).   His family history includes Cancer in his cousin; Colon cancer in his maternal uncle; Heart disease in his maternal grandfather, maternal grandmother, paternal  grandfather, and paternal grandmother; Hypertension in his mother.He reports that he has been smoking cigarettes. He started smoking about 41 years ago. He has a 5.00 pack-year smoking history. He has never used smokeless tobacco. He reports current alcohol use of about 3.0 standard drinks of alcohol per week. He reports that he does not use drugs.  Current Outpatient Medications on File Prior to Visit  Medication Sig Dispense Refill  . aspirin EC 81 MG tablet Take 81 mg by mouth daily.    . pantoprazole (PROTONIX) 40 MG tablet 1 po 30 mins prior to first meal 30 tablet 11   No current facility-administered medications on file prior to visit.    ROS Review of Systems  Constitutional: Negative.   HENT: Negative.   Eyes: Negative for visual disturbance.  Respiratory: Negative for cough and shortness of breath.   Cardiovascular: Negative for chest pain and leg swelling.  Gastrointestinal: Positive for abdominal distention. Negative for abdominal pain, blood in stool, diarrhea, nausea and vomiting.  Genitourinary: Negative for difficulty urinating.  Musculoskeletal: Negative for arthralgias and myalgias.  Skin: Negative for rash.  Neurological: Positive for headaches (Generally relieved by over-the-counter medication as long as he maintains his topiramate to mitigate headache frequency and intensity.Marland Kitchen).  Psychiatric/Behavioral: Negative for sleep disturbance.    Objective:  BP 139/86   Pulse 72   Temp (!) 97.5 F (36.4 C) (Temporal)   Ht '5\' 7"'$  (1.702 m)   Wt 210 lb 9.6 oz (95.5 kg)   BMI 32.98 kg/m   BP Readings from Last 3 Encounters:  06/16/19 139/86  02/17/19 134/88  06/24/18 (!) 144/92    Wt Readings from Last 3 Encounters:  06/16/19 210 lb 9.6 oz (95.5 kg)  02/17/19 210 lb 6.4 oz (95.4 kg)  06/24/18 200 lb 9.6 oz (91 kg)     Physical Exam Constitutional:      General: He is not in acute distress.    Appearance: He is well-developed.  HENT:     Head: Normocephalic  and atraumatic.     Right Ear: External ear normal.     Left Ear: External ear normal.     Nose: Nose normal.  Eyes:     Conjunctiva/sclera: Conjunctivae normal.     Pupils: Pupils are equal, round, and reactive to light.  Cardiovascular:     Rate and Rhythm: Normal rate and regular rhythm.     Heart sounds: Normal heart sounds. No murmur.  Pulmonary:     Effort: Pulmonary effort is normal. No respiratory distress.     Breath sounds: Normal breath sounds. No wheezing or rales.  Abdominal:     General: There is distension (And tympanic but painless).     Palpations: Abdomen is soft.     Tenderness: There is no abdominal tenderness.  Musculoskeletal:        General: Normal range of motion.     Cervical back: Normal range of motion and neck supple.  Skin:    General: Skin is warm and dry.     Coloration: Skin is not jaundiced.     Findings: No rash.  Neurological:     Mental Status: He is alert and oriented to person, place, and time.     Deep Tendon Reflexes: Reflexes are normal and symmetric.  Psychiatric:        Behavior: Behavior normal.        Thought Content: Thought content normal.        Judgment: Judgment normal.       Assessment & Plan:   Tung was seen today for follow-up.  Diagnoses and all orders for this visit:  Prediabetes -     CBC with Differential/Platelet -     CMP14+EGFR -     Bayer DCA Hb A1c Waived  Chronic nonintractable headache, unspecified headache type -     CBC with Differential/Platelet -     CMP14+EGFR -     topiramate (TOPAMAX) 50 MG tablet; Take 1 tablet (50 mg total) by mouth 2 (two) times daily.  Cirrhosis of liver without ascites, unspecified hepatic cirrhosis type (HCC) -     CBC with Differential/Platelet -     CMP14+EGFR -     Ammonia -     Amylase -     Lipase  Hyperlipidemia, unspecified hyperlipidemia type -     Lipid panel  Essential hypertension -     metoprolol succinate (TOPROL-XL) 50 MG 24 hr tablet; TAKE 1  TABLET BY MOUTH ONCE DAILY. TAKE WITH OR IMMEDIATELY FOLLOWING A MEAL -     hydrALAZINE (APRESOLINE) 25 MG tablet; TAKE 1 TABLET BY MOUTH THREE TIMES DAILY (Needs to be seen before next refill) -     amLODipine (NORVASC) 10 MG tablet; Take 1 tablet (10 mg total) by mouth daily.  Erectile dysfunction, unspecified erectile dysfunction type -     sildenafil (REVATIO) 20 MG tablet; Take 2 to 5 tablets once daily as needed for erectile dysfunction  Screening for prostate cancer -     PSA Total (Reflex To Free)  Abdominal bloating -     Ammonia -     Amylase -     Lipase  Other  orders -     gabapentin (NEURONTIN) 300 MG capsule; Take 1 capsule (300 mg total) by mouth at bedtime. -     atorvastatin (LIPITOR) 40 MG tablet; Take 1 tablet (40 mg total) by mouth daily. For cholesterol -     esomeprazole (NEXIUM) 40 MG capsule; Take 1 capsule (40 mg total) by mouth daily. -     Lipid panel -     PSA Total (Reflex To Free) -     Amylase -     Lipase -     Specimen status report   Allergies as of 06/16/2019   No Known Allergies     Medication List       Accurate as of June 16, 2019 11:59 PM. If you have any questions, ask your nurse or doctor.        STOP taking these medications   fish oil-omega-3 fatty acids 1000 MG capsule Stopped by: [name], MD     TAKE these medications   amLODipine 10 MG tablet Commonly known as: NORVASC Take 1 tablet (10 mg total) by mouth daily.   aspirin EC 81 MG tablet Take 81 mg by mouth daily.   atorvastatin 40 MG tablet Commonly known as: LIPITOR Take 1 tablet (40 mg total) by mouth daily. For cholesterol Started by: [name], MD   esomeprazole 40 MG capsule Commonly known as: NexIUM Take 1 capsule (40 mg total) by mouth daily. Started by: [name], MD   gabapentin 300 MG capsule Commonly known as: NEURONTIN Take 1 capsule (300 mg total) by mouth at bedtime.   hydrALAZINE 25 MG tablet Commonly known as:  APRESOLINE TAKE 1 TABLET BY MOUTH THREE TIMES DAILY (Needs to be seen before next refill)   metoprolol succinate 50 MG 24 hr tablet Commonly known as: TOPROL-XL TAKE 1 TABLET BY MOUTH ONCE DAILY. TAKE WITH OR IMMEDIATELY FOLLOWING A MEAL   pantoprazole 40 MG tablet Commonly known as: Protonix 1 po 30 mins prior to first meal   sildenafil 20 MG tablet Commonly known as: Revatio Take 2 to 5 tablets once daily as needed for erectile dysfunction   topiramate 50 MG tablet Commonly known as: Topamax Take 1 tablet (50 mg total) by mouth 2 (two) times daily. What changed: Another medication with the same name was removed. Continue taking this medication, and follow the directions you see here. Changed by: [name], MD       Meds ordered this encounter  Medications  . topiramate (TOPAMAX) 50 MG tablet    Sig: Take 1 tablet (50 mg total) by mouth 2 (two) times daily.    Dispense:  180 tablet    Refill:  1  . sildenafil (REVATIO) 20 MG tablet    Sig: Take 2 to 5 tablets once daily as needed for erectile dysfunction    Dispense:  20 tablet    Refill:  3  . metoprolol succinate (TOPROL-XL) 50 MG 24 hr tablet    Sig: TAKE 1 TABLET BY MOUTH ONCE DAILY. TAKE WITH OR IMMEDIATELY FOLLOWING A MEAL    Dispense:  90 tablet    Refill:  1  . hydrALAZINE (APRESOLINE) 25 MG tablet    Sig: TAKE 1 TABLET BY MOUTH THREE TIMES DAILY (Needs to be seen before next refill)    Dispense:  90 tablet    Refill:  1  . gabapentin (NEURONTIN) 300 MG capsule    Sig: Take 1 capsule (300 mg total) by mouth at bedtime.  Dispense:  90 capsule    Refill:  1  . amLODipine (NORVASC) 10 MG tablet    Sig: Take 1 tablet (10 mg total) by mouth daily.    Dispense:  90 tablet    Refill:  1  . atorvastatin (LIPITOR) 40 MG tablet    Sig: Take 1 tablet (40 mg total) by mouth daily. For cholesterol    Dispense:  90 tablet    Refill:  3  . esomeprazole (NEXIUM) 40 MG capsule    Sig: Take 1 capsule (40 mg total)  by mouth daily.    Dispense:  30 capsule    Refill:  5    Today the patient had an A1c of 5.8.  This is the highest I could find in his chart.  This indicates impending prediabetes.  It may be that during the hospitalization previously that he was taking using steroids that raise his sugar or was having some hepatic dysfunction from his cirrhosis that led to some elevated glucose.  However at this time it appears that prediabetes is the most appropriate.diag and that diet is the most appropriate treatment.  Due to the bloating and the history of liver disease his blood work is pending including liver functions pancreatic functions and will obtain ammonia soon as possible.  Unfortunately that could not be obtained today.  We will have him back another day to try to get that information.  The bloating with tympany appears to be more gas than fluid at this time.  We will give him a change of PPI to see if that reduces the gas production and monitor for resolution prior to considering CT and/or ultrasound.  Patient says he will get some Gas-X.  That seems appropriate at this time as well.  Follow-up: Return in about 6 months (around 12/14/2019) for hypertension, cholesterol, cirrhosis.  [name], M.D.

## 2019-06-17 LAB — CMP14+EGFR
ALT: 28 IU/L (ref 0–44)
AST: 43 IU/L — ABNORMAL HIGH (ref 0–40)
Albumin/Globulin Ratio: 1.3 (ref 1.2–2.2)
Albumin: 4.1 g/dL (ref 3.8–4.8)
Alkaline Phosphatase: 139 IU/L — ABNORMAL HIGH (ref 39–117)
BUN/Creatinine Ratio: 6 — ABNORMAL LOW (ref 10–24)
BUN: 8 mg/dL (ref 8–27)
Bilirubin Total: 0.4 mg/dL (ref 0.0–1.2)
CO2: 22 mmol/L (ref 20–29)
Calcium: 9 mg/dL (ref 8.6–10.2)
Chloride: 105 mmol/L (ref 96–106)
Creatinine, Ser: 1.44 mg/dL — ABNORMAL HIGH (ref 0.76–1.27)
GFR calc Af Amer: 59 mL/min/{1.73_m2} — ABNORMAL LOW (ref >59)
GFR calc non Af Amer: 51 mL/min/{1.73_m2} — ABNORMAL LOW (ref >59)
Globulin, Total: 3.2 g/dL (ref 1.5–4.5)
Glucose: 117 mg/dL — ABNORMAL HIGH (ref 65–99)
Potassium: 4.2 mmol/L (ref 3.5–5.2)
Sodium: 140 mmol/L (ref 134–144)
Total Protein: 7.3 g/dL (ref 6.0–8.5)

## 2019-06-17 LAB — CBC WITH DIFFERENTIAL/PLATELET
Basophils Absolute: 0 10*3/uL (ref 0.0–0.2)
Basos: 1 %
EOS (ABSOLUTE): 0.5 10*3/uL — ABNORMAL HIGH (ref 0.0–0.4)
Eos: 7 %
Hematocrit: 42.7 % (ref 37.5–51.0)
Hemoglobin: 15.3 g/dL (ref 13.0–17.7)
Immature Grans (Abs): 0 10*3/uL (ref 0.0–0.1)
Immature Granulocytes: 1 %
Lymphocytes Absolute: 1.9 10*3/uL (ref 0.7–3.1)
Lymphs: 29 %
MCH: 32.1 pg (ref 26.6–33.0)
MCHC: 35.8 g/dL — ABNORMAL HIGH (ref 31.5–35.7)
MCV: 90 fL (ref 79–97)
Monocytes Absolute: 0.7 10*3/uL (ref 0.1–0.9)
Monocytes: 10 %
Neutrophils Absolute: 3.4 10*3/uL (ref 1.4–7.0)
Neutrophils: 52 %
Platelets: 137 10*3/uL — ABNORMAL LOW (ref 150–450)
RBC: 4.77 x10E6/uL (ref 4.14–5.80)
RDW: 12.8 % (ref 11.6–15.4)
WBC: 6.5 10*3/uL (ref 3.4–10.8)

## 2019-06-17 NOTE — Progress Notes (Signed)
Hello [place],  Your lab result is normal and/or stable.Some minor variations that are not significant are commonly marked abnormal, but do not represent any medical problem for you.  Best regards, Keilany Burnette, M.D.

## 2019-06-18 ENCOUNTER — Encounter: Payer: Self-pay | Admitting: Family Medicine

## 2019-06-18 LAB — LIPID PANEL
Chol/HDL Ratio: 3.3 ratio (ref 0.0–5.0)
Cholesterol, Total: 158 mg/dL (ref 100–199)
HDL: 48 mg/dL (ref >39)
LDL Chol Calc (NIH): 91 mg/dL (ref 0–99)
Triglycerides: 103 mg/dL (ref 0–149)
VLDL Cholesterol Cal: 19 mg/dL (ref 5–40)

## 2019-06-18 LAB — PSA TOTAL (REFLEX TO FREE): Prostate Specific Ag, Serum: 2.5 ng/mL (ref 0.0–4.0)

## 2019-06-18 LAB — LIPASE: Lipase: 36 U/L (ref 13–78)

## 2019-06-18 LAB — SPECIMEN STATUS REPORT

## 2019-06-18 LAB — AMYLASE: Amylase: 42 U/L (ref 31–110)

## 2019-07-09 IMAGING — US ULTRASOUND ABDOMEN LIMITED
1 series · 14 of 25 positions shown · non-contrast
Comparison: Right upper quadrant ultrasound dated October 14, 2017.

CLINICAL DATA: Cirrhosis.

EXAM:
ULTRASOUND ABDOMEN LIMITED RIGHT UPPER QUADRANT

[Series 1: ultrasound abdomen limited · 0.22mm/px · 14 of 28 slices shown]
[im 1/28]
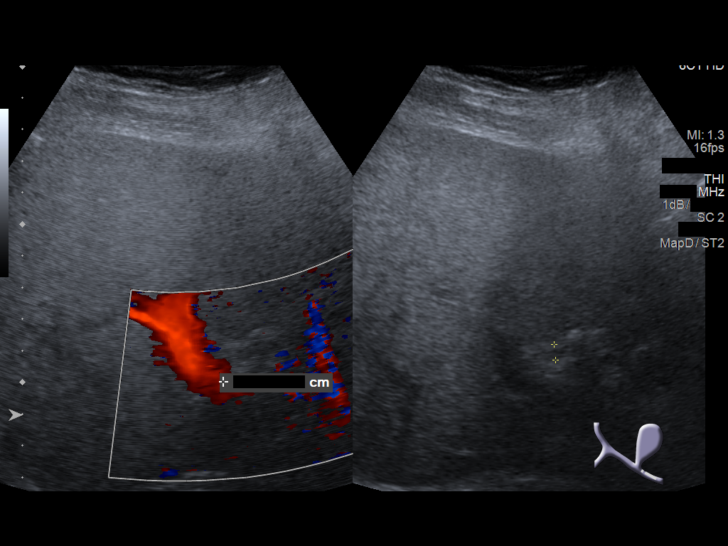
[im 3/28]
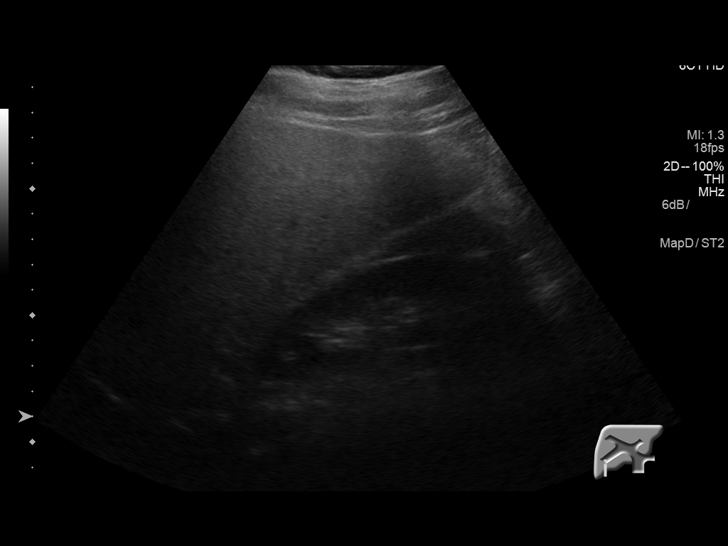
[im 5/28]
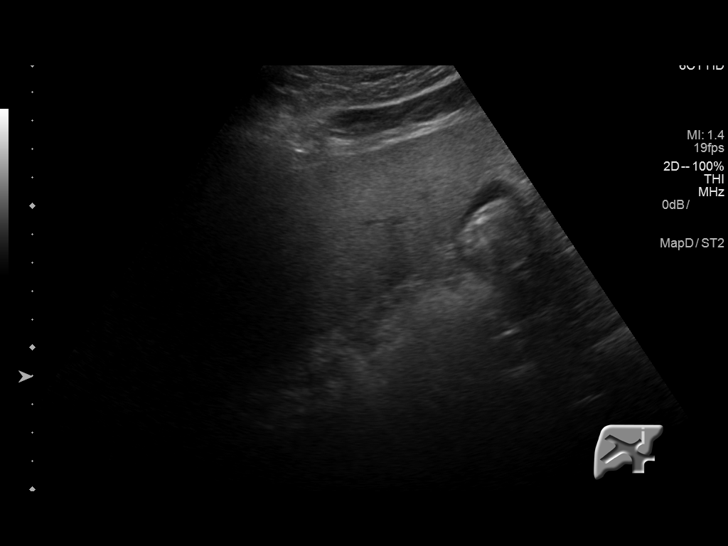
[im 7/28]
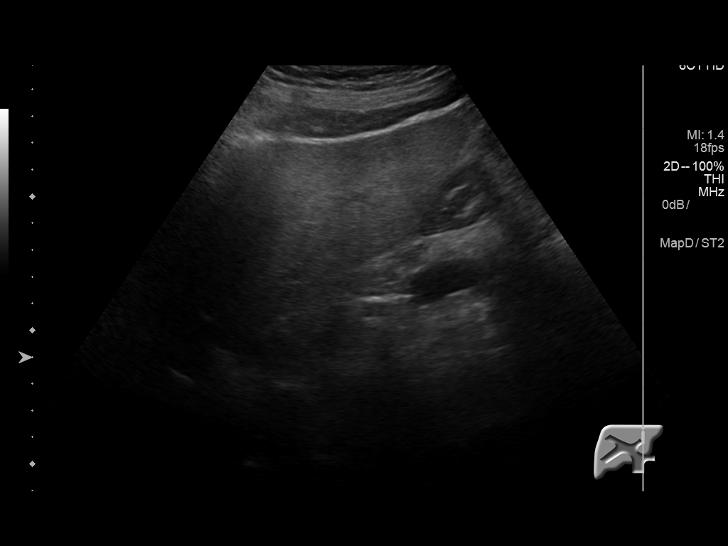
[im 10/28]
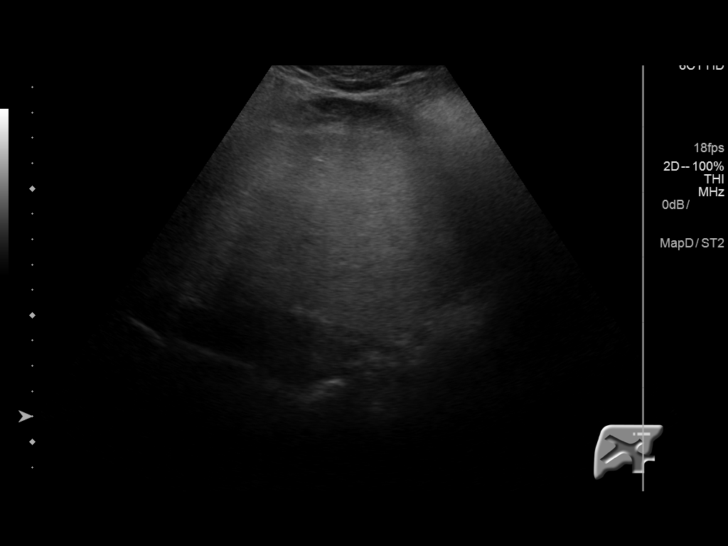
[im 11/28]
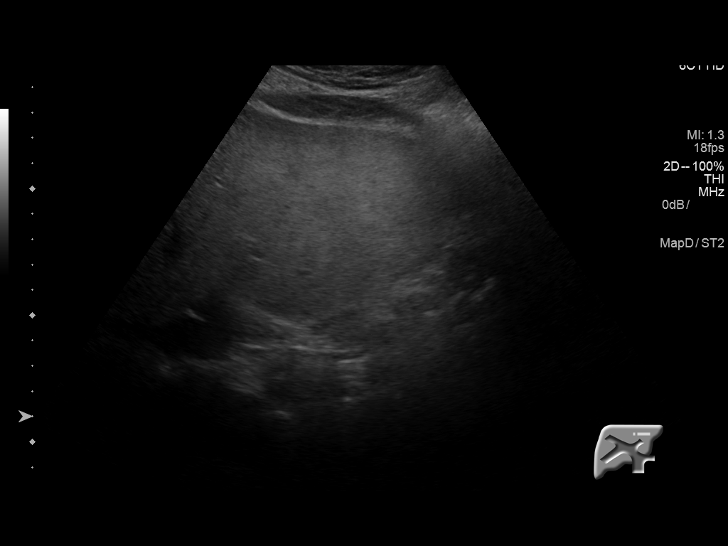
[im 13/28]
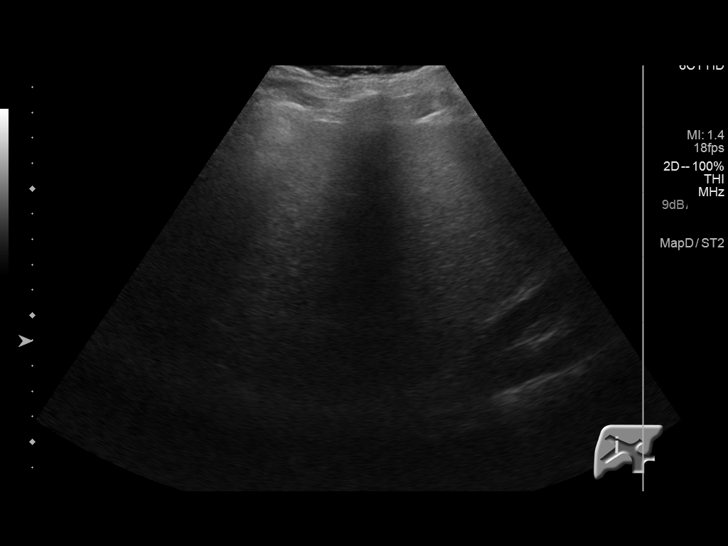
[im 15/28]
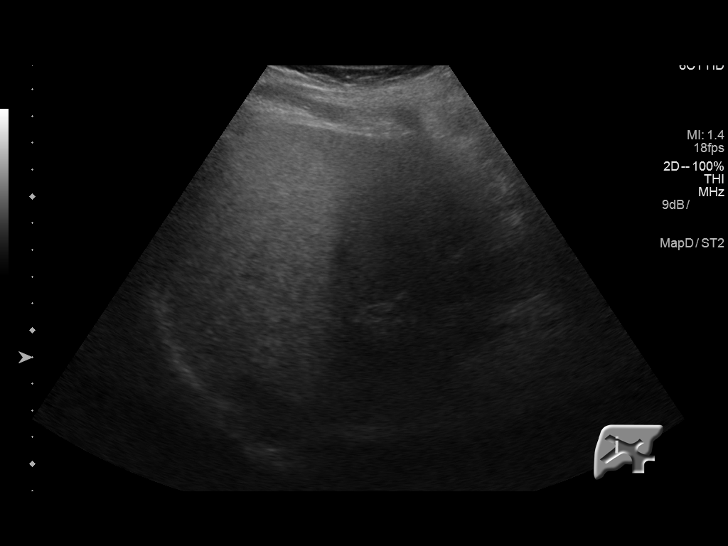
[im 17/28]
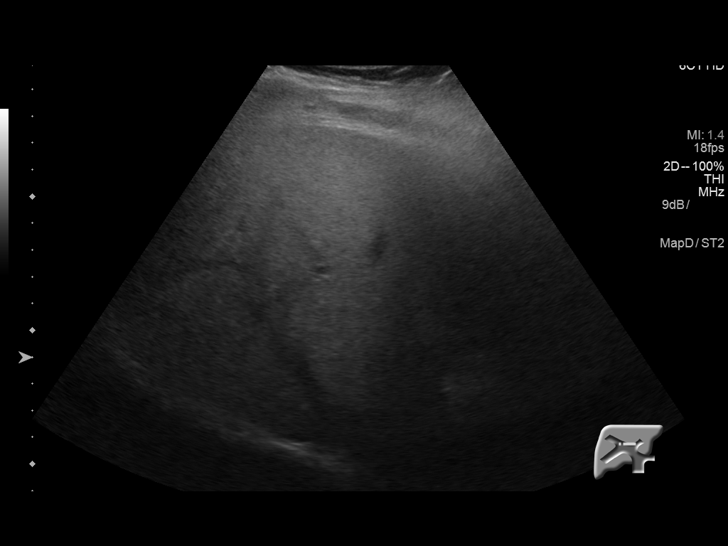
[im 19/28]
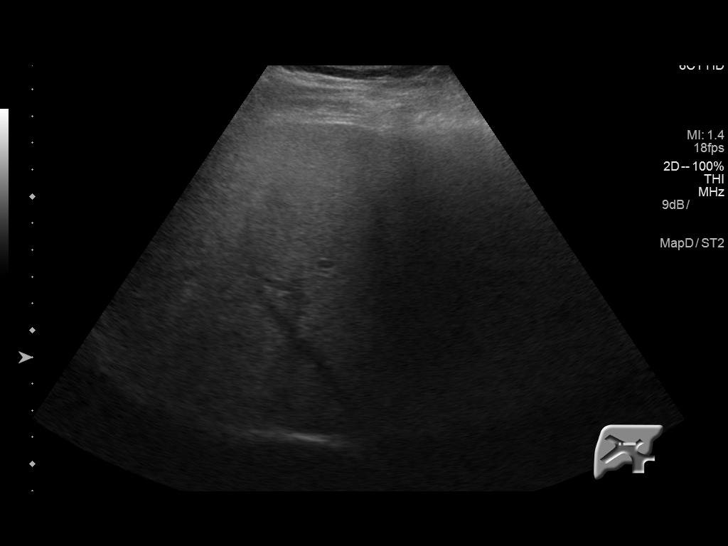
[im 21/28]
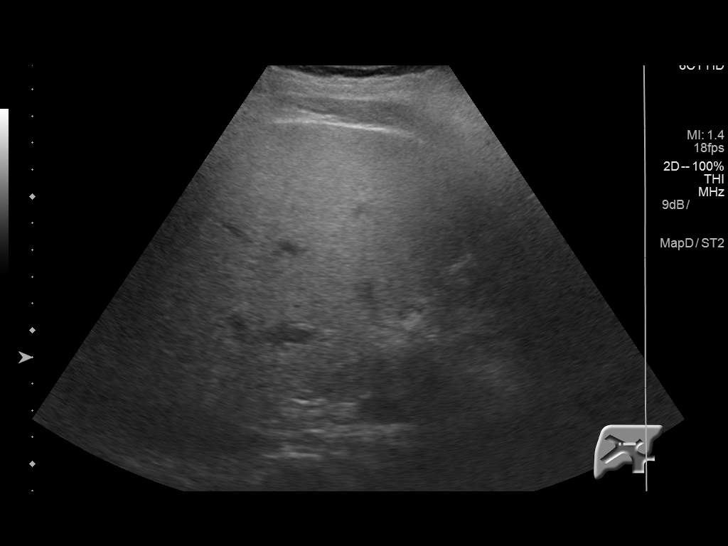
[im 23/28]
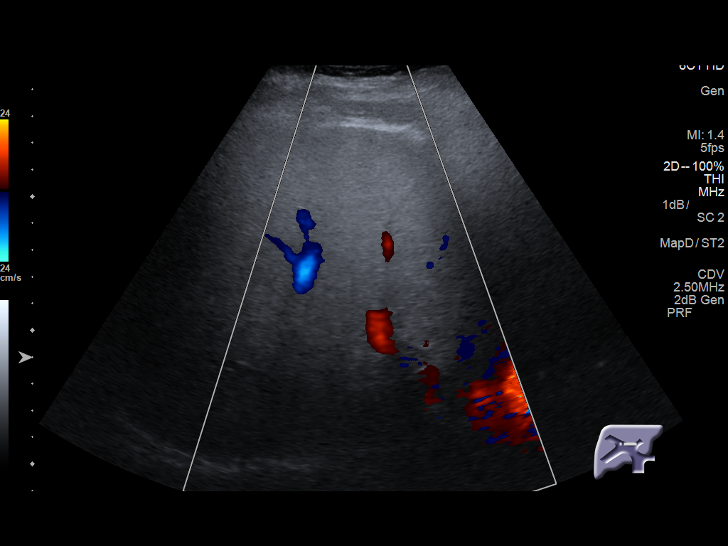
[im 25/28]
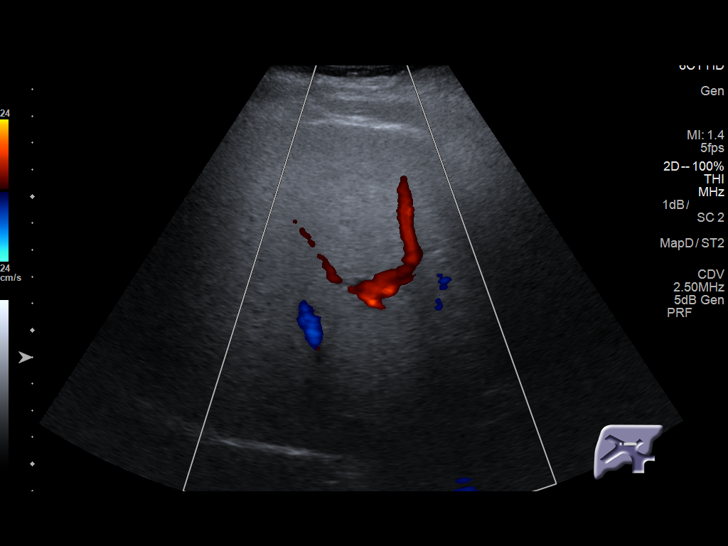
[im 28/28]
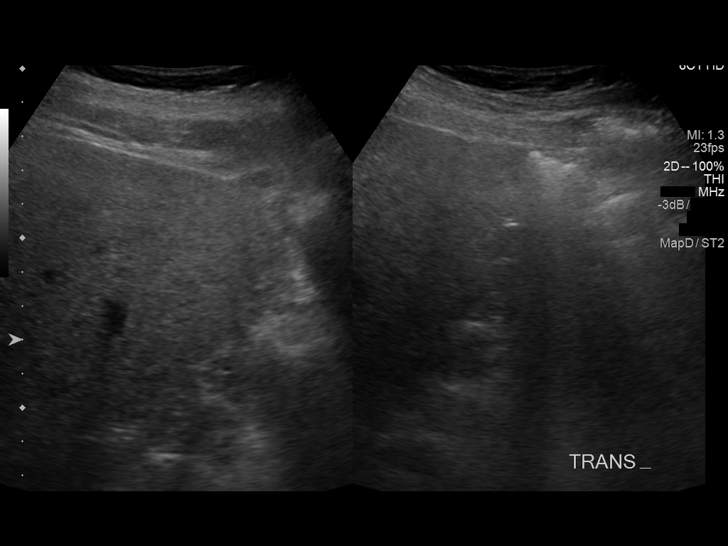

[14 of 25 positions shown; findings below may reference images not displayed]

FINDINGS: Gallbladder:

Surgically absent.

Common bile duct:

Diameter: 5 mm, normal.

Liver:

No focal lesion identified. Unchanged diffusely increased
parenchymal echogenicity. Portal vein is patent on color Doppler
imaging with normal direction of blood flow towards the liver.
IMPRESSION: 1. Unchanged diffusely increased hepatic parenchymal echogenicity,
which can be seen with steatosis or cirrhosis. No focal liver
lesion.

## 2019-08-18 ENCOUNTER — Ambulatory Visit: Payer: Medicaid Other | Admitting: Gastroenterology

## 2019-08-18 ENCOUNTER — Telehealth: Payer: Self-pay | Admitting: Gastroenterology

## 2019-08-18 ENCOUNTER — Encounter: Payer: Self-pay | Admitting: Gastroenterology

## 2019-08-18 NOTE — Telephone Encounter (Signed)
Patient was a no show and letter sent  °

## 2019-09-08 ENCOUNTER — Ambulatory Visit: Payer: Medicaid Other | Attending: Internal Medicine

## 2019-09-08 DIAGNOSIS — Z23 Encounter for immunization: Secondary | ICD-10-CM

## 2019-09-08 NOTE — Progress Notes (Signed)
   Covid-19 Vaccination Clinic  Name:  Philip [name]    MRN: [id number] DOB: Jan 26, 1956  09/08/2019  Mr. Carnett was observed post Covid-19 immunization for 15 minutes without incident. He was provided with Vaccine Information Sheet and instruction to access the V-Safe system.   Mr. Weyer was instructed to call 911 with any severe reactions post vaccine: Marland Kitchen Difficulty breathing  . Swelling of face and throat  . A fast heartbeat  . A bad rash all over body  . Dizziness and weakness   Immunizations Administered    Name Date Dose VIS Date Route   Moderna COVID-19 Vaccine 09/08/2019 10:13 AM 0.5 mL 04/26/2019 Intramuscular   Manufacturer: Levan Hurst   Lot: [id number]   [place]: [id number]

## 2019-10-11 ENCOUNTER — Ambulatory Visit: Payer: Medicaid Other | Attending: Internal Medicine

## 2019-10-11 DIAGNOSIS — Z23 Encounter for immunization: Secondary | ICD-10-CM

## 2019-10-11 NOTE — Progress Notes (Signed)
   Covid-19 Vaccination Clinic  Name:  SIMBA ARTINO    MRN: [id number] DOB: 1956/01/28  10/11/2019  Mr. Dorson was observed post Covid-19 immunization for 15 minutes without incident. He was provided with Vaccine Information Sheet and instruction to access the V-Safe system.   Mr. Masse was instructed to call 911 with any severe reactions post vaccine: Marland Kitchen Difficulty breathing  . Swelling of face and throat  . A fast heartbeat  . A bad rash all over body  . Dizziness and weakness   Immunizations Administered    Name Date Dose VIS Date Route   Moderna COVID-19 Vaccine 10/11/2019  9:59 AM 0.5 mL 04/2019 Intramuscular   Manufacturer: Moderna   Lot: [id number]   [id number]

## 2019-10-13 ENCOUNTER — Telehealth: Payer: Self-pay | Admitting: Family Medicine

## 2019-10-13 DIAGNOSIS — I10 Essential (primary) hypertension: Secondary | ICD-10-CM

## 2019-10-13 MED ORDER — HYDRALAZINE HCL 25 MG PO TABS: ORAL_TABLET | ORAL | 0 refills | Status: DC

## 2019-10-13 NOTE — Telephone Encounter (Signed)
90 DAY SUPPLY SENT AS REQUESTED

## 2019-10-14 ENCOUNTER — Other Ambulatory Visit: Payer: Self-pay | Admitting: Family Medicine

## 2019-10-14 DIAGNOSIS — I10 Essential (primary) hypertension: Secondary | ICD-10-CM

## 2019-12-14 ENCOUNTER — Ambulatory Visit: Payer: Medicaid Other | Admitting: Family Medicine

## 2020-01-09 ENCOUNTER — Encounter: Payer: Self-pay | Admitting: Family Medicine

## 2020-01-09 ENCOUNTER — Ambulatory Visit: Payer: Medicaid Other | Admitting: Family Medicine

## 2020-01-09 ENCOUNTER — Other Ambulatory Visit: Payer: Self-pay

## 2020-01-09 VITALS — BP 137/84 | HR 77 | Temp 98.2°F | Resp 20 | Ht 67.0 in | Wt 198.2 lb

## 2020-01-09 DIAGNOSIS — I10 Essential (primary) hypertension: Secondary | ICD-10-CM

## 2020-01-09 DIAGNOSIS — N529 Male erectile dysfunction, unspecified: Secondary | ICD-10-CM

## 2020-01-09 DIAGNOSIS — E559 Vitamin D deficiency, unspecified: Secondary | ICD-10-CM

## 2020-01-09 DIAGNOSIS — Z720 Tobacco use: Secondary | ICD-10-CM

## 2020-01-09 DIAGNOSIS — E785 Hyperlipidemia, unspecified: Secondary | ICD-10-CM

## 2020-01-09 DIAGNOSIS — K7030 Alcoholic cirrhosis of liver without ascites: Secondary | ICD-10-CM

## 2020-01-09 DIAGNOSIS — R519 Headache, unspecified: Secondary | ICD-10-CM

## 2020-01-09 DIAGNOSIS — J45909 Unspecified asthma, uncomplicated: Secondary | ICD-10-CM | POA: Diagnosis not present

## 2020-01-09 DIAGNOSIS — B351 Tinea unguium: Secondary | ICD-10-CM

## 2020-01-09 DIAGNOSIS — G8929 Other chronic pain: Secondary | ICD-10-CM

## 2020-01-09 DIAGNOSIS — R140 Abdominal distension (gaseous): Secondary | ICD-10-CM

## 2020-01-09 DIAGNOSIS — E1140 Type 2 diabetes mellitus with diabetic neuropathy, unspecified: Secondary | ICD-10-CM

## 2020-01-09 MED ORDER — AMLODIPINE BESYLATE 10 MG PO TABS: 10.0000 mg | ORAL_TABLET | Freq: Every day | ORAL | 1 refills | Status: DC

## 2020-01-09 MED ORDER — ESOMEPRAZOLE MAGNESIUM 40 MG PO CPDR: 40.0000 mg | DELAYED_RELEASE_CAPSULE | Freq: Every day | ORAL | 1 refills | Status: DC

## 2020-01-09 MED ORDER — TOPIRAMATE 50 MG PO TABS: 50.0000 mg | ORAL_TABLET | Freq: Two times a day (BID) | ORAL | 1 refills | Status: DC

## 2020-01-09 MED ORDER — ATORVASTATIN CALCIUM 40 MG PO TABS: 40.0000 mg | ORAL_TABLET | Freq: Every day | ORAL | 3 refills | Status: DC

## 2020-01-09 MED ORDER — GABAPENTIN 300 MG PO CAPS: 300.0000 mg | ORAL_CAPSULE | Freq: Every day | ORAL | 1 refills | Status: DC

## 2020-01-09 MED ORDER — SILDENAFIL CITRATE 20 MG PO TABS: ORAL_TABLET | ORAL | 3 refills | Status: DC

## 2020-01-09 MED ORDER — METOPROLOL SUCCINATE ER 50 MG PO TB24: ORAL_TABLET | ORAL | 1 refills | Status: DC

## 2020-01-09 MED ORDER — HYDRALAZINE HCL 25 MG PO TABS: ORAL_TABLET | ORAL | 1 refills | Status: DC

## 2020-01-09 NOTE — Progress Notes (Signed)
Subjective:  Patient ID: Philip [name],  male    DOB: 1955-11-09  Age: 64 y.o.    CC: Medical Management of Chronic Issues   HPI Philip [name] presents for  follow-up of hypertension. Patient has no history of headache chest pain or shortness of breath or recent cough. Patient also denies symptoms of TIA such as numbness weakness lateralizing. Patient denies side effects from medication. States taking it regularly.  Patient also  in for follow-up of elevated cholesterol. Doing well without complaints on current medication. Denies side effects  including myalgia and arthralgia and nausea. Also in today for liver function testing. Currently no chest pain, shortness of breath or other cardiovascular related symptoms noted.  Follow-up of diabetes. Patient does not  check blood sugar at home. Patient denies symptoms such as excessive hunger or urinary frequency, excessive hunger, nausea No significant hypoglycemic spells noted. Medications reviewed. Pt reports taking them regularly. Pt. denies complication/adverse reaction today.    History Philip [name] has a past medical history of Chronic cholecystitis with calculus (02/04/2013), CVA (cerebral vascular accident) (09/16/2012), Diabetes mellitus without complication ([place]), Erectile dysfunction, Gallstones (01/27/2013), GERD (gastroesophageal reflux disease), H. pylori infection (09/28/2017), Headache(784.0), Hepatitis C, History of kidney stones, History of migraine, Hypertension, Hypertension, Nausea, Seizures ([place]), Short-term memory loss, Sleep apnea, Stroke ([place]), Tobacco user, and Umbilical hernia (09/01/8117).   He has a past surgical history that includes Umbilical hernia repair (N/A, 03/09/2013); Colonoscopy (06/2014); Esophagogastroduodenoscopy (egd) with propofol (N/A, 08/21/2015); Cholecystectomy (N/A, 03/09/2013); Esophagogastroduodenoscopy (egd) with propofol (N/A, 03/10/2017); and biopsy (03/10/2017).   His family history includes Cancer in his  cousin; Colon cancer in his maternal uncle; Heart disease in his maternal grandfather, maternal grandmother, paternal grandfather, and paternal grandmother; Hypertension in his mother.He reports that he has been smoking cigarettes. He started smoking about 41 years ago. He has a 5.00 pack-year smoking history. He has never used smokeless tobacco. He reports current alcohol use of about 3.0 standard drinks of alcohol per week. He reports that he does not use drugs.  Current Outpatient Medications on File Prior to Visit  Medication Sig Dispense Refill  . aspirin EC 81 MG tablet Take 81 mg by mouth daily.     No current facility-administered medications on file prior to visit.    ROS Review of Systems  Constitutional: Negative for fever.  Respiratory: Negative for shortness of breath.   Cardiovascular: Negative for chest pain.  Musculoskeletal: Negative for arthralgias.  Skin: Negative for rash.    Objective:  BP 137/84   Pulse 77   Temp 98.2 F (36.8 C) (Temporal)   Resp 20   Ht _0  (1.702 m)   Wt 198 lb 4 oz (89.9 kg)   SpO2 95%   BMI 31.05 kg/m   BP Readings from Last 3 Encounters:  01/09/20 137/84  06/16/19 139/86  02/17/19 134/88    Wt Readings from Last 3 Encounters:  01/09/20 198 lb 4 oz (89.9 kg)  06/16/19 210 lb 9.6 oz (95.5 kg)  02/17/19 210 lb 6.4 oz (95.4 kg)     Physical Exam Vitals reviewed.  Constitutional:      Appearance: He is well-developed.  HENT:     Head: Normocephalic and atraumatic.     Right Ear: Tympanic membrane and external ear normal. No decreased hearing noted.     Left Ear: Tympanic membrane and external ear normal. No decreased hearing noted.     Mouth/Throat:     Pharynx: No oropharyngeal exudate or posterior  oropharyngeal erythema.  Eyes:     Pupils: Pupils are equal, round, and reactive to light.  Cardiovascular:     Rate and Rhythm: Normal rate and regular rhythm.     Heart sounds: No murmur heard.   Pulmonary:     Effort:  No respiratory distress.     Breath sounds: Normal breath sounds.  Abdominal:     General: Bowel sounds are normal.     Palpations: Abdomen is soft. There is no mass.     Tenderness: There is no abdominal tenderness.  Musculoskeletal:     Cervical back: Normal range of motion and neck supple.     Diabetic Foot Exam - Simple   No data filed        Assessment & Plan:   Philip [name] was seen today for medical management of chronic issues.  Diagnoses and all orders for this visit:  Essential hypertension -     metoprolol succinate (TOPROL-XL) 50 MG 24 hr tablet; TAKE 1 TABLET BY MOUTH ONCE DAILY. TAKE WITH OR IMMEDIATELY FOLLOWING A MEAL -     hydrALAZINE (APRESOLINE) 25 MG tablet; TAKE 1 TABLET BY MOUTH THREE TIMES DAILY -     amLODipine (NORVASC) 10 MG tablet; Take 1 tablet (10 mg total) by mouth daily. -     CBC with Differential/Platelet -     CMP14+EGFR  Asthma, unspecified asthma severity, unspecified whether complicated, unspecified whether persistent -     CBC with Differential/Platelet -     CMP14+EGFR  Hyperlipidemia, unspecified hyperlipidemia type -     atorvastatin (LIPITOR) 40 MG tablet; Take 1 tablet (40 mg total) by mouth daily. For cholesterol -     CBC with Differential/Platelet -     CMP14+EGFR -     Lipid panel  Alcoholic cirrhosis of liver without ascites (HCC) -     CBC with Differential/Platelet -     CMP14+EGFR  Tobacco user -     CBC with Differential/Platelet -     CMP14+EGFR  Erectile dysfunction, unspecified erectile dysfunction type -     sildenafil (REVATIO) 20 MG tablet; Take 2 to 5 tablets once daily as needed for erectile dysfunction -     CBC with Differential/Platelet -     CMP14+EGFR  Chronic nonintractable headache, unspecified headache type -     topiramate (TOPAMAX) 50 MG tablet; Take 1 tablet (50 mg total) by mouth 2 (two) times daily. -     gabapentin (NEURONTIN) 300 MG capsule; Take 1 capsule (300 mg total) by mouth at bedtime. -      CBC with Differential/Platelet -     CMP14+EGFR  Abdominal bloating -     esomeprazole (NEXIUM) 40 MG capsule; Take 1 capsule (40 mg total) by mouth daily. -     CBC with Differential/Platelet -     CMP14+EGFR  Type 2 diabetes mellitus with diabetic neuropathy, without long-term current use of insulin (HCC) -     CMP14+EGFR -     Bayer DCA Hb A1c Waived -     Ambulatory referral to Podiatry  Onychomycosis -     Ambulatory referral to Podiatry  Vitamin D deficiency -     VITAMIN D 25 Hydroxy (Vit-D Deficiency, Fractures)   I have discontinued Jeneen Rinks B. Crafts's pantoprazole. I am also having him maintain his aspirin EC, sildenafil, metoprolol succinate, hydrALAZINE, topiramate, gabapentin, esomeprazole, amLODipine, and atorvastatin.  Meds ordered this encounter  Medications  . sildenafil (REVATIO) 20 MG tablet  Sig: Take 2 to 5 tablets once daily as needed for erectile dysfunction    Dispense:  20 tablet    Refill:  3  . metoprolol succinate (TOPROL-XL) 50 MG 24 hr tablet    Sig: TAKE 1 TABLET BY MOUTH ONCE DAILY. TAKE WITH OR IMMEDIATELY FOLLOWING A MEAL    Dispense:  90 tablet    Refill:  1  . hydrALAZINE (APRESOLINE) 25 MG tablet    Sig: TAKE 1 TABLET BY MOUTH THREE TIMES DAILY    Dispense:  270 tablet    Refill:  1  . topiramate (TOPAMAX) 50 MG tablet    Sig: Take 1 tablet (50 mg total) by mouth 2 (two) times daily.    Dispense:  180 tablet    Refill:  1  . gabapentin (NEURONTIN) 300 MG capsule    Sig: Take 1 capsule (300 mg total) by mouth at bedtime.    Dispense:  90 capsule    Refill:  1  . esomeprazole (NEXIUM) 40 MG capsule    Sig: Take 1 capsule (40 mg total) by mouth daily.    Dispense:  90 capsule    Refill:  1  . amLODipine (NORVASC) 10 MG tablet    Sig: Take 1 tablet (10 mg total) by mouth daily.    Dispense:  90 tablet    Refill:  1  . atorvastatin (LIPITOR) 40 MG tablet    Sig: Take 1 tablet (40 mg total) by mouth daily. For cholesterol     Dispense:  90 tablet    Refill:  3     Follow-up: Return in about 6 months (around 07/11/2020).  [name], M.D.

## 2020-01-10 LAB — CMP14+EGFR
ALT: 15 IU/L (ref 0–44)
AST: 23 IU/L (ref 0–40)
Albumin/Globulin Ratio: 1.4 (ref 1.2–2.2)
Albumin: 4.3 g/dL (ref 3.8–4.8)
Alkaline Phosphatase: 150 IU/L — ABNORMAL HIGH (ref 48–121)
BUN/Creatinine Ratio: 6 — ABNORMAL LOW (ref 10–24)
BUN: 9 mg/dL (ref 8–27)
Bilirubin Total: 0.6 mg/dL (ref 0.0–1.2)
CO2: 22 mmol/L (ref 20–29)
Calcium: 9.3 mg/dL (ref 8.6–10.2)
Chloride: 100 mmol/L (ref 96–106)
Creatinine, Ser: 1.45 mg/dL — ABNORMAL HIGH (ref 0.76–1.27)
GFR calc Af Amer: 58 mL/min/{1.73_m2} — ABNORMAL LOW (ref >59)
GFR calc non Af Amer: 51 mL/min/{1.73_m2} — ABNORMAL LOW (ref >59)
Globulin, Total: 3 g/dL (ref 1.5–4.5)
Glucose: 120 mg/dL — ABNORMAL HIGH (ref 65–99)
Potassium: 4.1 mmol/L (ref 3.5–5.2)
Sodium: 137 mmol/L (ref 134–144)
Total Protein: 7.3 g/dL (ref 6.0–8.5)

## 2020-01-10 LAB — LIPID PANEL
Chol/HDL Ratio: 2 ratio (ref 0.0–5.0)
Cholesterol, Total: 116 mg/dL (ref 100–199)
HDL: 58 mg/dL (ref >39)
LDL Chol Calc (NIH): 40 mg/dL (ref 0–99)
Triglycerides: 99 mg/dL (ref 0–149)
VLDL Cholesterol Cal: 18 mg/dL (ref 5–40)

## 2020-01-10 LAB — CBC WITH DIFFERENTIAL/PLATELET
Basophils Absolute: 0 10*3/uL (ref 0.0–0.2)
Basos: 1 %
EOS (ABSOLUTE): 0.4 10*3/uL (ref 0.0–0.4)
Eos: 5 %
Hematocrit: 43.1 % (ref 37.5–51.0)
Hemoglobin: 15.6 g/dL (ref 13.0–17.7)
Immature Grans (Abs): 0 10*3/uL (ref 0.0–0.1)
Immature Granulocytes: 0 %
Lymphocytes Absolute: 2.4 10*3/uL (ref 0.7–3.1)
Lymphs: 33 %
MCH: 32.1 pg (ref 26.6–33.0)
MCHC: 36.2 g/dL — ABNORMAL HIGH (ref 31.5–35.7)
MCV: 89 fL (ref 79–97)
Monocytes Absolute: 0.6 10*3/uL (ref 0.1–0.9)
Monocytes: 9 %
Neutrophils Absolute: 3.8 10*3/uL (ref 1.4–7.0)
Neutrophils: 52 %
Platelets: 125 10*3/uL — ABNORMAL LOW (ref 150–450)
RBC: 4.86 x10E6/uL (ref 4.14–5.80)
RDW: 12.9 % (ref 11.6–15.4)
WBC: 7.3 10*3/uL (ref 3.4–10.8)

## 2020-01-10 LAB — BAYER DCA HB A1C WAIVED: HB A1C (BAYER DCA - WAIVED): 5.7 % (ref <7.0)

## 2020-01-10 LAB — VITAMIN D 25 HYDROXY (VIT D DEFICIENCY, FRACTURES): Vit D, 25-Hydroxy: 5.8 ng/mL — ABNORMAL LOW (ref 30.0–100.0)

## 2020-01-11 NOTE — Progress Notes (Signed)
Hello Affan,  Your lab result is normal and/or stable.Some minor variations that are not significant are commonly marked abnormal, but do not represent any medical problem for you.  Best regards, Dontrae Morini, M.D.

## 2020-02-24 ENCOUNTER — Ambulatory Visit: Payer: Medicaid Other | Admitting: Podiatry

## 2020-05-02 ENCOUNTER — Other Ambulatory Visit: Payer: Self-pay

## 2020-05-02 ENCOUNTER — Ambulatory Visit (INDEPENDENT_AMBULATORY_CARE_PROVIDER_SITE_OTHER): Payer: Medicaid Other

## 2020-05-02 DIAGNOSIS — Z23 Encounter for immunization: Secondary | ICD-10-CM

## 2020-05-02 NOTE — Progress Notes (Signed)
   Covid-19 Vaccination Clinic  Name:  Philip [name]    MRN: [id number] DOB: March 20, 1956  05/02/2020  [name] was observed post Covid-19 immunization for 15 minutes without incident. He was provided with Vaccine Information Sheet and instruction to access the V-Safe system.   [name] was instructed to call 911 with any severe reactions post vaccine: Marland Kitchen Difficulty breathing  . Swelling of face and throat  . A fast heartbeat  . A bad rash all over body  . Dizziness and weakness   Immunizations Administered    No immunizations on file.

## 2020-06-21 ENCOUNTER — Ambulatory Visit: Payer: Medicaid Other | Admitting: Family Medicine

## 2020-06-25 ENCOUNTER — Ambulatory Visit: Payer: Medicaid Other | Admitting: Family Medicine

## 2020-06-27 ENCOUNTER — Ambulatory Visit: Payer: Medicaid Other | Admitting: Family Medicine

## 2020-06-29 ENCOUNTER — Ambulatory Visit: Payer: Medicaid Other | Admitting: Nurse Practitioner

## 2020-07-05 ENCOUNTER — Encounter: Payer: Self-pay | Admitting: Family Medicine

## 2020-08-28 ENCOUNTER — Other Ambulatory Visit: Payer: Self-pay | Admitting: Family Medicine

## 2020-08-28 DIAGNOSIS — N529 Male erectile dysfunction, unspecified: Secondary | ICD-10-CM

## 2020-10-01 ENCOUNTER — Other Ambulatory Visit: Payer: Self-pay | Admitting: Family Medicine

## 2020-10-01 DIAGNOSIS — N529 Male erectile dysfunction, unspecified: Secondary | ICD-10-CM

## 2020-10-01 DIAGNOSIS — R519 Headache, unspecified: Secondary | ICD-10-CM

## 2020-10-01 DIAGNOSIS — I10 Essential (primary) hypertension: Secondary | ICD-10-CM

## 2020-10-01 DIAGNOSIS — R140 Abdominal distension (gaseous): Secondary | ICD-10-CM

## 2020-10-03 ENCOUNTER — Other Ambulatory Visit: Payer: Self-pay | Admitting: Family Medicine

## 2020-10-03 DIAGNOSIS — N529 Male erectile dysfunction, unspecified: Secondary | ICD-10-CM

## 2020-10-23 ENCOUNTER — Other Ambulatory Visit: Payer: Self-pay | Admitting: Family Medicine

## 2020-10-23 DIAGNOSIS — N529 Male erectile dysfunction, unspecified: Secondary | ICD-10-CM

## 2020-12-06 ENCOUNTER — Other Ambulatory Visit: Payer: Self-pay | Admitting: Family Medicine

## 2020-12-06 DIAGNOSIS — N529 Male erectile dysfunction, unspecified: Secondary | ICD-10-CM

## 2020-12-28 ENCOUNTER — Other Ambulatory Visit: Payer: Self-pay | Admitting: Family Medicine

## 2020-12-28 DIAGNOSIS — N529 Male erectile dysfunction, unspecified: Secondary | ICD-10-CM

## 2020-12-31 ENCOUNTER — Other Ambulatory Visit: Payer: Self-pay | Admitting: Family Medicine

## 2020-12-31 DIAGNOSIS — N529 Male erectile dysfunction, unspecified: Secondary | ICD-10-CM

## 2021-01-11 ENCOUNTER — Other Ambulatory Visit: Payer: Self-pay | Admitting: Family Medicine

## 2021-01-11 DIAGNOSIS — N529 Male erectile dysfunction, unspecified: Secondary | ICD-10-CM

## 2021-01-12 ENCOUNTER — Other Ambulatory Visit: Payer: Self-pay | Admitting: Family Medicine

## 2021-01-12 DIAGNOSIS — N529 Male erectile dysfunction, unspecified: Secondary | ICD-10-CM

## 2021-01-16 ENCOUNTER — Other Ambulatory Visit: Payer: Self-pay | Admitting: Family Medicine

## 2021-01-16 DIAGNOSIS — N529 Male erectile dysfunction, unspecified: Secondary | ICD-10-CM

## 2021-01-17 ENCOUNTER — Other Ambulatory Visit: Payer: Self-pay | Admitting: Family Medicine

## 2021-01-17 DIAGNOSIS — N529 Male erectile dysfunction, unspecified: Secondary | ICD-10-CM

## 2021-03-10 ENCOUNTER — Other Ambulatory Visit: Payer: Self-pay

## 2021-03-10 ENCOUNTER — Encounter (HOSPITAL_COMMUNITY): Payer: Self-pay | Admitting: *Deleted

## 2021-03-10 ENCOUNTER — Emergency Department (HOSPITAL_COMMUNITY): Payer: Medicare Other

## 2021-03-10 ENCOUNTER — Observation Stay (HOSPITAL_COMMUNITY)
Admission: EM | Admit: 2021-03-10 | Discharge: 2021-03-12 | Disposition: A | Discharge status: Home / Self Care | Payer: Medicare Other | Attending: Internal Medicine | Admitting: Internal Medicine

## 2021-03-10 DIAGNOSIS — J45909 Unspecified asthma, uncomplicated: Secondary | ICD-10-CM | POA: Insufficient documentation

## 2021-03-10 DIAGNOSIS — R0602 Shortness of breath: Secondary | ICD-10-CM | POA: Diagnosis not present

## 2021-03-10 DIAGNOSIS — M6281 Muscle weakness (generalized): Principal | ICD-10-CM | POA: Insufficient documentation

## 2021-03-10 DIAGNOSIS — I6782 Cerebral ischemia: Secondary | ICD-10-CM | POA: Insufficient documentation

## 2021-03-10 DIAGNOSIS — E1169 Type 2 diabetes mellitus with other specified complication: Secondary | ICD-10-CM | POA: Diagnosis not present

## 2021-03-10 DIAGNOSIS — Z79899 Other long term (current) drug therapy: Secondary | ICD-10-CM | POA: Diagnosis not present

## 2021-03-10 DIAGNOSIS — I639 Cerebral infarction, unspecified: Secondary | ICD-10-CM

## 2021-03-10 DIAGNOSIS — K279 Peptic ulcer, site unspecified, unspecified as acute or chronic, without hemorrhage or perforation: Secondary | ICD-10-CM | POA: Diagnosis present

## 2021-03-10 DIAGNOSIS — Y900 Blood alcohol level of less than 20 mg/100 ml: Secondary | ICD-10-CM | POA: Insufficient documentation

## 2021-03-10 DIAGNOSIS — Z20822 Contact with and (suspected) exposure to covid-19: Secondary | ICD-10-CM | POA: Insufficient documentation

## 2021-03-10 DIAGNOSIS — F17210 Nicotine dependence, cigarettes, uncomplicated: Secondary | ICD-10-CM | POA: Diagnosis not present

## 2021-03-10 DIAGNOSIS — I10 Essential (primary) hypertension: Secondary | ICD-10-CM | POA: Diagnosis not present

## 2021-03-10 DIAGNOSIS — Z7982 Long term (current) use of aspirin: Secondary | ICD-10-CM | POA: Diagnosis not present

## 2021-03-10 DIAGNOSIS — Z8673 Personal history of transient ischemic attack (TIA), and cerebral infarction without residual deficits: Secondary | ICD-10-CM | POA: Insufficient documentation

## 2021-03-10 DIAGNOSIS — F1990 Other psychoactive substance use, unspecified, uncomplicated: Secondary | ICD-10-CM

## 2021-03-10 DIAGNOSIS — R531 Weakness: Secondary | ICD-10-CM | POA: Diagnosis present

## 2021-03-10 DIAGNOSIS — K7460 Unspecified cirrhosis of liver: Secondary | ICD-10-CM | POA: Diagnosis present

## 2021-03-10 DIAGNOSIS — R29898 Other symptoms and signs involving the musculoskeletal system: Secondary | ICD-10-CM | POA: Diagnosis present

## 2021-03-10 DIAGNOSIS — E119 Type 2 diabetes mellitus without complications: Secondary | ICD-10-CM | POA: Insufficient documentation

## 2021-03-10 DIAGNOSIS — G4733 Obstructive sleep apnea (adult) (pediatric): Secondary | ICD-10-CM | POA: Diagnosis present

## 2021-03-10 DIAGNOSIS — I6602 Occlusion and stenosis of left middle cerebral artery: Secondary | ICD-10-CM | POA: Insufficient documentation

## 2021-03-10 DIAGNOSIS — Z8669 Personal history of other diseases of the nervous system and sense organs: Secondary | ICD-10-CM

## 2021-03-10 LAB — CBC
HCT: 52.7 % — ABNORMAL HIGH (ref 39.0–52.0)
Hemoglobin: 17.9 g/dL — ABNORMAL HIGH (ref 13.0–17.0)
MCH: 31.9 pg (ref 26.0–34.0)
MCHC: 34 g/dL (ref 30.0–36.0)
MCV: 93.8 fL (ref 80.0–100.0)
Platelets: 120 10*3/uL — ABNORMAL LOW (ref 150–400)
RBC: 5.62 MIL/uL (ref 4.22–5.81)
RDW: 13.9 % (ref 11.5–15.5)
WBC: 6.4 10*3/uL (ref 4.0–10.5)
nRBC: 0 % (ref 0.0–0.2)

## 2021-03-10 LAB — DIFFERENTIAL
Abs Immature Granulocytes: 0.04 10*3/uL (ref 0.00–0.07)
Basophils Absolute: 0.1 10*3/uL (ref 0.0–0.1)
Basophils Relative: 1 %
Eosinophils Absolute: 0.4 10*3/uL (ref 0.0–0.5)
Eosinophils Relative: 6 %
Immature Granulocytes: 1 %
Lymphocytes Relative: 23 %
Lymphs Abs: 1.5 10*3/uL (ref 0.7–4.0)
Monocytes Absolute: 0.7 10*3/uL (ref 0.1–1.0)
Monocytes Relative: 11 %
Neutro Abs: 3.8 10*3/uL (ref 1.7–7.7)
Neutrophils Relative %: 58 %

## 2021-03-10 LAB — COMPREHENSIVE METABOLIC PANEL
ALT: 39 U/L (ref 0–44)
AST: 49 U/L — ABNORMAL HIGH (ref 15–41)
Albumin: 3.8 g/dL (ref 3.5–5.0)
Alkaline Phosphatase: 165 U/L — ABNORMAL HIGH (ref 38–126)
Anion gap: 9 (ref 5–15)
BUN: 5 mg/dL — ABNORMAL LOW (ref 8–23)
CO2: 26 mmol/L (ref 22–32)
Calcium: 8.7 mg/dL — ABNORMAL LOW (ref 8.9–10.3)
Chloride: 97 mmol/L — ABNORMAL LOW (ref 98–111)
Creatinine, Ser: 1.18 mg/dL (ref 0.61–1.24)
GFR, Estimated: >60 mL/min (ref >60)
Glucose, Bld: 121 mg/dL — ABNORMAL HIGH (ref 70–99)
Potassium: 4.1 mmol/L (ref 3.5–5.1)
Sodium: 132 mmol/L — ABNORMAL LOW (ref 135–145)
Total Bilirubin: 1 mg/dL (ref 0.3–1.2)
Total Protein: 7.7 g/dL (ref 6.5–8.1)

## 2021-03-10 LAB — URINALYSIS, ROUTINE W REFLEX MICROSCOPIC
Bilirubin Urine: NEGATIVE
Glucose, UA: NEGATIVE mg/dL
Hgb urine dipstick: NEGATIVE
Ketones, ur: NEGATIVE mg/dL
Leukocytes,Ua: NEGATIVE
Nitrite: NEGATIVE
Protein, ur: NEGATIVE mg/dL
Specific Gravity, Urine: 1.006 (ref 1.005–1.030)
pH: 7 (ref 5.0–8.0)

## 2021-03-10 LAB — RESP PANEL BY RT-PCR (FLU A&B, COVID) ARPGX2
Influenza A by PCR: NEGATIVE
Influenza B by PCR: NEGATIVE
SARS Coronavirus 2 by RT PCR: NEGATIVE

## 2021-03-10 LAB — PHOSPHORUS: Phosphorus: 2.3 mg/dL — ABNORMAL LOW (ref 2.5–4.6)

## 2021-03-10 LAB — CBG MONITORING, ED: Glucose-Capillary: 108 mg/dL — ABNORMAL HIGH (ref 70–99)

## 2021-03-10 LAB — RAPID URINE DRUG SCREEN, HOSP PERFORMED
Amphetamines: NOT DETECTED
Barbiturates: NOT DETECTED
Benzodiazepines: NOT DETECTED
Cocaine: POSITIVE — AB
Opiates: NOT DETECTED
Tetrahydrocannabinol: NOT DETECTED

## 2021-03-10 LAB — I-STAT CHEM 8, ED
BUN: 3 mg/dL — ABNORMAL LOW (ref 8–23)
Calcium, Ion: 1.05 mmol/L — ABNORMAL LOW (ref 1.15–1.40)
Chloride: 98 mmol/L (ref 98–111)
Creatinine, Ser: 1.1 mg/dL (ref 0.61–1.24)
Glucose, Bld: 118 mg/dL — ABNORMAL HIGH (ref 70–99)
HCT: 52 % (ref 39.0–52.0)
Hemoglobin: 17.7 g/dL — ABNORMAL HIGH (ref 13.0–17.0)
Potassium: 4.2 mmol/L (ref 3.5–5.1)
Sodium: 135 mmol/L (ref 135–145)
TCO2: 27 mmol/L (ref 22–32)

## 2021-03-10 LAB — APTT: aPTT: 24 seconds (ref 24–36)

## 2021-03-10 LAB — PROTIME-INR
INR: 1 (ref 0.8–1.2)
Prothrombin Time: 13.2 seconds (ref 11.4–15.2)

## 2021-03-10 LAB — MAGNESIUM: Magnesium: 2.5 mg/dL — ABNORMAL HIGH (ref 1.7–2.4)

## 2021-03-10 LAB — ETHANOL: Alcohol, Ethyl (B): <10 mg/dL (ref <10)

## 2021-03-10 MED ORDER — THIAMINE HCL 100 MG PO TABS
100.0000 mg | ORAL_TABLET | Freq: Every day | ORAL | Status: DC
  Administered 2021-03-10 – 2021-03-12 (×3): 100 mg via ORAL
  Filled 2021-03-10 (×3): qty 1

## 2021-03-10 MED ORDER — ACETAMINOPHEN 650 MG RE SUPP: 650.0000 mg | Freq: Four times a day (QID) | RECTAL | Status: DC | PRN

## 2021-03-10 MED ORDER — ACETAMINOPHEN 325 MG PO TABS: 650.0000 mg | ORAL_TABLET | Freq: Four times a day (QID) | ORAL | Status: DC | PRN

## 2021-03-10 MED ORDER — PANTOPRAZOLE SODIUM 40 MG PO TBEC
40.0000 mg | DELAYED_RELEASE_TABLET | Freq: Every day | ORAL | Status: DC
  Administered 2021-03-11 – 2021-03-12 (×2): 40 mg via ORAL
  Filled 2021-03-10 (×2): qty 1

## 2021-03-10 MED ORDER — TOPIRAMATE 25 MG PO TABS
50.0000 mg | ORAL_TABLET | Freq: Every evening | ORAL | Status: DC
  Administered 2021-03-10 – 2021-03-11 (×2): 50 mg via ORAL
  Filled 2021-03-10 (×2): qty 2

## 2021-03-10 MED ORDER — ENOXAPARIN SODIUM 40 MG/0.4ML IJ SOSY
40.0000 mg | PREFILLED_SYRINGE | INTRAMUSCULAR | Status: DC
  Administered 2021-03-11 – 2021-03-12 (×2): 40 mg via SUBCUTANEOUS
  Filled 2021-03-10 (×2): qty 0.4

## 2021-03-10 MED ORDER — HYDRALAZINE HCL 20 MG/ML IJ SOLN: 10.0000 mg | INTRAMUSCULAR | Status: DC | PRN

## 2021-03-10 MED ORDER — THIAMINE HCL 100 MG/ML IJ SOLN: 100.0000 mg | Freq: Every day | INTRAMUSCULAR | Status: DC

## 2021-03-10 MED ORDER — LORAZEPAM 2 MG/ML IJ SOLN: 1.0000 mg | INTRAMUSCULAR | Status: DC | PRN

## 2021-03-10 MED ORDER — ASPIRIN EC 325 MG PO TBEC
325.0000 mg | DELAYED_RELEASE_TABLET | Freq: Once | ORAL | Status: AC
  Administered 2021-03-10: 325 mg via ORAL
  Filled 2021-03-10: qty 1

## 2021-03-10 MED ORDER — INSULIN ASPART 100 UNIT/ML IJ SOLN
0.0000 [IU] | Freq: Three times a day (TID) | INTRAMUSCULAR | Status: DC
  Administered 2021-03-11: 2 [IU] via SUBCUTANEOUS
  Administered 2021-03-12: 1 [IU] via SUBCUTANEOUS

## 2021-03-10 MED ORDER — ONDANSETRON HCL 4 MG PO TABS: 4.0000 mg | ORAL_TABLET | Freq: Four times a day (QID) | ORAL | Status: DC | PRN

## 2021-03-10 MED ORDER — ONDANSETRON HCL 4 MG/2ML IJ SOLN: 4.0000 mg | Freq: Four times a day (QID) | INTRAMUSCULAR | Status: DC | PRN

## 2021-03-10 MED ORDER — LORAZEPAM 2 MG/ML IJ SOLN
0.5000 mg | Freq: Once | INTRAMUSCULAR | Status: AC
  Administered 2021-03-11: 0.5 mg via INTRAVENOUS
  Filled 2021-03-10: qty 1

## 2021-03-10 MED ORDER — NICOTINE 21 MG/24HR TD PT24
21.0000 mg | MEDICATED_PATCH | Freq: Every day | TRANSDERMAL | Status: DC
  Administered 2021-03-10: 21 mg via TRANSDERMAL
  Filled 2021-03-10 (×2): qty 1

## 2021-03-10 MED ORDER — ATORVASTATIN CALCIUM 40 MG PO TABS
40.0000 mg | ORAL_TABLET | Freq: Every day | ORAL | Status: DC
  Administered 2021-03-11: 40 mg via ORAL
  Filled 2021-03-10: qty 1

## 2021-03-10 MED ORDER — HYDRALAZINE HCL 20 MG/ML IJ SOLN
10.0000 mg | INTRAMUSCULAR | Status: DC | PRN
  Filled 2021-03-10: qty 1

## 2021-03-10 MED ORDER — ASPIRIN EC 81 MG PO TBEC
81.0000 mg | DELAYED_RELEASE_TABLET | Freq: Every day | ORAL | Status: DC
  Administered 2021-03-11 – 2021-03-12 (×2): 81 mg via ORAL
  Filled 2021-03-10 (×2): qty 1

## 2021-03-10 MED ORDER — FOLIC ACID 1 MG PO TABS
1.0000 mg | ORAL_TABLET | Freq: Every day | ORAL | Status: DC
  Administered 2021-03-10 – 2021-03-12 (×3): 1 mg via ORAL
  Filled 2021-03-10 (×3): qty 1

## 2021-03-10 MED ORDER — LORAZEPAM 1 MG PO TABS: 1.0000 mg | ORAL_TABLET | ORAL | Status: DC | PRN

## 2021-03-10 MED ORDER — ADULT MULTIVITAMIN W/MINERALS CH
1.0000 | ORAL_TABLET | Freq: Every day | ORAL | Status: DC
  Administered 2021-03-10 – 2021-03-12 (×3): 1 via ORAL
  Filled 2021-03-10 (×3): qty 1

## 2021-03-10 MED ORDER — INSULIN ASPART 100 UNIT/ML IJ SOLN
0.0000 [IU] | Freq: Every day | INTRAMUSCULAR | Status: DC
  Filled 2021-03-10: qty 1

## 2021-03-10 MED ORDER — POLYETHYLENE GLYCOL 3350 17 G PO PACK: 17.0000 g | PACK | Freq: Every day | ORAL | Status: DC | PRN

## 2021-03-10 NOTE — Consult Note (Signed)
[facility] TeleNeurology Consult Services  Stat Consult  Patient Name:   Philip [name], Philip [name] Date of Birth:   08/13/1955 Identification Number:   MRN - [id number] Date of Service:   03/10/2021 14:26:48  Diagnosis:       I63.9 - Cerebrovascular accident (CVA), unspecified mechanism ([place])       R27.0 - Ataxia (unsp)  Impression 71M HX of HTN, diabetes, prior stroke in 2014 with no residual deficits. Presents to ED with incoordination of BLEs since yesterday evening. Ataxic gait on exam.   Physical exam revealing for ataxia Labs pending Imaging with [facility] unrevealing    Etiology of presentation could in the setting of hypertensive encephalopathy. Would consider new stroke in the differential. Consider lumbar spine pathology.   -Allow permissive HTN to 220/110 and below. ASA 81. -MRI brain w/o con -Head and neck vessel imaging -2D echo -Labs for: UA, Utox, thiamine, ammonia, RPR, TSH, B12, ABG, alcohol, A1C, CBC, CMP, Lipids, LFTs, CPK, lactic acid  Our recommendations are outlined below.  Laboratory Studies: Recommend Lipid panel Hemoglobin A1c  Nursing Recommendations: Telemetry, IV Fluids, avoid dextrose containing fluids, Maintain euglycemia Neuro checks q4 hrs x 24 hrs and then per shift Head of bed 30 degrees  Consultations: Recommend Speech therapy if failed dysphagia screen Physical therapy/Occupational therapy   Metrics: [facility] Notification Time: 03/10/2021 14:24:51 Stamp Time: 03/10/2021 14:26:48 Callback Response Time: 03/10/2021 14:33:34   ----------------------------------------------------------------------------------------------------  Chief Complaint: ataxia  History of Present Illness: Patient is a 65 year old Male.   71M HX of HTN, diabetes, prior stroke in 2014 with no residual deficits. Presents to ED with incoordination of BLEs since yesterday evening. Ataxic gait on exam. On arrival to the ED symptoms persist.  BP found to be 182. Was taken for [facility] and further evaluation.           Examination: BP(182//110), Pulse(100), Blood Glucose(00) 1A: Level of Consciousness - Alert; keenly responsive + 0 1B: Ask Month and Age - 1 Question Right + 1 1C: Blink Eyes & Squeeze Hands - Performs Both Tasks + 0 2: Test Horizontal Extraocular Movements - Normal + 0 3: Test Visual Fields - No Visual Loss + 0 4: Test Facial Palsy (Use Grimace if Obtunded) - Normal symmetry + 0 5A: Test Left Arm Motor Drift - No Drift for 10 Seconds + 0 5B: Test Right Arm Motor Drift - No Drift for 10 Seconds + 0 6A: Test Left Leg Motor Drift - No Drift for 5 Seconds + 0 6B: Test Right Leg Motor Drift - No Drift for 5 Seconds + 0 7: Test Limb Ataxia (FNF/Heel-Shin) - No Ataxia + 0 8: Test Sensation - Normal; No sensory loss + 0 9: Test Language/Aphasia - Normal; No aphasia + 0 10: Test Dysarthria - Normal + 0 11: Test Extinction/Inattention - No abnormality + 0  NIHSS Score: 1 NIHSS Free Text : ataxia     Patient / Family was informed the Neurology Consult would occur via TeleHealth consult by way of interactive audio and video telecommunications and consented to receiving care in this manner.  Patient is being evaluated for possible acute neurologic impairment and high probability of imminent or life - threatening deterioration.I spent total of 30 minutes providing care to this patient, including time for face to face visit via telemedicine, review of medical records, imaging studies and discussion of findings with providers, the patient and / or family.   Dr Mickel Duhamel   [facility] [phone]  Case [id number]

## 2021-03-10 NOTE — ED Triage Notes (Signed)
States he has weakness in both legs onset last night, states it is difficult to walk

## 2021-03-10 NOTE — ED Provider Notes (Signed)
[facility] EMERGENCY DEPARTMENT Provider Note   CSN: [id number] Arrival date & time: 03/10/21  1233     History Chief Complaint  Patient presents with   Extremity Weakness    Philip [name] is a 65 y.o. male.  Patient with onset last evening of lower extremities not working properly.  Does seem to be coordination problem.  Patient having difficulty walking.  No pain associated with it.  No back pain.  Upper extremities fine speech is fine visual changes are fine.  Patient's past medical history is significant for hypertension diabetes prior history of stroke in 2014.  Patient states no residual.  No headache.  Patient also known to have chronic gallstones.  Patient has chronic numbness to the bottom of both feet but that is been there long-term.      Past Medical History:  Diagnosis Date   Chronic cholecystitis with calculus 02/04/2013   CVA (cerebral vascular accident) 09/16/2012   Diabetes mellitus without complication ([place])    borderline   Erectile dysfunction    Gallstones 01/27/2013   GERD (gastroesophageal reflux disease)    H. pylori infection 09/28/2017   Headache(784.0)    "Headache, I dont think they are migranes"   Hepatitis C    [place], completed 06/2015 (Dr. Linus Salmons) , AUG 2017 SVR   History of kidney stones    history of kidney stones   History of migraine    takes Topamax daily   Hypertension    takes Hydralazine,Maxzide,Clonidine,and Amlodipine daily   Hypertension    also takes Metoprolol daily   Nausea    takes Zofran daily   Seizures (HCC)    off Dilantin daily.  last one was when he had a stroke.   Short-term memory loss    due to stroke    Sleep apnea    CPAP- tries to do it everyday.   Stroke ([place])    .  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   Hepatic  cirrhosis ([place]) 06/28/2015   Diabetes mellitus ([place]) 06/08/2014   Hyperglycemia 06/07/2014   Proteinuria 06/07/2014   Tobacco user    Erectile dysfunction    Hyperlipidemia 12/13/2012   OSA (obstructive sleep apnea) 11/03/2012   HTN (hypertension) 09/16/2012   History of seizure disorder, since CVA 09/16/2012   Asthma 09/16/2012    Past Surgical History:  Procedure Laterality Date   BIOPSY  03/10/2017   Procedure: BIOPSY;  Surgeon: Danie Binder, MD;  [facility];  Service: Endoscopy;;  gastric biopsy    CHOLECYSTECTOMY N/A 03/09/2013   Procedure: LAPAROSCOPIC CHOLECYSTECTOMY WITH INTRAOPERATIVE CHOLANGIOGRAM;  Surgeon: Imogene Burn. Georgette Dover, MD;  Location: [facility];  Service: General;  Laterality: N/A;   COLONOSCOPY  06/2014   Dr. Olevia Perches: Three sessile polyps were found in the rectum x2 and descending. tubular adenomas. colonoscopy in 06/2019   ESOPHAGOGASTRODUODENOSCOPY (EGD) WITH PROPOFOL N/A 08/21/2015   Dr. Oneida Alar: normal esophagus, non-bleeding gastric ulcers/gastritis (H.pylori)    ESOPHAGOGASTRODUODENOSCOPY (EGD) WITH PROPOFOL N/A 03/10/2017   Procedure: ESOPHAGOGASTRODUODENOSCOPY (EGD) WITH PROPOFOL;  Surgeon: Danie Binder, MD;  [facility];  Service: Endoscopy;  Laterality: N/A;  1:00pm-rescheduled to 10\16 @ 1:27NT    UMBILICAL HERNIA REPAIR N/A 03/09/2013   Procedure: HERNIA REPAIR UMBILICAL ADULT;  Surgeon: Imogene Burn.  Georgette Dover, MD;  Location: [facility];  Service: General;  Laterality: N/A;       Family History  Problem Relation Age of Onset   Heart disease Maternal Grandmother    Heart disease Maternal Grandfather    Heart disease Paternal Grandmother    Heart disease Paternal Grandfather    Cancer Cousin    Hypertension Mother    Colon cancer Maternal Uncle    Rectal cancer Neg Hx    Stomach cancer Neg Hx     Social History   Tobacco Use   Smoking status: Every Day    Packs/day: 0.20    Years: 25.00    Pack years: 5.00    Types: Cigarettes     Start date: 05/26/1978   Smokeless tobacco: Never   Tobacco comments:    slowing down  Vaping Use   Vaping Use: Never used  Substance Use Topics   Alcohol use: Yes    Alcohol/week: 3.0 standard drinks    Types: 2 Cans of beer, 1 Standard drinks or equivalent per week    Comment: intermittent beer every few days; never more than 2 beers per sitting.   Drug use: No    Frequency: 1.0 times per week    Home Medications Prior to Admission medications   Medication Sig Start Date End Date Taking? Authorizing Provider  amLODipine (NORVASC) 10 MG tablet Take 1 tablet (10 mg total) by mouth daily. (NEEDS TO BE SEEN BEFORE NEXT REFILL) 10/01/20  Yes [name], MD  aspirin EC 81 MG tablet Take 81 mg by mouth daily.   Yes [provider]  atorvastatin (LIPITOR) 40 MG tablet Take 1 tablet (40 mg total) by mouth daily. For cholesterol 01/09/20  Yes Stacks, Cletus Gash, MD  esomeprazole (NEXIUM) 40 MG capsule Take 1 capsule (40 mg total) by mouth daily. (NEEDS TO BE SEEN BEFORE NEXT REFILL) 10/01/20  Yes Stacks, Cletus Gash, MD  gabapentin (NEURONTIN) 300 MG capsule Take 1 capsule (300 mg total) by mouth at bedtime. (NEEDS TO BE SEEN BEFORE NEXT REFILL) 10/01/20  Yes Stacks, Cletus Gash, MD  hydrALAZINE (APRESOLINE) 25 MG tablet TAKE 1 TABLET BY MOUTH THREE TIMES DAILY (NEEDS TO BE SEEN BEFORE NEXT REFILL) 10/01/20  Yes Stacks, Cletus Gash, MD  metoprolol succinate (TOPROL-XL) 50 MG 24 hr tablet TAKE 1 TABLET BY MOUTH ONCE DAILY. TAKE WITH OR IMMEDIATELY FOLLOWING A MEAL (NEEDS TO BE SEEN BEFORE NEXT REFILL) 10/01/20  Yes Stacks, Cletus Gash, MD  sildenafil (REVATIO) 20 MG tablet TAKE 2 TO 5 TABLETS BY MOUTH ONCE DAILY AS NEEDED FOR ERECTILE DYSFUNCTION Patient taking differently: Take 40-100 mg by mouth daily as needed. TAKE 2 TO 5 TABLETS BY MOUTH ONCE DAILY AS NEEDED FOR ERECTILE DYSFUNCTION 12/06/20  Yes [name], MD  topiramate (TOPAMAX) 50 MG tablet Take 1 tablet (50 mg total) by mouth 2 (two) times  daily. Patient taking differently: Take 50 mg by mouth every evening. 01/09/20  Yes Stacks, Cletus Gash, MD  amLODipine (NORVASC) 10 MG tablet Take 1 tablet (10 mg total) by mouth daily. (NEEDS TO BE SEEN BEFORE NEXT REFILL) Patient not taking: Reported on 03/10/2021 10/01/20   [name], MD    Allergies    Patient has no known allergies.  Review of Systems   Review of Systems  Constitutional:  Negative for chills and fever.  HENT:  Negative for ear pain and sore throat.   Eyes:  Negative for pain and visual disturbance.  Respiratory:  Negative for cough and shortness of breath.  Cardiovascular:  Negative for chest pain and palpitations.  Gastrointestinal:  Negative for abdominal pain and vomiting.  Genitourinary:  Negative for dysuria and hematuria.  Musculoskeletal:  Negative for arthralgias and back pain.  Skin:  Negative for color change and rash.  Neurological:  Positive for weakness and numbness. Negative for seizures, syncope, speech difficulty and headaches.  All other systems reviewed and are negative.  Physical Exam Updated Vital Signs BP (!) 182/110   Pulse (!) 59   Temp 98.2 F (36.8 C) (Oral)   Resp 13   SpO2 100%   Physical Exam Vitals and nursing note reviewed.  Constitutional:      General: He is not in acute distress.    Appearance: Normal appearance. He is well-developed.  HENT:     Head: Normocephalic and atraumatic.  Eyes:     Extraocular Movements: Extraocular movements intact.     Conjunctiva/sclera: Conjunctivae normal.     Pupils: Pupils are equal, round, and reactive to light.  Cardiovascular:     Rate and Rhythm: Normal rate and regular rhythm.     Heart sounds: No murmur heard. Pulmonary:     Effort: Pulmonary effort is normal. No respiratory distress.     Breath sounds: Normal breath sounds.  Abdominal:     Palpations: Abdomen is soft.     Tenderness: There is no abdominal tenderness.  Musculoskeletal:        General: Normal range of  motion.     Cervical back: Normal range of motion and neck supple.  Skin:    General: Skin is warm and dry.  Neurological:     General: No focal deficit present.     Mental Status: He is alert and oriented to person, place, and time.     Cranial Nerves: No cranial nerve deficit.     Sensory: Sensory deficit present.     Motor: No weakness.     Coordination: Coordination abnormal.     Comments: Patient strength in the lower extremities seems to be present certainly at the foot.  Chronic numbness to the bottom of both feet.  Heel-to-shin seems to be very challenging for him he is able to do it but it just does not seem normal.  Upper extremities seem to be fine.  Speech is fine.    ED Results / Procedures / Treatments   Labs (all labs ordered are listed, but only abnormal results are displayed) Labs Reviewed  CBC - Abnormal; Notable for the following components:      Result Value   Hemoglobin 17.9 (*)    HCT 52.7 (*)    Platelets 120 (*)    All other components within normal limits  COMPREHENSIVE METABOLIC PANEL - Abnormal; Notable for the following components:   Sodium 132 (*)    Chloride 97 (*)    Glucose, Bld 121 (*)    BUN 5 (*)    Calcium 8.7 (*)    AST 49 (*)    Alkaline Phosphatase 165 (*)    All other components within normal limits  I-STAT CHEM 8, ED - Abnormal; Notable for the following components:   BUN 3 (*)    Glucose, Bld 118 (*)    Calcium, Ion 1.05 (*)    Hemoglobin 17.7 (*)    All other components within normal limits  RESP PANEL BY RT-PCR (FLU A&B, COVID) ARPGX2  ETHANOL  PROTIME-INR  APTT  DIFFERENTIAL  RAPID URINE DRUG SCREEN, HOSP PERFORMED  URINALYSIS, ROUTINE W REFLEX MICROSCOPIC  EKG None  Radiology CT HEAD WO CONTRAST  Result Date: 03/10/2021 CLINICAL DATA:  Bilateral leg weakness EXAM: CT HEAD WITHOUT CONTRAST TECHNIQUE: Contiguous axial images were obtained from the base of the skull through the vertex without intravenous contrast.  COMPARISON:  02/05/2009 FINDINGS: Brain: No evidence of acute infarction, hemorrhage, hydrocephalus, extra-axial collection or mass lesion/mass effect. Vascular: No hyperdense vessel or unexpected calcification. Skull: Normal. Negative for fracture or focal lesion. Sinuses/Orbits: Partial opacification of the left maxillary sinus. Otherwise clear. Other: None. IMPRESSION: 1. No acute intracranial findings. 2. Partial opacification of the left maxillary sinus. Electronically Signed   By: Davina Poke D.O.   On: 03/10/2021 14:33    Procedures Procedures   Medications Ordered in ED Medications - No data to display  ED Course  I have reviewed the triage vital signs and the nursing notes.  Pertinent labs & imaging results that were available during my care of the patient were reviewed by me and considered in my medical decision making (see chart for details).    MDM Rules/Calculators/A&P                         CRITICAL CARE Performed by: Fredia Sorrow Total critical care time: 45 minutes Critical care time was exclusive of separately billable procedures and treating other patients. Critical care was necessary to treat or prevent imminent or life-threatening deterioration. Critical care was time spent personally by me on the following activities: development of treatment plan with patient and/or surrogate as well as nursing, discussions with consultants, evaluation of patient's response to treatment, examination of patient, obtaining history from patient or surrogate, ordering and performing treatments and interventions, ordering and review of laboratory studies, ordering and review of radiographic studies, pulse oximetry and re-evaluation of patient's condition.   Patient with a lot of risk factors for CVA.  Had a CVA in the past in 2014.  This possibly could be basal ganglia or caudate type infarct.  Not associated with any incontinence with a lower extremity weakness no no back  pain.  Focal order set ordered.  Teleneurology consult.  Patient is out of the tPA window.  CT head ordered.  CT had had no acute findings. Teleneurology is currently evaluating patient.  We do not have MRI currently available.  Feel that patient probably will require admission MRI in the morning.  But we will see what teleneurology says.   Final Clinical Impression(s) / ED Diagnoses Final diagnoses:  Cerebrovascular accident (CVA), unspecified mechanism [facility])    Rx / [place] Orders ED Discharge Orders     None        Fredia Sorrow, MD 03/10/21 [phone]

## 2021-03-10 NOTE — H&P (Signed)
History and Physical    MACGREGOR AESCHLIMAN WUJ:[id number] DOB: 1955/10/01 DOA: 03/10/2021  PCP: [name], MD   Patient coming from: Home  I have personally briefly reviewed patient's old medical records in [place]  Chief Complaint:Bilateral leg weakness  HPI: Philip [name] is a 65 y.o. male with medical history significant for prior CVA, alcohol abuse, diabetes mellitus, hepatic cirrhosis, hypertension, peptic ulcer disease. Presented to the ED with complaints of weakness in both legs that started at about 7 PM yesterday.  He reports he was out walking yesterday, came home sat on his bed, and he was trying to get up and realized that he was barely able to stand or even walk.  Has chronic bilateral lower extremity numbness that is unchanged.  No weakness involving his arms or legs.  No voice change no facial asymmetry. Today his symptoms are the same as it was yesterday.  ED Course: Blood pressure elevated, systolic 956-213.  Temperature 98.2.  Heart rate 59- 70.  Head CT without acute abnormality.  UDS positive for cocaine. Teleneurology was consulted and, etiology of presentation could be hypertensive encephalopathy, new stroke, and to consider lumbar spine pathology.  Recommended admission for stroke work-up to include MRI brain.   Review of Systems: As per HPI all other systems reviewed and negative.  Past Medical History:  Diagnosis Date   Chronic cholecystitis with calculus 02/04/2013   CVA (cerebral vascular accident) 09/16/2012   Diabetes mellitus without complication ([place])    borderline   Erectile dysfunction    Gallstones 01/27/2013   GERD (gastroesophageal reflux disease)    H. pylori infection 09/28/2017   Headache(784.0)    "Headache, I dont think they are migranes"   Hepatitis C    [place], completed 06/2015 (Dr. Linus Salmons) , AUG 2017 SVR   History of kidney stones    history of kidney stones   History of migraine    takes Topamax daily   Hypertension    takes  Hydralazine,Maxzide,Clonidine,and Amlodipine daily   Hypertension    also takes Metoprolol daily   Nausea    takes Zofran daily   Seizures (HCC)    off Dilantin daily.  last one was when he had a stroke.   Short-term memory loss    due to stroke    Sleep apnea    CPAP- tries to do it everyday.   Stroke ([place])    .  Short term memory loss.; left sided weakness   Tobacco user    Umbilical hernia 0/86/5784    Past Surgical History:  Procedure Laterality Date   BIOPSY  03/10/2017   Procedure: BIOPSY;  Surgeon: Danie Binder, MD;  [facility];  Service: Endoscopy;;  gastric biopsy    CHOLECYSTECTOMY N/A 03/09/2013   Procedure: LAPAROSCOPIC CHOLECYSTECTOMY WITH INTRAOPERATIVE CHOLANGIOGRAM;  Surgeon: Imogene Burn. Georgette Dover, MD;  Location: [facility];  Service: General;  Laterality: N/A;   COLONOSCOPY  06/2014   Dr. Olevia Perches: Three sessile polyps were found in the rectum x2 and descending. tubular adenomas. colonoscopy in 06/2019   ESOPHAGOGASTRODUODENOSCOPY (EGD) WITH PROPOFOL N/A 08/21/2015   Dr. Oneida Alar: normal esophagus, non-bleeding gastric ulcers/gastritis (H.pylori)    ESOPHAGOGASTRODUODENOSCOPY (EGD) WITH PROPOFOL N/A 03/10/2017   Procedure: ESOPHAGOGASTRODUODENOSCOPY (EGD) WITH PROPOFOL;  Surgeon: Danie Binder, MD;  Location: [facility];  Service: Endoscopy;  Laterality: N/A;  1:00pm-rescheduled to 10\16 @ 6:96EX    UMBILICAL HERNIA REPAIR N/A 03/09/2013   Procedure: HERNIA REPAIR UMBILICAL ADULT;  Surgeon: Rodman Key  Oren Section, MD;  Location: [facility];  Service: General;  Laterality: N/A;     reports that he has been smoking cigarettes. He started smoking about 42 years ago. He has a 5.00 pack-year smoking history. He has never used smokeless tobacco. He reports current alcohol use of about 3.0 standard drinks per week. He reports that he does not use drugs.  No Known Allergies  Family History  Problem Relation Age of Onset   Heart disease Maternal Grandmother    Heart disease  Maternal Grandfather    Heart disease Paternal Grandmother    Heart disease Paternal Grandfather    Cancer Cousin    Hypertension Mother    Colon cancer Maternal Uncle    Rectal cancer Neg Hx    Stomach cancer Neg Hx    Prior to Admission medications   Medication Sig Start Date End Date Taking? Authorizing Provider  amLODipine (NORVASC) 10 MG tablet Take 1 tablet (10 mg total) by mouth daily. (NEEDS TO BE SEEN BEFORE NEXT REFILL) 10/01/20  Yes [name], MD  aspirin EC 81 MG tablet Take 81 mg by mouth daily.   Yes [provider]  atorvastatin (LIPITOR) 40 MG tablet Take 1 tablet (40 mg total) by mouth daily. For cholesterol 01/09/20  Yes Stacks, Cletus Gash, MD  esomeprazole (NEXIUM) 40 MG capsule Take 1 capsule (40 mg total) by mouth daily. (NEEDS TO BE SEEN BEFORE NEXT REFILL) 10/01/20  Yes Stacks, Cletus Gash, MD  gabapentin (NEURONTIN) 300 MG capsule Take 1 capsule (300 mg total) by mouth at bedtime. (NEEDS TO BE SEEN BEFORE NEXT REFILL) 10/01/20  Yes Stacks, Cletus Gash, MD  hydrALAZINE (APRESOLINE) 25 MG tablet TAKE 1 TABLET BY MOUTH THREE TIMES DAILY (NEEDS TO BE SEEN BEFORE NEXT REFILL) 10/01/20  Yes Stacks, Cletus Gash, MD  metoprolol succinate (TOPROL-XL) 50 MG 24 hr tablet TAKE 1 TABLET BY MOUTH ONCE DAILY. TAKE WITH OR IMMEDIATELY FOLLOWING A MEAL (NEEDS TO BE SEEN BEFORE NEXT REFILL) 10/01/20  Yes Stacks, Cletus Gash, MD  sildenafil (REVATIO) 20 MG tablet TAKE 2 TO 5 TABLETS BY MOUTH ONCE DAILY AS NEEDED FOR ERECTILE DYSFUNCTION Patient taking differently: Take 40-100 mg by mouth daily as needed. TAKE 2 TO 5 TABLETS BY MOUTH ONCE DAILY AS NEEDED FOR ERECTILE DYSFUNCTION 12/06/20  Yes [name], MD  topiramate (TOPAMAX) 50 MG tablet Take 1 tablet (50 mg total) by mouth 2 (two) times daily. Patient taking differently: Take 50 mg by mouth every evening. 01/09/20  Yes Stacks, Cletus Gash, MD  amLODipine (NORVASC) 10 MG tablet Take 1 tablet (10 mg total) by mouth daily. (NEEDS TO BE SEEN BEFORE NEXT  REFILL) Patient not taking: Reported on 03/10/2021 10/01/20   [name], MD    Physical Exam: Vitals:   03/10/21 1700 03/10/21 1730 03/10/21 1800 03/10/21 1806  BP: (!) 179/119 (!) 195/119 (!) 214/177 (!) 206/121  Pulse: 62 62 67 62  Resp: 17   19  Temp:      TempSrc:      SpO2: 100% 100% 98% 99%    Constitutional: NAD, calm, comfortable Vitals:   03/10/21 1700 03/10/21 1730 03/10/21 1800 03/10/21 1806  BP: (!) 179/119 (!) 195/119 (!) 214/177 (!) 206/121  Pulse: 62 62 67 62  Resp: 17   19  Temp:      TempSrc:      SpO2: 100% 100% 98% 99%   Eyes: PERRL, lids and conjunctivae normal ENMT: Mucous membranes are moist.  Neck: normal, supple, no masses, no thyromegaly Respiratory: clear  to auscultation bilaterally, no wheezing, no crackles. Normal respiratory effort. No accessory muscle use.  Cardiovascular: Regular rate and rhythm, no murmurs / rubs / gallops. No extremity edema. 2+ pedal pulses.  Abdomen: no tenderness, no masses palpated. No hepatosplenomegaly. Bowel sounds positive.  Musculoskeletal: He keeps holding the dorsal aspect of his right thigh, reporting that is where he feels most weak, no clubbing / cyanosis. No joint deformity upper and lower extremities. Good ROM, no contractures.  Skin: no rashes, lesions, ulcers. No induration Neurologic:  Neurological:     Mental Status: he is alert.     GCS: GCS eye subscore is 4. GCS verbal subscore is 5. GCS motor subscore is 6.     Comments: Mental Status:  Alert, oriented, thought content appropriate, able to give a coherent history. Speech fluent without evidence of aphasia. Able to follow 2 step commands without difficulty.  Cranial Nerves:  II:  Peripheral visual fields grossly normal, pupils equal, round, reactive to light III,IV, VI: ptosis not present, extra-ocular motions intact bilaterally  V,VII: smile symmetric, eyebrows raise symmetric, facial light touch sensation equal VIII: hearing grossly normal to  voice  X:   XI: bilateral shoulder shrug symmetric and strong XII: midline tongue extension without fassiculations Motor:  Normal tone.  5/5 strength in bilateral upper extremities, 4+ /5 strength right lower extremity, 5/5 left lower extremity. Sensory: Sensation intact to light touch in all extremities.  Cerebellar: normal finger-to-nose with bilateral upper extremities. Normal heel-to -shin balance bilaterally of the lower extremity.   CV: distal pulses palpable throughout    Psychiatric: Normal judgment and insight. Alert and oriented x 3. Normal mood.   Labs on Admission: I have personally reviewed following labs and imaging studies  CBC: Recent Labs  Lab 03/10/21 1355 03/10/21 1431  WBC 6.4  --   NEUTROABS 3.8  --   HGB 17.9* 17.7*  HCT 52.7* 52.0  MCV 93.8  --   PLT 120*  --    Basic Metabolic Panel: Recent Labs  Lab 03/10/21 1355 03/10/21 1431  NA 132* 135  K 4.1 4.2  CL 97* 98  CO2 26  --   GLUCOSE 121* 118*  BUN 5* 3*  CREATININE 1.18 1.10  CALCIUM 8.7*  --    GFR: CrCl cannot be calculated (Unknown ideal weight.). Liver Function Tests: Recent Labs  Lab 03/10/21 1355  AST 49*  ALT 39  ALKPHOS 165*  BILITOT 1.0  PROT 7.7  ALBUMIN 3.8   Coagulation Profile: Recent Labs  Lab 03/10/21 1355  INR 1.0   Urine analysis:    Component Value Date/Time   COLORURINE YELLOW 03/10/2021 1600   APPEARANCEUR CLEAR 03/10/2021 1600   LABSPEC 1.006 03/10/2021 1600   PHURINE 7.0 03/10/2021 1600   GLUCOSEU NEGATIVE 03/10/2021 1600   HGBUR NEGATIVE 03/10/2021 1600   BILIRUBINUR NEGATIVE 03/10/2021 1600   BILIRUBINUR color interference 01/12/2014 1547   KETONESUR NEGATIVE 03/10/2021 1600   PROTEINUR NEGATIVE 03/10/2021 1600   UROBILINOGEN 1.0 06/06/2014 1737   NITRITE NEGATIVE 03/10/2021 1600   LEUKOCYTESUR NEGATIVE 03/10/2021 1600    Radiological Exams on Admission: CT HEAD WO CONTRAST  Result Date: 03/10/2021 CLINICAL DATA:  Bilateral leg weakness  EXAM: CT HEAD WITHOUT CONTRAST TECHNIQUE: Contiguous axial images were obtained from the base of the skull through the vertex without intravenous contrast. COMPARISON:  02/05/2009 FINDINGS: Brain: No evidence of acute infarction, hemorrhage, hydrocephalus, extra-axial collection or mass lesion/mass effect. Vascular: No hyperdense vessel or unexpected calcification. Skull: Normal. Negative  for fracture or focal lesion. Sinuses/Orbits: Partial opacification of the left maxillary sinus. Otherwise clear. Other: None. IMPRESSION: 1. No acute intracranial findings. 2. Partial opacification of the left maxillary sinus. Electronically Signed   By: Davina Poke D.O.   On: 03/10/2021 14:33    EKG: Pending  Assessment/Plan Principal Problem:   Right leg weakness Active Problems:   HTN (hypertension)   History of seizure disorder, since CVA   OSA (obstructive sleep apnea)   Diabetes mellitus (HCC)   Hepatic cirrhosis (HCC)   PUD (peptic ulcer disease)  Lower extremity weakness, exam shows objective right lower extremity weakness relative to left.  No other focal neurologic deficits.  Head CT without acute abnormality - LKW- 7pm yesterday evening. -Evaluated by telemetry neurology, etiology of presentation-hypertensive encephalopathy, new stroke, lumbar spine pathology. -Allow for permissive hypertension to 220/110 -MRA Head and neck -Echocardiogram -Reports compliance with aspirin, 325mg  x 1, continue 81 mg daily -Resume statin,  -Reports prior stroke, [facility] at [facility], ~ 10 yrs ago.  Unable to tell me further details. -Obtain brain MRI without contrast, MRA head and neck -If brain MRI unremarkable, consider lumbar MRI - HgbA1C, lipid panel - Obtain EKG  Substance use disorder-alcohol, tobacco, cocaine.  UDS is positive for cocaine.  Reports he last used cocaine 2 to 3 weeks ago.  Reports smoking less than half a pack of cigarettes weekly.  Reports drinking 3 beers daily. -Nicotine patch  ordered -CIWA protocol as needed -Thiamine folate multivitamins -Check magnesium and phosphorus  Diabetes mellitus-random glucose 121.  Not on medication. - HgbA1c - SSI- S  Hypertension-systolic 836O to 294.  Likely non-adherence.  Reports chronically elevated blood pressure.  Med list has Norvasc, metoprolol, and hydralazine.  Patient reports is taking just 1 medication twice a day. -For permissive hypertension - 220/110. -As needed hydralazine 10 mg  Liver cirrhosis - Stable  Seizure hx - Resume topiramate  DVT prophylaxis: Lovenox Code Status: Full code Family Communication: None at bedside Disposition Plan: ~ 1 -2 days Consults called: Neurology Admission status: Obs tele  Bethena Roys MD [facility]  03/10/2021, 8:04 PM

## 2021-03-11 ENCOUNTER — Observation Stay (HOSPITAL_COMMUNITY): Payer: Medicare Other

## 2021-03-11 ENCOUNTER — Observation Stay (HOSPITAL_BASED_OUTPATIENT_CLINIC_OR_DEPARTMENT_OTHER): Payer: Medicare Other

## 2021-03-11 ENCOUNTER — Encounter (HOSPITAL_COMMUNITY): Payer: Self-pay | Admitting: Internal Medicine

## 2021-03-11 DIAGNOSIS — Z8669 Personal history of other diseases of the nervous system and sense organs: Secondary | ICD-10-CM | POA: Diagnosis not present

## 2021-03-11 DIAGNOSIS — I10 Essential (primary) hypertension: Secondary | ICD-10-CM

## 2021-03-11 DIAGNOSIS — M6281 Muscle weakness (generalized): Secondary | ICD-10-CM | POA: Diagnosis not present

## 2021-03-11 DIAGNOSIS — I6389 Other cerebral infarction: Secondary | ICD-10-CM | POA: Diagnosis not present

## 2021-03-11 DIAGNOSIS — E1169 Type 2 diabetes mellitus with other specified complication: Secondary | ICD-10-CM | POA: Diagnosis not present

## 2021-03-11 DIAGNOSIS — I169 Hypertensive crisis, unspecified: Secondary | ICD-10-CM

## 2021-03-11 DIAGNOSIS — R29898 Other symptoms and signs involving the musculoskeletal system: Secondary | ICD-10-CM | POA: Diagnosis not present

## 2021-03-11 LAB — GLUCOSE, CAPILLARY
Glucose-Capillary: 163 mg/dL — ABNORMAL HIGH (ref 70–99)
Glucose-Capillary: 99 mg/dL (ref 70–99)
Glucose-Capillary: 110 mg/dL — ABNORMAL HIGH (ref 70–99)

## 2021-03-11 LAB — ECHOCARDIOGRAM COMPLETE
AR max vel: 2.49 cm2
AV Area VTI: 2.4 cm2
AV Area mean vel: 2.42 cm2
AV Mean grad: 4 mmHg
AV Peak grad: 7.5 mmHg
Ao pk vel: 1.37 m/s
Area-P 1/2: 3.05 cm2
Calc EF: 51.4 %
Height: 67 in
MV VTI: 2.66 cm2
S' Lateral: 2.7 cm
Single Plane A2C EF: 52.8 %
Single Plane A4C EF: 50.1 %
Weight: 3024 oz

## 2021-03-11 LAB — LIPID PANEL
Cholesterol: 134 mg/dL (ref 0–200)
HDL: 61 mg/dL (ref >40)
LDL Cholesterol: 48 mg/dL (ref 0–99)
Total CHOL/HDL Ratio: 2.2 RATIO
Triglycerides: 126 mg/dL (ref <150)
VLDL: 25 mg/dL (ref 0–40)

## 2021-03-11 LAB — CBG MONITORING, ED: Glucose-Capillary: 119 mg/dL — ABNORMAL HIGH (ref 70–99)

## 2021-03-11 LAB — HIV ANTIBODY (ROUTINE TESTING W REFLEX): HIV Screen 4th Generation wRfx: NONREACTIVE

## 2021-03-11 LAB — HEMOGLOBIN A1C
Hgb A1c MFr Bld: 5.9 % — ABNORMAL HIGH (ref 4.8–5.6)
Mean Plasma Glucose: 122.63 mg/dL

## 2021-03-11 MED ORDER — GADOBUTROL 1 MMOL/ML IV SOLN
10.0000 mL | Freq: Once | INTRAVENOUS | Status: AC | PRN
  Administered 2021-03-11: 10 mL via INTRAVENOUS

## 2021-03-11 MED ORDER — HYDRALAZINE HCL 25 MG PO TABS
25.0000 mg | ORAL_TABLET | Freq: Three times a day (TID) | ORAL | Status: DC
  Administered 2021-03-11 – 2021-03-12 (×4): 25 mg via ORAL
  Filled 2021-03-11 (×4): qty 1

## 2021-03-11 MED ORDER — METOPROLOL SUCCINATE ER 50 MG PO TB24: 50.0000 mg | ORAL_TABLET | Freq: Every day | ORAL | Status: DC

## 2021-03-11 MED ORDER — CYANOCOBALAMIN 1000 MCG/ML IJ SOLN
1000.0000 ug | Freq: Once | INTRAMUSCULAR | Status: AC
  Administered 2021-03-11: 1000 ug via INTRAMUSCULAR
  Filled 2021-03-11: qty 1

## 2021-03-11 MED ORDER — AMLODIPINE BESYLATE 5 MG PO TABS
10.0000 mg | ORAL_TABLET | Freq: Every day | ORAL | Status: DC
  Administered 2021-03-11 – 2021-03-12 (×2): 10 mg via ORAL
  Filled 2021-03-11 (×2): qty 2

## 2021-03-11 MED ORDER — METOPROLOL SUCCINATE ER 50 MG PO TB24
25.0000 mg | ORAL_TABLET | Freq: Every day | ORAL | Status: DC
  Administered 2021-03-11 – 2021-03-12 (×2): 25 mg via ORAL
  Filled 2021-03-11 (×2): qty 1

## 2021-03-11 NOTE — Progress Notes (Signed)
*  PRELIMINARY RESULTS* Echocardiogram 2D Echocardiogram has been performed.  Philip [name] 03/11/2021, 3:48 PM

## 2021-03-11 NOTE — Care Management Obs Status (Signed)
[place] NOTIFICATION   Patient Details  Name: Philip [name] MRN: [id number] Date of Birth: 17-May-1956   Medicare Observation Status Notification Given:  Yes    Tommy Medal 03/11/2021, 3:59 PM

## 2021-03-11 NOTE — ED Notes (Signed)
Patient transported to MRI 

## 2021-03-11 NOTE — Progress Notes (Signed)
PROGRESS NOTE  Philip [name] VXB:[id number] DOB: 11-Feb-1956 DOA: 03/10/2021 PCP: [name], MD  HPI/Recap of past 24 hours: Philip [name] is a 65 y.o. male with medical history significant for prior CVA, alcohol abuse, DM, hepatic cirrhosis, HTN, peptic ulcer disease. Presented to the ED with complaints of weakness in both legs for 1 day. He reports he was out walking, came home sat on his bed, and he was trying to get up and realized that he was barely able to stand or even walk. Has chronic bilateral lower extremity numbness that is unchanged.  No weakness involving his arms or legs.  No voice change no facial asymmetry. In the ED, BP noted to be uncontrolled, other VSS. Head CT without acute abnormality.  UDS positive for cocaine.Teleneurology was consulted, recommended admission for stroke work-up to include MRI brain.     Today, pt still reporting BLE weakness, denies any other new complaints. No other focal deficits noted. Wants to smoke cigarette, states nicotine patch doesn't work.      Assessment/Plan: Principal Problem:   Right leg weakness Active Problems:   HTN (hypertension)   History of seizure disorder, since CVA   OSA (obstructive sleep apnea)   Diabetes mellitus (HCC)   Hepatic cirrhosis (HCC)   PUD (peptic ulcer disease)   Bilateral lower extremity weakness Unknown etiology, ??hypertensive encephalopathy vs lumbar spine pathology No other focal neurologic deficits Head CT without acute abnormality MRI and MRA Head and neck--> no evidence of recent infarction or hemorrhage, no large vessel occlusion, moderate stenosis distal L MCA.  Small incidental left thalamic cavernous malformation Echocardiogram pending Consider MRI lumbar spine Neurology consulted PT/OT Fall precautions  Hypertensive crisis SBP on the 200s on presentation, likely 2/2 non compliant Restart BP meds, norvasc, hydralazine, metoprolol Monitor BP closely  Diabetes mellitus type  2 HgbA1c 5.9, controlled Not on medication. SSI, hypoglycemic protocol  Liver cirrhosis Stable  Seizure Hx Resume topiramate  Polysubstance abuse Abuses alcohol, tobacco, cocaine UDS is positive for cocaine, reports drinking 3 beers daily Nicotine patch CIWA protocol as needed Thiamine folate multivitamins     Estimated body mass index is 29.6 kg/m as calculated from the following:   Height as of this encounter: 5\' 7"  (1.702 m).   Weight as of this encounter: 85.7 kg.     Code Status: Full  Family Communication: None at bedside  Disposition Plan: Status is: Observation  The patient remains OBS appropriate and will d/c before 2 midnights.     Consultants: Neurology  Procedures: None  Antimicrobials: None  DVT prophylaxis: Lovenox   Objective: Vitals:   03/11/21 0738 03/11/21 0800 03/11/21 0927 03/11/21 0953  BP: (!) 175/114 (!) 161/107 (!) 157/91 (!) 157/91  Pulse: 62 (!) 55 77 77  Resp:  12 16 16   Temp:   98.3 F (36.8 C) 98.3 F (36.8 C)  TempSrc:   Oral Oral  SpO2:  97% 100%   Weight:    85.7 kg  Height:    5\' 7"  (1.702 m)    Intake/Output Summary (Last 24 hours) at 03/11/2021 1315 Last data filed at 03/11/2021 1100 Gross per 24 hour  Intake 240 ml  Output --  Net 240 ml   Filed Weights   03/11/21 0953  Weight: 85.7 kg    Exam: General: NAD  Cardiovascular: S1, S2 present Respiratory: CTAB Abdomen: Soft, nontender, nondistended, bowel sounds present Musculoskeletal: No bilateral pedal edema noted Skin: Normal Psychiatry: Normal mood  Neurology: Normal strength in  bilateral upper extremities, 4/5 in RLE, 5/5 LLE, sensation intact, no obvious focal neurologic deficits    Data Reviewed: CBC: Recent Labs  Lab 03/10/21 1355 03/10/21 1431  WBC 6.4  --   NEUTROABS 3.8  --   HGB 17.9* 17.7*  HCT 52.7* 52.0  MCV 93.8  --   PLT 120*  --    Basic Metabolic Panel: Recent Labs  Lab 03/10/21 1355 03/10/21 1431  NA 132* 135   K 4.1 4.2  CL 97* 98  CO2 26  --   GLUCOSE 121* 118*  BUN 5* 3*  CREATININE 1.18 1.10  CALCIUM 8.7*  --   MG 2.5*  --   PHOS 2.3*  --    GFR: Estimated Creatinine Clearance: 70 mL/min (by C-G formula based on SCr of 1.1 mg/dL). Liver Function Tests: Recent Labs  Lab 03/10/21 1355  AST 49*  ALT 39  ALKPHOS 165*  BILITOT 1.0  PROT 7.7  ALBUMIN 3.8   No results for input(s): LIPASE, AMYLASE in the last 168 hours. No results for input(s): AMMONIA in the last 168 hours. Coagulation Profile: Recent Labs  Lab 03/10/21 1355  INR 1.0   Cardiac Enzymes: No results for input(s): CKTOTAL, CKMB, CKMBINDEX, TROPONINI in the last 168 hours. BNP (last 3 results) No results for input(s): PROBNP in the last 8760 hours. HbA1C: Recent Labs    03/11/21 0426  HGBA1C 5.9*   CBG: Recent Labs  Lab 03/10/21 2157 03/11/21 0737 03/11/21 1132  GLUCAP 108* 119* 163*   Lipid Profile: Recent Labs    03/11/21 0426  CHOL 134  HDL 61  LDLCALC 48  TRIG 126  CHOLHDL 2.2   Thyroid Function Tests: No results for input(s): TSH, T4TOTAL, FREET4, T3FREE, THYROIDAB in the last 72 hours. Anemia Panel: No results for input(s): VITAMINB12, FOLATE, FERRITIN, TIBC, IRON, RETICCTPCT in the last 72 hours. Urine analysis:    Component Value Date/Time   COLORURINE YELLOW 03/10/2021 1600   APPEARANCEUR CLEAR 03/10/2021 1600   LABSPEC 1.006 03/10/2021 1600   PHURINE 7.0 03/10/2021 1600   GLUCOSEU NEGATIVE 03/10/2021 1600   HGBUR NEGATIVE 03/10/2021 1600   BILIRUBINUR NEGATIVE 03/10/2021 1600   BILIRUBINUR color interference 01/12/2014 1547   KETONESUR NEGATIVE 03/10/2021 1600   PROTEINUR NEGATIVE 03/10/2021 1600   UROBILINOGEN 1.0 06/06/2014 1737   NITRITE NEGATIVE 03/10/2021 1600   LEUKOCYTESUR NEGATIVE 03/10/2021 1600   Sepsis Labs: @LABRCNTIP (procalcitonin:4,lacticidven:4)  ) Recent Results (from the past 240 hour(s))  Resp Panel by RT-PCR (Flu A&B, Covid) Nasopharyngeal Swab      Status: None   Collection Time: 03/10/21  1:55 PM   Specimen: Nasopharyngeal Swab; Nasopharyngeal(NP) swabs in vial transport medium  Result Value Ref Range Status   SARS Coronavirus 2 by RT PCR NEGATIVE NEGATIVE Final    Comment: (NOTE) SARS-CoV-2 target nucleic acids are NOT DETECTED.  The SARS-CoV-2 RNA is generally detectable in upper respiratory specimens during the acute phase of infection. The lowest concentration of SARS-CoV-2 viral copies this assay can detect is 138 copies/mL. A negative result does not preclude SARS-Cov-2 infection and should not be used as the sole basis for treatment or other patient management decisions. A negative result may occur with  improper specimen collection/handling, submission of specimen other than nasopharyngeal swab, presence of viral mutation(s) within the areas targeted by this assay, and inadequate number of viral copies(<138 copies/mL). A negative result must be combined with clinical observations, patient history, and epidemiological information. The expected result is Negative.  Fact  Sheet for Patients:  EntrepreneurPulse.com.au  Fact Sheet for Healthcare Providers:  IncredibleEmployment.be  This test is no t yet approved or cleared by the [place] FDA and  has been authorized for detection and/or diagnosis of SARS-CoV-2 by FDA under an Emergency Use Authorization (EUA). This EUA will remain  in effect (meaning this test can be used) for the duration of the COVID-19 declaration under Section 564(b)(1) of the Act, 21 U.S.C.section 360bbb-3(b)(1), unless the authorization is terminated  or revoked sooner.       Influenza A by PCR NEGATIVE NEGATIVE Final   Influenza B by PCR NEGATIVE NEGATIVE Final    Comment: (NOTE) The Xpert Xpress SARS-CoV-2/FLU/RSV plus assay is intended as an aid in the diagnosis of influenza from Nasopharyngeal swab specimens and should not be used as a sole basis  for treatment. Nasal washings and aspirates are unacceptable for Xpert Xpress SARS-CoV-2/FLU/RSV testing.  Fact Sheet for Patients: EntrepreneurPulse.com.au  Fact Sheet for Healthcare Providers: IncredibleEmployment.be  This test is not yet approved or cleared by the [place] FDA and has been authorized for detection and/or diagnosis of SARS-CoV-2 by FDA under an Emergency Use Authorization (EUA). This EUA will remain in effect (meaning this test can be used) for the duration of the COVID-19 declaration under Section 564(b)(1) of the Act, 21 U.S.C. section 360bbb-3(b)(1), unless the authorization is terminated or revoked.  Performed at [facility], [address], [place], [place] [address]       Studies: CT HEAD WO CONTRAST  Result Date: 03/10/2021 CLINICAL DATA:  Bilateral leg weakness EXAM: CT HEAD WITHOUT CONTRAST TECHNIQUE: Contiguous axial images were obtained from the base of the skull through the vertex without intravenous contrast. COMPARISON:  02/05/2009 FINDINGS: Brain: No evidence of acute infarction, hemorrhage, hydrocephalus, extra-axial collection or mass lesion/mass effect. Vascular: No hyperdense vessel or unexpected calcification. Skull: Normal. Negative for fracture or focal lesion. Sinuses/Orbits: Partial opacification of the left maxillary sinus. Otherwise clear. Other: None. IMPRESSION: 1. No acute intracranial findings. 2. Partial opacification of the left maxillary sinus. Electronically Signed   By: Davina Poke D.O.   On: 03/10/2021 14:33   MR ANGIO HEAD WO CONTRAST  Result Date: 03/11/2021 CLINICAL DATA:  Right greater than left lower extremity weakness EXAM: MRI HEAD WITHOUT CONTRAST MRA HEAD WITHOUT CONTRAST MRA NECK WITHOUT AND WITH CONTRAST TECHNIQUE: Multiplanar, multi-echo pulse sequences of the brain and surrounding structures were acquired without intravenous contrast. Angiographic images of the Circle of  [place] were acquired using MRA technique without intravenous contrast. Angiographic images of the neck were acquired using MRA technique without and with intravenous contrast. Carotid stenosis measurements (when applicable) are obtained utilizing NASCET criteria, using the distal internal carotid diameter as the denominator. CONTRAST:  6mL GADAVIST GADOBUTROL 1 MMOL/ML IV SOLN COMPARISON:  None FINDINGS: MRI HEAD Motion artifact is present. Brain: There is no acute infarction or intracranial hemorrhage. Small lesion of the left thalamus with blooming on susceptibility and faint hyperdensity on prior CT likely reflects a cavernous malformation. Small chronic infarcts of the pons and right thalamus. Additional patchy T2 hyperintensity in the supratentorial white matter is nonspecific but may reflect mild chronic microvascular ischemic changes. Prominence of ventricles and sulci reflects parenchymal volume loss. There is no mass effect or edema. There is no hydrocephalus or extra-axial fluid collection. Vascular: Major vessel flow voids at the skull base are preserved. Skull and upper cervical spine: Normal marrow signal is preserved. Sinuses/Orbits: Paranasal sinuses are aerated. Orbits are unremarkable. Other: Sella is  unremarkable. Patchy mastoid fluid opacification. T1 and T2 hyperintense lesions of the posterior nasopharyngeal wall likely reflect retention cysts. MRA HEAD Motion artifact is present. Intracranial internal carotid arteries are patent. Middle and anterior cerebral arteries are patent. Moderate stenosis distal left M1 MCA. Intracranial vertebral arteries, basilar artery, posterior cerebral arteries are patent. There is no aneurysm. MRA NECK Great vessel origins are patent. Common, internal, and external carotid arteries are patent. Extracranial vertebral arteries are patent. There is no hemodynamically significant stenosis. IMPRESSION: No evidence of recent infarction or hemorrhage. Small, incidental  left thalamic cavernous malformation. Mild chronic microvascular ischemic changes. Few small chronic infarcts. No large vessel occlusion. No hemodynamically significant stenosis in the neck. Moderate stenosis distal left M1 MCA Electronically Signed   By: Macy Mis M.D.   On: 03/11/2021 09:56   MR ANGIO NECK W WO CONTRAST  Result Date: 03/11/2021 CLINICAL DATA:  Right greater than left lower extremity weakness EXAM: MRI HEAD WITHOUT CONTRAST MRA HEAD WITHOUT CONTRAST MRA NECK WITHOUT AND WITH CONTRAST TECHNIQUE: Multiplanar, multi-echo pulse sequences of the brain and surrounding structures were acquired without intravenous contrast. Angiographic images of the Circle of [place] were acquired using MRA technique without intravenous contrast. Angiographic images of the neck were acquired using MRA technique without and with intravenous contrast. Carotid stenosis measurements (when applicable) are obtained utilizing NASCET criteria, using the distal internal carotid diameter as the denominator. CONTRAST:  41mL GADAVIST GADOBUTROL 1 MMOL/ML IV SOLN COMPARISON:  None FINDINGS: MRI HEAD Motion artifact is present. Brain: There is no acute infarction or intracranial hemorrhage. Small lesion of the left thalamus with blooming on susceptibility and faint hyperdensity on prior CT likely reflects a cavernous malformation. Small chronic infarcts of the pons and right thalamus. Additional patchy T2 hyperintensity in the supratentorial white matter is nonspecific but may reflect mild chronic microvascular ischemic changes. Prominence of ventricles and sulci reflects parenchymal volume loss. There is no mass effect or edema. There is no hydrocephalus or extra-axial fluid collection. Vascular: Major vessel flow voids at the skull base are preserved. Skull and upper cervical spine: Normal marrow signal is preserved. Sinuses/Orbits: Paranasal sinuses are aerated. Orbits are unremarkable. Other: Sella is unremarkable. Patchy  mastoid fluid opacification. T1 and T2 hyperintense lesions of the posterior nasopharyngeal wall likely reflect retention cysts. MRA HEAD Motion artifact is present. Intracranial internal carotid arteries are patent. Middle and anterior cerebral arteries are patent. Moderate stenosis distal left M1 MCA. Intracranial vertebral arteries, basilar artery, posterior cerebral arteries are patent. There is no aneurysm. MRA NECK Great vessel origins are patent. Common, internal, and external carotid arteries are patent. Extracranial vertebral arteries are patent. There is no hemodynamically significant stenosis. IMPRESSION: No evidence of recent infarction or hemorrhage. Small, incidental left thalamic cavernous malformation. Mild chronic microvascular ischemic changes. Few small chronic infarcts. No large vessel occlusion. No hemodynamically significant stenosis in the neck. Moderate stenosis distal left M1 MCA Electronically Signed   By: Macy Mis M.D.   On: 03/11/2021 09:56   MR BRAIN WO CONTRAST  Result Date: 03/11/2021 CLINICAL DATA:  Right greater than left lower extremity weakness EXAM: MRI HEAD WITHOUT CONTRAST MRA HEAD WITHOUT CONTRAST MRA NECK WITHOUT AND WITH CONTRAST TECHNIQUE: Multiplanar, multi-echo pulse sequences of the brain and surrounding structures were acquired without intravenous contrast. Angiographic images of the Circle of [place] were acquired using MRA technique without intravenous contrast. Angiographic images of the neck were acquired using MRA technique without and with intravenous contrast. Carotid stenosis measurements (when applicable) are  obtained utilizing NASCET criteria, using the distal internal carotid diameter as the denominator. CONTRAST:  61mL GADAVIST GADOBUTROL 1 MMOL/ML IV SOLN COMPARISON:  None FINDINGS: MRI HEAD Motion artifact is present. Brain: There is no acute infarction or intracranial hemorrhage. Small lesion of the left thalamus with blooming on susceptibility  and faint hyperdensity on prior CT likely reflects a cavernous malformation. Small chronic infarcts of the pons and right thalamus. Additional patchy T2 hyperintensity in the supratentorial white matter is nonspecific but may reflect mild chronic microvascular ischemic changes. Prominence of ventricles and sulci reflects parenchymal volume loss. There is no mass effect or edema. There is no hydrocephalus or extra-axial fluid collection. Vascular: Major vessel flow voids at the skull base are preserved. Skull and upper cervical spine: Normal marrow signal is preserved. Sinuses/Orbits: Paranasal sinuses are aerated. Orbits are unremarkable. Other: Sella is unremarkable. Patchy mastoid fluid opacification. T1 and T2 hyperintense lesions of the posterior nasopharyngeal wall likely reflect retention cysts. MRA HEAD Motion artifact is present. Intracranial internal carotid arteries are patent. Middle and anterior cerebral arteries are patent. Moderate stenosis distal left M1 MCA. Intracranial vertebral arteries, basilar artery, posterior cerebral arteries are patent. There is no aneurysm. MRA NECK Great vessel origins are patent. Common, internal, and external carotid arteries are patent. Extracranial vertebral arteries are patent. There is no hemodynamically significant stenosis. IMPRESSION: No evidence of recent infarction or hemorrhage. Small, incidental left thalamic cavernous malformation. Mild chronic microvascular ischemic changes. Few small chronic infarcts. No large vessel occlusion. No hemodynamically significant stenosis in the neck. Moderate stenosis distal left M1 MCA Electronically Signed   By: Macy Mis M.D.   On: 03/11/2021 09:56    Scheduled Meds:  aspirin EC  81 mg Oral Daily   atorvastatin  40 mg Oral q1800   enoxaparin (LOVENOX) injection  40 mg Subcutaneous J03P   folic acid  1 mg Oral Daily   insulin aspart  0-5 Units Subcutaneous QHS   insulin aspart  0-9 Units Subcutaneous TID WC    multivitamin with minerals  1 tablet Oral Daily   nicotine  21 mg Transdermal Daily   pantoprazole  40 mg Oral Daily   thiamine  100 mg Oral Daily   Or   thiamine  100 mg Intravenous Daily   topiramate  50 mg Oral QPM    Continuous Infusions:   LOS: 0 days     Alma Friendly, MD [facility]  If 7PM-7AM, please contact night-coverage [email or web address] 03/11/2021, 1:15 PM

## 2021-03-11 NOTE — Consult Note (Signed)
Philip Maria A. Merlene Laughter, MD     [email or web address]          Philip [name] is an 65 y.o. male.   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 Repeat orthostatics are recommended. Consider support stocking thigh-high. Also consider a physical therapy. 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 The patient reports that he was at home laying in bed and on standing he became sees suddenly ataxic with leg weakness. He denies dizziness, focal numbness or weakness. He denies palpitation or chest pain. He did not lose consciousness. He decided to seek medical attention. Upper extremities were asymptomatic. He does not report other associated symptoms such as dysarthria or dysphagia.  He reports that he has been up walking around in the hospital and his balance is a little off but he does not report having leg weakness as he did previously. The review systems otherwise negative.    Lying down orthostatics blood pressure 176/101 heart rate 82; standing blood pressure 150/114 heart rate 85  GENERAL:  He is slightly unkept but is doing okay at this time.  HEENT:  Neck is supple no trauma noted.  ABDOMEN: soft  EXTREMITIES: No edema   BACK:  Normal  SKIN: Normal by inspection.    MENTAL STATUS: Alert and oriented. Speech, language and cognition are generally intact. Judgment and insight normal.   CRANIAL NERVES: Pupils are equal, round and reactive to light and accomodation; extra ocular movements are  full, there is no significant nystagmus; visual fields are full; upper and lower facial muscles are normal in strength and symmetric, there is no flattening of the nasolabial folds; tongue is midline; uvula is midline; shoulder elevation is normal.  MOTOR: Normal tone, bulk and strength; no pronator drift.  COORDINATION: Left finger to nose is normal, right finger to nose is normal, No rest tremor; no intention tremor; no postural tremor; no bradykinesia.  REFLEXES: Deep tendon reflexes are symmetrical and normal. Plantar reflexes are flexor bilaterally.   SENSATION: Normal to light touch, temperature, and pain.  GAIT:  this is wide and slightly unsteady.     Blood pressure (!) 153/107, pulse 80, temperature 98.3 F (36.8 C), temperature source Oral, resp. rate 17, height 5\' 7"  (1.702 m), weight 85.7 kg, SpO2 100 %.  Past Medical History:  Diagnosis Date   Chronic cholecystitis with calculus 02/04/2013   CVA (cerebral vascular accident) 09/16/2012   Diabetes mellitus without complication ([place])    borderline   Erectile dysfunction    Gallstones 01/27/2013   GERD (gastroesophageal reflux disease)    H. pylori infection 09/28/2017   Headache(784.0)    "Headache, I dont think they are migranes"   Hepatitis C    [place], completed 06/2015 (Dr. Linus Salmons) , AUG 2017 SVR   History of kidney stones    history of kidney stones   History of migraine    takes Topamax daily   Hypertension    takes Hydralazine,Maxzide,Clonidine,and Amlodipine daily   Hypertension    also takes Metoprolol daily   Nausea    takes Zofran daily   Seizures ([place])  off Dilantin daily.  last one was when he had a stroke.   Short-term memory loss    due to stroke    Sleep apnea    CPAP- tries to do it everyday.   Stroke ([place])    .  Short term memory loss.; left sided weakness   Tobacco user    Umbilical hernia 7/67/3419    Past Surgical History:  Procedure Laterality Date   BIOPSY  03/10/2017   Procedure:  BIOPSY;  Surgeon: Danie Binder, MD;  [facility];  Service: Endoscopy;;  gastric biopsy    CHOLECYSTECTOMY N/A 03/09/2013   Procedure: LAPAROSCOPIC CHOLECYSTECTOMY WITH INTRAOPERATIVE CHOLANGIOGRAM;  Surgeon: Imogene Burn. Georgette Dover, MD;  Location: [place];  Service: General;  Laterality: N/A;   COLONOSCOPY  06/2014   Dr. Olevia Perches: Three sessile polyps were found in the rectum x2 and descending. tubular adenomas. colonoscopy in 06/2019   ESOPHAGOGASTRODUODENOSCOPY (EGD) WITH PROPOFOL N/A 08/21/2015   Dr. Oneida Alar: normal esophagus, non-bleeding gastric ulcers/gastritis (H.pylori)    ESOPHAGOGASTRODUODENOSCOPY (EGD) WITH PROPOFOL N/A 03/10/2017   Procedure: ESOPHAGOGASTRODUODENOSCOPY (EGD) WITH PROPOFOL;  Surgeon: Danie Binder, MD;  [facility];  Service: Endoscopy;  Laterality: N/A;  1:00pm-rescheduled to 10\16 @ 3:79KW    UMBILICAL HERNIA REPAIR N/A 03/09/2013   Procedure: HERNIA REPAIR UMBILICAL ADULT;  Surgeon: Imogene Burn. Georgette Dover, MD;  Location: [place] [facility];  Service: General;  Laterality: N/A;    Family History  Problem Relation Age of Onset   Heart disease Maternal Grandmother    Heart disease Maternal Grandfather    Heart disease Paternal Grandmother    Heart disease Paternal Grandfather    Cancer Cousin    Hypertension Mother    Colon cancer Maternal Uncle    Rectal cancer Neg Hx    Stomach cancer Neg Hx     Social History:  reports that he has been smoking cigarettes. He started smoking about 42 years ago. He has a 5.00 pack-year smoking history. He has never used smokeless tobacco. He reports current alcohol use of about 3.0 standard drinks per week. He reports that he does not use drugs.  Allergies: No Known Allergies  Medications: Prior to Admission medications   Medication Sig Start Date End Date Taking? Authorizing Provider  amLODipine (NORVASC) 10 MG tablet Take 1 tablet (10 mg total) by mouth daily. (NEEDS TO BE SEEN BEFORE NEXT REFILL) 10/01/20  Yes [name], MD  aspirin EC 81 MG tablet Take 81 mg by mouth daily.   Yes [provider]  atorvastatin (LIPITOR) 40 MG tablet Take 1 tablet (40 mg total) by mouth daily. For cholesterol 01/09/20  Yes Stacks, Cletus Gash, MD  esomeprazole (NEXIUM) 40 MG capsule Take 1 capsule (40 mg total) by mouth daily. (NEEDS TO BE SEEN BEFORE NEXT REFILL) 10/01/20  Yes Stacks, Cletus Gash, MD  gabapentin (NEURONTIN) 300 MG capsule Take 1 capsule (300 mg total) by mouth at bedtime. (NEEDS TO BE SEEN BEFORE NEXT REFILL) 10/01/20  Yes Stacks, Cletus Gash, MD  hydrALAZINE (APRESOLINE) 25 MG tablet TAKE 1 TABLET BY MOUTH THREE TIMES DAILY (NEEDS TO BE SEEN BEFORE NEXT REFILL) 10/01/20  Yes Stacks, Cletus Gash, MD  metoprolol succinate (TOPROL-XL) 50 MG 24 hr tablet TAKE 1 TABLET BY MOUTH ONCE DAILY. TAKE WITH OR IMMEDIATELY FOLLOWING A MEAL (NEEDS TO BE SEEN BEFORE NEXT REFILL) 10/01/20  Yes Stacks, Cletus Gash, MD  sildenafil (REVATIO) 20 MG tablet TAKE 2 TO 5 TABLETS BY MOUTH ONCE DAILY AS NEEDED FOR ERECTILE DYSFUNCTION Patient taking  differently: Take 40-100 mg by mouth daily as needed. TAKE 2 TO 5 TABLETS BY MOUTH ONCE DAILY AS NEEDED FOR ERECTILE DYSFUNCTION 12/06/20  Yes [name], MD  topiramate (TOPAMAX) 50 MG tablet Take 1 tablet (50 mg total) by mouth 2 (two) times daily. Patient taking differently: Take 50 mg by mouth every evening. 01/09/20  Yes Stacks, Cletus Gash, MD  amLODipine (NORVASC) 10 MG tablet Take 1 tablet (10 mg total) by mouth daily. (NEEDS TO BE SEEN BEFORE NEXT REFILL) Patient not taking: Reported on 03/10/2021 10/01/20   [name], MD    Scheduled Meds:  amLODipine  10 mg Oral Daily   aspirin EC  81 mg Oral Daily   atorvastatin  40 mg Oral q1800   enoxaparin (LOVENOX) injection  40 mg Subcutaneous X91Y   folic acid  1 mg Oral Daily   hydrALAZINE  25 mg Oral Q8H   insulin aspart  0-5 Units Subcutaneous QHS   insulin aspart  0-9 Units Subcutaneous TID WC   metoprolol succinate  25 mg Oral Daily   multivitamin  with minerals  1 tablet Oral Daily   nicotine  21 mg Transdermal Daily   pantoprazole  40 mg Oral Daily   thiamine  100 mg Oral Daily   Or   thiamine  100 mg Intravenous Daily   topiramate  50 mg Oral QPM   Continuous Infusions: PRN Meds:.acetaminophen **OR** acetaminophen, hydrALAZINE, LORazepam **OR** LORazepam, ondansetron **OR** ondansetron (ZOFRAN) IV, polyethylene glycol     Results for orders placed or performed during the hospital encounter of 03/10/21 (from the past 48 hour(s))  Resp Panel by RT-PCR (Flu A&B, Covid) Nasopharyngeal Swab     Status: None   Collection Time: 03/10/21  1:55 PM   Specimen: Nasopharyngeal Swab; Nasopharyngeal(NP) swabs in vial transport medium  Result Value Ref Range   SARS Coronavirus 2 by RT PCR NEGATIVE NEGATIVE    Comment: (NOTE) SARS-CoV-2 target nucleic acids are NOT DETECTED.  The SARS-CoV-2 RNA is generally detectable in upper respiratory specimens during the acute phase of infection. The lowest concentration of SARS-CoV-2 viral copies this assay can detect is 138 copies/mL. A negative result does not preclude SARS-Cov-2 infection and should not be used as the sole basis for treatment or other patient management decisions. A negative result may occur with  improper specimen collection/handling, submission of specimen other than nasopharyngeal swab, presence of viral mutation(s) within the areas targeted by this assay, and inadequate number of viral copies(<138 copies/mL). A negative result must be combined with clinical observations, patient history, and epidemiological information. The expected result is Negative.  Fact Sheet for Patients:  EntrepreneurPulse.com.au  Fact Sheet for Healthcare Providers:  IncredibleEmployment.be  This test is no t yet approved or cleared by the [place] FDA and  has been authorized for detection and/or diagnosis of SARS-CoV-2 by FDA under an Emergency Use  Authorization (EUA). This EUA will remain  in effect (meaning this test can be used) for the duration of the COVID-19 declaration under Section 564(b)(1) of the Act, 21 U.S.C.section 360bbb-3(b)(1), unless the authorization is terminated  or revoked sooner.       Influenza A by PCR NEGATIVE NEGATIVE   Influenza B by PCR NEGATIVE NEGATIVE    Comment: (NOTE) The Xpert Xpress SARS-CoV-2/FLU/RSV plus assay is intended as an aid in the diagnosis of influenza from Nasopharyngeal swab specimens and should not be used as a sole basis for treatment. Nasal washings and aspirates are unacceptable for Xpert Xpress SARS-CoV-2/FLU/RSV  testing.  Fact Sheet for Patients: EntrepreneurPulse.com.au  Fact Sheet for Healthcare Providers: IncredibleEmployment.be  This test is not yet approved or cleared by the [place] FDA and has been authorized for detection and/or diagnosis of SARS-CoV-2 by FDA under an Emergency Use Authorization (EUA). This EUA will remain in effect (meaning this test can be used) for the duration of the COVID-19 declaration under Section 564(b)(1) of the Act, 21 U.S.C. section 360bbb-3(b)(1), unless the authorization is terminated or revoked.  Performed at [facility], [address] [place], [place] [address]   Ethanol     Status: None   Collection Time: 03/10/21  1:55 PM  Result Value Ref Range   Alcohol, Ethyl (B) <10 <10 mg/dL    Comment: (NOTE) Lowest detectable limit for serum alcohol is 10 mg/dL.  For medical purposes only. Performed at [facility], [address], [place], [place] [address]   Protime-INR     Status: None   Collection Time: 03/10/21  1:55 PM  Result Value Ref Range   Prothrombin Time 13.2 11.4 - 15.2 seconds   INR 1.0 0.8 - 1.2    Comment: (NOTE) INR goal varies based on device and disease states. Performed at [facility], [address] [place], [place] [address]   APTT     Status: None    Collection Time: 03/10/21  1:55 PM  Result Value Ref Range   aPTT 24 24 - 36 seconds    Comment: Performed at [facility], [address] [place], [place] [address]  CBC     Status: Abnormal   Collection Time: 03/10/21  1:55 PM  Result Value Ref Range   WBC 6.4 4.0 - 10.5 K/uL   RBC 5.62 4.22 - 5.81 MIL/uL   Hemoglobin 17.9 (H) 13.0 - 17.0 g/dL   HCT 52.7 (H) 39.0 - 52.0 %   MCV 93.8 80.0 - 100.0 fL   MCH 31.9 26.0 - 34.0 pg   MCHC 34.0 30.0 - 36.0 g/dL   RDW 13.9 11.5 - 15.5 %   Platelets 120 (L) 150 - 400 K/uL    Comment: Immature Platelet Fraction may be clinically indicated, consider ordering this additional test [id number]    nRBC 0.0 0.0 - 0.2 %    Comment: Performed at [facility], [address] [place], [place] [address]  Differential     Status: None   Collection Time: 03/10/21  1:55 PM  Result Value Ref Range   Neutrophils Relative % 58 %   Neutro Abs 3.8 1.7 - 7.7 K/uL   Lymphocytes Relative 23 %   Lymphs Abs 1.5 0.7 - 4.0 K/uL   Monocytes Relative 11 %   Monocytes Absolute 0.7 0.1 - 1.0 K/uL   Eosinophils Relative 6 %   Eosinophils Absolute 0.4 0.0 - 0.5 K/uL   Basophils Relative 1 %   Basophils Absolute 0.1 0.0 - 0.1 K/uL   Immature Granulocytes 1 %   Abs Immature Granulocytes 0.04 0.00 - 0.07 K/uL    Comment: Performed at [facility], [address] [place], [place] [address]  Comprehensive metabolic panel     Status: Abnormal   Collection Time: 03/10/21  1:55 PM  Result Value Ref Range   Sodium 132 (L) 135 - 145 mmol/L   Potassium 4.1 3.5 - 5.1 mmol/L   Chloride 97 (L) 98 - 111 mmol/L   CO2 26 22 - 32 mmol/L   Glucose, Bld 121 (H) 70 - 99 mg/dL    Comment: Glucose  reference range applies only to samples taken after fasting for at least 8 hours.   BUN 5 (L) 8 - 23 mg/dL   Creatinine, Ser 1.18 0.61 - 1.24 mg/dL   Calcium 8.7 (L) 8.9 - 10.3 mg/dL   Total Protein 7.7 6.5 - 8.1 g/dL   Albumin 3.8 3.5 - 5.0 g/dL   AST 49 (H) 15 - 41 U/L   ALT 39 0 - 44  U/L   Alkaline Phosphatase 165 (H) 38 - 126 U/L   Total Bilirubin 1.0 0.3 - 1.2 mg/dL   GFR, Estimated >60 >60 mL/min    Comment: (NOTE) Calculated using the CKD-EPI Creatinine Equation (2021)    Anion gap 9 5 - 15    Comment: Performed at [facility], [address] [place], [place] [address]  Magnesium     Status: Abnormal   Collection Time: 03/10/21  1:55 PM  Result Value Ref Range   Magnesium 2.5 (H) 1.7 - 2.4 mg/dL    Comment: Performed at [facility], [address], [place], [place] [address]  Phosphorus     Status: Abnormal   Collection Time: 03/10/21  1:55 PM  Result Value Ref Range   Phosphorus 2.3 (L) 2.5 - 4.6 mg/dL    Comment: Performed at [facility], [address] [place], [place] [address]  I-stat chem 8, ED     Status: Abnormal   Collection Time: 03/10/21  2:31 PM  Result Value Ref Range   Sodium 135 135 - 145 mmol/L   Potassium 4.2 3.5 - 5.1 mmol/L   Chloride 98 98 - 111 mmol/L   BUN 3 (L) 8 - 23 mg/dL   Creatinine, Ser 1.10 0.61 - 1.24 mg/dL   Glucose, Bld 118 (H) 70 - 99 mg/dL    Comment: Glucose reference range applies only to samples taken after fasting for at least 8 hours.   Calcium, Ion 1.05 (L) 1.15 - 1.40 mmol/L   TCO2 27 22 - 32 mmol/L   Hemoglobin 17.7 (H) 13.0 - 17.0 g/dL   HCT 52.0 39.0 - 52.0 %  Urine rapid drug screen (hosp performed)     Status: Abnormal   Collection Time: 03/10/21  4:00 PM  Result Value Ref Range   Opiates NONE DETECTED NONE DETECTED   Cocaine POSITIVE (A) NONE DETECTED   Benzodiazepines NONE DETECTED NONE DETECTED   Amphetamines NONE DETECTED NONE DETECTED   Tetrahydrocannabinol NONE DETECTED NONE DETECTED   Barbiturates NONE DETECTED NONE DETECTED    Comment: (NOTE) DRUG SCREEN FOR MEDICAL PURPOSES ONLY.  IF CONFIRMATION IS NEEDED FOR ANY PURPOSE, NOTIFY LAB WITHIN 5 DAYS.  LOWEST DETECTABLE LIMITS FOR URINE DRUG SCREEN Drug Class                     Cutoff (ng/mL) Amphetamine and metabolites     1000 Barbiturate and metabolites    200 Benzodiazepine                 914 Tricyclics and metabolites     300 Opiates and metabolites        300 Cocaine and metabolites        300 THC                            50 Performed at [facility], [address], [place], [place] [address]   Urinalysis, Routine w reflex microscopic     Status: None  Collection Time: 03/10/21  4:00 PM  Result Value Ref Range   Color, Urine YELLOW YELLOW   APPearance CLEAR CLEAR   Specific Gravity, Urine 1.006 1.005 - 1.030   pH 7.0 5.0 - 8.0   Glucose, UA NEGATIVE NEGATIVE mg/dL   Hgb urine dipstick NEGATIVE NEGATIVE   Bilirubin Urine NEGATIVE NEGATIVE   Ketones, ur NEGATIVE NEGATIVE mg/dL   Protein, ur NEGATIVE NEGATIVE mg/dL   Nitrite NEGATIVE NEGATIVE   Leukocytes,Ua NEGATIVE NEGATIVE    Comment: Performed at [facility], [address], [place], [place] [address]  CBG monitoring, ED     Status: Abnormal   Collection Time: 03/10/21  9:57 PM  Result Value Ref Range   Glucose-Capillary 108 (H) 70 - 99 mg/dL    Comment: Glucose reference range applies only to samples taken after fasting for at least 8 hours.  Hemoglobin A1c     Status: Abnormal   Collection Time: 03/11/21  4:26 AM  Result Value Ref Range   Hgb A1c MFr Bld 5.9 (H) 4.8 - 5.6 %    Comment: (NOTE) Pre diabetes:          5.7%-6.4%  Diabetes:              >6.4%  Glycemic control for   <7.0% adults with diabetes    Mean Plasma Glucose 122.63 mg/dL    Comment: Performed at [facility] [address] [place], [place] [address]  Lipid panel     Status: None   Collection Time: 03/11/21  4:26 AM  Result Value Ref Range   Cholesterol 134 0 - 200 mg/dL   Triglycerides 126 <150 mg/dL   HDL 61 >40 mg/dL   Total CHOL/HDL Ratio 2.2 RATIO   VLDL 25 0 - 40 mg/dL   LDL Cholesterol 48 0 - 99 mg/dL    Comment:        Total Cholesterol/HDL:CHD Risk Coronary Heart Disease Risk Table                     Men   Women  1/2 Average Risk    3.4   3.3  Average Risk       5.0   4.4  2 X Average Risk   9.6   7.1  3 X Average Risk  23.4   11.0        Use the calculated Patient Ratio above and the CHD Risk Table to determine the patient's CHD Risk.        ATP III CLASSIFICATION (LDL):  <100     mg/dL   Optimal  100-129  mg/dL   Near or Above                    Optimal  130-159  mg/dL   Borderline  160-189  mg/dL   High  >190     mg/dL   Very High Performed at [facility], [address] [place], [place] [address]   HIV Antibody (routine testing w rflx)     Status: None   Collection Time: 03/11/21  4:26 AM  Result Value Ref Range   HIV Screen 4th Generation wRfx Non Reactive Non Reactive    Comment: Performed at [facility], [place] [address], [place], [place] [address]  CBG monitoring, ED     Status: Abnormal   Collection Time: 03/11/21  7:37 AM  Result Value Ref Range   Glucose-Capillary 119 (H) 70 - 99  mg/dL    Comment: Glucose reference range applies only to samples taken after fasting for at least 8 hours.  Glucose, capillary     Status: Abnormal   Collection Time: 03/11/21 11:32 AM  Result Value Ref Range   Glucose-Capillary 163 (H) 70 - 99 mg/dL    Comment: Glucose reference range applies only to samples taken after fasting for at least 8 hours.  Glucose, capillary     Status: None   Collection Time: 03/11/21  3:21 PM  Result Value Ref Range   Glucose-Capillary 99 70 - 99 mg/dL    Comment: Glucose reference range applies only to samples taken after fasting for at least 8 hours.  Glucose, capillary     Status: Abnormal   Collection Time: 03/11/21  4:28 PM  Result Value Ref Range   Glucose-Capillary 110 (H) 70 - 99 mg/dL    Comment: Glucose reference range applies only to samples taken after fasting for at least 8 hours.    Studies/Results:    FINDINGS: MRI HEAD   Motion artifact is present.   Brain: There is no acute infarction or intracranial hemorrhage.   Small lesion of the left thalamus  with blooming on susceptibility and faint hyperdensity on prior CT likely reflects a cavernous malformation. Small chronic infarcts of the pons and right thalamus. Additional patchy T2 hyperintensity in the supratentorial white matter is nonspecific but may reflect mild chronic microvascular ischemic changes. Prominence of ventricles and sulci reflects parenchymal volume loss.   There is no mass effect or edema. There is no hydrocephalus or extra-axial fluid collection.   Vascular: Major vessel flow voids at the skull base are preserved.   Skull and upper cervical spine: Normal marrow signal is preserved.   Sinuses/Orbits: Paranasal sinuses are aerated. Orbits are unremarkable.   Other: Sella is unremarkable. Patchy mastoid fluid opacification. T1 and T2 hyperintense lesions of the posterior nasopharyngeal wall likely reflect retention cysts.   MRA HEAD   Motion artifact is present. Intracranial internal carotid arteries are patent. Middle and anterior cerebral arteries are patent. Moderate stenosis distal left M1 MCA. Intracranial vertebral arteries, basilar artery, posterior cerebral arteries are patent. There is no aneurysm.   MRA NECK   Great vessel origins are patent. Common, internal, and external carotid arteries are patent. Extracranial vertebral arteries are patent. There is no hemodynamically significant stenosis.   IMPRESSION: No evidence of recent infarction or hemorrhage.   Small, incidental left thalamic cavernous malformation.   Mild chronic microvascular ischemic changes. Few small chronic infarcts.   No large vessel occlusion. No hemodynamically significant stenosis in the neck. Moderate stenosis distal left M1 MCA    The brain imaging is reviewed. The MRI shows no acute changes. There is  circular increased signal involving the thalamic region on left side seen on DWI and FLAIR with the areas of susceptibility cavernoma.  There is moderate to severe  global atrophy for age. There is mild white matter signal seen in the pontine region and periventricular areas with small areas of encephalomalacia involving the pontine and bilateral thalamic region suggestive of small acute infarcts remote.   Intracranial MRA is mostly unremarkable.      Lugene Beougher A. Merlene [name], M.D.  Diplomate, Tax adviser of Psychiatry and Neurology ( Neurology). 03/11/2021, 5:11 PM

## 2021-03-12 DIAGNOSIS — K7030 Alcoholic cirrhosis of liver without ascites: Secondary | ICD-10-CM

## 2021-03-12 DIAGNOSIS — M6281 Muscle weakness (generalized): Secondary | ICD-10-CM | POA: Diagnosis not present

## 2021-03-12 DIAGNOSIS — E1169 Type 2 diabetes mellitus with other specified complication: Secondary | ICD-10-CM | POA: Diagnosis not present

## 2021-03-12 DIAGNOSIS — Z8669 Personal history of other diseases of the nervous system and sense organs: Secondary | ICD-10-CM | POA: Diagnosis not present

## 2021-03-12 DIAGNOSIS — R29898 Other symptoms and signs involving the musculoskeletal system: Secondary | ICD-10-CM | POA: Diagnosis not present

## 2021-03-12 LAB — CBC WITH DIFFERENTIAL/PLATELET
Abs Immature Granulocytes: 0.03 K/uL (ref 0.00–0.07)
Basophils Absolute: 0.1 K/uL (ref 0.0–0.1)
Basophils Relative: 1 %
Eosinophils Absolute: 0.5 K/uL (ref 0.0–0.5)
Eosinophils Relative: 7 %
HCT: 46 % (ref 39.0–52.0)
Hemoglobin: 15.4 g/dL (ref 13.0–17.0)
Immature Granulocytes: 0 %
Lymphocytes Relative: 25 %
Lymphs Abs: 1.7 K/uL (ref 0.7–4.0)
MCH: 31.8 pg (ref 26.0–34.0)
MCHC: 33.5 g/dL (ref 30.0–36.0)
MCV: 94.8 fL (ref 80.0–100.0)
Monocytes Absolute: 0.7 K/uL (ref 0.1–1.0)
Monocytes Relative: 11 %
Neutro Abs: 3.9 K/uL (ref 1.7–7.7)
Neutrophils Relative %: 56 %
Platelets: 97 K/uL — ABNORMAL LOW (ref 150–400)
RBC: 4.85 MIL/uL (ref 4.22–5.81)
RDW: 13.8 % (ref 11.5–15.5)
WBC: 6.9 K/uL (ref 4.0–10.5)
nRBC: 0 % (ref 0.0–0.2)

## 2021-03-12 LAB — BASIC METABOLIC PANEL
Anion gap: 7 (ref 5–15)
BUN: 11 mg/dL (ref 8–23)
CO2: 26 mmol/L (ref 22–32)
Calcium: 8.4 mg/dL — ABNORMAL LOW (ref 8.9–10.3)
Chloride: 102 mmol/L (ref 98–111)
Creatinine, Ser: 1.24 mg/dL (ref 0.61–1.24)
GFR, Estimated: >60 mL/min (ref >60)
Glucose, Bld: 114 mg/dL — ABNORMAL HIGH (ref 70–99)
Potassium: 3.7 mmol/L (ref 3.5–5.1)
Sodium: 135 mmol/L (ref 135–145)

## 2021-03-12 LAB — GLUCOSE, CAPILLARY
Glucose-Capillary: 134 mg/dL — ABNORMAL HIGH (ref 70–99)
Glucose-Capillary: 129 mg/dL — ABNORMAL HIGH (ref 70–99)
Glucose-Capillary: 115 mg/dL — ABNORMAL HIGH (ref 70–99)

## 2021-03-12 MED ORDER — AMLODIPINE BESYLATE 10 MG PO TABS: 10.0000 mg | ORAL_TABLET | Freq: Every day | ORAL | 0 refills | Status: DC

## 2021-03-12 MED ORDER — ADULT MULTIVITAMIN W/MINERALS CH: 1.0000 | ORAL_TABLET | Freq: Every day | ORAL | Status: DC

## 2021-03-12 MED ORDER — METOPROLOL SUCCINATE ER 50 MG PO TB24: ORAL_TABLET | ORAL | 0 refills | Status: DC

## 2021-03-12 MED ORDER — HYDRALAZINE HCL 25 MG PO TABS
ORAL_TABLET | ORAL | 0 refills | Status: DC
Start: 2021-03-12 — End: 2021-04-29

## 2021-03-12 MED ORDER — THIAMINE HCL 100 MG PO TABS: 100.0000 mg | ORAL_TABLET | Freq: Every day | ORAL | 0 refills | Status: AC

## 2021-03-12 MED ORDER — CLOPIDOGREL BISULFATE 75 MG PO TABS: 75.0000 mg | ORAL_TABLET | Freq: Every day | ORAL | 0 refills | Status: AC

## 2021-03-12 MED ORDER — FOLIC ACID 1 MG PO TABS: 1.0000 mg | ORAL_TABLET | Freq: Every day | ORAL | 0 refills | Status: AC

## 2021-03-12 MED ORDER — PANTOPRAZOLE SODIUM 40 MG PO TBEC: 40.0000 mg | DELAYED_RELEASE_TABLET | Freq: Every day | ORAL | 0 refills | Status: DC

## 2021-03-12 NOTE — Plan of Care (Signed)
  Problem: Acute Rehab PT Goals(only PT should resolve) Goal: Patient Will Transfer Sit To/From Stand Outcome: Progressing Flowsheets (Taken 03/12/2021 1046) Patient will transfer sit to/from stand: with modified independence Goal: Pt Will Ambulate Outcome: Progressing Flowsheets (Taken 03/12/2021 1046) Pt will Ambulate:  > 125 feet  with modified independence  with least restrictive assistive device   Tori Kennidi Yoshida PT, DPT 03/12/21, 10:47 AM

## 2021-03-12 NOTE — Evaluation (Signed)
Occupational Therapy Evaluation Patient Details Name: Philip [name] MRN: [id number] DOB: 1955/11/19 Today's Date: 03/12/2021   History of Present Illness Philip [name] is a 65 y.o. male with medical history significant for prior CVA, alcohol abuse, diabetes mellitus, hepatic cirrhosis, hypertension, peptic ulcer disease.  Presented to the ED with complaints of weakness in both legs that started at about 7 PM yesterday.  He reports he was out walking yesterday, came home sat on his bed, and he was trying to get up and realized that he was barely able to stand or even walk.  Has chronic bilateral lower extremity numbness that is unchanged.  No weakness involving his arms or legs.  No voice change no facial asymmetry.  Today his symptoms are the same as it was yesterday   Clinical Impression   Pt agreeable to OT/PT co-evaluation. Pt demonstrates WFL B UE strength and coordination. Pt was able to ambulate in hall with SPV to mod I level of assist using RW. Pt demonstrated quick, impulsive movement during ambulation in hall. Pt is able to complete doffing and donning of a sock without difficulty. Pt left in bed with call bell within reach. Pt is not recommended for further acute OT services and will be discharged to care of nursing staff for the remaining length of stay.      Recommendations for follow up therapy are one component of a multi-disciplinary discharge planning process, led by the attending physician.  Recommendations may be updated based on patient status, additional functional criteria and insurance authorization.   Follow Up Recommendations  No OT follow up    Equipment Recommendations  None recommended by OT           Precautions / Restrictions Precautions Precautions: Fall Restrictions Weight Bearing Restrictions: No      Mobility Bed Mobility Overal bed mobility: Modified Independent             General bed mobility comments: mild labored movement.     Transfers Overall transfer level: Needs assistance Equipment used: Rolling walker (2 wheeled) Transfers: Sit to/from [facility] Sit to Stand: Supervision;Modified independent (Device/Increase time) Stand pivot transfers: Supervision;Modified independent (Device/Increase time)       General transfer comment: Mildly unsteady with quick pace during mobility in hall prior to transfer to bed. Impulsive during mobility.    Balance Overall balance assessment: Needs assistance Sitting-balance support: No upper extremity supported;Feet supported Sitting balance-Leahy Scale: Good     Standing balance support: Bilateral upper extremity supported;During functional activity Standing balance-Leahy Scale: Fair Standing balance comment: fair to good with RW                           ADL either performed or assessed with clinical judgement   ADL Overall ADL's : Needs assistance/impaired                     Lower Body Dressing: Independent;Sitting/lateral leans   Toilet Transfer: Supervision/safety;Ambulation;RW;Modified Independent Toilet Transfer Details (indicate cue type and reason): Partially simulated via ambulatory transfer from EOB to walking in hall and back to EOB with RW.         Functional mobility during ADLs: Supervision/safety;Rolling walker;Modified independent       Vision Baseline Vision/History: 1 Wears glasses (reading) Ability to See in Adequate Light: 0 Adequate Patient Visual Report: No change from baseline Vision Assessment?: No apparent visual deficits  Pertinent Vitals/Pain Pain Assessment: No/denies pain     Hand Dominance Right   Extremity/Trunk Assessment Upper Extremity Assessment Upper Extremity Assessment: Overall WFL for tasks assessed   Lower Extremity Assessment Lower Extremity Assessment: Defer to PT evaluation   Cervical / Trunk Assessment Cervical / Trunk Assessment: Normal    Communication Communication Communication: No difficulties   Cognition Arousal/Alertness: Awake/alert Behavior During Therapy: WFL for tasks assessed/performed Overall Cognitive Status: Within Functional Limits for tasks assessed                                                Home Living Family/patient expects to be discharged to:: Private residence Living Arrangements: Alone Available Help at Discharge: Family;Available PRN/intermittently Type of Home: Apartment Home Access: Level entry     Home Layout: One level     Bathroom Shower/Tub: Occupational psychologist: Standard Bathroom Accessibility: Yes   Home Equipment: Grab bars - tub/shower;Cane - single point   Additional Comments: Pt reports "I'm moving in this week"      Prior Functioning/Environment Level of Independence: Independent        Comments: Pt reports independent with community ambulation, ADLs/IADLs, can drive.                 OT Goals(Current goals can be found in the care plan section) Acute Rehab OT Goals Patient Stated Goal: return home                   Co-evaluation PT/OT/SLP Co-Evaluation/Treatment: Yes Reason for Co-Treatment: To address functional/ADL transfers   OT goals addressed during session: ADL's and self-care      AM-PAC OT "6 Clicks" Daily Activity     Outcome Measure Help from another person eating meals?: None Help from another person taking care of personal grooming?: None Help from another person toileting, which includes using toliet, bedpan, or urinal?: None Help from another person bathing (including washing, rinsing, drying)?: None Help from another person to put on and taking off regular upper body clothing?: None Help from another person to put on and taking off regular lower body clothing?: None 6 Click Score: 24   End of Session Equipment Utilized During Treatment: Rolling walker  Activity Tolerance: Patient tolerated  treatment well Patient left: in bed;with call bell/phone within reach  OT Visit Diagnosis: Unsteadiness on feet (R26.81);Other abnormalities of gait and mobility (R26.89)                Time: 9563-8756 OT Time Calculation (min): 14 min Charges:  OT General Charges $OT Visit: 1 Visit OT Evaluation $OT Eval Low Complexity: 1 Low  Jamayah Myszka OT, MOT  Larey Seat 03/12/2021, 9:58 AM

## 2021-03-12 NOTE — TOC Initial Note (Signed)
Transition of Care [facility]) - Initial/Assessment Note    Patient Details  Name: Philip [name] MRN: [id number] Date of Birth: 10-May-1956  [facility] CM/SW Contact:    Salome Arnt, [place] Phone Number: 03/12/2021, 8:55 AM  Clinical Narrative:  Pt admitted due to right leg weakness. Pt reports he lives alone and is independent with ADLs. His sisters provide transportation to appointments. He plans to return home when medically stable. TOC consulted for substance use. Pt reports he drinks a beer "every now and then." He denies any drug use. However, pt was positive for cocaine. Pt states he was "staying with some girls and they were using and I was around it so it must have gotten in my system." He does not feel his substance use is a problem and does not want resources. TOC will continue to follow.                  Expected Discharge Plan: Home/Self Care Barriers to Discharge: Continued Medical Work up   Patient Goals and CMS Choice Patient states their goals for this hospitalization and ongoing recovery are:: return home   Choice offered to / list presented to : Patient  Expected Discharge Plan and Services Expected Discharge Plan: Home/Self Care In-house Referral: Clinical Social Work                       DME Arranged: N/A                    Prior Living Arrangements/Services   Lives with:: Self Patient language and need for interpreter reviewed:: Yes Do you feel safe going back to the place where you live?: Yes      Need for Family Participation in Patient Care: No (Comment)     Criminal Activity/Legal Involvement Pertinent to Current Situation/Hospitalization: No - Comment as needed  Activities of Daily Living Home Assistive Devices/Equipment: None ADL Screening (condition at time of admission) Patient's cognitive ability adequate to safely complete daily activities?: Yes Is the patient deaf or have difficulty hearing?: No Does the  patient have difficulty seeing, even when wearing glasses/contacts?: No Does the patient have difficulty concentrating, remembering, or making decisions?: No Patient able to express need for assistance with ADLs?: Yes Does the patient have difficulty dressing or bathing?: No Independently performs ADLs?: Yes (appropriate for developmental age) Does the patient have difficulty walking or climbing stairs?: No Weakness of Legs: Both Weakness of Arms/Hands: None  Permission Sought/Granted                  Emotional Assessment     Affect (typically observed): Accepting Orientation: : Oriented to Self, Oriented to Place, Oriented to  Time, Oriented to Situation Alcohol / Substance Use: Not Applicable Psych Involvement: No (comment)  Admission diagnosis:  Right leg weakness [R29.898] Cerebrovascular accident (CVA), unspecified mechanism ([place]) [I63.9] Patient Active Problem List   Diagnosis Date Noted   Right leg weakness 03/10/2021   PUD (peptic ulcer disease) 03/11/2016   Alcohol abuse, 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   Hepatic cirrhosis ([place]) 06/28/2015   Diabetes mellitus ([place]) 06/08/2014   Hyperglycemia 06/07/2014   Proteinuria 06/07/2014   Tobacco user    Erectile dysfunction    Hyperlipidemia 12/13/2012   OSA (obstructive sleep apnea) 11/03/2012   HTN (hypertension) 09/16/2012   History of seizure disorder, since CVA 09/16/2012   Asthma 09/16/2012   PCP:  [name], MD Pharmacy:   [facility]  [place] [place] - [address] [place] [address] Phone: [phone] Fax: [place] [address] [place] [address] Phone: [phone] Fax: [phone]     Social Determinants of Health (SDOH) Interventions    Readmission Risk Interventions No flowsheet data found.

## 2021-03-12 NOTE — Discharge Summary (Signed)
Discharge Summary  Philip [name] TIR:[id number] DOB: 1955-09-24  PCP: [name], MD  Admit date: 03/10/2021 Discharge date: 03/12/2021  Time spent: 30 mins  Recommendations for Outpatient Follow-up:  Follow up with PCP in 1 week   Discharge Diagnoses:  Active Hospital Problems   Diagnosis Date Noted   Right leg weakness 03/10/2021   PUD (peptic ulcer disease) 03/11/2016   Hepatic cirrhosis ([place]) 06/28/2015   Diabetes mellitus ([place]) 06/08/2014   OSA (obstructive sleep apnea) 11/03/2012   HTN (hypertension) 09/16/2012   History of seizure disorder, since CVA 09/16/2012    Resolved Hospital Problems  No resolved problems to display.    Discharge Condition: Stable  Diet recommendation: Heart healthy/mod carb  Vitals:   03/12/21 0600 03/12/21 0844  BP: (!) 167/97 (!) 136/99  Pulse:  85  Resp: 17   Temp:    SpO2:      History of present illness:  Philip [name] is a 65 y.o. male with medical history significant for prior CVA, alcohol abuse, DM, hepatic cirrhosis, HTN, peptic ulcer disease. Presented to the ED with complaints of weakness in both legs for 1 day. He reports he was out walking, came home sat on his bed, and he was trying to get up and realized that he was barely able to stand or even walk. Has chronic bilateral lower extremity numbness that is unchanged.  No weakness involving his arms or legs.  No voice change no facial asymmetry. In the ED, BP noted to be uncontrolled, other VSS. Head CT without acute abnormality.  UDS positive for cocaine.Teleneurology was consulted, recommended admission for stroke work-up to include MRI brain.    Today, pt denies any worsening weakness of BLE, reports improvement. Was able to ambulate with PT. Plan to d/c home with follow up with PCP in 1 week.    Hospital Course:  Principal Problem:   Right leg weakness Active Problems:   HTN (hypertension)   History of seizure disorder, since CVA   OSA (obstructive sleep  apnea)   Diabetes mellitus (HCC)   Hepatic cirrhosis (HCC)   PUD (peptic ulcer disease)    Bilateral lower extremity weakness Unknown etiology, ??hypertensive encephalopathy Noted to be orthostatic positive No other focal neurologic deficits Head CT without acute abnormality MRI and MRA Head and neck--> no evidence of recent infarction or hemorrhage, no large vessel occlusion, moderate stenosis distal L MCA.  Small incidental left thalamic cavernous malformation Echocardiogram showed EF of 55-60%, no RWMA, Grade 1DD Neurology consulted, due to remote lacunar infarcts, recommend plavix only, discontinue aspirin PT/OT- No follow up needed Follow up with PCP   Hypertensive crisis BP improved SBP on the 200s on presentation, likely 2/2 non compliant Restart BP meds, norvasc, hydralazine, metoprolol Follow up with PCP   Diabetes mellitus type 2 HgbA1c 5.9, controlled Not on medication, continue diet control   Liver cirrhosis Stable   Seizure Hx Resume topiramate   Polysubstance abuse Abuses alcohol, tobacco, cocaine UDS is positive for cocaine, reports drinking 3 beers daily Advised to quit alcohol, tobacco and cocaine Thiamine folate multivitamins       Estimated body mass index is 29.6 kg/m as calculated from the following:   Height as of this encounter: 5\' 7"  (1.702 m).   Weight as of this encounter: 85.7 kg.    Procedures: None  Consultations: Neurology  Discharge Exam: BP (!) 136/99   Pulse 85   Temp 97.9 F (36.6 C) (Oral)   Resp 17  Ht 5\' 7"  (1.702 m)   Wt 85.7 kg   SpO2 100%   BMI 29.60 kg/m   General: NAD  Cardiovascular: S1, S2 present Respiratory: CTAB Abdomen: Soft, nontender, nondistended, bowel sounds present Musculoskeletal: No bilateral pedal edema noted Skin: Normal Psychiatry: Normal mood Neurology: Strength equal in all extremities, sensation intact    Discharge Instructions You were cared for by a hospitalist during your  hospital stay. If you have any questions about your discharge medications or the care you received while you were in the hospital after you are discharged, you can call the unit and asked to speak with the hospitalist on call if the hospitalist that took care of you is not available. Once you are discharged, your primary care physician will handle any further medical issues. Please note that NO REFILLS for any discharge medications will be authorized once you are discharged, as it is imperative that you return to your primary care physician (or establish a relationship with a primary care physician if you do not have one) for your aftercare needs so that they can reassess your need for medications and monitor your lab values.   Allergies as of 03/12/2021   No Known Allergies      Medication List     STOP taking these medications    aspirin EC 81 MG tablet   esomeprazole 40 MG capsule Commonly known as: [place] by: pantoprazole 40 MG tablet       TAKE these medications    amLODipine 10 MG tablet Commonly known as: NORVASC Take 1 tablet (10 mg total) by mouth daily. What changed:  additional instructions Another medication with the same name was removed. Continue taking this medication, and follow the directions you see here.   atorvastatin 40 MG tablet Commonly known as: LIPITOR Take 1 tablet (40 mg total) by mouth daily. For cholesterol   clopidogrel 75 MG tablet Commonly known as: Plavix Take 1 tablet (75 mg total) by mouth daily.   folic acid 1 MG tablet Commonly known as: FOLVITE Take 1 tablet (1 mg total) by mouth daily. Start taking on: March 13, 2021   gabapentin 300 MG capsule Commonly known as: NEURONTIN Take 1 capsule (300 mg total) by mouth at bedtime. (NEEDS TO BE SEEN BEFORE NEXT REFILL)   hydrALAZINE 25 MG tablet Commonly known as: APRESOLINE TAKE 1 TABLET BY MOUTH THREE TIMES DAILY What changed: additional instructions   metoprolol  succinate 50 MG 24 hr tablet Commonly known as: TOPROL-XL TAKE 1 TABLET BY MOUTH ONCE DAILY. TAKE WITH OR IMMEDIATELY FOLLOWING A MEAL What changed: additional instructions   multivitamin with minerals Tabs tablet Take 1 tablet by mouth daily. Start taking on: March 13, 2021   pantoprazole 40 MG tablet Commonly known as: PROTONIX Take 1 tablet (40 mg total) by mouth daily. Start taking on: March 13, 2021 Replaces: esomeprazole 40 MG capsule   sildenafil 20 MG tablet Commonly known as: REVATIO TAKE 2 TO 5 TABLETS BY MOUTH ONCE DAILY AS NEEDED FOR ERECTILE DYSFUNCTION What changed: See the new instructions.   thiamine 100 MG tablet Take 1 tablet (100 mg total) by mouth daily. Start taking on: March 13, 2021   topiramate 50 MG tablet Commonly known as: Topamax Take 1 tablet (50 mg total) by mouth 2 (two) times daily. What changed: when to take this       No Known Allergies  Follow-up Information     [name], MD. Schedule an appointment as soon as  possible for a visit in 1 week(s).   Specialty: Family Medicine Contact information: [address] [address] [phone]                  The results of significant diagnostics from this hospitalization (including imaging, microbiology, ancillary and laboratory) are listed below for reference.    Significant Diagnostic Studies: CT HEAD WO CONTRAST  Result Date: 03/10/2021 CLINICAL DATA:  Bilateral leg weakness EXAM: CT HEAD WITHOUT CONTRAST TECHNIQUE: Contiguous axial images were obtained from the base of the skull through the vertex without intravenous contrast. COMPARISON:  02/05/2009 FINDINGS: Brain: No evidence of acute infarction, hemorrhage, hydrocephalus, extra-axial collection or mass lesion/mass effect. Vascular: No hyperdense vessel or unexpected calcification. Skull: Normal. Negative for fracture or focal lesion. Sinuses/Orbits: Partial opacification of the left maxillary sinus.  Otherwise clear. Other: None. IMPRESSION: 1. No acute intracranial findings. 2. Partial opacification of the left maxillary sinus. Electronically Signed   By: Davina Poke D.O.   On: 03/10/2021 14:33   MR ANGIO HEAD WO CONTRAST  Result Date: 03/11/2021 CLINICAL DATA:  Right greater than left lower extremity weakness EXAM: MRI HEAD WITHOUT CONTRAST MRA HEAD WITHOUT CONTRAST MRA NECK WITHOUT AND WITH CONTRAST TECHNIQUE: Multiplanar, multi-echo pulse sequences of the brain and surrounding structures were acquired without intravenous contrast. Angiographic images of the Circle of Willis were acquired using MRA technique without intravenous contrast. Angiographic images of the neck were acquired using MRA technique without and with intravenous contrast. Carotid stenosis measurements (when applicable) are obtained utilizing NASCET criteria, using the distal internal carotid diameter as the denominator. CONTRAST:  51mL GADAVIST GADOBUTROL 1 MMOL/ML IV SOLN COMPARISON:  None FINDINGS: MRI HEAD Motion artifact is present. Brain: There is no acute infarction or intracranial hemorrhage. Small lesion of the left thalamus with blooming on susceptibility and faint hyperdensity on prior CT likely reflects a cavernous malformation. Small chronic infarcts of the pons and right thalamus. Additional patchy T2 hyperintensity in the supratentorial white matter is nonspecific but may reflect mild chronic microvascular ischemic changes. Prominence of ventricles and sulci reflects parenchymal volume loss. There is no mass effect or edema. There is no hydrocephalus or extra-axial fluid collection. Vascular: Major vessel flow voids at the skull base are preserved. Skull and upper cervical spine: Normal marrow signal is preserved. Sinuses/Orbits: Paranasal sinuses are aerated. Orbits are unremarkable. Other: Sella is unremarkable. Patchy mastoid fluid opacification. T1 and T2 hyperintense lesions of the posterior nasopharyngeal wall  likely reflect retention cysts. MRA HEAD Motion artifact is present. Intracranial internal carotid arteries are patent. Middle and anterior cerebral arteries are patent. Moderate stenosis distal left M1 MCA. Intracranial vertebral arteries, basilar artery, posterior cerebral arteries are patent. There is no aneurysm. MRA NECK Great vessel origins are patent. Common, internal, and external carotid arteries are patent. Extracranial vertebral arteries are patent. There is no hemodynamically significant stenosis. IMPRESSION: No evidence of recent infarction or hemorrhage. Small, incidental left thalamic cavernous malformation. Mild chronic microvascular ischemic changes. Few small chronic infarcts. No large vessel occlusion. No hemodynamically significant stenosis in the neck. Moderate stenosis distal left M1 MCA Electronically Signed   By: Macy Mis M.D.   On: 03/11/2021 09:56   MR ANGIO NECK W WO CONTRAST  Result Date: 03/11/2021 CLINICAL DATA:  Right greater than left lower extremity weakness EXAM: MRI HEAD WITHOUT CONTRAST MRA HEAD WITHOUT CONTRAST MRA NECK WITHOUT AND WITH CONTRAST TECHNIQUE: Multiplanar, multi-echo pulse sequences of the brain and surrounding structures were acquired without intravenous  contrast. Angiographic images of the Circle of Willis were acquired using MRA technique without intravenous contrast. Angiographic images of the neck were acquired using MRA technique without and with intravenous contrast. Carotid stenosis measurements (when applicable) are obtained utilizing NASCET criteria, using the distal internal carotid diameter as the denominator. CONTRAST:  47mL GADAVIST GADOBUTROL 1 MMOL/ML IV SOLN COMPARISON:  None FINDINGS: MRI HEAD Motion artifact is present. Brain: There is no acute infarction or intracranial hemorrhage. Small lesion of the left thalamus with blooming on susceptibility and faint hyperdensity on prior CT likely reflects a cavernous malformation. Small chronic  infarcts of the pons and right thalamus. Additional patchy T2 hyperintensity in the supratentorial white matter is nonspecific but may reflect mild chronic microvascular ischemic changes. Prominence of ventricles and sulci reflects parenchymal volume loss. There is no mass effect or edema. There is no hydrocephalus or extra-axial fluid collection. Vascular: Major vessel flow voids at the skull base are preserved. Skull and upper cervical spine: Normal marrow signal is preserved. Sinuses/Orbits: Paranasal sinuses are aerated. Orbits are unremarkable. Other: Sella is unremarkable. Patchy mastoid fluid opacification. T1 and T2 hyperintense lesions of the posterior nasopharyngeal wall likely reflect retention cysts. MRA HEAD Motion artifact is present. Intracranial internal carotid arteries are patent. Middle and anterior cerebral arteries are patent. Moderate stenosis distal left M1 MCA. Intracranial vertebral arteries, basilar artery, posterior cerebral arteries are patent. There is no aneurysm. MRA NECK Great vessel origins are patent. Common, internal, and external carotid arteries are patent. Extracranial vertebral arteries are patent. There is no hemodynamically significant stenosis. IMPRESSION: No evidence of recent infarction or hemorrhage. Small, incidental left thalamic cavernous malformation. Mild chronic microvascular ischemic changes. Few small chronic infarcts. No large vessel occlusion. No hemodynamically significant stenosis in the neck. Moderate stenosis distal left M1 MCA Electronically Signed   By: Macy Mis M.D.   On: 03/11/2021 09:56   MR BRAIN WO CONTRAST  Result Date: 03/11/2021 CLINICAL DATA:  Right greater than left lower extremity weakness EXAM: MRI HEAD WITHOUT CONTRAST MRA HEAD WITHOUT CONTRAST MRA NECK WITHOUT AND WITH CONTRAST TECHNIQUE: Multiplanar, multi-echo pulse sequences of the brain and surrounding structures were acquired without intravenous contrast. Angiographic images  of the Circle of Willis were acquired using MRA technique without intravenous contrast. Angiographic images of the neck were acquired using MRA technique without and with intravenous contrast. Carotid stenosis measurements (when applicable) are obtained utilizing NASCET criteria, using the distal internal carotid diameter as the denominator. CONTRAST:  83mL GADAVIST GADOBUTROL 1 MMOL/ML IV SOLN COMPARISON:  None FINDINGS: MRI HEAD Motion artifact is present. Brain: There is no acute infarction or intracranial hemorrhage. Small lesion of the left thalamus with blooming on susceptibility and faint hyperdensity on prior CT likely reflects a cavernous malformation. Small chronic infarcts of the pons and right thalamus. Additional patchy T2 hyperintensity in the supratentorial white matter is nonspecific but may reflect mild chronic microvascular ischemic changes. Prominence of ventricles and sulci reflects parenchymal volume loss. There is no mass effect or edema. There is no hydrocephalus or extra-axial fluid collection. Vascular: Major vessel flow voids at the skull base are preserved. Skull and upper cervical spine: Normal marrow signal is preserved. Sinuses/Orbits: Paranasal sinuses are aerated. Orbits are unremarkable. Other: Sella is unremarkable. Patchy mastoid fluid opacification. T1 and T2 hyperintense lesions of the posterior nasopharyngeal wall likely reflect retention cysts. MRA HEAD Motion artifact is present. Intracranial internal carotid arteries are patent. Middle and anterior cerebral arteries are patent. Moderate stenosis distal left M1 MCA.  Intracranial vertebral arteries, basilar artery, posterior cerebral arteries are patent. There is no aneurysm. MRA NECK Great vessel origins are patent. Common, internal, and external carotid arteries are patent. Extracranial vertebral arteries are patent. There is no hemodynamically significant stenosis. IMPRESSION: No evidence of recent infarction or hemorrhage.  Small, incidental left thalamic cavernous malformation. Mild chronic microvascular ischemic changes. Few small chronic infarcts. No large vessel occlusion. No hemodynamically significant stenosis in the neck. Moderate stenosis distal left M1 MCA Electronically Signed   By: Macy Mis M.D.   On: 03/11/2021 09:56   ECHOCARDIOGRAM COMPLETE  Result Date: 03/11/2021    ECHOCARDIOGRAM REPORT   Patient Name:   Philip [name] Date of Exam: 03/11/2021 Medical Rec #:  [id number]    Height:       67.0 in Accession #:    [id number]   Weight:       189.0 lb Date of Birth:  03/15/1956    BSA:          1.974 m Patient Age:    46 years     BP:           175/114 mmHg Patient Gender: M            HR:           62 bpm. Exam Location:  [name] Procedure: 2D Echo, Cardiac Doppler and Color Doppler Indications:    Stroke  History:        Patient has prior history of Echocardiogram examinations, most                 recent 02/07/2009. Stroke; Risk Factors:Hypertension, Diabetes,                 Dyslipidemia and Current Smoker.  Sonographer:    Wenda Low Referring Phys: [place]  1. Left ventricular ejection fraction, by estimation, is 55 to 60%. The left ventricle has normal function. The left ventricle has no regional wall motion abnormalities. Left ventricular diastolic parameters are consistent with Grade I diastolic dysfunction (impaired relaxation).  2. Right ventricular systolic function is normal. The right ventricular size is normal. There is normal pulmonary artery systolic pressure. The estimated right ventricular systolic pressure is 26.3 mmHg.  3. The mitral valve is grossly normal. No evidence of mitral valve regurgitation. No evidence of mitral stenosis.  4. The aortic valve is tricuspid. Aortic valve regurgitation is not visualized. No aortic stenosis is present.  5. The inferior vena cava is normal in size with greater than 50% respiratory variability, suggesting right atrial  pressure of 3 mmHg. Conclusion(s)/Recommendation(s): Normal biventricular function without evidence of hemodynamically significant valvular heart disease. No intracardiac source of embolism detected on this transthoracic study. A transesophageal echocardiogram is recommended to exclude cardiac source of embolism if clinically indicated. FINDINGS  Left Ventricle: Left ventricular ejection fraction, by estimation, is 55 to 60%. The left ventricle has normal function. The left ventricle has no regional wall motion abnormalities. The left ventricular internal cavity size was normal in size. There is  no left ventricular hypertrophy. Left ventricular diastolic parameters are consistent with Grade I diastolic dysfunction (impaired relaxation). Right Ventricle: The right ventricular size is normal. No increase in right ventricular wall thickness. Right ventricular systolic function is normal. There is normal pulmonary artery systolic pressure. The tricuspid regurgitant velocity is 1.79 m/s, and  with an assumed right atrial pressure of 3 mmHg, the estimated right ventricular systolic pressure is 78.5 mmHg. Left Atrium:  Left atrial size was normal in size. Right Atrium: Right atrial size was normal in size. Pericardium: There is no evidence of pericardial effusion. Presence of pericardial fat pad. Mitral Valve: The mitral valve is grossly normal. No evidence of mitral valve regurgitation. No evidence of mitral valve stenosis. MV peak gradient, 2.9 mmHg. The mean mitral valve gradient is 1.0 mmHg. Tricuspid Valve: The tricuspid valve is grossly normal. Tricuspid valve regurgitation is not demonstrated. No evidence of tricuspid stenosis. Aortic Valve: The aortic valve is tricuspid. Aortic valve regurgitation is not visualized. No aortic stenosis is present. Aortic valve mean gradient measures 4.0 mmHg. Aortic valve peak gradient measures 7.5 mmHg. Aortic valve area, by VTI measures 2.40 cm. Pulmonic Valve: The pulmonic valve  was grossly normal. Pulmonic valve regurgitation is not visualized. No evidence of pulmonic stenosis. Aorta: The aortic root is normal in size and structure. Venous: The inferior vena cava is normal in size with greater than 50% respiratory variability, suggesting right atrial pressure of 3 mmHg. IAS/Shunts: The atrial septum is grossly normal.  LEFT VENTRICLE PLAX 2D LVIDd:         4.10 cm     Diastology LVIDs:         2.70 cm     LV e' medial:    5.77 cm/s LV PW:         0.80 cm     LV E/e' medial:  10.8 LV IVS:        1.10 cm     LV e' lateral:   10.30 cm/s LVOT diam:     2.10 cm     LV E/e' lateral: 6.1 LV SV:         71 LV SV Index:   36 LVOT Area:     3.46 cm  LV Volumes (MOD) LV vol d, MOD A2C: 32.2 ml LV vol d, MOD A4C: 41.1 ml LV vol s, MOD A2C: 15.2 ml LV vol s, MOD A4C: 20.5 ml LV SV MOD A2C:     17.0 ml LV SV MOD A4C:     41.1 ml LV SV MOD BP:      18.9 ml RIGHT VENTRICLE RV Basal diam:  3.65 cm RV Mid diam:    3.30 cm RV S prime:     10.30 cm/s TAPSE (M-mode): 1.8 cm LEFT ATRIUM             Index        RIGHT ATRIUM           Index LA diam:        2.40 cm 1.22 cm/m   RA Area:     16.60 cm LA Vol (A2C):   40.0 ml 20.26 ml/m  RA Volume:   47.30 ml  23.96 ml/m LA Vol (A4C):   33.7 ml 17.07 ml/m LA Biplane Vol: 37.6 ml 19.05 ml/m  AORTIC VALVE                    PULMONIC VALVE AV Area (Vmax):    2.49 cm     PV Vmax:       0.76 m/s AV Area (Vmean):   2.42 cm     PV Peak grad:  2.3 mmHg AV Area (VTI):     2.40 cm AV Vmax:           137.00 cm/s AV Vmean:          95.200 cm/s AV VTI:  0.295 m AV Peak Grad:      7.5 mmHg AV Mean Grad:      4.0 mmHg LVOT Vmax:         98.60 cm/s LVOT Vmean:        66.400 cm/s LVOT VTI:          0.204 m LVOT/AV VTI ratio: 0.69  AORTA Ao Root diam: 3.40 cm MITRAL VALVE               TRICUSPID VALVE MV Area (PHT): 3.05 cm    TR Peak grad:   12.8 mmHg MV Area VTI:   2.66 cm    TR Vmax:        179.00 cm/s MV Peak grad:  2.9 mmHg MV Mean grad:  1.0 mmHg    SHUNTS  MV Vmax:       0.85 m/s    Systemic VTI:  0.20 m MV Vmean:      44.2 cm/s   Systemic Diam: 2.10 cm MV Decel Time: 249 msec MV E velocity: 62.60 cm/s MV A velocity: 92.50 cm/s MV E/A ratio:  0.68 Eleonore Chiquito MD Electronically signed by Eleonore Chiquito MD Signature Date/Time: 03/11/2021/4:30:57 PM    Final     Microbiology: Recent Results (from the past 240 hour(s))  Resp Panel by RT-PCR (Flu A&B, Covid) Nasopharyngeal Swab     Status: None   Collection Time: 03/10/21  1:55 PM   Specimen: Nasopharyngeal Swab; Nasopharyngeal(NP) swabs in vial transport medium  Result Value Ref Range Status   SARS Coronavirus 2 by RT PCR NEGATIVE NEGATIVE Final    Comment: (NOTE) SARS-CoV-2 target nucleic acids are NOT DETECTED.  The SARS-CoV-2 RNA is generally detectable in upper respiratory specimens during the acute phase of infection. The lowest concentration of SARS-CoV-2 viral copies this assay can detect is 138 copies/mL. A negative result does not preclude SARS-Cov-2 infection and should not be used as the sole basis for treatment or other patient management decisions. A negative result may occur with  improper specimen collection/handling, submission of specimen other than nasopharyngeal swab, presence of viral mutation(s) within the areas targeted by this assay, and inadequate number of viral copies(<138 copies/mL). A negative result must be combined with clinical observations, patient history, and epidemiological information. The expected result is Negative.  Fact Sheet for Patients:  EntrepreneurPulse.com.au  Fact Sheet for Healthcare Providers:  IncredibleEmployment.be  This test is no t yet approved or cleared by the [place] FDA and  has been authorized for detection and/or diagnosis of SARS-CoV-2 by FDA under an Emergency Use Authorization (EUA). This EUA will remain  in effect (meaning this test can be used) for the duration of the COVID-19  declaration under Section 564(b)(1) of the Act, 21 U.S.C.section 360bbb-3(b)(1), unless the authorization is terminated  or revoked sooner.       Influenza A by PCR NEGATIVE NEGATIVE Final   Influenza B by PCR NEGATIVE NEGATIVE Final    Comment: (NOTE) The Xpert Xpress SARS-CoV-2/FLU/RSV plus assay is intended as an aid in the diagnosis of influenza from Nasopharyngeal swab specimens and should not be used as a sole basis for treatment. Nasal washings and aspirates are unacceptable for Xpert Xpress SARS-CoV-2/FLU/RSV testing.  Fact Sheet for Patients: EntrepreneurPulse.com.au  Fact Sheet for Healthcare Providers: IncredibleEmployment.be  This test is not yet approved or cleared by the [place] FDA and has been authorized for detection and/or diagnosis of SARS-CoV-2 by FDA under an Emergency Use Authorization (EUA). This EUA will  remain in effect (meaning this test can be used) for the duration of the COVID-19 declaration under Section 564(b)(1) of the Act, 21 U.S.C. section [id number](b)(1), unless the authorization is terminated or revoked.  Performed at [facility], [address] [place], [place] [address]      Labs: Basic Metabolic Panel: Recent Labs  Lab 03/10/21 1355 03/10/21 1431 03/12/21 0424  NA 132* 135 135  K 4.1 4.2 3.7  CL 97* 98 102  CO2 26  --  26  GLUCOSE 121* 118* 114*  BUN 5* 3* 11  CREATININE 1.18 1.10 1.24  CALCIUM 8.7*  --  8.4*  MG 2.5*  --   --   PHOS 2.3*  --   --    Liver Function Tests: Recent Labs  Lab 03/10/21 1355  AST 49*  ALT 39  ALKPHOS 165*  BILITOT 1.0  PROT 7.7  ALBUMIN 3.8   No results for input(s): LIPASE, AMYLASE in the last 168 hours. No results for input(s): AMMONIA in the last 168 hours. CBC: Recent Labs  Lab 03/10/21 1355 03/10/21 1431 03/12/21 0424  WBC 6.4  --  6.9  NEUTROABS 3.8  --  3.9  HGB 17.9* 17.7* 15.4  HCT 52.7* 52.0 46.0  MCV 93.8  --  94.8  PLT 120*   --  97*   Cardiac Enzymes: No results for input(s): CKTOTAL, CKMB, CKMBINDEX, TROPONINI in the last 168 hours. BNP: BNP (last 3 results) No results for input(s): BNP in the last 8760 hours.  ProBNP (last 3 results) No results for input(s): PROBNP in the last 8760 hours.  CBG: Recent Labs  Lab 03/11/21 0737 03/11/21 1132 03/11/21 1521 03/11/21 1628 03/12/21 0745  GLUCAP 119* 163* 99 110* 129*       Signed:  Alma Friendly, MD [facility] 03/12/2021, 11:18 AM

## 2021-03-12 NOTE — Progress Notes (Signed)
Pt wheeled out front entrance/short stay via staff to car with sister.

## 2021-03-12 NOTE — Evaluation (Signed)
Physical Therapy Evaluation Patient Details Name: Philip [name] MRN: [id number] DOB: 1955-08-23 Today's Date: 03/12/2021  History of Present Illness  Philip [name] is a 65 y.o. male presents with BLE weakness, difficulty walking. Head CT without acute abnormality. UDS positive for cocaine. MRI No evidence of recent infarction or hemorrhage. PMH: HTN, diabetes, stroke 2017, seizures   Clinical Impression  Pt admitted with above diagnosis. Pt reports being independent at home, reports currently feeling back to baseline, reports a little "slow" from being in bed at hospital. Pt requests to use RW after initial STS without AD, provided and pt performs transfers and ambulation in hallway with supv, slightly impulsive, changing speeds and BOS without warning due to pants sliding down and pt not stopping to readjust. Educated pt on safety with ambulation and impulsive movements to improve safety and pt verbalizes understanding. Pt currently with functional limitations due to the deficits listed below (see PT Problem List). Pt will benefit from skilled PT to increase their independence and safety with mobility to allow discharge to the venue listed below.          Recommendations for follow up therapy are one component of a multi-disciplinary discharge planning process, led by the attending physician.  Recommendations may be updated based on patient status, additional functional criteria and insurance authorization.  Follow Up Recommendations No PT follow up    Equipment Recommendations  Rolling walker with 5" wheels    Recommendations for Other Services       Precautions / Restrictions Precautions Precautions: Fall Restrictions Weight Bearing Restrictions: No      Mobility  Bed Mobility Overal bed mobility: Modified Independent  General bed mobility comments: mild labored movement.    Transfers Overall transfer level: Needs assistance Equipment used: Rolling walker (2  wheeled);None Transfers: Sit to/from Stand Sit to Stand: Supervision  General transfer comment: slightly impulsive to power to stand with BLE braced against bed, good steadiness upon rising, supv for safety, performs initial with no AD and following with RW  Ambulation/Gait Ambulation/Gait assistance: Supervision Gait Distance (Feet): 200 Feet Assistive device: Rolling walker (2 wheeled) Gait Pattern/deviations: Step-through pattern;Decreased stride length General Gait Details: pt requests RW, step through pattern with quickly steps and slightly impulsive RW management, no LOB, equal bil step length and good dorsiflexion bil, impulsive speed changes and BOS changes due to pants sliding down and pt not stopping to readjust  Stairs            Wheelchair Mobility    Modified Rankin (Stroke Patients Only)       Balance Overall balance assessment: Needs assistance Sitting-balance support: No upper extremity supported;Feet supported Sitting balance-Leahy Scale: Good Sitting balance - Comments: seated EOB   Standing balance support: During functional activity Standing balance-Leahy Scale: Fair Standing balance comment: static without UE support, dynamic with RW       Pertinent Vitals/Pain Pain Assessment: No/denies pain    Home Living Family/patient expects to be discharged to:: Private residence Living Arrangements: Alone Available Help at Discharge: Family;Available PRN/intermittently Type of Home: Apartment Home Access: Level entry     Home Layout: One level Home Equipment: Grab bars - tub/shower;Cane - single point Additional Comments: Pt reports "I'm moving in this week"    Prior Function Level of Independence: Independent  Comments: Pt reports independent with community ambulation, ADLs/IADLs, can drive.     Hand Dominance   Dominant Hand: Right    Extremity/Trunk Assessment   Upper Extremity Assessment Upper Extremity Assessment: Defer  to OT  evaluation    Lower Extremity Assessment Lower Extremity Assessment: Overall WFL for tasks assessed (AROM WNL, strength 4+/5 throughout, symmetrical, reports numbness in plantar surface of bil feet that is "normal")    Cervical / Trunk Assessment Cervical / Trunk Assessment: Normal  Communication   Communication: No difficulties  Cognition Arousal/Alertness: Awake/alert Behavior During Therapy: WFL for tasks assessed/performed Overall Cognitive Status: Within Functional Limits for tasks assessed         General Comments      Exercises     Assessment/Plan    PT Assessment Patient needs continued PT services  PT Problem List Decreased activity tolerance;Decreased balance;Decreased cognition;Decreased knowledge of use of DME;Decreased safety awareness;Impaired sensation       PT Treatment Interventions DME instruction;Gait training;Functional mobility training;Therapeutic activities;Therapeutic exercise;Balance training;Patient/family education    PT Goals (Current goals can be found in the Care Plan section)  Acute Rehab PT Goals Patient Stated Goal: "go home today" PT Goal Formulation: With patient Time For Goal Achievement: 03/26/21 Potential to Achieve Goals: Good    Frequency Min 3X/week   Barriers to discharge        Co-evaluation PT/OT/SLP Co-Evaluation/Treatment: Yes Reason for Co-Treatment: To address functional/ADL transfers PT goals addressed during session: Mobility/safety with mobility;Balance;Proper use of DME OT goals addressed during session: ADL's and self-care       AM-PAC PT "6 Clicks" Mobility  Outcome Measure Help needed turning from your back to your side while in a flat bed without using bedrails?: None Help needed moving from lying on your back to sitting on the side of a flat bed without using bedrails?: None Help needed moving to and from a bed to a chair (including a wheelchair)?: A Little Help needed standing up from a chair using  your arms (e.g., wheelchair or bedside chair)?: A Little Help needed to walk in hospital room?: A Little Help needed climbing 3-5 steps with a railing? : A Little 6 Click Score: 20    End of Session Equipment Utilized During Treatment: Gait belt Activity Tolerance: Patient tolerated treatment well Patient left: in bed;with call bell/phone within reach;with bed alarm set;Other (comment) (MD in room) Nurse Communication: Mobility status PT Visit Diagnosis: Unsteadiness on feet (R26.81);Other abnormalities of gait and mobility (R26.89);Muscle weakness (generalized) (M62.81)    Time: 0981-1914 PT Time Calculation (min) (ACUTE ONLY): 14 min   Charges:   PT Evaluation $PT Eval Low Complexity: 1 Low           Tori Lillyann Ahart PT, DPT 03/12/21, 10:43 AM

## 2021-03-13 ENCOUNTER — Telehealth: Payer: Self-pay

## 2021-03-13 NOTE — Telephone Encounter (Signed)
Transition Care Management Follow-up Telephone Call Date of discharge and from where: [name] 03/12/21 Diagnosis: right leg weakness How have you been since you were released from the hospital? He says he feels great Any questions or concerns? No  Items Reviewed: Did the pt receive and understand the discharge instructions provided? Yes  Medications obtained and verified? Yes  Other? No  Any new allergies since your discharge? No  Dietary orders reviewed? Yes Do you have support at home? Yes   Home Care and Equipment/Supplies: Were home health services ordered? no  Were any new equipment or medical supplies ordered?  No but he has been using his friend's walker  Functional Questionnaire: (I = Independent and D = Dependent) ADLs: I  Bathing/Dressing- I  Meal Prep- I  Eating- I  Maintaining continence- I  Transferring/Ambulation- I  Managing Meds- I  Follow up appointments reviewed:  PCP Hospital f/u appt confirmed? Yes  Scheduled to see Philip [name] on 03/20/21 @ 1:40. [facility] f/u appt confirmed? No   Are transportation arrangements needed? No  If their condition worsens, is the pt aware to call PCP or go to the Emergency Dept.? Yes Was the patient provided with contact information for the PCP's office or ED? Yes Was to pt encouraged to call back with questions or concerns? Yes

## 2021-03-20 ENCOUNTER — Other Ambulatory Visit: Payer: Self-pay

## 2021-03-20 ENCOUNTER — Ambulatory Visit (INDEPENDENT_AMBULATORY_CARE_PROVIDER_SITE_OTHER): Payer: Medicare Other | Admitting: Family Medicine

## 2021-03-20 ENCOUNTER — Encounter: Payer: Self-pay | Admitting: Family Medicine

## 2021-03-20 VITALS — BP 135/74 | HR 67 | Temp 97.5°F | Ht 67.0 in | Wt 186.2 lb

## 2021-03-20 DIAGNOSIS — G8929 Other chronic pain: Secondary | ICD-10-CM | POA: Diagnosis not present

## 2021-03-20 DIAGNOSIS — R519 Headache, unspecified: Secondary | ICD-10-CM

## 2021-03-20 DIAGNOSIS — R29898 Other symptoms and signs involving the musculoskeletal system: Secondary | ICD-10-CM | POA: Diagnosis not present

## 2021-03-20 DIAGNOSIS — I6930 Unspecified sequelae of cerebral infarction: Secondary | ICD-10-CM | POA: Diagnosis not present

## 2021-03-20 DIAGNOSIS — N529 Male erectile dysfunction, unspecified: Secondary | ICD-10-CM

## 2021-03-20 MED ORDER — GABAPENTIN 300 MG PO CAPS: 600.0000 mg | ORAL_CAPSULE | Freq: Every day | ORAL | 2 refills | Status: DC

## 2021-03-20 NOTE — Progress Notes (Signed)
Subjective:  Patient ID: Philip [name], male    DOB: January 01, 1956  Age: 65 y.o. MRN: [id number]  CC: Transitions Of Care ([facility] DC 03/12/21)   HPI DEQUAVION FOLLETTE presents for Care transition viisit due to Hospitalization at [facility] 10/16 - 03/12/2021 for weakness of the legs. His legs are chronically numb.Getting better  since taking meds from hospital. Sensation of legs going to sleep is intermittent. Left foot feels swollen but it isn't. Lower back is sore.   Hospital record reviewed showing use of cocaine. MRI, CT of head and MRA head & neck all were negative for stroke, Tumor, aneurysm etc. One possible AVM felt to be incidental.  PT. States he feels better. Sx as above remain, but decreased in intensity.    presents for  follow-up of hypertension. Patient has no history of headache chest pain or shortness of breath or recent cough. Patient also denies symptoms of TIA such as focal numbness or weakness. Patient denies side effects from medication. States taking it regularly.   Depression screen [facility] 2/9 03/20/2021 01/09/2020 06/16/2019  Decreased Interest 0 0 0  Down, Depressed, Hopeless 0 0 0  PHQ - 2 Score 0 0 0  Altered sleeping 0 - -  Tired, decreased energy 1 - -  Change in appetite 0 - -  Feeling bad or failure about yourself  2 - -  Trouble concentrating 3 - -  Moving slowly or fidgety/restless 0 - -  Suicidal thoughts 0 - -  PHQ-9 Score 6 - -  Difficult doing work/chores Somewhat difficult - -    History Delshawn has a past medical history of Chronic cholecystitis with calculus (02/04/2013), CVA (cerebral vascular accident) (09/16/2012), Diabetes mellitus without complication ([place]), Erectile dysfunction, Gallstones (01/27/2013), GERD (gastroesophageal reflux disease), H. pylori infection (09/28/2017), Headache(784.0), Hepatitis C, History of kidney stones, History of migraine, Hypertension, Hypertension, Nausea, Seizures ([place]), Short-term memory loss, Sleep apnea, Stroke ([place]), Tobacco  user, and Umbilical hernia (01/11/2992).   He has a past surgical history that includes Umbilical hernia repair (N/A, 03/09/2013); Colonoscopy (06/2014); Esophagogastroduodenoscopy (egd) with propofol (N/A, 08/21/2015); Cholecystectomy (N/A, 03/09/2013); Esophagogastroduodenoscopy (egd) with propofol (N/A, 03/10/2017); and biopsy (03/10/2017).   His family history includes Cancer in his cousin; Colon cancer in his maternal uncle; Heart disease in his maternal grandfather, maternal grandmother, paternal grandfather, and paternal grandmother; Hypertension in his mother.He reports that he has been smoking cigarettes. He started smoking about 42 years ago. He has a 5.00 pack-year smoking history. He has never used smokeless tobacco. He reports current alcohol use of about 3.0 standard drinks per week. He reports that he does not use drugs.    ROS Review of Systems  Constitutional: Negative.   HENT: Negative.    Eyes:  Negative for visual disturbance.  Respiratory:  Negative for cough and shortness of breath.   Cardiovascular:  Negative for chest pain and leg swelling.  Gastrointestinal:  Negative for abdominal pain, diarrhea, nausea and vomiting.  Genitourinary:  Negative for difficulty urinating.  Musculoskeletal:  Positive for back pain. Negative for arthralgias and myalgias.  Skin:  Negative for rash.  Neurological:  Positive for numbness. Negative for headaches.  Psychiatric/Behavioral:  Negative for sleep disturbance.    Objective:  BP 135/74   Pulse 67   Temp (!) 97.5 F (36.4 C)   Ht 5\' 7"  (1.702 m)   Wt 186 lb 3.2 oz (84.5 kg)   SpO2 98%   BMI 29.16 kg/m   BP Readings from Last  3 Encounters:  03/20/21 135/74  03/12/21 (!) 136/99  01/09/20 137/84    Wt Readings from Last 3 Encounters:  03/20/21 186 lb 3.2 oz (84.5 kg)  03/11/21 189 lb (85.7 kg)  01/09/20 198 lb 4 oz (89.9 kg)     Physical Exam Constitutional:      General: He is not in acute distress.    Appearance:  He is well-developed.  HENT:     Head: Normocephalic and atraumatic.     Right Ear: External ear normal.     Left Ear: External ear normal.     Nose: Nose normal.  Eyes:     Conjunctiva/sclera: Conjunctivae normal.     Pupils: Pupils are equal, round, and reactive to light.  Cardiovascular:     Rate and Rhythm: Normal rate and regular rhythm.     Heart sounds: Normal heart sounds. No murmur heard. Pulmonary:     Effort: Pulmonary effort is normal. No respiratory distress.     Breath sounds: Normal breath sounds. No wheezing or rales.  Abdominal:     Palpations: Abdomen is soft.     Tenderness: There is no abdominal tenderness.  Musculoskeletal:        General: Normal range of motion.     Cervical back: Normal range of motion and neck supple.  Skin:    General: Skin is warm and dry.  Neurological:     Mental Status: He is alert and oriented to person, place, and time.     Deep Tendon Reflexes: Reflexes are normal and symmetric.  Psychiatric:        Behavior: Behavior normal.        Thought Content: Thought content normal.        Judgment: Judgment normal.      Assessment & Plan:   Djuan was seen today for transitions of care.  Diagnoses and all orders for this visit:  Right leg weakness  Erectile dysfunction, unspecified erectile dysfunction type  Chronic nonintractable headache, unspecified headache type -     gabapentin (NEURONTIN) 300 MG capsule; Take 2 capsules (600 mg total) by mouth at bedtime.      I have changed Willford B. Devilla's gabapentin. I am also having him maintain his topiramate, atorvastatin, sildenafil, amLODipine, hydrALAZINE, metoprolol succinate, folic acid, multivitamin with minerals, thiamine, clopidogrel, and pantoprazole.  Allergies as of 03/20/2021   No Known Allergies      Medication List        Accurate as of March 20, 2021 11:59 PM. If you have any questions, ask your nurse or doctor.          amLODipine 10 MG  tablet Commonly known as: NORVASC Take 1 tablet (10 mg total) by mouth daily.   atorvastatin 40 MG tablet Commonly known as: LIPITOR Take 1 tablet (40 mg total) by mouth daily. For cholesterol   clopidogrel 75 MG tablet Commonly known as: Plavix Take 1 tablet (75 mg total) by mouth daily.   folic acid 1 MG tablet Commonly known as: FOLVITE Take 1 tablet (1 mg total) by mouth daily.   gabapentin 300 MG capsule Commonly known as: NEURONTIN Take 2 capsules (600 mg total) by mouth at bedtime. What changed:  how much to take additional instructions Changed by: [name], MD   hydrALAZINE 25 MG tablet Commonly known as: APRESOLINE TAKE 1 TABLET BY MOUTH THREE TIMES DAILY   metoprolol succinate 50 MG 24 hr tablet Commonly known as: TOPROL-XL TAKE 1 TABLET BY MOUTH ONCE  DAILY. TAKE WITH OR IMMEDIATELY FOLLOWING A MEAL   multivitamin with minerals Tabs tablet Take 1 tablet by mouth daily.   pantoprazole 40 MG tablet Commonly known as: PROTONIX Take 1 tablet (40 mg total) by mouth daily.   sildenafil 20 MG tablet Commonly known as: REVATIO TAKE 2 TO 5 TABLETS BY MOUTH ONCE DAILY AS NEEDED FOR ERECTILE DYSFUNCTION What changed: See the new instructions.   thiamine 100 MG tablet Take 1 tablet (100 mg total) by mouth daily.   topiramate 50 MG tablet Commonly known as: Topamax Take 1 tablet (50 mg total) by mouth 2 (two) times daily. What changed: when to take this         Follow-up: No follow-ups on file.  [name], M.D.

## 2021-03-24 ENCOUNTER — Encounter: Payer: Self-pay | Admitting: Family Medicine

## 2021-04-16 ENCOUNTER — Other Ambulatory Visit: Payer: Self-pay | Admitting: Family Medicine

## 2021-04-16 DIAGNOSIS — N529 Male erectile dysfunction, unspecified: Secondary | ICD-10-CM

## 2021-04-29 ENCOUNTER — Ambulatory Visit (INDEPENDENT_AMBULATORY_CARE_PROVIDER_SITE_OTHER): Payer: Medicare Other | Admitting: Nurse Practitioner

## 2021-04-29 ENCOUNTER — Other Ambulatory Visit: Payer: Self-pay

## 2021-04-29 ENCOUNTER — Ambulatory Visit (INDEPENDENT_AMBULATORY_CARE_PROVIDER_SITE_OTHER): Payer: Medicare Other

## 2021-04-29 ENCOUNTER — Telehealth: Payer: Self-pay | Admitting: Family Medicine

## 2021-04-29 ENCOUNTER — Encounter: Payer: Self-pay | Admitting: Nurse Practitioner

## 2021-04-29 VITALS — BP 186/115 | HR 95 | Temp 97.5°F | Resp 20 | Ht 67.0 in | Wt 189.0 lb

## 2021-04-29 DIAGNOSIS — M7732 Calcaneal spur, left foot: Secondary | ICD-10-CM

## 2021-04-29 DIAGNOSIS — G8929 Other chronic pain: Secondary | ICD-10-CM

## 2021-04-29 DIAGNOSIS — E785 Hyperlipidemia, unspecified: Secondary | ICD-10-CM

## 2021-04-29 DIAGNOSIS — R519 Headache, unspecified: Secondary | ICD-10-CM

## 2021-04-29 DIAGNOSIS — I10 Essential (primary) hypertension: Secondary | ICD-10-CM

## 2021-04-29 MED ORDER — ATORVASTATIN CALCIUM 40 MG PO TABS: 40.0000 mg | ORAL_TABLET | Freq: Every day | ORAL | 0 refills | Status: DC

## 2021-04-29 MED ORDER — TOPIRAMATE 50 MG PO TABS: 50.0000 mg | ORAL_TABLET | Freq: Two times a day (BID) | ORAL | 0 refills | Status: DC

## 2021-04-29 MED ORDER — AMLODIPINE BESYLATE 10 MG PO TABS: 10.0000 mg | ORAL_TABLET | Freq: Every day | ORAL | 2 refills | Status: DC

## 2021-04-29 MED ORDER — GABAPENTIN 300 MG PO CAPS: 600.0000 mg | ORAL_CAPSULE | Freq: Every day | ORAL | 2 refills | Status: DC

## 2021-04-29 MED ORDER — HYDRALAZINE HCL 25 MG PO TABS: ORAL_TABLET | ORAL | 0 refills | Status: DC

## 2021-04-29 MED ORDER — METOPROLOL SUCCINATE ER 50 MG PO TB24: ORAL_TABLET | ORAL | 2 refills | Status: DC

## 2021-04-29 MED ORDER — PANTOPRAZOLE SODIUM 40 MG PO TBEC: 40.0000 mg | DELAYED_RELEASE_TABLET | Freq: Every day | ORAL | 2 refills | Status: DC

## 2021-04-29 MED ORDER — IBUPROFEN 600 MG PO TABS: 600.0000 mg | ORAL_TABLET | Freq: Three times a day (TID) | ORAL | 0 refills | Status: DC | PRN

## 2021-04-29 NOTE — Telephone Encounter (Signed)
Patient was seen today and his  BP was elevated.  Patient needs refills on all his medications sent to [facility].  Refills sent to pharmacy.  Follow up appointment scheduled on 05/15/21 to recheck blood pressure.

## 2021-04-29 NOTE — Patient Instructions (Signed)
Heel Spur A heel spur is a bony growth that forms on the bottom of the heel bone (calcaneus). Heel spurs are common. They often cause inflammation in the plantar fascia, which is the band of tissue that connects the toe bones to the heel bone. When the plantar fascia is inflamed, it is called plantar fasciitis. This may cause pain on the bottom of the foot, near the heel. Many people with plantar fasciitis also have heel spurs. However, spurs are not the cause of plantar fasciitis pain. What are the causes? The exact cause of heel spurs is not known. They may be caused by: Pressure on the heel bone. Bands of tissues that connect muscle to bone (tendons) pulling on the heel bone. What increases the risk? You are more likely to develop this condition if you: Are older than age 26. Are overweight. Have wear-and-tear arthritis (osteoarthritis). Have plantar fascia inflammation. Participate in sports or activities that include a lot of running or jumping. Wear poorly fitted shoes. What are the signs or symptoms? Some people have no symptoms. If you do have symptoms, they may include: Pain in the bottom of your heel. Pain that is worse when you first get out of bed. Pain that gets worse after walking or standing. How is this diagnosed? This condition may be diagnosed based on: Your symptoms and medical history. A physical exam. A foot X-ray. How is this treated? Treatment for this condition depends on how much pain you have. Treatment options may include: Doing stretching exercises and losing weight, if necessary. Wearing specific shoes or inserts inside of shoes (orthotics) for comfort and support. Wearing splints on your feet while you sleep. Splints keep your feet in a position (usually a 90-degree angle) that should prevent and relieve the pain you feel when you first get out of bed. They also make stretching easier in the morning. Taking over-the-counter medicine to relieve pain, such as  NSAIDs. Using high-intensity sound waves to break up the heel spur (extracorporeal shock wave therapy). Getting steroid injections in your heel to reduce inflammation. Having surgery, if your heel spur causes long-term (chronic) pain. Follow these instructions at home: Activity Avoid activities that cause pain until you recover, or for as long as told by your health care provider. Do stretching exercises as told. Stretch before exercising or being physically active. Managing pain, stiffness, and swelling If directed, put ice on your foot. To do this: Put ice in a plastic bag. Place a towel between your skin and the bag. Leave the ice on for 20 minutes, 2-3 times a day. Remove the ice if your skin turns bright red. This is very important. If you cannot feel pain, heat, or cold, you have a greater risk of damage to the area. Move your toes often to reduce stiffness and swelling. When possible, raise (elevate) your foot above the level of your heart while you are sitting or lying down. General instructions Take over-the-counter and prescription medicines only as told by your health care provider. Wear supportive shoes that fit well. Wear splints, inserts, or orthotics as told by your health care provider. If recommended, work with your health care provider to lose weight. This can relieve pressure on your foot. Do not use any products that contain nicotine or tobacco, such as cigarettes, e-cigarettes, and chewing tobacco. These can delay bone healing. If you need help quitting, ask your health care provider. Keep all follow-up visits. This is important. Where to find more information American Academy of  Orthopaedic Surgeons: www.orthoinfo.aaos.org Contact a health care provider if: Your pain does not go away with treatment. Your pain gets worse. Summary A heel spur is a bony growth that forms on the bottom of the heel bone (calcaneus). Heel spurs often cause inflammation in the plantar  fascia, which is the band of tissue that connects the toes to the heel bone. This may cause pain on the bottom of the foot, near the heel. Doing stretching exercises, losing weight, wearing specific shoes or shoe inserts, wearing splints while you sleep, and taking pain medicine may ease the pain and stiffness. Other treatment options may include high-intensity sound waves to break up the heel spur, steroid injections, or surgery. This information is not intended to replace advice given to you by your health care provider. Make sure you discuss any questions you have with your health care provider. Document Revised: 09/06/2019 Document Reviewed: 09/06/2019 Elsevier Patient Education  [place].

## 2021-04-29 NOTE — Assessment & Plan Note (Signed)
Patient reporting worsening pain and discomfort to left heel in the past few weeks.  Completed x-ray, results pending.  Anti-inflammatory [ibuprofen 600 mg tablet by mouth for pain].  Warm compress or ice as tolerated.  Education provided to patient printed handouts given.  If symptoms are not resolved patient may need to see orthopedic or foot doctor.  Patient's feet is flat and he may be needing balance at the moment patient is using a walker to help with his balance and reports the symptoms are worse when walking but pain is mild only about a 4.

## 2021-04-29 NOTE — Progress Notes (Addendum)
Acute Office Visit  Subjective:    Patient ID: Philip [name], male    DOB: [birth year], 65 y.o.   MRN: [id number]  Chief Complaint  Patient presents with   Foot Pain    Left- started 2-3 weeks ago        Foot Pain This is a new problem. The current episode started 1 to 4 weeks ago. The problem has been gradually worsening. Associated symptoms include arthralgias. Pertinent negatives include no abdominal pain, fatigue, joint swelling or rash. The symptoms are aggravated by walking. He has tried acetaminophen for the symptoms. The treatment provided no relief.   Past Medical History:  Diagnosis Date   Chronic cholecystitis with calculus 02/04/2013   CVA (cerebral vascular accident) 09/16/2012   Diabetes mellitus without complication ([place])    borderline   Erectile dysfunction    Gallstones 01/27/2013   GERD (gastroesophageal reflux disease)    H. pylori infection 09/28/2017   Headache(784.0)    "Headache, I dont think they are migranes"   Hepatitis C    [place], completed 06/2015 (Dr. Linus Salmons) , AUG 2017 SVR   History of kidney stones    history of kidney stones   History of migraine    takes Topamax daily   Hypertension    takes Hydralazine,Maxzide,Clonidine,and Amlodipine daily   Hypertension    also takes Metoprolol daily   Nausea    takes Zofran daily   Seizures (HCC)    off Dilantin daily.  last one was when he had a stroke.   Short-term memory loss    due to stroke    Sleep apnea    CPAP- tries to do it everyday.   Stroke ([place])    .  Short term memory loss.; left sided weakness   Tobacco user    Umbilical hernia 06/03/3233    Past Surgical History:  Procedure Laterality Date   BIOPSY  03/10/2017   Procedure: BIOPSY;  Surgeon: Danie Binder, MD;  [facility];  Service: Endoscopy;;  gastric biopsy    CHOLECYSTECTOMY N/A 03/09/2013   Procedure: LAPAROSCOPIC CHOLECYSTECTOMY WITH INTRAOPERATIVE CHOLANGIOGRAM;  Surgeon: Imogene Burn. Georgette Dover, MD;  Location: [facility];  Service: General;  Laterality: N/A;   COLONOSCOPY  06/2014   Dr. Olevia Perches: Three sessile polyps were found in the rectum x2 and descending. tubular adenomas. colonoscopy in 06/2019   ESOPHAGOGASTRODUODENOSCOPY (EGD) WITH PROPOFOL N/A 08/21/2015   Dr. Oneida Alar: normal esophagus, non-bleeding gastric ulcers/gastritis (H.pylori)    ESOPHAGOGASTRODUODENOSCOPY (EGD) WITH PROPOFOL N/A 03/10/2017   Procedure: ESOPHAGOGASTRODUODENOSCOPY (EGD) WITH PROPOFOL;  Surgeon: Danie Binder, MD;  [facility];  Service: Endoscopy;  Laterality: N/A;  1:00pm-rescheduled to 10\16 @ 5:73UK    UMBILICAL HERNIA REPAIR N/A 03/09/2013   Procedure: HERNIA REPAIR UMBILICAL ADULT;  Surgeon: Imogene Burn. Georgette Dover, MD;  Location: [facility];  Service: General;  Laterality: N/A;    Family History  Problem Relation Age of Onset   Heart disease Maternal Grandmother    Heart disease Maternal Grandfather    Heart disease Paternal Grandmother    Heart disease Paternal Grandfather    Cancer Cousin    Hypertension Mother    Colon cancer Maternal Uncle    Rectal cancer Neg Hx    Stomach cancer Neg Hx     Social History   Socioeconomic History   Marital status: Single    Spouse name: Not on file   Number of children: Not on file   Years of education: Not  on file   Highest education level: Not on file  Occupational History   Not on file  Tobacco Use   Smoking status: Every Day    Packs/day: 0.20    Years: 25.00    Pack years: 5.00    Types: Cigarettes    Start date: 05/26/1978   Smokeless tobacco: Never   Tobacco comments:    slowing down  Vaping Use   Vaping Use: Never used  Substance and Sexual Activity   Alcohol use: Yes    Alcohol/week: 3.0 standard drinks    Types: 2 Cans of beer, 1 Standard drinks or equivalent per week    Comment: intermittent beer every few days; never more than 2 beers per sitting.   Drug use: No    Frequency: 1.0 times per week   Sexual activity: Not Currently    Birth  control/protection: None  Other Topics Concern   Not on file  Social History Narrative   Not on file   Social Determinants of Health   Financial Resource Strain: Not on file  Food Insecurity: Not on file  Transportation Needs: Not on file  Physical Activity: Not on file  Stress: Not on file  Social Connections: Not on file  Intimate Partner Violence: Not on file    Outpatient Medications Prior to Visit  Medication Sig Dispense Refill   amLODipine (NORVASC) 10 MG tablet Take 1 tablet (10 mg total) by mouth daily. 30 tablet 0   atorvastatin (LIPITOR) 40 MG tablet Take 1 tablet (40 mg total) by mouth daily. For cholesterol 90 tablet 3   gabapentin (NEURONTIN) 300 MG capsule Take 2 capsules (600 mg total) by mouth at bedtime. 60 capsule 2   hydrALAZINE (APRESOLINE) 25 MG tablet TAKE 1 TABLET BY MOUTH THREE TIMES DAILY 90 tablet 0   metoprolol succinate (TOPROL-XL) 50 MG 24 hr tablet TAKE 1 TABLET BY MOUTH ONCE DAILY. TAKE WITH OR IMMEDIATELY FOLLOWING A MEAL 30 tablet 0   Multiple Vitamin (MULTIVITAMIN WITH MINERALS) TABS tablet Take 1 tablet by mouth daily.     pantoprazole (PROTONIX) 40 MG tablet Take 1 tablet (40 mg total) by mouth daily. 30 tablet 0   sildenafil (REVATIO) 20 MG tablet TAKE 2 TO 5 TABLETS BY MOUTH ONCE DAILY AS NEEDED FOR ERECTILE DYSFUNCTION 20 tablet 0   topiramate (TOPAMAX) 50 MG tablet Take 1 tablet (50 mg total) by mouth 2 (two) times daily. (Patient taking differently: Take 50 mg by mouth every evening.) 180 tablet 1   No facility-administered medications prior to visit.    No Known Allergies  Review of Systems  Constitutional: Negative.  Negative for fatigue.  HENT: Negative.    Respiratory: Negative.    Cardiovascular: Negative.   Gastrointestinal: Negative.  Negative for abdominal pain.  Musculoskeletal:  Positive for arthralgias. Negative for joint swelling.  Skin: Negative.  Negative for rash.  All other systems reviewed and are negative.      Objective:    Physical Exam Vitals and nursing note reviewed.  Constitutional:      Appearance: Normal appearance.  HENT:     Head: Normocephalic.     Right Ear: External ear normal.     Left Ear: External ear normal.     Nose: Nose normal.  Eyes:     Conjunctiva/sclera: Conjunctivae normal.  Cardiovascular:     Rate and Rhythm: Normal rate and regular rhythm.     Heart sounds: Normal heart sounds.  Pulmonary:     Effort: Pulmonary  effort is normal.     Breath sounds: Normal breath sounds.  Abdominal:     General: Bowel sounds are normal.  Musculoskeletal:     Left foot: Tenderness and bony tenderness present.  Skin:    General: Skin is warm.     Findings: No rash.  Neurological:     Mental Status: He is alert and oriented to person, place, and time.  Psychiatric:        Behavior: Behavior normal.    BP (!) 186/115   Pulse 95   Temp (!) 97.5 F (36.4 C)   Resp 20   Ht 5\' 7"  (1.702 m)   Wt 189 lb (85.7 kg)   SpO2 95%   BMI 29.60 kg/m  Wt Readings from Last 3 Encounters:  04/29/21 189 lb (85.7 kg)  03/20/21 186 lb 3.2 oz (84.5 kg)  03/11/21 189 lb (85.7 kg)    Health Maintenance Due  Topic Date Due   OPHTHALMOLOGY EXAM  Never done   Zoster Vaccines- Shingrix (1 of 2) Never done   Pneumonia Vaccine 66+ Years old (2 - PCV) 06/27/2016   FOOT EXAM  06/12/2019   COLONOSCOPY (Pts 45-58yrs Insurance coverage will need to be confirmed)  07/22/2019   COVID-19 Vaccine (3 - Booster for Moderna series) 06/27/2020    There are no preventive care reminders to display for this patient.   Lab Results  Component Value Date   TSH 1.549 12/13/2012   Lab Results  Component Value Date   WBC 6.9 03/12/2021   HGB 15.4 03/12/2021   HCT 46.0 03/12/2021   MCV 94.8 03/12/2021   PLT 97 (L) 03/12/2021   Lab Results  Component Value Date   NA 135 03/12/2021   K 3.7 03/12/2021   CO2 26 03/12/2021   GLUCOSE 114 (H) 03/12/2021   BUN 11 03/12/2021   CREATININE 1.24  03/12/2021   BILITOT 1.0 03/10/2021   ALKPHOS 165 (H) 03/10/2021   AST 49 (H) 03/10/2021   ALT 39 03/10/2021   PROT 7.7 03/10/2021   ALBUMIN 3.8 03/10/2021   CALCIUM 8.4 (L) 03/12/2021   ANIONGAP 7 03/12/2021   Lab Results  Component Value Date   CHOL 134 03/11/2021   Lab Results  Component Value Date   HDL 61 03/11/2021   Lab Results  Component Value Date   LDLCALC 48 03/11/2021   Lab Results  Component Value Date   TRIG 126 03/11/2021   Lab Results  Component Value Date   CHOLHDL 2.2 03/11/2021   Lab Results  Component Value Date   HGBA1C 5.9 (H) 03/11/2021       Assessment & Plan:   Problem List Items Addressed This Visit       Musculoskeletal and Integument   Calcaneal spur of left foot - Primary    Patient reporting worsening pain and discomfort to left heel in the past few weeks.  Completed x-ray, results pending.  Anti-inflammatory [ibuprofen 600 mg tablet by mouth for pain].  Warm compress or ice as tolerated.  Education provided to patient printed handouts given.  If symptoms are not resolved patient may need to see orthopedic or foot doctor.  Patient's feet is flat and he may be needing balance at the moment patient is using a walker to help with his balance and reports the symptoms are worse when walking but pain is mild only about a 4.      Relevant Medications   ibuprofen (ADVIL) 600 MG tablet   Other  Relevant Orders   DG Foot Complete Left     Meds ordered this encounter  Medications   ibuprofen (ADVIL) 600 MG tablet    Sig: Take 1 tablet (600 mg total) by mouth every 8 (eight) hours as needed.    Dispense:  30 tablet    Refill:  0    Order Specific Question:   Supervising Provider    Answer:   [name] [[id number]]     Ivy Lynn, NP

## 2021-05-01 ENCOUNTER — Other Ambulatory Visit: Payer: Self-pay | Admitting: *Deleted

## 2021-05-10 ENCOUNTER — Telehealth: Payer: Self-pay | Admitting: Family Medicine

## 2021-05-10 NOTE — Telephone Encounter (Signed)
Called to schedule Welcome to Medicare. Needs before 09/22/2021, please schedule if she calls back.

## 2021-05-15 ENCOUNTER — Ambulatory Visit (INDEPENDENT_AMBULATORY_CARE_PROVIDER_SITE_OTHER): Payer: Medicare Other

## 2021-05-15 ENCOUNTER — Encounter: Payer: Self-pay | Admitting: Family Medicine

## 2021-05-15 ENCOUNTER — Ambulatory Visit (INDEPENDENT_AMBULATORY_CARE_PROVIDER_SITE_OTHER): Payer: Medicare Other | Admitting: Family Medicine

## 2021-05-15 VITALS — BP 128/82 | HR 66 | Temp 97.4°F | Ht 67.0 in | Wt 187.0 lb

## 2021-05-15 DIAGNOSIS — I6930 Unspecified sequelae of cerebral infarction: Secondary | ICD-10-CM

## 2021-05-15 DIAGNOSIS — M545 Low back pain, unspecified: Secondary | ICD-10-CM | POA: Diagnosis not present

## 2021-05-15 DIAGNOSIS — M79605 Pain in left leg: Secondary | ICD-10-CM | POA: Diagnosis not present

## 2021-05-15 DIAGNOSIS — I10 Essential (primary) hypertension: Secondary | ICD-10-CM

## 2021-05-15 MED ORDER — PREDNISONE 10 MG PO TABS: ORAL_TABLET | ORAL | 0 refills | Status: DC

## 2021-05-15 NOTE — Progress Notes (Signed)
Subjective:  Patient ID: Philip [name], male    DOB: 06-11-1955  Age: 65 y.o. MRN: [id number]  CC: Hypertension   HPI DAREN YEAGLE presents for  follow-up of hypertension. Patient has no headache chest pain or shortness of breath or recent cough. Patient also denies symptoms of TIA such as focal numbness or weakness. Patient denies side effects from the new medication, hydralazine. States taking it regularly.  Moved into a new apartment 3 weeks. Didn't hurt his back. Since then has had continued back discomfort described as stiffness. Especially stiff at the left foot instep. Seems to go all the way up his left leg from the foot. Can't bend over to put shoe and sock on or wash the feet.     History Deandre has a past medical history of Chronic cholecystitis with calculus (02/04/2013), CVA (cerebral vascular accident) (09/16/2012), Diabetes mellitus without complication ([place]), Erectile dysfunction, Gallstones (01/27/2013), GERD (gastroesophageal reflux disease), H. pylori infection (09/28/2017), Headache(784.0), Hepatitis C, History of kidney stones, History of migraine, Hypertension, Hypertension, Nausea, Seizures ([place]), Short-term memory loss, Sleep apnea, Stroke ([place]), Tobacco user, and Umbilical hernia (7/82/9562).   He has a past surgical history that includes Umbilical hernia repair (N/A, 03/09/2013); Colonoscopy (06/2014); Esophagogastroduodenoscopy (egd) with propofol (N/A, 08/21/2015); Cholecystectomy (N/A, 03/09/2013); Esophagogastroduodenoscopy (egd) with propofol (N/A, 03/10/2017); and biopsy (03/10/2017).   His family history includes Cancer in his cousin; Colon cancer in his maternal uncle; Heart disease in his maternal grandfather, maternal grandmother, paternal grandfather, and paternal grandmother; Hypertension in his mother.He reports that he has been smoking cigarettes. He started smoking about 43 years ago. He has a 5.00 pack-year smoking history. He has never used smokeless tobacco. He  reports current alcohol use of about 3.0 standard drinks per week. He reports that he does not use drugs.  Current Outpatient Medications on File Prior to Visit  Medication Sig Dispense Refill   amLODipine (NORVASC) 10 MG tablet Take 1 tablet (10 mg total) by mouth daily. 30 tablet 2   atorvastatin (LIPITOR) 40 MG tablet Take 1 tablet (40 mg total) by mouth daily. For cholesterol 90 tablet 0   gabapentin (NEURONTIN) 300 MG capsule Take 2 capsules (600 mg total) by mouth at bedtime. 60 capsule 2   hydrALAZINE (APRESOLINE) 25 MG tablet TAKE 1 TABLET BY MOUTH THREE TIMES DAILY 90 tablet 0   ibuprofen (ADVIL) 600 MG tablet Take 1 tablet (600 mg total) by mouth every 8 (eight) hours as needed. 30 tablet 0   metoprolol succinate (TOPROL-XL) 50 MG 24 hr tablet TAKE 1 TABLET BY MOUTH ONCE DAILY. TAKE WITH OR IMMEDIATELY FOLLOWING A MEAL 30 tablet 2   Multiple Vitamin (MULTIVITAMIN WITH MINERALS) TABS tablet Take 1 tablet by mouth daily.     pantoprazole (PROTONIX) 40 MG tablet Take 1 tablet (40 mg total) by mouth daily. 30 tablet 2   sildenafil (REVATIO) 20 MG tablet TAKE 2 TO 5 TABLETS BY MOUTH ONCE DAILY AS NEEDED FOR ERECTILE DYSFUNCTION 20 tablet 0   topiramate (TOPAMAX) 50 MG tablet Take 1 tablet (50 mg total) by mouth 2 (two) times daily. 180 tablet 0   No current facility-administered medications on file prior to visit.    ROS Review of Systems  Constitutional:  Negative for fever.  Respiratory:  Negative for shortness of breath.   Cardiovascular:  Negative for chest pain.  Musculoskeletal:  Negative for arthralgias.  Skin:  Negative for rash.   Objective:  BP 128/82    Pulse 66  Temp (!) 97.4 F (36.3 C)    Ht 5\' 7"  (1.702 m)    Wt 187 lb (84.8 kg)    SpO2 98%    BMI 29.29 kg/m   BP Readings from Last 3 Encounters:  05/15/21 128/82  04/29/21 (!) 186/115  03/20/21 135/74    Wt Readings from Last 3 Encounters:  05/15/21 187 lb (84.8 kg)  04/29/21 189 lb (85.7 kg)  03/20/21 186  lb 3.2 oz (84.5 kg)     Physical Exam Vitals reviewed.  Constitutional:      General: He is not in acute distress.    Appearance: He is well-developed.  HENT:     Head: Normocephalic.  Eyes:     Pupils: Pupils are equal, round, and reactive to light.  Cardiovascular:     Rate and Rhythm: Normal rate and regular rhythm.     Heart sounds: Normal heart sounds. No murmur heard. Pulmonary:     Effort: Pulmonary effort is normal.     Breath sounds: Normal breath sounds.  Abdominal:     Tenderness: There is no abdominal tenderness.  Musculoskeletal:        General: Tenderness (left lumbar approx L3-4) present.     Cervical back: Normal range of motion.  Skin:    General: Skin is warm and dry.  Neurological:     Mental Status: He is alert and oriented to person, place, and time.     Deep Tendon Reflexes: Reflexes are normal and symmetric.  Psychiatric:        Behavior: Behavior normal.        Thought Content: Thought content normal.      Assessment & Plan:   Akashdeep was seen today for hypertension.  Diagnoses and all orders for this visit:  Lumbar pain with radiation down left leg -     DG Lumbar Spine 2-3 Views; Future  Primary hypertension  Other orders -     predniSONE (DELTASONE) 10 MG tablet; Take 5 daily for 3 days followed by 4,3,2 and 1 for 3 days each.   Allergies as of 05/15/2021   No Known Allergies      Medication List        Accurate as of May 15, 2021  1:39 PM. If you have any questions, ask your nurse or doctor.          amLODipine 10 MG tablet Commonly known as: NORVASC Take 1 tablet (10 mg total) by mouth daily.   atorvastatin 40 MG tablet Commonly known as: LIPITOR Take 1 tablet (40 mg total) by mouth daily. For cholesterol   gabapentin 300 MG capsule Commonly known as: NEURONTIN Take 2 capsules (600 mg total) by mouth at bedtime.   hydrALAZINE 25 MG tablet Commonly known as: APRESOLINE TAKE 1 TABLET BY MOUTH THREE TIMES  DAILY   ibuprofen 600 MG tablet Commonly known as: ADVIL Take 1 tablet (600 mg total) by mouth every 8 (eight) hours as needed.   metoprolol succinate 50 MG 24 hr tablet Commonly known as: TOPROL-XL TAKE 1 TABLET BY MOUTH ONCE DAILY. TAKE WITH OR IMMEDIATELY FOLLOWING A MEAL   multivitamin with minerals Tabs tablet Take 1 tablet by mouth daily.   pantoprazole 40 MG tablet Commonly known as: PROTONIX Take 1 tablet (40 mg total) by mouth daily.   predniSONE 10 MG tablet Commonly known as: DELTASONE Take 5 daily for 3 days followed by 4,3,2 and 1 for 3 days each. Started by: [name], MD  sildenafil 20 MG tablet Commonly known as: REVATIO TAKE 2 TO 5 TABLETS BY MOUTH ONCE DAILY AS NEEDED FOR ERECTILE DYSFUNCTION   topiramate 50 MG tablet Commonly known as: Topamax Take 1 tablet (50 mg total) by mouth 2 (two) times daily.        Meds ordered this encounter  Medications   predniSONE (DELTASONE) 10 MG tablet    Sig: Take 5 daily for 3 days followed by 4,3,2 and 1 for 3 days each.    Dispense:  45 tablet    Refill:  0    Back exercise handout given. Perform BID.  Follow-up: Return in about 6 weeks (around 06/26/2021).  [name], M.D.

## 2021-05-15 NOTE — Patient Instructions (Signed)

## 2021-05-23 ENCOUNTER — Other Ambulatory Visit: Payer: Self-pay | Admitting: Family Medicine

## 2021-05-23 DIAGNOSIS — N529 Male erectile dysfunction, unspecified: Secondary | ICD-10-CM

## 2021-05-28 ENCOUNTER — Other Ambulatory Visit: Payer: Self-pay | Admitting: Family Medicine

## 2021-05-28 DIAGNOSIS — G8929 Other chronic pain: Secondary | ICD-10-CM

## 2021-05-28 DIAGNOSIS — R519 Headache, unspecified: Secondary | ICD-10-CM

## 2021-05-29 ENCOUNTER — Telehealth: Payer: Self-pay | Admitting: Family Medicine

## 2021-05-29 NOTE — Telephone Encounter (Signed)
(  Key: [id number])   [employer] has received your inquiry and will respond shortly with next steps. A prior authorization will not be started until you have submitted the required question set. You may close this dialog to return to your dashboard and perform other tasks. If you need assistance, please chat with CoverMyMeds or call us at [phone]. If this is an urgent request, please contact [employer] at [phone], for [place] members call [phone]. All other inquiries will be responded to within 24-72 hours.

## 2021-05-31 NOTE — Telephone Encounter (Signed)
Sent to plan today  

## 2021-06-09 ENCOUNTER — Other Ambulatory Visit: Payer: Self-pay | Admitting: Family Medicine

## 2021-06-09 DIAGNOSIS — G8929 Other chronic pain: Secondary | ICD-10-CM

## 2021-06-09 DIAGNOSIS — R519 Headache, unspecified: Secondary | ICD-10-CM

## 2021-06-09 DIAGNOSIS — I10 Essential (primary) hypertension: Secondary | ICD-10-CM

## 2021-06-10 NOTE — Telephone Encounter (Signed)
Resent today.

## 2021-06-25 ENCOUNTER — Other Ambulatory Visit: Payer: Self-pay | Admitting: Family Medicine

## 2021-06-25 DIAGNOSIS — N529 Male erectile dysfunction, unspecified: Secondary | ICD-10-CM

## 2021-06-25 NOTE — Telephone Encounter (Signed)
Last office visit 05/15/21 Last refill 05/23/21, #20, no refills

## 2021-07-09 ENCOUNTER — Other Ambulatory Visit: Payer: Self-pay | Admitting: Family Medicine

## 2021-07-09 DIAGNOSIS — I10 Essential (primary) hypertension: Secondary | ICD-10-CM

## 2021-07-23 ENCOUNTER — Other Ambulatory Visit: Payer: Self-pay | Admitting: Family Medicine

## 2021-07-23 DIAGNOSIS — N529 Male erectile dysfunction, unspecified: Secondary | ICD-10-CM

## 2021-08-04 ENCOUNTER — Other Ambulatory Visit: Payer: Self-pay | Admitting: Family Medicine

## 2021-08-04 DIAGNOSIS — N529 Male erectile dysfunction, unspecified: Secondary | ICD-10-CM

## 2021-08-09 ENCOUNTER — Other Ambulatory Visit: Payer: Self-pay | Admitting: *Deleted

## 2021-08-09 DIAGNOSIS — I10 Essential (primary) hypertension: Secondary | ICD-10-CM

## 2021-08-09 MED ORDER — AMLODIPINE BESYLATE 10 MG PO TABS: 10.0000 mg | ORAL_TABLET | Freq: Every day | ORAL | 2 refills | Status: DC

## 2021-08-30 ENCOUNTER — Other Ambulatory Visit: Payer: Self-pay | Admitting: Family Medicine

## 2021-08-30 DIAGNOSIS — N529 Male erectile dysfunction, unspecified: Secondary | ICD-10-CM

## 2021-09-06 ENCOUNTER — Other Ambulatory Visit: Payer: Self-pay | Admitting: Family Medicine

## 2021-09-06 DIAGNOSIS — I10 Essential (primary) hypertension: Secondary | ICD-10-CM

## 2021-09-25 ENCOUNTER — Other Ambulatory Visit: Payer: Self-pay | Admitting: Family Medicine

## 2021-09-25 DIAGNOSIS — N529 Male erectile dysfunction, unspecified: Secondary | ICD-10-CM

## 2021-09-29 ENCOUNTER — Other Ambulatory Visit: Payer: Self-pay | Admitting: Family Medicine

## 2021-09-29 DIAGNOSIS — E785 Hyperlipidemia, unspecified: Secondary | ICD-10-CM

## 2021-10-09 ENCOUNTER — Other Ambulatory Visit: Payer: Self-pay | Admitting: Family Medicine

## 2021-10-09 DIAGNOSIS — I10 Essential (primary) hypertension: Secondary | ICD-10-CM

## 2021-10-10 ENCOUNTER — Encounter: Payer: Self-pay | Admitting: Family Medicine

## 2021-10-10 NOTE — Telephone Encounter (Signed)
Stacks. NTBS 30 days given 09/09/21

## 2021-10-10 NOTE — Telephone Encounter (Signed)
CALLED PT TO SCHEDULE APPT, LMTCB TO MAKE APPT LETTER MAILED

## 2021-10-14 ENCOUNTER — Telehealth: Payer: Self-pay | Admitting: Family Medicine

## 2021-10-14 NOTE — Telephone Encounter (Signed)
  Left message for patient to call back and schedule Medicare Annual Wellness Visit (AWV) to be completed by video or phone.  No hx of AWV eligible for AWVI per [facility] as of  09/23/2021  Please schedule at anytime with [facility] --- Karle Starch  45 Minutes appointment   Any questions, please call me at [phone]

## 2021-10-24 ENCOUNTER — Ambulatory Visit (INDEPENDENT_AMBULATORY_CARE_PROVIDER_SITE_OTHER): Payer: Medicare Other | Admitting: Family Medicine

## 2021-10-24 ENCOUNTER — Encounter: Payer: Self-pay | Admitting: Family Medicine

## 2021-10-24 VITALS — BP 125/87 | HR 97 | Temp 97.9°F | Ht 67.0 in | Wt 185.8 lb

## 2021-10-24 DIAGNOSIS — R7303 Prediabetes: Secondary | ICD-10-CM

## 2021-10-24 DIAGNOSIS — E785 Hyperlipidemia, unspecified: Secondary | ICD-10-CM

## 2021-10-24 DIAGNOSIS — Z1211 Encounter for screening for malignant neoplasm of colon: Secondary | ICD-10-CM

## 2021-10-24 DIAGNOSIS — I10 Essential (primary) hypertension: Secondary | ICD-10-CM

## 2021-10-24 DIAGNOSIS — N529 Male erectile dysfunction, unspecified: Secondary | ICD-10-CM | POA: Diagnosis not present

## 2021-10-24 LAB — BAYER DCA HB A1C WAIVED: HB A1C (BAYER DCA - WAIVED): 5.7 % — ABNORMAL HIGH (ref 4.8–5.6)

## 2021-10-24 MED ORDER — SILDENAFIL CITRATE 20 MG PO TABS: ORAL_TABLET | ORAL | 3 refills | Status: DC

## 2021-10-24 NOTE — Progress Notes (Signed)
Subjective:  Patient ID: Philip [name], male    DOB: August 01, 1955  Age: 66 y.o. MRN: [id number]  CC: Medical Management of Chronic Issues   HPI Philip [name] presents for  follow-up of hypertension. Patient has no history of headache chest pain or shortness of breath or recent cough. Patient also denies symptoms of TIA such as focal numbness or weakness. Patient denies side effects from medication. States taking it regularly. IT does cause E.D., but the sildenafil works well.   in for follow-up of elevated cholesterol. Doing well without complaints on current medication. Denies side effects of statin including myalgia and arthralgia and nausea. Currently no chest pain, shortness of breath or other cardiovascular related symptoms noted.  Aic was 5.9 6 mos ago. 5.7 today  History [place] has a past medical history of Chronic cholecystitis with calculus (02/04/2013), CVA (cerebral vascular accident) (09/16/2012), Diabetes mellitus without complication (HCC), Erectile dysfunction, Gallstones (01/27/2013), GERD (gastroesophageal reflux disease), H. pylori infection (09/28/2017), Headache(784.0), Hepatitis C, History of kidney stones, History of migraine, Hypertension, Hypertension, Nausea, Seizures (HCC), Short-term memory loss, Sleep apnea, Stroke (HCC), Tobacco user, and Umbilical hernia (02/04/2013).   He has a past surgical history that includes Umbilical hernia repair (N/A, 03/09/2013); Colonoscopy (06/2014); Esophagogastroduodenoscopy (egd) with propofol (N/A, 08/21/2015); Cholecystectomy (N/A, 03/09/2013); Esophagogastroduodenoscopy (egd) with propofol (N/A, 03/10/2017); and biopsy (03/10/2017).   His family history includes Cancer in his cousin; Colon cancer in his maternal uncle; Heart disease in his maternal grandfather, maternal grandmother, paternal grandfather, and paternal grandmother; Hypertension in his mother.He reports that he has been smoking cigarettes. He started smoking about 43 years ago. He  has a 5.00 pack-year smoking history. He has never used smokeless tobacco. He reports current alcohol use of about 3.0 standard drinks per week. He reports that he does not use drugs.  Current Outpatient Medications on File Prior to Visit  Medication Sig Dispense Refill   atorvastatin (LIPITOR) 40 MG tablet TAKE 1 TABLET BY MOUTH ONCE DAILY FOR CHOLESTEROL 90 tablet 0   gabapentin (NEURONTIN) 300 MG capsule Take 2 capsules (600 mg total) by mouth at bedtime. 60 capsule 2   hydrALAZINE (APRESOLINE) 25 MG tablet TAKE 1 TABLET BY MOUTH THREE TIMES DAILY (NEEDS TO BE SEEN BEFORE NEXT REFILL) 90 tablet 0   metoprolol succinate (TOPROL-XL) 50 MG 24 hr tablet TAKE 1 TABLET BY MOUTH ONCE DAILY WITH OR IMMEDIATELY FOLLOWING A MEAL. APPOINTMENT REQUIRED FOR FUTURE REFILLS 30 tablet 0   Multiple Vitamin (MULTIVITAMIN WITH MINERALS) TABS tablet Take 1 tablet by mouth daily.     pantoprazole (PROTONIX) 40 MG tablet Take 1 tablet by mouth once daily 30 tablet 2   topiramate (TOPAMAX) 50 MG tablet Take 1 tablet by mouth twice daily 180 tablet 0   amLODipine (NORVASC) 10 MG tablet Take 1 tablet (10 mg total) by mouth daily. 30 tablet 2   No current facility-administered medications on file prior to visit.    ROS Review of Systems  Constitutional:  Negative for fever.  Respiratory:  Negative for shortness of breath.   Cardiovascular:  Negative for chest pain.  Musculoskeletal:  Negative for arthralgias.  Skin:  Negative for rash.   Objective:  BP 125/87   Pulse 97   Temp 97.9 F (36.6 C)   Ht 5\' 7"  (1.702 m)   Wt 185 lb 12.8 oz (84.3 kg)   SpO2 97%   BMI 29.10 kg/m   BP Readings from Last 3 Encounters:  10/24/21 125/87  05/15/21 128/82  04/29/21 (!) 186/115    Wt Readings from Last 3 Encounters:  10/24/21 185 lb 12.8 oz (84.3 kg)  05/15/21 187 lb (84.8 kg)  04/29/21 189 lb (85.7 kg)     Physical Exam Vitals reviewed.  Constitutional:      Appearance: He is well-developed.  HENT:      Head: Normocephalic and atraumatic.     Right Ear: External ear normal.     Left Ear: External ear normal.     Mouth/Throat:     Pharynx: No oropharyngeal exudate or posterior oropharyngeal erythema.  Eyes:     Pupils: Pupils are equal, round, and reactive to light.  Cardiovascular:     Rate and Rhythm: Normal rate and regular rhythm.     Heart sounds: No murmur heard. Pulmonary:     Effort: No respiratory distress.     Breath sounds: Normal breath sounds.  Musculoskeletal:     Cervical back: Normal range of motion and neck supple.  Neurological:     Mental Status: He is alert and oriented to person, place, and time.      Assessment & Plan:   Philip [name] was seen today for medical management of chronic issues.  Diagnoses and all orders for this visit:  Prediabetes -     Bayer DCA Hb A1c Waived  Primary hypertension -     CBC with Differential/Platelet -     CMP14+EGFR  Hyperlipidemia, unspecified hyperlipidemia type -     Lipid panel  Erectile dysfunction, unspecified erectile dysfunction type -     sildenafil (REVATIO) 20 MG tablet; TAKE 2 TO 5 TABLETS BY MOUTH ONCE DAILY AS NEEDED FOR ERECTILE DYSFUNCTION  Screen for colon cancer -     Ambulatory referral to Gastroenterology   Allergies as of 10/24/2021   No Known Allergies      Medication List        Accurate as of October 24, 2021 10:51 AM. If you have any questions, ask your nurse or doctor.          STOP taking these medications    ibuprofen 600 MG tablet Commonly known as: ADVIL Stopped by: [name], MD   predniSONE 10 MG tablet Commonly known as: DELTASONE Stopped by: [name], MD       TAKE these medications    amLODipine 10 MG tablet Commonly known as: NORVASC Take 1 tablet (10 mg total) by mouth daily.   atorvastatin 40 MG tablet Commonly known as: LIPITOR TAKE 1 TABLET BY MOUTH ONCE DAILY FOR CHOLESTEROL   gabapentin 300 MG capsule Commonly known as: NEURONTIN Take 2  capsules (600 mg total) by mouth at bedtime.   hydrALAZINE 25 MG tablet Commonly known as: APRESOLINE TAKE 1 TABLET BY MOUTH THREE TIMES DAILY (NEEDS TO BE SEEN BEFORE NEXT REFILL)   metoprolol succinate 50 MG 24 hr tablet Commonly known as: TOPROL-XL TAKE 1 TABLET BY MOUTH ONCE DAILY WITH OR IMMEDIATELY FOLLOWING A MEAL. APPOINTMENT REQUIRED FOR FUTURE REFILLS   multivitamin with minerals Tabs tablet Take 1 tablet by mouth daily.   pantoprazole 40 MG tablet Commonly known as: PROTONIX Take 1 tablet by mouth once daily   sildenafil 20 MG tablet Commonly known as: REVATIO TAKE 2 TO 5 TABLETS BY MOUTH ONCE DAILY AS NEEDED FOR ERECTILE DYSFUNCTION   topiramate 50 MG tablet Commonly known as: TOPAMAX Take 1 tablet by mouth twice daily        Meds ordered this encounter  Medications   sildenafil (REVATIO)  20 MG tablet    Sig: TAKE 2 TO 5 TABLETS BY MOUTH ONCE DAILY AS NEEDED FOR ERECTILE DYSFUNCTION    Dispense:  50 tablet    Refill:  3      Follow-up: Return in about 6 months (around 04/25/2022) for [facility] physical.  [name], M.D.

## 2021-10-25 LAB — CMP14+EGFR
ALT: 23 IU/L (ref 0–44)
AST: 28 IU/L (ref 0–40)
Albumin/Globulin Ratio: 1.6 (ref 1.2–2.2)
Albumin: 4.2 g/dL (ref 3.8–4.8)
Alkaline Phosphatase: 140 IU/L — ABNORMAL HIGH (ref 44–121)
BUN/Creatinine Ratio: 5 — ABNORMAL LOW (ref 10–24)
BUN: 6 mg/dL — ABNORMAL LOW (ref 8–27)
Bilirubin Total: 0.8 mg/dL (ref 0.0–1.2)
CO2: 21 mmol/L (ref 20–29)
Calcium: 9 mg/dL (ref 8.6–10.2)
Chloride: 98 mmol/L (ref 96–106)
Creatinine, Ser: 1.28 mg/dL — ABNORMAL HIGH (ref 0.76–1.27)
Globulin, Total: 2.6 g/dL (ref 1.5–4.5)
Glucose: 143 mg/dL — ABNORMAL HIGH (ref 70–99)
Potassium: 3.8 mmol/L (ref 3.5–5.2)
Sodium: 136 mmol/L (ref 134–144)
Total Protein: 6.8 g/dL (ref 6.0–8.5)
eGFR: 62 mL/min/{1.73_m2} (ref >59)

## 2021-10-25 LAB — LIPID PANEL
Chol/HDL Ratio: 2 ratio (ref 0.0–5.0)
Cholesterol, Total: 139 mg/dL (ref 100–199)
HDL: 71 mg/dL (ref >39)
LDL Chol Calc (NIH): 47 mg/dL (ref 0–99)
Triglycerides: 124 mg/dL (ref 0–149)
VLDL Cholesterol Cal: 21 mg/dL (ref 5–40)

## 2021-10-25 LAB — CBC WITH DIFFERENTIAL/PLATELET
Basophils Absolute: 0 10*3/uL (ref 0.0–0.2)
Basos: 1 %
EOS (ABSOLUTE): 0.2 10*3/uL (ref 0.0–0.4)
Eos: 5 %
Hematocrit: 44.3 % (ref 37.5–51.0)
Hemoglobin: 15.9 g/dL (ref 13.0–17.7)
Immature Grans (Abs): 0 10*3/uL (ref 0.0–0.1)
Immature Granulocytes: 0 %
Lymphocytes Absolute: 1.6 10*3/uL (ref 0.7–3.1)
Lymphs: 32 %
MCH: 30.6 pg (ref 26.6–33.0)
MCHC: 35.9 g/dL — ABNORMAL HIGH (ref 31.5–35.7)
MCV: 85 fL (ref 79–97)
Monocytes Absolute: 0.4 10*3/uL (ref 0.1–0.9)
Monocytes: 9 %
Neutrophils Absolute: 2.6 10*3/uL (ref 1.4–7.0)
Neutrophils: 53 %
Platelets: 111 10*3/uL — ABNORMAL LOW (ref 150–450)
RBC: 5.19 x10E6/uL (ref 4.14–5.80)
RDW: 12.5 % (ref 11.6–15.4)
WBC: 4.9 10*3/uL (ref 3.4–10.8)

## 2021-10-28 NOTE — Progress Notes (Signed)
Hello Burton,  Your lab result is normal and/or stable.Some minor variations that are not significant are commonly marked abnormal, but do not represent any medical problem for you.  Best regards, Ferdie Bakken, M.D.

## 2021-11-05 ENCOUNTER — Other Ambulatory Visit: Payer: Self-pay | Admitting: Family Medicine

## 2021-11-05 DIAGNOSIS — G8929 Other chronic pain: Secondary | ICD-10-CM

## 2021-11-14 ENCOUNTER — Telehealth: Payer: Self-pay | Admitting: Family Medicine

## 2021-11-14 NOTE — Telephone Encounter (Signed)
  Left message for patient to call back and schedule Medicare Annual Wellness Visit (AWV) to be completed by video or phone.  No hx of AWV eligible for AWVI per [facility] as of 09/23/2021  Please schedule at anytime with [place]  45 Minutes appointment   Any questions, please call me at [phone]

## 2021-11-21 ENCOUNTER — Ambulatory Visit: Payer: Medicare Other

## 2021-12-03 ENCOUNTER — Ambulatory Visit (INDEPENDENT_AMBULATORY_CARE_PROVIDER_SITE_OTHER): Payer: Medicare Other

## 2021-12-03 VITALS — Wt 186.0 lb

## 2021-12-03 DIAGNOSIS — Z0000 Encounter for general adult medical examination without abnormal findings: Secondary | ICD-10-CM

## 2021-12-03 NOTE — Progress Notes (Signed)
Subjective:   Philip [name] is a 66 y.o. male who presents for an Initial Medicare Annual Wellness Visit.  Virtual Visit via Telephone Note  I connected with  Philip [name] on 12/03/21 at  2:45 PM EDT by telephone and verified that I am speaking with the correct person using two identifiers.  Location: Patient: Home Provider: [facility] Persons participating in the virtual visit: patient/Nurse Health Advisor   I discussed the limitations, risks, security and privacy concerns of performing an evaluation and management service by telephone and the availability of in person appointments. The patient expressed understanding and agreed to proceed.  Interactive audio and video telecommunications were attempted between this nurse and patient, however failed, due to patient having technical difficulties OR patient did not have access to video capability.  We continued and completed visit with audio only.  Some vital signs may be absent or patient reported.   Philip Abed E Tnia Anglada, LPN   Review of Systems     Cardiac Risk Factors include: advanced age (>19mn, >>63women);dyslipidemia;hypertension;male gender;sedentary lifestyle;smoking/ tobacco exposure;Other (see comment), Risk factor comments: daily alcohol use, OSA not using CPAP, cirrhosis, h/o stroke     Objective:    Today's Vitals   12/03/21 1450  Weight: 186 lb (84.4 kg)   Body mass index is 29.13 kg/m.     12/03/2021    3:08 PM 03/10/2021    1:19 PM 03/10/2017    6:35 AM 03/06/2017   10:15 AM 03/26/2016    1:20 PM 08/20/2015   11:25 AM 07/07/2014    1:39 PM  Advanced Directives  Does Patient Have a Medical Advance Directive? Yes No No No No No No  Type of AParamedicof ALofallLiving will        Copy of [place]? No - copy requested        Would patient like information on creating a medical advance directive?  No - Patient declined No - Patient declined No - Patient declined No -  patient declined information No - patient declined information     Current Medications (verified) Outpatient Encounter Medications as of 12/03/2021  Medication Sig   atorvastatin (LIPITOR) 40 MG tablet TAKE 1 TABLET BY MOUTH ONCE DAILY FOR CHOLESTEROL   gabapentin (NEURONTIN) 300 MG capsule TAKE 2 CAPSULES BY MOUTH AT BEDTIME   hydrALAZINE (APRESOLINE) 25 MG tablet TAKE 1 TABLET BY MOUTH THREE TIMES DAILY (NEEDS TO BE SEEN BEFORE NEXT REFILL)   metoprolol succinate (TOPROL-XL) 50 MG 24 hr tablet TAKE 1 TABLET BY MOUTH ONCE DAILY WITH OR IMMEDIATELY FOLLOWING A MEAL. APPOINTMENT REQUIRED FOR FUTURE REFILLS   Multiple Vitamin (MULTIVITAMIN WITH MINERALS) TABS tablet Take 1 tablet by mouth daily.   pantoprazole (PROTONIX) 40 MG tablet Take 1 tablet by mouth once daily   sildenafil (REVATIO) 20 MG tablet TAKE 2 TO 5 TABLETS BY MOUTH ONCE DAILY AS NEEDED FOR ERECTILE DYSFUNCTION   topiramate (TOPAMAX) 50 MG tablet Take 1 tablet by mouth twice daily   amLODipine (NORVASC) 10 MG tablet Take 1 tablet (10 mg total) by mouth daily.   No facility-administered encounter medications on file as of 12/03/2021.    Allergies (verified) Patient has no known allergies.   History: Past Medical History:  Diagnosis Date   Chronic cholecystitis with calculus 02/04/2013   CVA (cerebral vascular accident) 09/16/2012   Diabetes mellitus without complication ([place]    borderline   Erectile dysfunction    Gallstones 01/27/2013  GERD (gastroesophageal reflux disease)    H. pylori infection 09/28/2017   Headache(784.0)    "Headache, I dont think they are migranes"   Hepatitis C    [place], completed 06/2015 (Dr. Linus [name]) , AUG 2017 SVR   History of kidney stones    history of kidney stones   History of migraine    takes Topamax daily   Hypertension    takes Hydralazine,Maxzide,Clonidine,and Amlodipine daily   Hypertension    also takes Metoprolol daily   Nausea    takes Zofran daily   Seizures (HCC)    off  Dilantin daily.  last one was when he had a stroke.   Short-term memory loss    due to stroke    Sleep apnea    CPAP- tries to do it everyday.   Stroke ([place])    .  Short term memory loss.; left sided weakness   Tobacco user    Umbilical hernia 11/24/6376   Past Surgical History:  Procedure Laterality Date   BIOPSY  03/10/2017   Procedure: BIOPSY;  Surgeon: Philip Binder, MD;  [facility];  Service: Endoscopy;;  gastric biopsy    CHOLECYSTECTOMY N/A 03/09/2013   Procedure: LAPAROSCOPIC CHOLECYSTECTOMY WITH INTRAOPERATIVE CHOLANGIOGRAM;  Surgeon: Philip Burn. Georgette Dover, MD;  Location: [facility];  Service: General;  Laterality: N/A;   COLONOSCOPY  06/2014   Dr. Olevia [name]: Three sessile polyps were found in the rectum x2 and descending. tubular adenomas. colonoscopy in 06/2019   ESOPHAGOGASTRODUODENOSCOPY (EGD) WITH PROPOFOL N/A 08/21/2015   Dr. Oneida [name]: normal esophagus, non-bleeding gastric ulcers/gastritis (H.pylori)    ESOPHAGOGASTRODUODENOSCOPY (EGD) WITH PROPOFOL N/A 03/10/2017   Procedure: ESOPHAGOGASTRODUODENOSCOPY (EGD) WITH PROPOFOL;  Surgeon: Philip Binder, MD;  [facility];  Service: Endoscopy;  Laterality: N/A;  1:00pm-rescheduled to 10\16 @ 5:88FO    UMBILICAL HERNIA REPAIR N/A 03/09/2013   Procedure: HERNIA REPAIR UMBILICAL ADULT;  Surgeon: Philip Burn. Georgette Dover, MD;  Location: [facility];  Service: General;  Laterality: N/A;   Family History  Problem Relation Age of Onset   Heart disease Maternal Grandmother    Heart disease Maternal Grandfather    Heart disease Paternal Grandmother    Heart disease Paternal Grandfather    Cancer Cousin    Hypertension Mother    Colon cancer Maternal Uncle    Rectal cancer Neg Hx    Stomach cancer Neg Hx    Social History   Socioeconomic History   Marital status: Single    Spouse name: Not on file   Number of children: 1   Years of education: Not on file   Highest education level: Not on file  Occupational History    Occupation: retired  Tobacco Use   Smoking status: Every Day    Packs/day: 0.20    Years: 25.00    Total pack years: 5.00    Types: Cigarettes    Start date: 05/26/1978   Smokeless tobacco: Never   Tobacco comments:    slowing down  Vaping Use   Vaping Use: Never used  Substance and Sexual Activity   Alcohol use: Yes    Alcohol/week: 7.0 standard drinks of alcohol    Types: 6 Cans of beer, 1 Standard drinks or equivalent per week    Comment: drinks daily - usually two 40oz beers per day   Drug use: No    Frequency: 1.0 times per week   Sexual activity: Not Currently    Birth control/protection: None  Other Topics Concern  Not on file  Social History Narrative   Daughter lives in [place] Determinants of Health   Financial Resource Strain: Low Risk  (12/03/2021)   Overall Financial Resource Strain (CARDIA)    Difficulty of Paying Living Expenses: Not hard at all  Food Insecurity: No Food Insecurity (12/03/2021)   Hunger Vital Sign    Worried About Running Out of Food in the Last Year: Never true    Ran Out of Food in the Last Year: Never true  Transportation Needs: No Transportation Needs (12/03/2021)   PRAPARE - Hydrologist (Medical): No    Lack of Transportation (Non-Medical): No  Physical Activity: Insufficiently Active (12/03/2021)   Exercise Vital Sign    Days of Exercise per Week: 4 days    Minutes of Exercise per Session: 30 min  Stress: No Stress Concern Present (12/03/2021)   [place]    Feeling of Stress : Not at all  Social Connections: Moderately Isolated (12/03/2021)   Social Connection and Isolation Panel [NHANES]    Frequency of Communication with Friends and Family: More than three times a week    Frequency of Social Gatherings with Friends and Family: More than three times a week    Attends Religious Services: Never    Marine scientist or  Organizations: Yes    Attends Music therapist: More than 4 times per year    Marital Status: Divorced    Tobacco Counseling Ready to quit: Not Answered Counseling given: Not Answered Tobacco comments: slowing down   Clinical Intake:  Pre-visit preparation completed: Yes  Pain : No/denies pain     BMI - recorded: 29.13 Nutritional Status: BMI 25 -29 Overweight Nutritional Risks: None Diabetes: No  How often do you need to have someone help you when you read instructions, pamphlets, or other written materials from your doctor or pharmacy?: 1 - Never  Diabetic? no  Interpreter Needed?: No  Information entered by :: Shynia Daleo, LPN   Activities of Daily Living    12/03/2021    3:02 PM 03/11/2021    9:32 AM  In your present state of health, do you have any difficulty performing the following activities:  Hearing? 0 0  Vision? 0 0  Difficulty concentrating or making decisions? 1 0  Walking or climbing stairs? 0 0  Dressing or bathing? 0 0  Doing errands, shopping? 0 0  Preparing Food and eating ? N   Using the Toilet? N   In the past six months, have you accidently leaked urine? N   Do you have problems with loss of bowel control? N   Managing your Medications? N   Managing your Finances? N   Housekeeping or managing your Housekeeping? N     Patient Care Team: [name], MD as PCP - General (Family Medicine)  Indicate any recent Medical Services you may have received from other than [facility] providers in the past year (date may be approximate).     Assessment:   This is a routine wellness examination for Philip [name].  Hearing/Vision screen Hearing Screening - Comments:: Denies hearing difficulties   Vision Screening - Comments:: Wears rx glasses - behind with routine eye exams with optometrist in [place] - forgot name  Dietary issues and exercise activities discussed: Current Exercise Habits: Home exercise routine, Type of exercise: walking, Time  (Minutes): 30, Frequency (Times/Week): 4, Weekly Exercise (Minutes/Week): 120, Intensity: Mild, Exercise  limited by: orthopedic condition(s)   Goals Addressed             This Visit's Progress    Quit Smoking       If you wish to quit smoking, help is available. For free tobacco cessation program offerings call the [facility] at 435-879-3302 or Live Well Line at 914-364-2390. You may also visit [email or web address] or email livelifewell'@[place]'$ .com for more information on other programs.   You may also call 1-800-QUIT-NOW 979-492-1848) or visit www.VirusCrisis.dk or www.BecomeAnEx.org for additional resources on smoking cessation.       Reduce alcohol intake         Depression Screen    12/03/2021    2:55 PM 10/24/2021   10:00 AM 05/15/2021    1:00 PM 05/15/2021   12:50 PM 04/29/2021    2:49 PM 03/20/2021    1:50 PM 01/09/2020    4:10 PM  PHQ 2/9 Scores  PHQ - 2 Score 0 0 0 0 0 0 0  PHQ- 9 Score   6   6     Fall Risk    12/03/2021    2:53 PM 10/24/2021   10:00 AM 05/15/2021   12:50 PM 04/29/2021    2:49 PM 03/20/2021    1:50 PM  [place] in the past year? 0 0 '1 1 1  '$ Number falls in past yr: 0  '1 1 1  '$ Injury with Fall? 0  0 0 0  Risk for fall due to : Orthopedic patient;Other (Comment)  History of fall(s) History of fall(s) History of fall(s)  Risk for fall due to: Comment daily alcohol use      Follow up Falls prevention discussed  Falls evaluation completed Falls evaluation completed Falls evaluation completed    [place]:  Any stairs in or around the home? Yes  If so, are there any without handrails? No  Home free of loose throw rugs in walkways, pet beds, electrical cords, etc? Yes  Adequate lighting in your home to reduce risk of falls? Yes   ASSISTIVE DEVICES UTILIZED TO PREVENT FALLS:  Life alert? No  Use of a cane, walker or w/c? No  Grab bars in the bathroom? No  Shower chair or bench in  shower? No  Elevated toilet seat or a handicapped toilet? No   TIMED UP AND GO:  Was the test performed? No . Telephonic visit  Cognitive Function:        12/03/2021    2:56 PM  6CIT Screen  What Year? 0 points  What month? 0 points  What time? 0 points  Count back from 20 0 points  Months in reverse 4 points  Repeat phrase 6 points  Total Score 10 points    Immunizations Immunization History  Administered Date(s) Administered   Hepatitis B, adult 06/28/2015, 08/03/2015, 12/25/2015   Moderna SARS-COV2 Booster Vaccination 05/02/2020   Moderna Sars-Covid-2 Vaccination 09/08/2019, 10/11/2019   Pneumococcal Polysaccharide-23 06/28/2015    TDAP status: Due, Education has been provided regarding the importance of this vaccine. Advised may receive this vaccine at local pharmacy or Health Dept. Aware to provide a copy of the vaccination record if obtained from local pharmacy or Health Dept. Verbalized acceptance and understanding.  Flu Vaccine status: Declined, Education has been provided regarding the importance of this vaccine but patient still declined. Advised may receive this vaccine at local pharmacy or Health Dept. Aware to provide a  copy of the vaccination record if obtained from local pharmacy or Health Dept. Verbalized acceptance and understanding.  Pneumococcal vaccine status: Due, Education has been provided regarding the importance of this vaccine. Advised may receive this vaccine at local pharmacy or Health Dept. Aware to provide a copy of the vaccination record if obtained from local pharmacy or Health Dept. Verbalized acceptance and understanding.  Covid-19 vaccine status: Completed vaccines  Qualifies for Shingles Vaccine? Yes   Zostavax completed No   Shingrix Completed?: No.    Education has been provided regarding the importance of this vaccine. Patient has been advised to call insurance company to determine out of pocket expense if they have not yet received this  vaccine. Advised may also receive vaccine at local pharmacy or Health Dept. Verbalized acceptance and understanding.  Screening Tests Health Maintenance  Topic Date Due   COLONOSCOPY (Pts 45-35yr Insurance coverage will need to be confirmed)  07/22/2019   COVID-19 Vaccine (3 - Moderna series) 06/27/2020   Zoster Vaccines- Shingrix (1 of 2) 01/24/2022 (Originally 10/21/2005)   Pneumonia Vaccine 66 Years old (2 - PCV) 10/25/2022 (Originally 06/27/2016)   TETANUS/TDAP  12/14/2022   Hepatitis C Screening  Completed   HPV VACCINES  Aged Out   INFLUENZA VACCINE  Discontinued    Health Maintenance  Health Maintenance Due  Topic Date Due   COLONOSCOPY (Pts 45-424yrInsurance coverage will need to be confirmed)  07/22/2019   COVID-19 Vaccine (3 - Moderna series) 06/27/2020    Colorectal cancer screening: Referral to GI placed 10/2021. Pt aware the office will call re: appt.  Lung Cancer Screening: (Low Dose CT Chest recommended if Age 66-80ears, 30 pack-year currently smoking OR have quit w/in 15years.) does not qualify.   Additional Screening:  Hepatitis C Screening: does qualify; Completed 03/18/2017  Vision Screening: Recommended annual ophthalmology exams for early detection of glaucoma and other disorders of the eye. Is the patient up to date with their annual eye exam?  No  Who is the provider or what is the name of the office in which the patient attends annual eye exams? [place] If pt is not established with a provider, would they like to be referred to a provider to establish care? No .   Dental Screening: Recommended annual dental exams for proper oral hygiene  Community Resource Referral / Chronic Care Management: CRR required this visit?  No   CCM required this visit?  No      Plan:     I have personally reviewed and noted the following in the patient's chart:   Medical and social history Use of alcohol, tobacco or illicit drugs   Current medications and supplements including opioid prescriptions. Patient is not currently taking opioid prescriptions. Functional ability and status Nutritional status Physical activity Advanced directives List of other physicians Hospitalizations, surgeries, and ER visits in previous 12 months Vitals Screenings to include cognitive, depression, and falls Referrals and appointments  In addition, I have reviewed and discussed with patient certain preventive protocols, quality metrics, and best practice recommendations. A written personalized care plan for preventive services as well as general preventive health recommendations were provided to patient.     AmSandrea HammondLPN   11/24/58/1093 Nurse Notes: None

## 2021-12-03 NOTE — Patient Instructions (Signed)
Philip [name] , Thank you for taking time to come for your Medicare Wellness Visit. I appreciate your ongoing commitment to your health goals. Please review the following plan we discussed and let me know if I can assist you in the future.   Screening recommendations/referrals: Colonoscopy: Done 07/21/2014 - Repeat in 5 years *Dr Livia Snellen sent a referral last month Recommended yearly ophthalmology/optometry visit for glaucoma screening and checkup Recommended yearly dental visit for hygiene and checkup  Vaccinations: declines all Influenza vaccine: recommend every Fall Pneumococcal vaccine: Pneumovax done 06/28/2015; due for Prevnar-20 Tdap vaccine: recommend every 10 years Shingles vaccine: recommend Shingrix which is 2 doses 2-6 months apart and over 90% effective     Covid-19: 09/08/2019, 10/11/2019, & 05/02/2020  Advanced directives: Please bring a copy of your health care power of attorney and living will to the office to be added to your chart at your convenience.   Conditions/risks identified: Aim for 30 minutes of exercise or brisk walking, 6-8 glasses of water, and 5 servings of fruits and vegetables each day. Try to cut back on drinking alcohol - one 12 ounce beer per day, then less; If you wish to quit smoking, help is available. For free tobacco cessation program offerings call the [facility] at 763-004-6187 or Live Well Line at 2766391125. You may also visit [email or web address] or email [email or web address] for more information on other programs.   You may also call 1-800-QUIT-NOW 5182950713) or visit www.VirusCrisis.dk or www.BecomeAnEx.org for additional resources on smoking cessation.    Next appointment: Follow up in one year for your annual wellness visit.   Preventive Care 26 Years and Older, Male  Preventive care refers to lifestyle choices and visits with your health care provider that can promote health and wellness. What does preventive care  include? A yearly physical exam. This is also called an annual well check. Dental exams once or twice a year. Routine eye exams. Ask your health care provider how often you should have your eyes checked. Personal lifestyle choices, including: Daily care of your teeth and gums. Regular physical activity. Eating a healthy diet. Avoiding tobacco and drug use. Limiting alcohol use. Practicing safe sex. Taking low doses of aspirin every day. Taking vitamin and mineral supplements as recommended by your health care provider. What happens during an annual well check? The services and screenings done by your health care provider during your annual well check will depend on your age, overall health, lifestyle risk factors, and family history of disease. Counseling  Your health care provider may ask you questions about your: Alcohol use. Tobacco use. Drug use. Emotional well-being. Home and relationship well-being. Sexual activity. Eating habits. History of falls. Memory and ability to understand (cognition). Work and work Statistician. Screening  You may have the following tests or measurements: Height, weight, and BMI. Blood pressure. Lipid and cholesterol levels. These may be checked every 5 years, or more frequently if you are over 26 years old. Skin check. Lung cancer screening. You may have this screening every year starting at age 38 if you have a 30-pack-year history of smoking and currently smoke or have quit within the past 15 years. Fecal occult blood test (FOBT) of the stool. You may have this test every year starting at age 54. Flexible sigmoidoscopy or colonoscopy. You may have a sigmoidoscopy every 5 years or a colonoscopy every 10 years starting at age 1. Prostate cancer screening. Recommendations will vary depending on your family history and other risks. Hepatitis  C blood test. Hepatitis B blood test. Sexually transmitted disease (STD) testing. Diabetes screening. This  is done by checking your blood sugar (glucose) after you have not eaten for a while (fasting). You may have this done every 1-3 years. Abdominal aortic aneurysm (AAA) screening. You may need this if you are a current or former smoker. Osteoporosis. You may be screened starting at age 38 if you are at high risk. Talk with your health care provider about your test results, treatment options, and if necessary, the need for more tests. Vaccines  Your health care provider may recommend certain vaccines, such as: Influenza vaccine. This is recommended every year. Tetanus, diphtheria, and acellular pertussis (Tdap, Td) vaccine. You may need a Td booster every 10 years. Zoster vaccine. You may need this after age 8. Pneumococcal 13-valent conjugate (PCV13) vaccine. One dose is recommended after age 63. Pneumococcal polysaccharide (PPSV23) vaccine. One dose is recommended after age 81. Talk to your health care provider about which screenings and vaccines you need and how often you need them. This information is not intended to replace advice given to you by your health care provider. Make sure you discuss any questions you have with your health care provider. Document Released: 06/08/2015 Document Revised: 01/30/2016 Document Reviewed: 03/13/2015 Elsevier Interactive Patient Education  2017 [place] Prevention in the Home Falls can cause injuries. They can happen to people of all ages. There are many things you can do to make your home safe and to help prevent falls. What can I do on the outside of my home? Regularly fix the edges of walkways and driveways and fix any cracks. Remove anything that might make you trip as you walk through a door, such as a raised step or threshold. Trim any bushes or trees on the path to your home. Use bright outdoor lighting. Clear any walking paths of anything that might make someone trip, such as rocks or tools. Regularly check to see if handrails are  loose or broken. Make sure that both sides of any steps have handrails. Any raised decks and porches should have guardrails on the edges. Have any leaves, snow, or ice cleared regularly. Use sand or salt on walking paths during winter. Clean up any spills in your garage right away. This includes oil or grease spills. What can I do in the bathroom? Use night lights. Install grab bars by the toilet and in the tub and shower. Do not use towel bars as grab bars. Use non-skid mats or decals in the tub or shower. If you need to sit down in the shower, use a plastic, non-slip stool. Keep the floor dry. Clean up any water that spills on the floor as soon as it happens. Remove soap buildup in the tub or shower regularly. Attach bath mats securely with double-sided non-slip rug tape. Do not have throw rugs and other things on the floor that can make you trip. What can I do in the bedroom? Use night lights. Make sure that you have a light by your bed that is easy to reach. Do not use any sheets or blankets that are too big for your bed. They should not hang down onto the floor. Have a firm chair that has side arms. You can use this for support while you get dressed. Do not have throw rugs and other things on the floor that can make you trip. What can I do in the kitchen? Clean up any spills right away. Avoid walking  on wet floors. Keep items that you use a lot in easy-to-reach places. If you need to reach something above you, use a strong step stool that has a grab bar. Keep electrical cords out of the way. Do not use floor polish or wax that makes floors slippery. If you must use wax, use non-skid floor wax. Do not have throw rugs and other things on the floor that can make you trip. What can I do with my stairs? Do not leave any items on the stairs. Make sure that there are handrails on both sides of the stairs and use them. Fix handrails that are broken or loose. Make sure that handrails are as  long as the stairways. Check any carpeting to make sure that it is firmly attached to the stairs. Fix any carpet that is loose or worn. Avoid having throw rugs at the top or bottom of the stairs. If you do have throw rugs, attach them to the floor with carpet tape. Make sure that you have a light switch at the top of the stairs and the bottom of the stairs. If you do not have them, ask someone to add them for you. What else can I do to help prevent falls? Wear shoes that: Do not have high heels. Have rubber bottoms. Are comfortable and fit you well. Are closed at the toe. Do not wear sandals. If you use a stepladder: Make sure that it is fully opened. Do not climb a closed stepladder. Make sure that both sides of the stepladder are locked into place. Ask someone to hold it for you, if possible. Clearly mark and make sure that you can see: Any grab bars or handrails. First and last steps. Where the edge of each step is. Use tools that help you move around (mobility aids) if they are needed. These include: Canes. Walkers. Scooters. Crutches. Turn on the lights when you go into a dark area. Replace any light bulbs as soon as they burn out. Set up your furniture so you have a clear path. Avoid moving your furniture around. If any of your floors are uneven, fix them. If there are any pets around you, be aware of where they are. Review your medicines with your doctor. Some medicines can make you feel dizzy. This can increase your chance of falling. Ask your doctor what other things that you can do to help prevent falls. This information is not intended to replace advice given to you by your health care provider. Make sure you discuss any questions you have with your health care provider. Document Released: 03/08/2009 Document Revised: 10/18/2015 Document Reviewed: 06/16/2014 Elsevier Interactive Patient Education  2017 [employer].

## 2022-01-08 ENCOUNTER — Other Ambulatory Visit: Payer: Self-pay | Admitting: Family Medicine

## 2022-01-08 DIAGNOSIS — N529 Male erectile dysfunction, unspecified: Secondary | ICD-10-CM

## 2022-10-07 ENCOUNTER — Other Ambulatory Visit: Payer: Self-pay | Admitting: Family Medicine

## 2022-10-07 DIAGNOSIS — N529 Male erectile dysfunction, unspecified: Secondary | ICD-10-CM

## 2022-10-07 NOTE — Telephone Encounter (Signed)
Appt made for 6/17 with PCP

## 2022-10-07 NOTE — Telephone Encounter (Signed)
Refill denied, pt needs appt with PCP

## 2022-10-13 ENCOUNTER — Other Ambulatory Visit: Payer: Self-pay | Admitting: Family Medicine

## 2022-10-13 DIAGNOSIS — N529 Male erectile dysfunction, unspecified: Secondary | ICD-10-CM

## 2022-11-10 ENCOUNTER — Ambulatory Visit: Payer: Medicare Other | Admitting: Family Medicine

## 2022-11-17 ENCOUNTER — Encounter: Payer: Self-pay | Admitting: Family Medicine

## 2022-11-17 ENCOUNTER — Ambulatory Visit (INDEPENDENT_AMBULATORY_CARE_PROVIDER_SITE_OTHER): Payer: Medicare Other | Admitting: Family Medicine

## 2022-11-17 VITALS — BP 189/112 | HR 82 | Temp 98.1°F | Ht 67.0 in | Wt 181.0 lb

## 2022-11-17 DIAGNOSIS — R7303 Prediabetes: Secondary | ICD-10-CM | POA: Diagnosis not present

## 2022-11-17 DIAGNOSIS — I10 Essential (primary) hypertension: Secondary | ICD-10-CM

## 2022-11-17 DIAGNOSIS — E785 Hyperlipidemia, unspecified: Secondary | ICD-10-CM | POA: Diagnosis not present

## 2022-11-17 DIAGNOSIS — G8929 Other chronic pain: Secondary | ICD-10-CM

## 2022-11-17 DIAGNOSIS — R519 Headache, unspecified: Secondary | ICD-10-CM | POA: Diagnosis not present

## 2022-11-17 LAB — BAYER DCA HB A1C WAIVED: HB A1C (BAYER DCA - WAIVED): 5.1 % (ref 4.8–5.6)

## 2022-11-17 MED ORDER — GABAPENTIN 300 MG PO CAPS: 600.0000 mg | ORAL_CAPSULE | Freq: Every day | ORAL | 3 refills | Status: DC

## 2022-11-17 MED ORDER — AMLODIPINE BESYLATE 10 MG PO TABS: 10.0000 mg | ORAL_TABLET | Freq: Every day | ORAL | 0 refills | Status: DC

## 2022-11-17 MED ORDER — METOPROLOL SUCCINATE ER 50 MG PO TB24
ORAL_TABLET | ORAL | 1 refills | Status: DC
Start: 2022-11-17 — End: 2023-12-17

## 2022-11-17 MED ORDER — PANTOPRAZOLE SODIUM 40 MG PO TBEC: 40.0000 mg | DELAYED_RELEASE_TABLET | Freq: Every day | ORAL | 3 refills | Status: DC

## 2022-11-17 MED ORDER — HYDRALAZINE HCL 25 MG PO TABS: ORAL_TABLET | ORAL | 0 refills | Status: DC

## 2022-11-17 MED ORDER — ATORVASTATIN CALCIUM 40 MG PO TABS: 40.0000 mg | ORAL_TABLET | Freq: Every day | ORAL | 3 refills | Status: DC

## 2022-11-17 NOTE — Progress Notes (Signed)
Subjective:  Patient ID: Philip [name], male    DOB: 1956-01-15  Age: 67 y.o. MRN: [id number]  CC: Medical Management of Chronic Issues   HPI Philip [name] presents for  follow-up of hypertension. Patient has no history of headache chest pain or shortness of breath or recent cough. Patient also denies symptoms of TIA such as focal numbness or weakness. Patient denies side effects from medication. States missed doses. Not sure if taking the hydralazine, amlodipine or metoprolol. Compliance data shows no refills dispensed in last 180 days..    in for follow-up of elevated cholesterol. Doing well without complaints on current medication. Denies side effects of statin including myalgia and arthralgia and nausea. Currently no chest pain, shortness of breath or other cardiovascular related symptoms noted. Again no refills dispensed in 180 days  Patient in for follow-up of GERD. Currently asymptomatic taking  PPI daily. There is no chest pain or heartburn. No hematemesis and no melena. No dysphagia or choking. Onset is remote. Progression is stable. Complicating factors, none.     History Philip [name] has a past medical history of Chronic cholecystitis with calculus (02/04/2013), CVA (cerebral vascular accident) (09/16/2012), Diabetes mellitus without complication (HCC), Erectile dysfunction, Gallstones (01/27/2013), GERD (gastroesophageal reflux disease), H. pylori infection (09/28/2017), Headache(784.0), Hepatitis C, History of kidney stones, History of migraine, Hypertension, Hypertension, Nausea, Seizures (HCC), Short-term memory loss, Sleep apnea, Stroke (HCC), Tobacco user, and Umbilical hernia (02/04/2013).   He has a past surgical history that includes Umbilical hernia repair (N/A, 03/09/2013); Colonoscopy (06/2014); Esophagogastroduodenoscopy (egd) with propofol (N/A, 08/21/2015); Cholecystectomy (N/A, 03/09/2013); Esophagogastroduodenoscopy (egd) with propofol (N/A, 03/10/2017); and biopsy (03/10/2017).    His family history includes Cancer in his cousin; Colon cancer in his maternal uncle; Heart disease in his maternal grandfather, maternal grandmother, paternal grandfather, and paternal grandmother; Hypertension in his mother.He reports that he has been smoking cigarettes. He started smoking about 44 years ago. He has a 5.00 pack-year smoking history. He has never used smokeless tobacco. He reports current alcohol use of about 7.0 standard drinks of alcohol per week. He reports that he does not use drugs.  Current Outpatient Medications on File Prior to Visit  Medication Sig Dispense Refill   Multiple Vitamin (MULTIVITAMIN WITH MINERALS) TABS tablet Take 1 tablet by mouth daily.     sildenafil (REVATIO) 20 MG tablet TAKE 2 TO 5 TABLETS BY MOUTH ONCE DAILY AS NEEDED FOR ERECTILE DYSFUNCTION 50 tablet 0   topiramate (TOPAMAX) 50 MG tablet Take 1 tablet by mouth twice daily 180 tablet 0   No current facility-administered medications on file prior to visit.    ROS Review of Systems  Constitutional:  Negative for fever.  Respiratory:  Negative for shortness of breath.   Cardiovascular:  Negative for chest pain.  Musculoskeletal:  Negative for arthralgias.  Skin:  Negative for rash.    Objective:  BP (!) 189/112   Pulse 82   Temp 98.1 F (36.7 C)   Ht 5\' 7"  (1.702 m)   Wt 181 lb (82.1 kg)   SpO2 96%   BMI 28.35 kg/m   BP Readings from Last 3 Encounters:  11/17/22 (!) 189/112  10/24/21 125/87  05/15/21 128/82    Wt Readings from Last 3 Encounters:  11/17/22 181 lb (82.1 kg)  12/03/21 186 lb (84.4 kg)  10/24/21 185 lb 12.8 oz (84.3 kg)     Physical Exam Vitals reviewed.  Constitutional:      Appearance: He is well-developed.  HENT:  Head: Normocephalic and atraumatic.     Right Ear: External ear normal.     Left Ear: External ear normal.     Mouth/Throat:     Pharynx: No oropharyngeal exudate or posterior oropharyngeal erythema.  Eyes:     Pupils: Pupils are  equal, round, and reactive to light.  Cardiovascular:     Rate and Rhythm: Normal rate and regular rhythm.     Heart sounds: No murmur heard. Pulmonary:     Effort: No respiratory distress.     Breath sounds: Normal breath sounds.  Musculoskeletal:     Cervical back: Normal range of motion and neck supple.  Neurological:     Mental Status: He is alert and oriented to person, place, and time.       Assessment & Plan:   Philip [name] was seen today for medical management of chronic issues.  Diagnoses and all orders for this visit:  Prediabetes -     Bayer DCA Hb A1c Waived  Primary hypertension -     CBC with Differential/Platelet -     CMP14+EGFR  Hyperlipidemia, unspecified hyperlipidemia type -     Lipid panel -     atorvastatin (LIPITOR) 40 MG tablet; Take 1 tablet (40 mg total) by mouth daily. for cholesterol.  Essential hypertension -     amLODipine (NORVASC) 10 MG tablet; Take 1 tablet (10 mg total) by mouth daily. -     hydrALAZINE (APRESOLINE) 25 MG tablet; TAKE 1 TABLET BY MOUTH THREE TIMES DAILY -     metoprolol succinate (TOPROL-XL) 50 MG 24 hr tablet; TAKE 1 TABLET BY MOUTH ONCE DAILY WITH OR IMMEDIATELY FOLLOWING A MEAL. A  Chronic nonintractable headache, unspecified headache type -     gabapentin (NEURONTIN) 300 MG capsule; Take 2 capsules (600 mg total) by mouth at bedtime.  Other orders -     pantoprazole (PROTONIX) 40 MG tablet; Take 1 tablet (40 mg total) by mouth daily.   Allergies as of 11/17/2022   No Known Allergies      Medication List        Accurate as of November 17, 2022  3:26 PM. If you have any questions, ask your nurse or doctor.          amLODipine 10 MG tablet Commonly known as: NORVASC Take 1 tablet (10 mg total) by mouth daily.   atorvastatin 40 MG tablet Commonly known as: LIPITOR Take 1 tablet (40 mg total) by mouth daily. for cholesterol.   gabapentin 300 MG capsule Commonly known as: NEURONTIN Take 2 capsules (600 mg  total) by mouth at bedtime.   hydrALAZINE 25 MG tablet Commonly known as: APRESOLINE TAKE 1 TABLET BY MOUTH THREE TIMES DAILY What changed: additional instructions Changed by: Mechele Claude, MD   metoprolol succinate 50 MG 24 hr tablet Commonly known as: TOPROL-XL TAKE 1 TABLET BY MOUTH ONCE DAILY WITH OR IMMEDIATELY FOLLOWING A MEAL. A What changed: additional instructions Changed by: Mechele Claude, MD   multivitamin with minerals Tabs tablet Take 1 tablet by mouth daily.   pantoprazole 40 MG tablet Commonly known as: PROTONIX Take 1 tablet (40 mg total) by mouth daily.   sildenafil 20 MG tablet Commonly known as: REVATIO TAKE 2 TO 5 TABLETS BY MOUTH ONCE DAILY AS NEEDED FOR ERECTILE DYSFUNCTION   topiramate 50 MG tablet Commonly known as: TOPAMAX Take 1 tablet by mouth twice daily        Meds ordered this encounter  Medications   amLODipine (  NORVASC) 10 MG tablet    Sig: Take 1 tablet (10 mg total) by mouth daily.    Dispense:  90 tablet    Refill:  0   atorvastatin (LIPITOR) 40 MG tablet    Sig: Take 1 tablet (40 mg total) by mouth daily. for cholesterol.    Dispense:  90 tablet    Refill:  3   gabapentin (NEURONTIN) 300 MG capsule    Sig: Take 2 capsules (600 mg total) by mouth at bedtime.    Dispense:  180 capsule    Refill:  3   hydrALAZINE (APRESOLINE) 25 MG tablet    Sig: TAKE 1 TABLET BY MOUTH THREE TIMES DAILY    Dispense:  90 tablet    Refill:  0   metoprolol succinate (TOPROL-XL) 50 MG 24 hr tablet    Sig: TAKE 1 TABLET BY MOUTH ONCE DAILY WITH OR IMMEDIATELY FOLLOWING A MEAL. A    Dispense:  30 tablet    Refill:  1   pantoprazole (PROTONIX) 40 MG tablet    Sig: Take 1 tablet (40 mg total) by mouth daily.    Dispense:  90 tablet    Refill:  3    [place] heart and kidney risk due to acclerated BP reviewed today.   Follow-up: Return in about 4 weeks (around 12/15/2022) for hypertension.  Mechele Claude, M.D.

## 2022-11-18 LAB — LIPID PANEL
Chol/HDL Ratio: 2.2 ratio (ref 0.0–5.0)
Cholesterol, Total: 145 mg/dL (ref 100–199)
HDL: 66 mg/dL (ref >39)
LDL Chol Calc (NIH): 57 mg/dL (ref 0–99)
Triglycerides: 124 mg/dL (ref 0–149)
VLDL Cholesterol Cal: 22 mg/dL (ref 5–40)

## 2022-11-18 LAB — CBC WITH DIFFERENTIAL/PLATELET
Basophils Absolute: 0.1 10*3/uL (ref 0.0–0.2)
Basos: 1 %
EOS (ABSOLUTE): 0.4 10*3/uL (ref 0.0–0.4)
Eos: 7 %
Hematocrit: 46.6 % (ref 37.5–51.0)
Hemoglobin: 16.6 g/dL (ref 13.0–17.7)
Immature Grans (Abs): 0 10*3/uL (ref 0.0–0.1)
Immature Granulocytes: 0 %
Lymphocytes Absolute: 1.2 10*3/uL (ref 0.7–3.1)
Lymphs: 21 %
MCH: 32.2 pg (ref 26.6–33.0)
MCHC: 35.6 g/dL (ref 31.5–35.7)
MCV: 90 fL (ref 79–97)
Monocytes Absolute: 0.7 10*3/uL (ref 0.1–0.9)
Monocytes: 11 %
Neutrophils Absolute: 3.6 10*3/uL (ref 1.4–7.0)
Neutrophils: 60 %
Platelets: 123 10*3/uL — ABNORMAL LOW (ref 150–450)
RBC: 5.16 x10E6/uL (ref 4.14–5.80)
RDW: 13.8 % (ref 11.6–15.4)
WBC: 5.9 10*3/uL (ref 3.4–10.8)

## 2022-11-18 LAB — CMP14+EGFR
ALT: 20 IU/L (ref 0–44)
AST: 31 IU/L (ref 0–40)
Albumin: 4.1 g/dL (ref 3.9–4.9)
Alkaline Phosphatase: 135 IU/L — ABNORMAL HIGH (ref 44–121)
BUN/Creatinine Ratio: 5 — ABNORMAL LOW (ref 10–24)
BUN: 6 mg/dL — ABNORMAL LOW (ref 8–27)
Bilirubin Total: 0.6 mg/dL (ref 0.0–1.2)
CO2: 21 mmol/L (ref 20–29)
Calcium: 9.5 mg/dL (ref 8.6–10.2)
Chloride: 101 mmol/L (ref 96–106)
Creatinine, Ser: 1.27 mg/dL (ref 0.76–1.27)
Globulin, Total: 3.3 g/dL (ref 1.5–4.5)
Glucose: 84 mg/dL (ref 70–99)
Potassium: 4.9 mmol/L (ref 3.5–5.2)
Sodium: 140 mmol/L (ref 134–144)
Total Protein: 7.4 g/dL (ref 6.0–8.5)
eGFR: 62 mL/min/{1.73_m2} (ref >59)

## 2022-11-18 NOTE — Progress Notes (Signed)
Hello [place],  Your lab result is normal and/or stable.Some minor variations that are not significant are commonly marked abnormal, but do not represent any medical problem for you.  Best regards, Brooklee Michelin, M.D.

## 2022-11-25 ENCOUNTER — Other Ambulatory Visit: Payer: Self-pay | Admitting: Family Medicine

## 2022-11-25 DIAGNOSIS — N529 Male erectile dysfunction, unspecified: Secondary | ICD-10-CM

## 2022-12-04 ENCOUNTER — Ambulatory Visit: Payer: No Typology Code available for payment source

## 2022-12-04 VITALS — Ht 67.0 in | Wt 180.0 lb

## 2022-12-04 DIAGNOSIS — Z0000 Encounter for general adult medical examination without abnormal findings: Secondary | ICD-10-CM

## 2022-12-04 DIAGNOSIS — Z1211 Encounter for screening for malignant neoplasm of colon: Secondary | ICD-10-CM

## 2022-12-04 NOTE — Progress Notes (Signed)
Subjective:   Philip [name] is a 67 y.o. male who presents for Medicare Annual/Subsequent preventive examination.  Visit Complete: Virtual  I connected with  [name] on 12/04/22 by a audio enabled telemedicine application and verified that I am speaking with the correct person using two identifiers.  Patient Location: Home  Provider Location: Home Office  I discussed the limitations of evaluation and management by telemedicine. The patient expressed understanding and agreed to proceed.  Patient Medicare AWV questionnaire was completed by the patient on 12/04/2022; I have confirmed that all information answered by patient is correct and no changes since this date.  Review of Systems     Cardiac Risk Factors include: advanced age (>62men, >29 women);hypertension;male gender;dyslipidemia;smoking/ tobacco exposure     Objective:    Today's Vitals   12/04/22 1536  Weight: 180 lb (81.6 kg)  Height: 5\' 7"  (1.702 m)   Body mass index is 28.19 kg/m.     12/04/2022    3:39 PM 12/03/2021    3:08 PM 03/10/2021    1:19 PM 03/10/2017    6:35 AM 03/06/2017   10:15 AM 03/26/2016    1:20 PM 08/20/2015   11:25 AM  Advanced Directives  Does Patient Have a Medical Advance Directive? Yes Yes No No No No No  Type of Estate agent of [place];Living will Healthcare Power of [place];Living will       Copy of Healthcare Power of Attorney in Chart? No - copy requested No - copy requested       Would patient like information on creating a medical advance directive?   No - Patient declined No - Patient declined No - Patient declined No - patient declined information No - patient declined information    Current Medications (verified) Outpatient Encounter Medications as of 12/04/2022  Medication Sig   amLODipine (NORVASC) 10 MG tablet Take 1 tablet (10 mg total) by mouth daily.   atorvastatin (LIPITOR) 40 MG tablet Take 1 tablet (40 mg total) by mouth daily. for  cholesterol.   gabapentin (NEURONTIN) 300 MG capsule Take 2 capsules (600 mg total) by mouth at bedtime.   hydrALAZINE (APRESOLINE) 25 MG tablet TAKE 1 TABLET BY MOUTH THREE TIMES DAILY   metoprolol succinate (TOPROL-XL) 50 MG 24 hr tablet TAKE 1 TABLET BY MOUTH ONCE DAILY WITH OR IMMEDIATELY FOLLOWING A MEAL. A   Multiple Vitamin (MULTIVITAMIN WITH MINERALS) TABS tablet Take 1 tablet by mouth daily.   pantoprazole (PROTONIX) 40 MG tablet Take 1 tablet (40 mg total) by mouth daily.   sildenafil (REVATIO) 20 MG tablet TAKE 2 TO 5 TABLETS BY MOUTH ONCE DAILY AS NEEDED FOR ERECTILE DYSFUNCTION   topiramate (TOPAMAX) 50 MG tablet Take 1 tablet by mouth twice daily   No facility-administered encounter medications on file as of 12/04/2022.    Allergies (verified) Patient has no known allergies.   History: Past Medical History:  Diagnosis Date   Chronic cholecystitis with calculus 02/04/2013   CVA (cerebral vascular accident) 09/16/2012   Diabetes mellitus without complication (HCC)    borderline   Erectile dysfunction    Gallstones 01/27/2013   GERD (gastroesophageal reflux disease)    H. pylori infection 09/28/2017   Headache(784.0)    "Headache, I dont think they are migranes"   Hepatitis C    Harvoni, completed 06/2015 (Dr. Luciana Axe) , AUG 2017 SVR   History of kidney stones    history of kidney stones   History of migraine  takes Topamax daily   Hypertension    takes Hydralazine,Maxzide,Clonidine,and Amlodipine daily   Hypertension    also takes Metoprolol daily   Nausea    takes Zofran daily   Seizures (HCC)    off Dilantin daily.  last one was when he had a stroke.   Short-term memory loss    due to stroke    Sleep apnea    CPAP- tries to do it everyday.   Stroke (HCC)    .  Short term memory loss.; left sided weakness   Tobacco user    Umbilical hernia 02/04/2013   Past Surgical History:  Procedure Laterality Date   BIOPSY  03/10/2017   Procedure: BIOPSY;  Surgeon:  West Bali, MD;  Location: [facility];  Service: Endoscopy;;  gastric biopsy    CHOLECYSTECTOMY N/A 03/09/2013   Procedure: LAPAROSCOPIC CHOLECYSTECTOMY WITH INTRAOPERATIVE CHOLANGIOGRAM;  Surgeon: [name], MD;  Location: [facility] OR;  Service: General;  Laterality: N/A;   COLONOSCOPY  06/2014   Dr. Juanda Chance: Three sessile polyps were found in the rectum x2 and descending. tubular adenomas. colonoscopy in 06/2019   ESOPHAGOGASTRODUODENOSCOPY (EGD) WITH PROPOFOL N/A 08/21/2015   Dr. Darrick Penna: normal esophagus, non-bleeding gastric ulcers/gastritis (H.pylori)    ESOPHAGOGASTRODUODENOSCOPY (EGD) WITH PROPOFOL N/A 03/10/2017   Procedure: ESOPHAGOGASTRODUODENOSCOPY (EGD) WITH PROPOFOL;  Surgeon: West Bali, MD;  Location: [facility];  Service: Endoscopy;  Laterality: N/A;  1:00pm-rescheduled to 10\16 @ 8:15am    UMBILICAL HERNIA REPAIR N/A 03/09/2013   Procedure: HERNIA REPAIR UMBILICAL ADULT;  Surgeon: [name], MD;  Location: [facility] OR;  Service: General;  Laterality: N/A;   Family History  Problem Relation Age of Onset   Heart disease Maternal Grandmother    Heart disease Maternal Grandfather    Heart disease Paternal Grandmother    Heart disease Paternal Grandfather    Cancer Cousin    Hypertension Mother    Colon cancer Maternal Uncle    Rectal cancer Neg Hx    Stomach cancer Neg Hx    Social History   Socioeconomic History   Marital status: Single    Spouse name: Not on file   Number of children: 1   Years of education: Not on file   Highest education level: Not on file  Occupational History   Occupation: retired  Tobacco Use   Smoking status: Every Day    Current packs/day: 0.20    Average packs/day: 0.2 packs/day for 44.5 years (8.9 ttl pk-yrs)    Types: Cigarettes    Start date: 05/26/1978   Smokeless tobacco: Never   Tobacco comments:    slowing down  Vaping Use   Vaping status: Never Used  Substance and Sexual Activity   Alcohol use: Yes     Alcohol/week: 7.0 standard drinks of alcohol    Types: 6 Cans of beer, 1 Standard drinks or equivalent per week    Comment: drinks daily - usually two 40oz beers per day   Drug use: No    Frequency: 1.0 times per week   Sexual activity: Not Currently    Birth control/protection: None  Other Topics Concern   Not on file  Social History Narrative   Daughter lives in [place]   Social Determinants of Health   Financial Resource Strain: Low Risk  (12/04/2022)   Overall Financial Resource Strain (CARDIA)    Difficulty of Paying Living Expenses: Not hard at all  Food Insecurity: No Food Insecurity (12/04/2022)   Hunger Vital Sign  Worried About Programme researcher, broadcasting/film/video in the Last Year: Never true    Ran Out of Food in the Last Year: Never true  Transportation Needs: No Transportation Needs (12/03/2021)   PRAPARE - Administrator, Civil Service (Medical): No    Lack of Transportation (Non-Medical): No  Physical Activity: Insufficiently Active (12/04/2022)   Exercise Vital Sign    Days of Exercise per Week: 2 days    Minutes of Exercise per Session: 30 min  Stress: No Stress Concern Present (12/04/2022)   [employer] of Occupational Health - Occupational Stress Questionnaire    Feeling of Stress : Not at all  Social Connections: Socially Isolated (12/04/2022)   Social Connection and Isolation Panel [NHANES]    Frequency of Communication with Friends and Family: More than three times a week    Frequency of Social Gatherings with Friends and Family: More than three times a week    Attends Religious Services: Never    Database administrator or Organizations: No    Attends Engineer, structural: Never    Marital Status: Divorced    Tobacco Counseling Ready to quit: No Counseling given: Not Answered Tobacco comments: slowing down   Clinical Intake:  Pre-visit preparation completed: Yes  Pain : No/denies pain     Nutritional Risks: None Diabetes: No  How often  do you need to have someone help you when you read instructions, pamphlets, or other written materials from your doctor or pharmacy?: 1 - Never  Interpreter Needed?: No  Information entered by :: Renie Ora, LPN   Activities of Daily Living    12/04/2022    3:40 PM  In your present state of health, do you have any difficulty performing the following activities:  Hearing? 0  Vision? 0  Difficulty concentrating or making decisions? 0  Walking or climbing stairs? 0  Dressing or bathing? 0  Doing errands, shopping? 0  Preparing Food and eating ? N  Using the Toilet? N  In the past six months, have you accidently leaked urine? N  Do you have problems with loss of bowel control? N  Managing your Medications? N  Managing your Finances? N  Housekeeping or managing your Housekeeping? N    Patient Care Team: Mechele Claude, MD as PCP - General (Family Medicine)  Indicate any recent Medical Services you may have received from other than [facility] providers in the past year (date may be approximate).     Assessment:   This is a routine wellness examination for Philip [name].  Hearing/Vision screen Vision Screening - Comments:: Wears rx glasses - up to date with routine eye exams with  Dr.Martin   Dietary issues and exercise activities discussed:     Goals Addressed             This Visit's Progress    DIET - INCREASE WATER INTAKE         Depression Screen    12/04/2022    3:39 PM 11/17/2022    2:28 PM 12/03/2021    2:55 PM 10/24/2021   10:00 AM 05/15/2021    1:00 PM 05/15/2021   12:50 PM 04/29/2021    2:49 PM  PHQ 2/9 Scores  PHQ - 2 Score 0 0 0 0 0 0 0  PHQ- 9 Score     6      Fall Risk    12/04/2022    3:37 PM 11/17/2022    2:28 PM 12/03/2021    2:53  PM 10/24/2021   10:00 AM 05/15/2021   12:50 PM  Fall Risk   Falls in the past year? 0 0 0 0 1  Number falls in past yr: 0  0  1  Injury with Fall? 0  0  0  Risk for fall due to : No Fall Risks  Orthopedic patient;Other  (Comment)  History of fall(s)  Risk for fall due to: Comment   daily alcohol use    Follow up Falls prevention discussed  Falls prevention discussed  Falls evaluation completed    MEDICARE RISK AT HOME:  Medicare Risk at Home - 12/04/22 1537     Any stairs in or around the home? No    If so, are there any without handrails? No    Home free of loose throw rugs in walkways, pet beds, electrical cords, etc? Yes    Adequate lighting in your home to reduce risk of falls? Yes    Life alert? No    Use of a cane, walker or w/c? No    Grab bars in the bathroom? No    Shower chair or bench in shower? No    Elevated toilet seat or a handicapped toilet? No             TIMED UP AND GO:  Was the test performed?  No    Cognitive Function:        12/04/2022    3:40 PM 12/03/2021    2:56 PM  6CIT Screen  What Year? 0 points 0 points  What month? 0 points 0 points  What time? 0 points 0 points  Count back from 20 0 points 0 points  Months in reverse 0 points 4 points  Repeat phrase 0 points 6 points  Total Score 0 points 10 points    Immunizations Immunization History  Administered Date(s) Administered   Hepatitis B, ADULT 06/28/2015, 08/03/2015, 12/25/2015   Moderna SARS-COV2 Booster Vaccination 05/02/2020   Moderna Sars-Covid-2 Vaccination 09/08/2019, 10/11/2019   Pneumococcal Polysaccharide-23 06/28/2015    TDAP status: Due, Education has been provided regarding the importance of this vaccine. Advised may receive this vaccine at local pharmacy or Health Dept. Aware to provide a copy of the vaccination record if obtained from local pharmacy or Health Dept. Verbalized acceptance and understanding.  Flu Vaccine status: Declined, Education has been provided regarding the importance of this vaccine but patient still declined. Advised may receive this vaccine at local pharmacy or Health Dept. Aware to provide a copy of the vaccination record if obtained from local pharmacy or Health  Dept. Verbalized acceptance and understanding.  Pneumococcal vaccine status: Up to date  Covid-19 vaccine status: Completed vaccines  Qualifies for Shingles Vaccine? Yes   Zostavax completed No   Shingrix Completed?: No.    Education has been provided regarding the importance of this vaccine. Patient has been advised to call insurance company to determine out of pocket expense if they have not yet received this vaccine. Advised may also receive vaccine at local pharmacy or Health Dept. Verbalized acceptance and understanding.  Screening Tests Health Maintenance  Topic Date Due   DTaP/Tdap/Td (1 - Tdap) Never done   Colonoscopy  07/22/2019   COVID-19 Vaccine (4 - 2023-24 season) 01/24/2022   Zoster Vaccines- Shingrix (1 of 2) 02/17/2023 (Originally 10/21/2005)   Pneumonia Vaccine 71+ Years old (2 of 2 - PCV) 11/17/2023 (Originally 06/27/2016)   INFLUENZA VACCINE  12/25/2022   Medicare Annual Wellness (AWV)  12/04/2023  Hepatitis C Screening  Completed   HPV VACCINES  Aged Out    Health Maintenance  Health Maintenance Due  Topic Date Due   DTaP/Tdap/Td (1 - Tdap) Never done   Colonoscopy  07/22/2019   COVID-19 Vaccine (4 - 2023-24 season) 01/24/2022    Colorectal cancer screening: Referral to GI placed 12/04/2022. Pt aware the office will call re: appt.  Lung Cancer Screening: (Low Dose CT Chest recommended if Age 9-80 years, 20 pack-year currently smoking OR have quit w/in 15years.) does qualify.   Lung Cancer Screening Referral: declined   Additional Screening:  Hepatitis C Screening: does not qualify  Vision Screening: Recommended annual ophthalmology exams for early detection of glaucoma and other disorders of the eye. Is the patient up to date with their annual eye exam?  Yes  Who is the provider or what is the name of the office in which the patient attends annual eye exams? Dr.Martin  If pt is not established with a provider, would they like to be referred to a  provider to establish care? No .   Dental Screening: Recommended annual dental exams for proper oral hygiene    Community Resource Referral / Chronic Care Management: CRR required this visit?  No   CCM required this visit?  No     Plan:     I have personally reviewed and noted the following in the patient's chart:   Medical and social history Use of alcohol, tobacco or illicit drugs  Current medications and supplements including opioid prescriptions. Patient is not currently taking opioid prescriptions. Functional ability and status Nutritional status Physical activity Advanced directives List of other physicians Hospitalizations, surgeries, and ER visits in previous 12 months Vitals Screenings to include cognitive, depression, and falls Referrals and appointments  In addition, I have reviewed and discussed with patient certain preventive protocols, quality metrics, and best practice recommendations. A written personalized care plan for preventive services as well as general preventive health recommendations were provided to patient.     Lorrene Reid, LPN   [id number]/[id number]   After Visit Summary: (MyChart) Due to this being a telephonic visit, the after visit summary with patients personalized plan was offered to patient via MyChart   Nurse Notes: Due TDAP Vaccine

## 2022-12-04 NOTE — Patient Instructions (Signed)
Philip [name] , Thank you for taking time to come for your Medicare Wellness Visit. I appreciate your ongoing commitment to your health goals. Please review the following plan we discussed and let me know if I can assist you in the future.   These are the goals we discussed:  Goals      DIET - INCREASE WATER INTAKE     Quit Smoking     If you wish to quit smoking, help is available. For free tobacco cessation program offerings call the [facility] at (364) 190-1185 or Live Well Line at 847-014-2363. You may also visit [email or web address] or email [email or web address] [email or web address] for more information on other programs.   You may also call 1-800-QUIT-NOW (301-087-2130) or visit www.[place].ch or www.[employer].org for additional resources on smoking cessation.       Reduce alcohol intake        This is a list of the screening recommended for you and due dates:  Health Maintenance  Topic Date Due   DTaP/Tdap/Td vaccine (1 - Tdap) Never done   Colon Cancer Screening  07/22/2019   COVID-19 Vaccine (4 - 2023-24 season) 01/24/2022   Zoster (Shingles) Vaccine (1 of 2) 02/17/2023*   Pneumonia Vaccine (2 of 2 - PCV) 11/17/2023*   Flu Shot  12/25/2022   Medicare Annual Wellness Visit  12/04/2023   Hepatitis C Screening  Completed   HPV Vaccine  Aged Out  *Topic was postponed. The date shown is not the original due date.    Advanced directives: Advance directive discussed with you today. I have provided a copy for you to complete at home and have notarized. Once this is complete please bring a copy in to our office so we can scan it into your chart. Information on Advanced Care Planning can be found at [facility] [place] Advance Health Care Directives Advance Health Care Directives ([email or web address]    Conditions/risks identified: Aim for 30 minutes of exercise or brisk walking, 6-8 glasses of water, and 5 servings of fruits and vegetables each day.   Next appointment:  Follow up in one year for your annual wellness visit.   Preventive Care 66 Years and Older, Male  Preventive care refers to lifestyle choices and visits with your health care provider that can promote health and wellness. What does preventive care include? A yearly physical exam. This is also called an annual well check. Dental exams once or twice a year. Routine eye exams. Ask your health care provider how often you should have your eyes checked. Personal lifestyle choices, including: Daily care of your teeth and gums. Regular physical activity. Eating a healthy diet. Avoiding tobacco and drug use. Limiting alcohol use. Practicing safe sex. Taking low doses of aspirin every day. Taking vitamin and mineral supplements as recommended by your health care provider. What happens during an annual well check? The services and screenings done by your health care provider during your annual well check will depend on your age, overall health, lifestyle risk factors, and family history of disease. Counseling  Your health care provider may ask you questions about your: Alcohol use. Tobacco use. Drug use. Emotional well-being. Home and relationship well-being. Sexual activity. Eating habits. History of falls. Memory and ability to understand (cognition). Work and work Astronomer. Screening  You may have the following tests or measurements: Height, weight, and BMI. Blood pressure. Lipid and cholesterol levels. These may be checked every 5 years, or more frequently if you are over 26 years old.  Skin check. Lung cancer screening. You may have this screening every year starting at age 23 if you have a 30-pack-year history of smoking and currently smoke or have quit within the past 15 years. Fecal occult blood test (FOBT) of the stool. You may have this test every year starting at age 33. Flexible sigmoidoscopy or colonoscopy. You may have a sigmoidoscopy every 5 years or a colonoscopy every  10 years starting at age 26. Prostate cancer screening. Recommendations will vary depending on your family history and other risks. Hepatitis C blood test. Hepatitis B blood test. Sexually transmitted disease (STD) testing. Diabetes screening. This is done by checking your blood sugar (glucose) after you have not eaten for a while (fasting). You may have this done every 1-3 years. Abdominal aortic aneurysm (AAA) screening. You may need this if you are a current or former smoker. Osteoporosis. You may be screened starting at age 17 if you are at high risk. Talk with your health care provider about your test results, treatment options, and if necessary, the need for more tests. Vaccines  Your health care provider may recommend certain vaccines, such as: Influenza vaccine. This is recommended every year. Tetanus, diphtheria, and acellular pertussis (Tdap, Td) vaccine. You may need a Td booster every 10 years. Zoster vaccine. You may need this after age 42. Pneumococcal 13-valent conjugate (PCV13) vaccine. One dose is recommended after age 48. Pneumococcal polysaccharide (PPSV23) vaccine. One dose is recommended after age 34. Talk to your health care provider about which screenings and vaccines you need and how often you need them. This information is not intended to replace advice given to you by your health care provider. Make sure you discuss any questions you have with your health care provider. Document Released: 06/08/2015 Document Revised: 01/30/2016 Document Reviewed: 03/13/2015 Elsevier Interactive Patient Education  2017 [employer].  Fall Prevention in the Home Falls can cause injuries. They can happen to people of all ages. There are many things you can do to make your home safe and to help prevent falls. What can I do on the outside of my home? Regularly fix the edges of walkways and driveways and fix any cracks. Remove anything that might make you trip as you walk through a door,  such as a raised step or threshold. Trim any bushes or trees on the path to your home. Use bright outdoor lighting. Clear any walking paths of anything that might make someone trip, such as rocks or tools. Regularly check to see if handrails are loose or broken. Make sure that both sides of any steps have handrails. Any raised decks and porches should have guardrails on the edges. Have any leaves, snow, or ice cleared regularly. Use sand or salt on walking paths during winter. Clean up any spills in your garage right away. This includes oil or grease spills. What can I do in the bathroom? Use night lights. Install grab bars by the toilet and in the tub and shower. Do not use towel bars as grab bars. Use non-skid mats or decals in the tub or shower. If you need to sit down in the shower, use a plastic, non-slip stool. Keep the floor dry. Clean up any water that spills on the floor as soon as it happens. Remove soap buildup in the tub or shower regularly. Attach bath mats securely with double-sided non-slip rug tape. Do not have throw rugs and other things on the floor that can make you trip. What can I do in  the bedroom? Use night lights. Make sure that you have a light by your bed that is easy to reach. Do not use any sheets or blankets that are too big for your bed. They should not hang down onto the floor. Have a firm chair that has side arms. You can use this for support while you get dressed. Do not have throw rugs and other things on the floor that can make you trip. What can I do in the kitchen? Clean up any spills right away. Avoid walking on wet floors. Keep items that you use a lot in easy-to-reach places. If you need to reach something above you, use a strong step stool that has a grab bar. Keep electrical cords out of the way. Do not use floor polish or wax that makes floors slippery. If you must use wax, use non-skid floor wax. Do not have throw rugs and other things on the  floor that can make you trip. What can I do with my stairs? Do not leave any items on the stairs. Make sure that there are handrails on both sides of the stairs and use them. Fix handrails that are broken or loose. Make sure that handrails are as long as the stairways. Check any carpeting to make sure that it is firmly attached to the stairs. Fix any carpet that is loose or worn. Avoid having throw rugs at the top or bottom of the stairs. If you do have throw rugs, attach them to the floor with carpet tape. Make sure that you have a light switch at the top of the stairs and the bottom of the stairs. If you do not have them, ask someone to add them for you. What else can I do to help prevent falls? Wear shoes that: Do not have high heels. Have rubber bottoms. Are comfortable and fit you well. Are closed at the toe. Do not wear sandals. If you use a stepladder: Make sure that it is fully opened. Do not climb a closed stepladder. Make sure that both sides of the stepladder are locked into place. Ask someone to hold it for you, if possible. Clearly mark and make sure that you can see: Any grab bars or handrails. First and last steps. Where the edge of each step is. Use tools that help you move around (mobility aids) if they are needed. These include: Canes. Walkers. Scooters. Crutches. Turn on the lights when you go into a dark area. Replace any light bulbs as soon as they burn out. Set up your furniture so you have a clear path. Avoid moving your furniture around. If any of your floors are uneven, fix them. If there are any pets around you, be aware of where they are. Review your medicines with your doctor. Some medicines can make you feel dizzy. This can increase your chance of falling. Ask your doctor what other things that you can do to help prevent falls. This information is not intended to replace advice given to you by your health care provider. Make sure you discuss any questions  you have with your health care provider. Document Released: 03/08/2009 Document Revised: 10/18/2015 Document Reviewed: 06/16/2014 Elsevier Interactive Patient Education  2017 [employer].

## 2022-12-08 ENCOUNTER — Encounter: Payer: Self-pay | Admitting: *Deleted

## 2022-12-17 ENCOUNTER — Ambulatory Visit: Payer: No Typology Code available for payment source | Admitting: Family Medicine

## 2022-12-18 ENCOUNTER — Encounter: Payer: Self-pay | Admitting: Family Medicine

## 2022-12-23 ENCOUNTER — Ambulatory Visit: Payer: No Typology Code available for payment source | Admitting: Family Medicine

## 2022-12-25 ENCOUNTER — Encounter: Payer: Self-pay | Admitting: Family Medicine

## 2022-12-29 ENCOUNTER — Ambulatory Visit (INDEPENDENT_AMBULATORY_CARE_PROVIDER_SITE_OTHER): Payer: No Typology Code available for payment source | Admitting: Family Medicine

## 2022-12-29 ENCOUNTER — Encounter: Payer: Self-pay | Admitting: Family Medicine

## 2022-12-29 VITALS — BP 196/112 | HR 80 | Temp 97.7°F | Ht 67.0 in | Wt 178.4 lb

## 2022-12-29 DIAGNOSIS — I10 Essential (primary) hypertension: Secondary | ICD-10-CM

## 2022-12-29 MED ORDER — HYDRALAZINE HCL 50 MG PO TABS: 50.0000 mg | ORAL_TABLET | Freq: Three times a day (TID) | ORAL | 0 refills | Status: DC

## 2022-12-29 NOTE — Progress Notes (Signed)
Subjective:  Patient ID: Philip [name], male    DOB: [birth year]  Age: 67 y.o. MRN: [id number]  CC: Follow-up   HPI Philip [name] presents for  follow-up of hypertension. Patient has no history of headache chest pain or shortness of breath or recent cough. Patient also denies symptoms of TIA such as focal numbness or weakness. Patient denies side effects from medication. States taking it regularly but missed Philip [name] dose this morning. Has missed the afternoon hydralazine dose at times too. Marland Kitchen   History Philip [name] has a past medical history of Chronic cholecystitis with calculus (02/04/2013), CVA (cerebral vascular accident) (09/16/2012), Diabetes mellitus without complication (HCC), Erectile dysfunction, Gallstones (01/27/2013), GERD (gastroesophageal reflux disease), H. pylori infection (09/28/2017), Headache(784.0), Hepatitis C, History of kidney stones, History of migraine, Hypertension, Hypertension, Nausea, Seizures (HCC), Short-term memory loss, Sleep apnea, Stroke (HCC), Tobacco user, and Umbilical hernia (02/04/2013).   Philip [name] has a past surgical history that includes Umbilical hernia repair (N/A, 03/09/2013); Colonoscopy (06/2014); Esophagogastroduodenoscopy (egd) with propofol (N/A, 08/21/2015); Cholecystectomy (N/A, 03/09/2013); Esophagogastroduodenoscopy (egd) with propofol (N/A, 03/10/2017); and biopsy (03/10/2017).   Philip [name] family history includes Cancer in Philip [name] cousin; Colon cancer in Philip [name] maternal uncle; Heart disease in Philip [name] maternal grandfather, maternal grandmother, paternal grandfather, and paternal grandmother; Hypertension in Philip [name] mother.Philip [name] reports that Philip [name] has been smoking cigarettes. Philip [name] started smoking about 44 years ago. Philip [name] has a 8.9 pack-year smoking history. Philip [name] has never used smokeless tobacco. Philip [name] reports current alcohol use of about 7.0 standard drinks of alcohol per week. Philip [name] reports that Philip [name] does not use drugs.  Current Outpatient Medications on File Prior to Visit  Medication Sig Dispense Refill    atorvastatin (LIPITOR) 40 MG tablet Take 1 tablet (40 mg total) by mouth daily. for cholesterol. 90 tablet 3   gabapentin (NEURONTIN) 300 MG capsule Take 2 capsules (600 mg total) by mouth at bedtime. 180 capsule 3   metoprolol succinate (TOPROL-XL) 50 MG 24 hr tablet TAKE 1 TABLET BY MOUTH ONCE DAILY WITH OR IMMEDIATELY FOLLOWING A MEAL. A 30 tablet 1   Multiple Vitamin (MULTIVITAMIN WITH MINERALS) TABS tablet Take 1 tablet by mouth daily.     pantoprazole (PROTONIX) 40 MG tablet Take 1 tablet (40 mg total) by mouth daily. 90 tablet 3   sildenafil (REVATIO) 20 MG tablet TAKE 2 TO 5 TABLETS BY MOUTH ONCE DAILY AS NEEDED FOR ERECTILE DYSFUNCTION 50 tablet 2   topiramate (TOPAMAX) 50 MG tablet Take 1 tablet by mouth twice daily 180 tablet 0   amLODipine (NORVASC) 10 MG tablet Take 1 tablet (10 mg total) by mouth daily. 90 tablet 0   No current facility-administered medications on file prior to visit.    ROS Review of Systems  Constitutional:  Negative for fever.  Respiratory:  Negative for shortness of breath.   Cardiovascular:  Negative for chest pain.  Musculoskeletal:  Negative for arthralgias.  Skin:  Negative for rash.    Objective:  BP (!) 196/112   Pulse 80   Temp 97.7 F (36.5 C)   Ht 5\' 7"  (1.702 m)   Wt 178 lb 6.4 oz (80.9 kg)   SpO2 98%   BMI 27.94 kg/m   BP Readings from Last 3 Encounters:  12/29/22 (!) 196/112  11/17/22 (!) 189/112  10/24/21 125/87    Wt Readings from Last 3 Encounters:  12/29/22 178 lb 6.4 oz (80.9 kg)  12/04/22 180 lb (81.6 kg)  11/17/22 181 lb (82.1 kg)     Physical Exam Vitals  reviewed.  Constitutional:      Appearance: Philip [name] is well-developed.  HENT:     Head: Normocephalic and atraumatic.     Right Ear: External ear normal.     Left Ear: External ear normal.     Mouth/Throat:     Pharynx: No oropharyngeal exudate or posterior oropharyngeal erythema.  Eyes:     Pupils: Pupils are equal, round, and reactive to light.   Cardiovascular:     Rate and Rhythm: Normal rate and regular rhythm.     Heart sounds: No murmur heard. Pulmonary:     Effort: No respiratory distress.     Breath sounds: Normal breath sounds.  Musculoskeletal:     Cervical back: Normal range of motion and neck supple.  Neurological:     Mental Status: Philip [name] is alert and oriented to person, place, and time.       Assessment & Plan:   Philip [name] was seen today for follow-up.  Diagnoses and all orders for this visit:  Accelerated hypertension  Essential hypertension -     hydrALAZINE (APRESOLINE) 50 MG tablet; Take 1 tablet (50 mg total) by mouth 3 (three) times daily.   Allergies as of 12/29/2022   No Known Allergies      Medication List        Accurate as of December 29, 2022  8:42 PM. If you have any questions, ask your nurse or doctor.          amLODipine 10 MG tablet Commonly known as: NORVASC Take 1 tablet (10 mg total) by mouth daily.   atorvastatin 40 MG tablet Commonly known as: LIPITOR Take 1 tablet (40 mg total) by mouth daily. for cholesterol.   gabapentin 300 MG capsule Commonly known as: NEURONTIN Take 2 capsules (600 mg total) by mouth at bedtime.   hydrALAZINE 50 MG tablet Commonly known as: APRESOLINE Take 1 tablet (50 mg total) by mouth 3 (three) times daily. What changed:  medication strength how much to take how to take this when to take this additional instructions Changed by: Shemeika Starzyk   metoprolol succinate 50 MG 24 hr tablet Commonly known as: TOPROL-XL TAKE 1 TABLET BY MOUTH ONCE DAILY WITH OR IMMEDIATELY FOLLOWING A MEAL. A   multivitamin with minerals Tabs tablet Take 1 tablet by mouth daily.   pantoprazole 40 MG tablet Commonly known as: PROTONIX Take 1 tablet (40 mg total) by mouth daily.   sildenafil 20 MG tablet Commonly known as: REVATIO TAKE 2 TO 5 TABLETS BY MOUTH ONCE DAILY AS NEEDED FOR ERECTILE DYSFUNCTION   topiramate 50 MG tablet Commonly known as:  TOPAMAX Take 1 tablet by mouth twice daily        Meds ordered this encounter  Medications   hydrALAZINE (APRESOLINE) 50 MG tablet    Sig: Take 1 tablet (50 mg total) by mouth 3 (three) times daily.    Dispense:  270 tablet    Refill:  0    Can use  hydralazine 2X 25 mg tabs TID until current supply is exhausted  Follow-up: Return in about 1 month (around 01/29/2023).  Mechele Claude, M.D.

## 2023-06-10 ENCOUNTER — Encounter (INDEPENDENT_AMBULATORY_CARE_PROVIDER_SITE_OTHER): Payer: Self-pay | Admitting: *Deleted

## 2023-09-22 ENCOUNTER — Ambulatory Visit: Admitting: Family Medicine

## 2023-09-23 ENCOUNTER — Ambulatory Visit: Admitting: Family Medicine

## 2023-09-25 ENCOUNTER — Encounter: Payer: Self-pay | Admitting: Family Medicine

## 2023-09-25 ENCOUNTER — Ambulatory Visit (INDEPENDENT_AMBULATORY_CARE_PROVIDER_SITE_OTHER): Admitting: Family Medicine

## 2023-09-25 VITALS — BP 206/105 | HR 85 | Temp 98.8°F | Ht 67.0 in | Wt 169.6 lb

## 2023-09-25 DIAGNOSIS — L853 Xerosis cutis: Secondary | ICD-10-CM | POA: Diagnosis not present

## 2023-09-25 DIAGNOSIS — I10 Essential (primary) hypertension: Secondary | ICD-10-CM

## 2023-09-25 MED ORDER — AMLODIPINE BESYLATE 10 MG PO TABS
10.0000 mg | ORAL_TABLET | Freq: Every day | ORAL | 0 refills | Status: DC
Start: 2023-09-25 — End: 2023-12-16

## 2023-09-25 NOTE — Progress Notes (Signed)
   Acute Office Visit  Subjective:     Patient ID: Philip [name], male    DOB: [birth year], 68 y.o.   MRN: [id number]  Chief Complaint  Patient presents with   Eczema    HPI Here with sister today for dry skin on ears. Denies pain, tenderness, changes in hearing, drainage, warmth. Has tried cocoa butter with improvement.   BP is very elevated today. Has been quite elevated for for the last few visits. He is adamant that he is compliant with his medications though no refills have been sent in almost a year. Sister used to put his medication in a pill organizer for him but no longer does because Philip [name] said he could manage his medications. He denies changes in vision, HA, dizziness, weakness, chest pain, shortness of breath, edema.       ROS As per HPI.      Objective:    BP (!) 206/126   Pulse 85   Temp 98.8 F (37.1 C)   Ht 5\' 7"  (1.702 m)   Wt 169 lb 9.6 oz (76.9 kg)   SpO2 96%   BMI 26.56 kg/m  BP Readings from Last 3 Encounters:  09/25/23 (!) 206/126  12/29/22 (!) 196/112  11/17/22 (!) 189/112      Physical Exam Vitals and nursing note reviewed.  Constitutional:      General: He is not in acute distress.    Appearance: He is not ill-appearing, toxic-appearing or diaphoretic.  HENT:     Head:     Comments: Dry skin to bilateral external ears.     Right Ear: Tympanic membrane and ear canal normal.     Left Ear: Tympanic membrane and ear canal normal.  Cardiovascular:     Rate and Rhythm: Normal rate and regular rhythm.     Heart sounds: Normal heart sounds. No murmur heard. Pulmonary:     Effort: Pulmonary effort is normal. No respiratory distress.     Breath sounds: Normal breath sounds. No wheezing, rhonchi or rales.  Musculoskeletal:     Right lower leg: No edema.     Left lower leg: No edema.  Skin:    General: Skin is warm and dry.  Neurological:     General: No focal deficit present.     Mental Status: He is alert and oriented to person, place, and  time.  Psychiatric:        Mood and Affect: Mood normal.        Behavior: Behavior normal.     No results found for any visits on 09/25/23.      Assessment & Plan:   Philip [name] was seen today for eczema.  Diagnoses and all orders for this visit:  Primary hypertension BP very elevated today, consistent with last few office visit readings. Denies symptoms. Suspect that he has not been taking medication as no refills have been sent in. Amlodipine  ordered as below. Nursing staff will contact pharmacy to verify that patient has been picking up medication. Sister will start assisting with pill packs again. Discussed when to seek emergency care.  -     amLODipine  (NORVASC ) 10 MG tablet; Take 1 tablet (10 mg total) by mouth daily.  Dry skin Apply lotion consistently.    Return in about 4 weeks (around 10/23/2023) for chronic follow up with PCP.  Albertha Huger, FNP

## 2023-12-07 ENCOUNTER — Ambulatory Visit: Payer: No Typology Code available for payment source

## 2023-12-07 VITALS — BP 206/105 | HR 85 | Ht 67.0 in | Wt 169.0 lb

## 2023-12-07 DIAGNOSIS — Z0000 Encounter for general adult medical examination without abnormal findings: Secondary | ICD-10-CM | POA: Diagnosis not present

## 2023-12-07 NOTE — Progress Notes (Signed)
 Subjective:   Philip [name] is a 68 y.o. who presents for a Medicare Wellness preventive visit.  As a reminder, Annual Wellness Visits don't include a physical exam, and some assessments may be limited, especially if this visit is performed virtually. We may recommend an in-person follow-up visit with your provider if needed.  Visit Complete: Virtual I connected with  Philip [name] on 12/07/23 by a audio enabled telemedicine application and verified that I am speaking with the correct person using two identifiers.  Patient Location: Home  Provider Location: Home Office  I discussed the limitations of evaluation and management by telemedicine. The patient expressed understanding and agreed to proceed.  Vital Signs: Because this visit was a virtual/telehealth visit, some criteria may be missing or patient reported. Any vitals not documented were not able to be obtained and vitals that have been documented are patient reported.  VideoDeclined- This patient declined Librarian, academic. Therefore the visit was completed with audio only.  Persons Participating in Visit: Patient.  AWV Questionnaire: No: Patient Medicare AWV questionnaire was not completed prior to this visit.  Cardiac Risk Factors include: advanced age (>60men, >20 women);dyslipidemia;hypertension;male gender     Objective:    Today's Vitals   12/07/23 1352  BP: (!) 206/105  Pulse: 85  Weight: 169 lb (76.7 kg)  Height: 5' 7 (1.702 m)   Body mass index is 26.47 kg/m.     12/07/2023    1:58 PM 12/04/2022    3:39 PM 12/03/2021    3:08 PM 03/10/2021    1:19 PM 03/10/2017    6:35 AM 03/06/2017   10:15 AM 03/26/2016    1:20 PM  Advanced Directives  Does Patient Have a Medical Advance Directive? No Yes Yes No No  No  No   Type of Special educational needs teacher of [place];Living will Healthcare Power of [place];Living will      Copy of Healthcare Power of Attorney in Chart?  No -  copy requested No - copy requested      Would patient like information on creating a medical advance directive?    No - Patient declined No - Patient declined  No - Patient declined  No - patient declined information      Data saved with a previous flowsheet row definition    Current Medications (verified) Outpatient Encounter Medications as of 12/07/2023  Medication Sig   amLODipine  (NORVASC ) 10 MG tablet Take 1 tablet (10 mg total) by mouth daily.   atorvastatin  (LIPITOR) 40 MG tablet Take 1 tablet (40 mg total) by mouth daily. for cholesterol.   gabapentin  (NEURONTIN ) 300 MG capsule Take 2 capsules (600 mg total) by mouth at bedtime.   hydrALAZINE  (APRESOLINE ) 50 MG tablet Take 1 tablet (50 mg total) by mouth 3 (three) times daily.   metoprolol  succinate (TOPROL -XL) 50 MG 24 hr tablet TAKE 1 TABLET BY MOUTH ONCE DAILY WITH OR IMMEDIATELY FOLLOWING A MEAL. A   Multiple Vitamin (MULTIVITAMIN WITH MINERALS) TABS tablet Take 1 tablet by mouth daily.   pantoprazole  (PROTONIX ) 40 MG tablet Take 1 tablet (40 mg total) by mouth daily.   sildenafil  (REVATIO ) 20 MG tablet TAKE 2 TO 5 TABLETS BY MOUTH ONCE DAILY AS NEEDED FOR ERECTILE DYSFUNCTION   topiramate  (TOPAMAX ) 50 MG tablet Take 1 tablet by mouth twice daily   No facility-administered encounter medications on file as of 12/07/2023.    Allergies (verified) Patient has no known allergies.   History: Past Medical  History:  Diagnosis Date   Chronic cholecystitis with calculus 02/04/2013   CVA (cerebral vascular accident) 09/16/2012   Diabetes mellitus without complication (HCC)    borderline   Erectile dysfunction    Gallstones 01/27/2013   GERD (gastroesophageal reflux disease)    H. pylori infection 09/28/2017   Headache(784.0)    Headache, I dont think they are migranes   Hepatitis C    Harvoni , completed 06/2015 (Dr. Efrain) , AUG 2017 SVR   History of kidney stones    history of kidney stones   History of migraine    takes Topamax   daily   Hypertension    takes Hydralazine ,Maxzide ,Clonidine ,and Amlodipine  daily   Hypertension    also takes Metoprolol  daily   Nausea    takes Zofran  daily   Seizures (HCC)    off Dilantin  daily.  last one was when he had a stroke.   Short-term memory loss    due to stroke    Sleep apnea    CPAP- tries to do it everyday.   Stroke (HCC)    .  Short term memory loss.; left sided weakness   Tobacco user    Umbilical hernia 02/04/2013   Past Surgical History:  Procedure Laterality Date   BIOPSY  03/10/2017   Procedure: BIOPSY;  Surgeon: Harvey Margo CROME, MD;  Location: [facility];  Service: Endoscopy;;  gastric biopsy    CHOLECYSTECTOMY N/A 03/09/2013   Procedure: LAPAROSCOPIC CHOLECYSTECTOMY WITH INTRAOPERATIVE CHOLANGIOGRAM;  Surgeon: Donnice POUR. Belinda, MD;  Location: [facility] OR;  Service: General;  Laterality: N/A;   COLONOSCOPY  06/2014   Dr. Obie: Three sessile polyps were found in the rectum x2 and descending. tubular adenomas. colonoscopy in 06/2019   ESOPHAGOGASTRODUODENOSCOPY (EGD) WITH PROPOFOL  N/A 08/21/2015   Dr. Harvey: normal esophagus, non-bleeding gastric ulcers/gastritis (H.pylori)    ESOPHAGOGASTRODUODENOSCOPY (EGD) WITH PROPOFOL  N/A 03/10/2017   Procedure: ESOPHAGOGASTRODUODENOSCOPY (EGD) WITH PROPOFOL ;  Surgeon: Harvey Margo CROME, MD;  Location: [facility];  Service: Endoscopy;  Laterality: N/A;  1:00pm-rescheduled to 10\16 @ 8:15am    UMBILICAL HERNIA REPAIR N/A 03/09/2013   Procedure: HERNIA REPAIR UMBILICAL ADULT;  Surgeon: Donnice POUR. Belinda, MD;  Location: [facility] OR;  Service: General;  Laterality: N/A;   Family History  Problem Relation Age of Onset   Heart disease Maternal Grandmother    Heart disease Maternal Grandfather    Heart disease Paternal Grandmother    Heart disease Paternal Grandfather    Cancer Cousin    Hypertension Mother    Colon cancer Maternal Uncle    Rectal cancer Neg Hx    Stomach cancer Neg Hx    Social History   Socioeconomic History    Marital status: Single    Spouse name: Not on file   Number of children: 1   Years of education: Not on file   Highest education level: Not on file  Occupational History   Occupation: retired  Tobacco Use   Smoking status: Every Day    Current packs/day: 0.20    Average packs/day: 0.2 packs/day for 45.5 years (9.1 ttl pk-yrs)    Types: Cigarettes    Start date: 05/26/1978   Smokeless tobacco: Never   Tobacco comments:    slowing down  Vaping Use   Vaping status: Never Used  Substance and Sexual Activity   Alcohol use: Yes    Alcohol/week: 7.0 standard drinks of alcohol    Types: 6 Cans of beer, 1 Standard drinks or equivalent per week  Comment: drinks daily - usually two 40oz beers per day   Drug use: No    Frequency: 1.0 times per week   Sexual activity: Not Currently    Birth control/protection: None  Other Topics Concern   Not on file  Social History Narrative   Daughter lives in [place]   Social Drivers of Health   Financial Resource Strain: Low Risk  (12/07/2023)   Overall Financial Resource Strain (CARDIA)    Difficulty of Paying Living Expenses: Not hard at all  Food Insecurity: No Food Insecurity (12/07/2023)   Hunger Vital Sign    Worried About Running Out of Food in the Last Year: Never true    Ran Out of Food in the Last Year: Never true  Transportation Needs: No Transportation Needs (12/07/2023)   PRAPARE - Administrator, Civil Service (Medical): No    Lack of Transportation (Non-Medical): No  Physical Activity: Insufficiently Active (12/07/2023)   Exercise Vital Sign    Days of Exercise per Week: 7 days    Minutes of Exercise per Session: 20 min  Stress: No Stress Concern Present (12/07/2023)   [employer] of Occupational Health - Occupational Stress Questionnaire    Feeling of Stress: Not at all  Social Connections: Socially Isolated (12/07/2023)   Social Connection and Isolation Panel    Frequency of Communication with Friends and Family:  More than three times a week    Frequency of Social Gatherings with Friends and Family: More than three times a week    Attends Religious Services: Never    Database administrator or Organizations: No    Attends Engineer, structural: Never    Marital Status: Never married    Tobacco Counseling Ready to quit: No Counseling given: Yes Tobacco comments: slowing down    Clinical Intake:  Pre-visit preparation completed: Yes  Pain : No/denies pain     Nutritional Risks: None Diabetes: No  Lab Results  Component Value Date   HGBA1C 5.1 11/17/2022   HGBA1C 5.7 (H) 10/24/2021   HGBA1C 5.9 (H) 03/11/2021     How often do you need to have someone help you when you read instructions, pamphlets, or other written materials from your doctor or pharmacy?: 1 - Never  Interpreter Needed?: No  Information entered by :: alia t/cma   Activities of Daily Living     12/07/2023    1:57 PM  In your present state of health, do you have any difficulty performing the following activities:  Hearing? 0  Vision? 1  Difficulty concentrating or making decisions? 0  Walking or climbing stairs? 0  Dressing or bathing? 0  Doing errands, shopping? 0  Preparing Food and eating ? N  Using the Toilet? N  In the past six months, have you accidently leaked urine? N  Do you have problems with loss of bowel control? N  Managing your Medications? N  Managing your Finances? N  Housekeeping or managing your Housekeeping? N    Patient Care Team: Zollie Lowers, MD as PCP - General (Family Medicine)  I have updated your Care Teams any recent Medical Services you may have received from other providers in the past year.     Assessment:   This is a routine wellness examination for Philip [name].  Hearing/Vision screen Hearing Screening - Comments:: Pt denies hearing dif Vision Screening - Comments:: Pt denies vision def w/glasses/pt has not been at least a year to have eye checked in  Goals  Addressed             This Visit's Progress    Reduce alcohol intake   On track      Depression Screen     12/07/2023    2:00 PM 09/25/2023    1:06 PM 12/29/2022   10:33 AM 12/04/2022    3:39 PM 11/17/2022    2:28 PM 12/03/2021    2:55 PM 10/24/2021   10:00 AM  PHQ 2/9 Scores  PHQ - 2 Score 1 0 0 0 0 0 0  PHQ- 9 Score 1 0         Fall Risk     12/07/2023    1:54 PM 09/25/2023    1:02 PM 12/29/2022   10:33 AM 12/04/2022    3:37 PM 11/17/2022    2:28 PM  Fall Risk   Falls in the past year? 0 0 0 0 0  Number falls in past yr: 0 0  0   Injury with Fall? 0 0  0   Risk for fall due to : No Fall Risks No Fall Risks  No Fall Risks   Follow up Falls evaluation completed Falls evaluation completed  Falls prevention discussed     MEDICARE RISK AT HOME:  Medicare Risk at Home Any stairs in or around the home?: No If so, are there any without handrails?: No Home free of loose throw rugs in walkways, pet beds, electrical cords, etc?: Yes Adequate lighting in your home to reduce risk of falls?: Yes Life alert?: No Use of a cane, walker or w/c?: No Grab bars in the bathroom?: No Shower chair or bench in shower?: No Elevated toilet seat or a handicapped toilet?: No  TIMED UP AND GO:  Was the test performed?  no  Cognitive Function: 6CIT completed        12/07/2023    2:03 PM 12/04/2022    3:40 PM 12/03/2021    2:56 PM  6CIT Screen  What Year? 0 points 0 points 0 points  What month? 0 points 0 points 0 points  What time? 0 points 0 points 0 points  Count back from 20 0 points 0 points 0 points  Months in reverse 4 points 0 points 4 points  Repeat phrase 10 points 0 points 6 points  Total Score 14 points 0 points 10 points    Immunizations Immunization History  Administered Date(s) Administered   Hepatitis B, ADULT 06/28/2015, 08/03/2015, 12/25/2015   Moderna SARS-COV2 Booster Vaccination 05/02/2020   Moderna Sars-Covid-2 Vaccination 09/08/2019, 10/11/2019   Pneumococcal  Polysaccharide-23 06/28/2015    Screening Tests Health Maintenance  Topic Date Due   Pneumococcal Vaccine: 50+ Years (2 of 2 - PCV) 06/27/2016   Zoster Vaccines- Shingrix (1 of 2) 12/26/2023 (Originally 10/21/2005)   DTaP/Tdap/Td (1 - Tdap) 09/24/2024 (Originally 10/22/1974)   Colonoscopy  09/24/2024 (Originally 07/22/2019)   COVID-19 Vaccine (4 - 2024-25 season) 12/22/2024 (Originally 01/25/2023)   INFLUENZA VACCINE  12/25/2023   Medicare Annual Wellness (AWV)  12/06/2024   Hepatitis B Vaccines  Completed   Hepatitis C Screening  Completed   HPV VACCINES  Aged Out   Meningococcal B Vaccine  Aged Out    Health Maintenance  Health Maintenance Due  Topic Date Due   Pneumococcal Vaccine: 50+ Years (2 of 2 - PCV) 06/27/2016   Health Maintenance Items Addressed: See Nurse Notes at the end of this note  Additional Screening:  Vision Screening: Recommended annual ophthalmology exams for  early detection of glaucoma and other disorders of the eye. Would you like a referral to an eye doctor? No    Dental Screening: Recommended annual dental exams for proper oral hygiene  Community Resource Referral / Chronic Care Management: CRR required this visit?  No   CCM required this visit?  No   Plan:    I have personally reviewed and noted the following in the patient's chart:   Medical and social history Use of alcohol, tobacco or illicit drugs  Current medications and supplements including opioid prescriptions. Patient is not currently taking opioid prescriptions. Functional ability and status Nutritional status Physical activity Advanced directives List of other physicians Hospitalizations, surgeries, and ER visits in previous 12 months Vitals Screenings to include cognitive, depression, and falls Referrals and appointments  In addition, I have reviewed and discussed with patient certain preventive protocols, quality metrics, and best practice recommendations. A written  personalized care plan for preventive services as well as general preventive health recommendations were provided to patient.   Philip [name], CMA   12/07/2023   After Visit Summary: (Declined) Due to this being a telephonic visit, with patients personalized plan was offered to patient but patient Declined AVS at this time   Notes: Nothing significant to report at this time.

## 2023-12-07 NOTE — Patient Instructions (Signed)
 Philip [name] , Thank you for taking time out of your busy schedule to complete your Annual Wellness Visit with me. I enjoyed our conversation and look forward to speaking with you again next year. I, as well as your care team,  appreciate your ongoing commitment to your health goals. Please review the following plan we discussed and let me know if I can assist you in the future. Your Game plan/ To Do List    Follow up Visits: Next Medicare AWV with our clinical staff: 12/07/24 at 3:50p.m.   Next Office Visit with your provider: 12/31/23 at 10:55a.m.  Clinician Recommendations:  Aim for 30 minutes of exercise or brisk walking, 6-8 glasses of water, and 5 servings of fruits and vegetables each day.       This is a list of the screening recommended for you and due dates:  Health Maintenance  Topic Date Due   Pneumococcal Vaccine for age over 54 (2 of 2 - PCV) 06/27/2016   Zoster (Shingles) Vaccine (1 of 2) 12/26/2023*   DTaP/Tdap/Td vaccine (1 - Tdap) 09/24/2024*   Colon Cancer Screening  09/24/2024*   COVID-19 Vaccine (4 - 2024-25 season) 12/22/2024*   Flu Shot  12/25/2023   Medicare Annual Wellness Visit  12/06/2024   Hepatitis B Vaccine  Completed   Hepatitis C Screening  Completed   HPV Vaccine  Aged Out   Meningitis B Vaccine  Aged Out  *Topic was postponed. The date shown is not the original due date.    Advanced directives: (Declined) Advance directive discussed with you today. Even though you declined this today, please call our office should you change your mind, and we can give you the proper paperwork for you to fill out. Advance Care Planning is important because it:  [x]  Makes sure you receive the medical care that is consistent with your values, goals, and preferences  [x]  It provides guidance to your family and loved ones and reduces their decisional burden about whether or not they are making the right decisions based on your wishes.  Follow the link provided in your after  visit summary or read over the paperwork we have mailed to you to help you started getting your Advance Directives in place. If you need assistance in completing these, please reach out to us  so that we can help you!  See attachments for Preventive Care and Fall Prevention Tips.

## 2023-12-10 ENCOUNTER — Emergency Department (HOSPITAL_COMMUNITY)

## 2023-12-10 ENCOUNTER — Inpatient Hospital Stay (HOSPITAL_COMMUNITY)

## 2023-12-10 ENCOUNTER — Inpatient Hospital Stay (HOSPITAL_COMMUNITY)
Admission: EM | Admit: 2023-12-10 | Discharge: 2023-12-17 | DRG: 304 | Disposition: A | Discharge status: Skilled Nursing Facility | Attending: Internal Medicine | Admitting: Internal Medicine

## 2023-12-10 DIAGNOSIS — F1410 Cocaine abuse, uncomplicated: Secondary | ICD-10-CM | POA: Diagnosis present

## 2023-12-10 DIAGNOSIS — L02811 Cutaneous abscess of head [any part, except face]: Secondary | ICD-10-CM | POA: Diagnosis present

## 2023-12-10 DIAGNOSIS — Z91148 Patient's other noncompliance with medication regimen for other reason: Secondary | ICD-10-CM

## 2023-12-10 DIAGNOSIS — R531 Weakness: Secondary | ICD-10-CM | POA: Diagnosis not present

## 2023-12-10 DIAGNOSIS — I6782 Cerebral ischemia: Secondary | ICD-10-CM | POA: Diagnosis not present

## 2023-12-10 DIAGNOSIS — N179 Acute kidney failure, unspecified: Secondary | ICD-10-CM | POA: Diagnosis not present

## 2023-12-10 DIAGNOSIS — G4733 Obstructive sleep apnea (adult) (pediatric): Secondary | ICD-10-CM | POA: Diagnosis not present

## 2023-12-10 DIAGNOSIS — I161 Hypertensive emergency: Secondary | ICD-10-CM | POA: Diagnosis present

## 2023-12-10 DIAGNOSIS — I6381 Other cerebral infarction due to occlusion or stenosis of small artery: Secondary | ICD-10-CM | POA: Diagnosis not present

## 2023-12-10 DIAGNOSIS — R4781 Slurred speech: Secondary | ICD-10-CM | POA: Diagnosis not present

## 2023-12-10 DIAGNOSIS — Z79899 Other long term (current) drug therapy: Secondary | ICD-10-CM | POA: Diagnosis not present

## 2023-12-10 DIAGNOSIS — R9082 White matter disease, unspecified: Secondary | ICD-10-CM | POA: Diagnosis not present

## 2023-12-10 DIAGNOSIS — D1802 Hemangioma of intracranial structures: Secondary | ICD-10-CM | POA: Diagnosis present

## 2023-12-10 DIAGNOSIS — I959 Hypotension, unspecified: Secondary | ICD-10-CM | POA: Diagnosis not present

## 2023-12-10 DIAGNOSIS — D696 Thrombocytopenia, unspecified: Secondary | ICD-10-CM | POA: Diagnosis present

## 2023-12-10 DIAGNOSIS — J9811 Atelectasis: Secondary | ICD-10-CM | POA: Diagnosis not present

## 2023-12-10 DIAGNOSIS — Z4682 Encounter for fitting and adjustment of non-vascular catheter: Secondary | ICD-10-CM | POA: Diagnosis not present

## 2023-12-10 DIAGNOSIS — Z87442 Personal history of urinary calculi: Secondary | ICD-10-CM

## 2023-12-10 DIAGNOSIS — G40909 Epilepsy, unspecified, not intractable, without status epilepticus: Secondary | ICD-10-CM

## 2023-12-10 DIAGNOSIS — G8929 Other chronic pain: Secondary | ICD-10-CM

## 2023-12-10 DIAGNOSIS — I169 Hypertensive crisis, unspecified: Secondary | ICD-10-CM | POA: Diagnosis present

## 2023-12-10 DIAGNOSIS — N529 Male erectile dysfunction, unspecified: Secondary | ICD-10-CM | POA: Diagnosis present

## 2023-12-10 DIAGNOSIS — G939 Disorder of brain, unspecified: Secondary | ICD-10-CM | POA: Diagnosis not present

## 2023-12-10 DIAGNOSIS — Z8619 Personal history of other infectious and parasitic diseases: Secondary | ICD-10-CM

## 2023-12-10 DIAGNOSIS — G40901 Epilepsy, unspecified, not intractable, with status epilepticus: Secondary | ICD-10-CM | POA: Diagnosis not present

## 2023-12-10 DIAGNOSIS — E617 Deficiency of multiple nutrient elements: Secondary | ICD-10-CM | POA: Diagnosis not present

## 2023-12-10 DIAGNOSIS — K219 Gastro-esophageal reflux disease without esophagitis: Secondary | ICD-10-CM | POA: Diagnosis not present

## 2023-12-10 DIAGNOSIS — F05 Delirium due to known physiological condition: Secondary | ICD-10-CM | POA: Diagnosis not present

## 2023-12-10 DIAGNOSIS — R404 Transient alteration of awareness: Secondary | ICD-10-CM | POA: Diagnosis not present

## 2023-12-10 DIAGNOSIS — F1490 Cocaine use, unspecified, uncomplicated: Secondary | ICD-10-CM | POA: Diagnosis not present

## 2023-12-10 DIAGNOSIS — I10 Essential (primary) hypertension: Secondary | ICD-10-CM

## 2023-12-10 DIAGNOSIS — E876 Hypokalemia: Secondary | ICD-10-CM | POA: Diagnosis present

## 2023-12-10 DIAGNOSIS — Z7401 Bed confinement status: Secondary | ICD-10-CM | POA: Diagnosis not present

## 2023-12-10 DIAGNOSIS — E785 Hyperlipidemia, unspecified: Secondary | ICD-10-CM | POA: Diagnosis present

## 2023-12-10 DIAGNOSIS — J9601 Acute respiratory failure with hypoxia: Secondary | ICD-10-CM | POA: Diagnosis present

## 2023-12-10 DIAGNOSIS — E1140 Type 2 diabetes mellitus with diabetic neuropathy, unspecified: Secondary | ICD-10-CM | POA: Diagnosis present

## 2023-12-10 DIAGNOSIS — G9341 Metabolic encephalopathy: Secondary | ICD-10-CM | POA: Diagnosis present

## 2023-12-10 DIAGNOSIS — R278 Other lack of coordination: Secondary | ICD-10-CM | POA: Diagnosis not present

## 2023-12-10 DIAGNOSIS — E8721 Acute metabolic acidosis: Secondary | ICD-10-CM

## 2023-12-10 DIAGNOSIS — E539 Vitamin B deficiency, unspecified: Secondary | ICD-10-CM | POA: Diagnosis not present

## 2023-12-10 DIAGNOSIS — G319 Degenerative disease of nervous system, unspecified: Secondary | ICD-10-CM | POA: Diagnosis not present

## 2023-12-10 DIAGNOSIS — G4489 Other headache syndrome: Secondary | ICD-10-CM | POA: Diagnosis not present

## 2023-12-10 DIAGNOSIS — Z743 Need for continuous supervision: Secondary | ICD-10-CM | POA: Diagnosis not present

## 2023-12-10 DIAGNOSIS — R569 Unspecified convulsions: Secondary | ICD-10-CM | POA: Diagnosis not present

## 2023-12-10 DIAGNOSIS — Z781 Physical restraint status: Secondary | ICD-10-CM

## 2023-12-10 DIAGNOSIS — E872 Acidosis, unspecified: Secondary | ICD-10-CM | POA: Diagnosis present

## 2023-12-10 DIAGNOSIS — K5900 Constipation, unspecified: Secondary | ICD-10-CM | POA: Diagnosis not present

## 2023-12-10 DIAGNOSIS — F10939 Alcohol use, unspecified with withdrawal, unspecified: Secondary | ICD-10-CM | POA: Diagnosis present

## 2023-12-10 DIAGNOSIS — Z8249 Family history of ischemic heart disease and other diseases of the circulatory system: Secondary | ICD-10-CM | POA: Diagnosis not present

## 2023-12-10 DIAGNOSIS — F17210 Nicotine dependence, cigarettes, uncomplicated: Secondary | ICD-10-CM | POA: Diagnosis present

## 2023-12-10 DIAGNOSIS — E119 Type 2 diabetes mellitus without complications: Secondary | ICD-10-CM | POA: Diagnosis not present

## 2023-12-10 DIAGNOSIS — I129 Hypertensive chronic kidney disease with stage 1 through stage 4 chronic kidney disease, or unspecified chronic kidney disease: Secondary | ICD-10-CM | POA: Diagnosis present

## 2023-12-10 DIAGNOSIS — B9561 Methicillin susceptible Staphylococcus aureus infection as the cause of diseases classified elsewhere: Secondary | ICD-10-CM | POA: Diagnosis present

## 2023-12-10 DIAGNOSIS — M792 Neuralgia and neuritis, unspecified: Secondary | ICD-10-CM | POA: Diagnosis not present

## 2023-12-10 DIAGNOSIS — E1165 Type 2 diabetes mellitus with hyperglycemia: Secondary | ICD-10-CM | POA: Diagnosis present

## 2023-12-10 DIAGNOSIS — Z8673 Personal history of transient ischemic attack (TIA), and cerebral infarction without residual deficits: Secondary | ICD-10-CM | POA: Diagnosis not present

## 2023-12-10 DIAGNOSIS — R519 Headache, unspecified: Secondary | ICD-10-CM

## 2023-12-10 DIAGNOSIS — R809 Proteinuria, unspecified: Secondary | ICD-10-CM | POA: Diagnosis present

## 2023-12-10 DIAGNOSIS — G8190 Hemiplegia, unspecified affecting unspecified side: Secondary | ICD-10-CM | POA: Diagnosis not present

## 2023-12-10 DIAGNOSIS — R4182 Altered mental status, unspecified: Secondary | ICD-10-CM | POA: Diagnosis present

## 2023-12-10 DIAGNOSIS — J9690 Respiratory failure, unspecified, unspecified whether with hypoxia or hypercapnia: Secondary | ICD-10-CM | POA: Diagnosis not present

## 2023-12-10 DIAGNOSIS — M6281 Muscle weakness (generalized): Secondary | ICD-10-CM | POA: Diagnosis not present

## 2023-12-10 DIAGNOSIS — M6259 Muscle wasting and atrophy, not elsewhere classified, multiple sites: Secondary | ICD-10-CM | POA: Diagnosis not present

## 2023-12-10 LAB — COMPREHENSIVE METABOLIC PANEL WITH GFR
ALT: 15 U/L (ref 0–44)
AST: 40 U/L (ref 15–41)
Albumin: 3.3 g/dL — ABNORMAL LOW (ref 3.5–5.0)
Alkaline Phosphatase: 126 U/L (ref 38–126)
Anion gap: 20 — ABNORMAL HIGH (ref 5–15)
BUN: 6 mg/dL — ABNORMAL LOW (ref 8–23)
CO2: 19 mmol/L — ABNORMAL LOW (ref 22–32)
Calcium: 8.8 mg/dL — ABNORMAL LOW (ref 8.9–10.3)
Chloride: 98 mmol/L (ref 98–111)
Creatinine, Ser: 1.28 mg/dL — ABNORMAL HIGH (ref 0.61–1.24)
GFR, Estimated: >60 mL/min (ref >60)
Glucose, Bld: 168 mg/dL — ABNORMAL HIGH (ref 70–99)
Potassium: 4.1 mmol/L (ref 3.5–5.1)
Sodium: 137 mmol/L (ref 135–145)
Total Bilirubin: 0.9 mg/dL (ref 0.0–1.2)
Total Protein: 8.1 g/dL (ref 6.5–8.1)

## 2023-12-10 LAB — MRSA NEXT GEN BY PCR, NASAL: MRSA by PCR Next Gen: NOT DETECTED

## 2023-12-10 LAB — DIFFERENTIAL
Abs Immature Granulocytes: 0.04 K/uL (ref 0.00–0.07)
Basophils Absolute: 0.1 K/uL (ref 0.0–0.1)
Basophils Relative: 1 %
Eosinophils Absolute: 1.2 K/uL — ABNORMAL HIGH (ref 0.0–0.5)
Eosinophils Relative: 14 %
Immature Granulocytes: 1 %
Lymphocytes Relative: 15 %
Lymphs Abs: 1.3 K/uL (ref 0.7–4.0)
Monocytes Absolute: 0.9 K/uL (ref 0.1–1.0)
Monocytes Relative: 10 %
Neutro Abs: 5.1 K/uL (ref 1.7–7.7)
Neutrophils Relative %: 59 %

## 2023-12-10 LAB — URINALYSIS, ROUTINE W REFLEX MICROSCOPIC
Bilirubin Urine: NEGATIVE
Glucose, UA: 50 mg/dL — AB
Ketones, ur: NEGATIVE mg/dL
Leukocytes,Ua: NEGATIVE
Nitrite: NEGATIVE
Protein, ur: 100 mg/dL — AB
Specific Gravity, Urine: 1.01 (ref 1.005–1.030)
pH: 6 (ref 5.0–8.0)

## 2023-12-10 LAB — CBC
HCT: 54.4 % — ABNORMAL HIGH (ref 39.0–52.0)
HCT: 55.3 % — ABNORMAL HIGH (ref 39.0–52.0)
Hemoglobin: 17.9 g/dL — ABNORMAL HIGH (ref 13.0–17.0)
Hemoglobin: 18.9 g/dL — ABNORMAL HIGH (ref 13.0–17.0)
MCH: 32.1 pg (ref 26.0–34.0)
MCH: 32.1 pg (ref 26.0–34.0)
MCHC: 32.9 g/dL (ref 30.0–36.0)
MCHC: 34.2 g/dL (ref 30.0–36.0)
MCV: 97.7 fL (ref 80.0–100.0)
MCV: 93.9 fL (ref 80.0–100.0)
Platelets: 120 K/uL — ABNORMAL LOW (ref 150–400)
Platelets: 129 K/uL — ABNORMAL LOW (ref 150–400)
RBC: 5.57 MIL/uL (ref 4.22–5.81)
RBC: 5.89 MIL/uL — ABNORMAL HIGH (ref 4.22–5.81)
RDW: 13.5 % (ref 11.5–15.5)
RDW: 13.5 % (ref 11.5–15.5)
WBC: 8.6 K/uL (ref 4.0–10.5)
WBC: 9.2 K/uL (ref 4.0–10.5)
nRBC: 0 % (ref 0.0–0.2)
nRBC: 0 % (ref 0.0–0.2)

## 2023-12-10 LAB — RAPID URINE DRUG SCREEN, HOSP PERFORMED
Amphetamines: NOT DETECTED
Barbiturates: NOT DETECTED
Benzodiazepines: POSITIVE — AB
Cocaine: POSITIVE — AB
Opiates: NOT DETECTED
Tetrahydrocannabinol: NOT DETECTED

## 2023-12-10 LAB — HEMOGLOBIN A1C
Hgb A1c MFr Bld: 5.1 % (ref 4.8–5.6)
Mean Plasma Glucose: 99.67 mg/dL

## 2023-12-10 LAB — GLUCOSE, CAPILLARY
Glucose-Capillary: 76 mg/dL (ref 70–99)
Glucose-Capillary: 110 mg/dL — ABNORMAL HIGH (ref 70–99)

## 2023-12-10 LAB — BLOOD GAS, ARTERIAL
Acid-Base Excess: 2.9 mmol/L — ABNORMAL HIGH (ref 0.0–2.0)
Bicarbonate: 26.1 mmol/L (ref 20.0–28.0)
Drawn by: 37588
O2 Saturation: 97.3 %
Patient temperature: 36.2
pCO2 arterial: 34 mmHg (ref 32–48)
pH, Arterial: 7.49 — ABNORMAL HIGH (ref 7.35–7.45)
pO2, Arterial: 74 mmHg — ABNORMAL LOW (ref 83–108)

## 2023-12-10 LAB — PROTIME-INR
INR: 1 (ref 0.8–1.2)
Prothrombin Time: 14.2 s (ref 11.4–15.2)

## 2023-12-10 LAB — CBG MONITORING, ED
Glucose-Capillary: 201 mg/dL — ABNORMAL HIGH (ref 70–99)
Glucose-Capillary: 97 mg/dL (ref 70–99)

## 2023-12-10 LAB — HIV ANTIBODY (ROUTINE TESTING W REFLEX): HIV Screen 4th Generation wRfx: NONREACTIVE

## 2023-12-10 LAB — CREATININE, SERUM
Creatinine, Ser: 1.4 mg/dL — ABNORMAL HIGH (ref 0.61–1.24)
GFR, Estimated: 55 mL/min — ABNORMAL LOW

## 2023-12-10 LAB — APTT: aPTT: 22 s — ABNORMAL LOW (ref 24–36)

## 2023-12-10 LAB — ETHANOL: Alcohol, Ethyl (B): <15 mg/dL (ref <15)

## 2023-12-10 MED ORDER — PROPOFOL 1000 MG/100ML IV EMUL
INTRAVENOUS | Status: AC
  Administered 2023-12-10: 10 ug/kg/min via INTRAVENOUS
  Filled 2023-12-10: qty 100

## 2023-12-10 MED ORDER — LORAZEPAM 2 MG/ML IJ SOLN: 2.0000 mg | Freq: Once | INTRAMUSCULAR | Status: AC

## 2023-12-10 MED ORDER — MIDAZOLAM HCL 2 MG/2ML IJ SOLN
INTRAMUSCULAR | Status: DC
Start: 2023-12-10 — End: 2023-12-10
  Filled 2023-12-10: qty 2

## 2023-12-10 MED ORDER — FAMOTIDINE 20 MG PO TABS: 20.0000 mg | ORAL_TABLET | Freq: Two times a day (BID) | ORAL | Status: DC

## 2023-12-10 MED ORDER — LABETALOL HCL 5 MG/ML IV SOLN: 10.0000 mg | INTRAVENOUS | Status: DC | PRN

## 2023-12-10 MED ORDER — FENTANYL 2500MCG IN NS 250ML (10MCG/ML) PREMIX INFUSION: 0.0000 ug/h | INTRAVENOUS | Status: DC

## 2023-12-10 MED ORDER — GLYCOPYRROLATE 0.2 MG/ML IJ SOLN
0.1000 mg | Freq: Once | INTRAMUSCULAR | Status: AC
  Administered 2023-12-10: 0.1 mg via INTRAVENOUS
  Filled 2023-12-10: qty 1

## 2023-12-10 MED ORDER — FENTANYL CITRATE (PF) 100 MCG/2ML IJ SOLN
INTRAMUSCULAR | Status: AC
  Filled 2023-12-10: qty 2

## 2023-12-10 MED ORDER — POLYETHYLENE GLYCOL 3350 17 G PO PACK: 17.0000 g | PACK | Freq: Every day | ORAL | Status: DC | PRN

## 2023-12-10 MED ORDER — VANCOMYCIN HCL 1500 MG/300ML IV SOLN
1500.0000 mg | Freq: Once | INTRAVENOUS | Status: AC
  Administered 2023-12-10: 1500 mg via INTRAVENOUS
  Filled 2023-12-10: qty 300

## 2023-12-10 MED ORDER — LEVETIRACETAM (KEPPRA) 500 MG/5 ML ADULT IV PUSH
3000.0000 mg | Freq: Once | INTRAVENOUS | Status: AC
  Administered 2023-12-10: 3000 mg via INTRAVENOUS
  Filled 2023-12-10: qty 30

## 2023-12-10 MED ORDER — SUCCINYLCHOLINE CHLORIDE 200 MG/10ML IV SOSY
PREFILLED_SYRINGE | INTRAVENOUS | Status: DC
Start: 2023-12-10 — End: 2023-12-10
  Filled 2023-12-10: qty 10

## 2023-12-10 MED ORDER — IOHEXOL 350 MG/ML SOLN
75.0000 mL | Freq: Once | INTRAVENOUS | Status: AC | PRN
  Administered 2023-12-10: 75 mL via INTRAVENOUS

## 2023-12-10 MED ORDER — DOCUSATE SODIUM 100 MG PO CAPS: 100.0000 mg | ORAL_CAPSULE | Freq: Two times a day (BID) | ORAL | Status: DC | PRN

## 2023-12-10 MED ORDER — DOCUSATE SODIUM 50 MG/5ML PO LIQD: 100.0000 mg | Freq: Two times a day (BID) | ORAL | Status: DC

## 2023-12-10 MED ORDER — LORAZEPAM 2 MG/ML IJ SOLN
INTRAMUSCULAR | Status: AC
  Administered 2023-12-10: 1 mg via INTRAVENOUS
  Filled 2023-12-10: qty 1

## 2023-12-10 MED ORDER — INSULIN ASPART 100 UNIT/ML IJ SOLN
0.0000 [IU] | INTRAMUSCULAR | Status: DC
  Administered 2023-12-11: 2 [IU] via SUBCUTANEOUS
  Administered 2023-12-11: 3 [IU] via SUBCUTANEOUS
  Administered 2023-12-11 – 2023-12-13 (×5): 2 [IU] via SUBCUTANEOUS
  Administered 2023-12-13: 3 [IU] via SUBCUTANEOUS
  Administered 2023-12-14 – 2023-12-16 (×7): 2 [IU] via SUBCUTANEOUS
  Administered 2023-12-16: 3 [IU] via SUBCUTANEOUS
  Administered 2023-12-17: 2 [IU] via SUBCUTANEOUS
  Administered 2023-12-17: 5 [IU] via SUBCUTANEOUS

## 2023-12-10 MED ORDER — CHLORHEXIDINE GLUCONATE CLOTH 2 % EX PADS
6.0000 | MEDICATED_PAD | Freq: Every day | CUTANEOUS | Status: DC
  Administered 2023-12-10 – 2023-12-17 (×8): 6 via TOPICAL

## 2023-12-10 MED ORDER — HEPARIN SODIUM (PORCINE) 5000 UNIT/ML IJ SOLN
5000.0000 [IU] | Freq: Three times a day (TID) | INTRAMUSCULAR | Status: DC
  Administered 2023-12-10 – 2023-12-17 (×21): 5000 [IU] via SUBCUTANEOUS
  Filled 2023-12-10 (×20): qty 1

## 2023-12-10 MED ORDER — LABETALOL HCL 5 MG/ML IV SOLN: 10.0000 mg | Freq: Once | INTRAVENOUS | Status: AC

## 2023-12-10 MED ORDER — SODIUM CHLORIDE 0.9 % IV SOLN
250.0000 mL | INTRAVENOUS | Status: AC
  Administered 2023-12-10: 250 mL via INTRAVENOUS

## 2023-12-10 MED ORDER — ROCURONIUM BROMIDE 10 MG/ML (PF) SYRINGE
PREFILLED_SYRINGE | INTRAVENOUS | Status: AC
  Filled 2023-12-10: qty 10

## 2023-12-10 MED ORDER — LORAZEPAM 2 MG/ML IJ SOLN
INTRAMUSCULAR | Status: AC
  Administered 2023-12-10: 2 mg via INTRAVENOUS
  Filled 2023-12-10: qty 1

## 2023-12-10 MED ORDER — NOREPINEPHRINE 4 MG/250ML-% IV SOLN
0.0000 ug/min | INTRAVENOUS | Status: DC
  Administered 2023-12-10: 5 ug/min via INTRAVENOUS
  Administered 2023-12-11: 10 ug/min via INTRAVENOUS
  Administered 2023-12-11: 8 ug/min via INTRAVENOUS
  Filled 2023-12-10 (×3): qty 250

## 2023-12-10 MED ORDER — ETOMIDATE 2 MG/ML IV SOLN
INTRAVENOUS | Status: DC | PRN
  Administered 2023-12-10: 20 mg via INTRAVENOUS

## 2023-12-10 MED ORDER — PANTOPRAZOLE SODIUM 40 MG IV SOLR
40.0000 mg | INTRAVENOUS | Status: DC
  Administered 2023-12-10 – 2023-12-16 (×7): 40 mg via INTRAVENOUS
  Filled 2023-12-10 (×7): qty 10

## 2023-12-10 MED ORDER — PROPOFOL BOLUS VIA INFUSION
50.0000 mg | INTRAVENOUS | Status: DC | PRN
  Administered 2023-12-10: 50 mg via INTRAVENOUS
  Filled 2023-12-10: qty 50

## 2023-12-10 MED ORDER — FAMOTIDINE 20 MG PO TABS
20.0000 mg | ORAL_TABLET | Freq: Two times a day (BID) | ORAL | Status: DC
  Filled 2023-12-10: qty 1

## 2023-12-10 MED ORDER — ORAL CARE MOUTH RINSE
15.0000 mL | OROMUCOSAL | Status: DC | PRN
Start: 2023-12-10 — End: 2023-12-11

## 2023-12-10 MED ORDER — LABETALOL HCL 5 MG/ML IV SOLN
INTRAVENOUS | Status: AC
  Administered 2023-12-10: 10 mg via INTRAVENOUS
  Filled 2023-12-10: qty 4

## 2023-12-10 MED ORDER — LEVETIRACETAM (KEPPRA) 500 MG/5 ML ADULT IV PUSH
1000.0000 mg | Freq: Once | INTRAVENOUS | Status: AC
  Administered 2023-12-10: 1000 mg via INTRAVENOUS
  Filled 2023-12-10: qty 10

## 2023-12-10 MED ORDER — ORAL CARE MOUTH RINSE
15.0000 mL | OROMUCOSAL | Status: DC
  Administered 2023-12-10 – 2023-12-11 (×13): 15 mL via OROMUCOSAL

## 2023-12-10 MED ORDER — LORAZEPAM 2 MG/ML IJ SOLN
2.0000 mg | INTRAMUSCULAR | Status: DC | PRN
  Administered 2023-12-14: 2 mg via INTRAVENOUS
  Filled 2023-12-10: qty 1

## 2023-12-10 MED ORDER — ROCURONIUM BROMIDE 10 MG/ML (PF) SYRINGE
PREFILLED_SYRINGE | INTRAVENOUS | Status: DC | PRN
  Administered 2023-12-10: 100 mg via INTRAVENOUS

## 2023-12-10 MED ORDER — PROPOFOL 1000 MG/100ML IV EMUL
0.0000 ug/kg/min | INTRAVENOUS | Status: DC
  Administered 2023-12-10: 50 ug/kg/min via INTRAVENOUS
  Administered 2023-12-10 – 2023-12-11 (×2): 20 ug/kg/min via INTRAVENOUS
  Filled 2023-12-10 (×3): qty 100

## 2023-12-10 MED ORDER — ETOMIDATE 2 MG/ML IV SOLN
INTRAVENOUS | Status: AC
  Filled 2023-12-10: qty 20

## 2023-12-10 MED ORDER — VANCOMYCIN HCL 1250 MG/250ML IV SOLN
1250.0000 mg | INTRAVENOUS | Status: AC
  Administered 2023-12-11 – 2023-12-15 (×5): 1250 mg via INTRAVENOUS
  Filled 2023-12-10 (×5): qty 250

## 2023-12-10 MED ORDER — FENTANYL 2500MCG IN NS 250ML (10MCG/ML) PREMIX INFUSION
INTRAVENOUS | Status: AC
  Administered 2023-12-10: 25 ug/h via INTRAVENOUS
  Filled 2023-12-10: qty 250

## 2023-12-10 MED ORDER — LORAZEPAM 2 MG/ML IJ SOLN: 1.0000 mg | Freq: Once | INTRAMUSCULAR | Status: AC

## 2023-12-10 NOTE — ED Notes (Signed)
 Report called to Bri, RN on 4N.

## 2023-12-10 NOTE — ED Provider Notes (Signed)
 [place] EMERGENCY DEPARTMENT AT [facility] Provider Note   CSN: [id number] Arrival date & time: 12/10/23  9243     Patient presents with: Seizures and Code Stroke   Philip [name] is a 68 y.o. male.    Seizures  This patient is a 69 year old male with a history of hypertension on multiple medications including metoprolol , hydralazine , amlodipine .  He is also on Topamax  and gabapentin  and atorvastatin .  Review of this patient's medical history shows that he was actually at his family doctor's office as recently as about 2-1/2 months ago being treated for hypertension, accelerated hypertension.  He has not been admitted to the hospital recently.  It appears that in 2022 he was admitted for a possible stroke.  I have reviewed the medical record including the MRI of the brain that was performed during that visit which showed that the patient was diagnosed with no evidence of recent ischemic or hemorrhagic events.  In the discharge summary it was noted that the patient does have a history of alcohol abuse, cirrhosis, peptic ulcer disease, he was also positive for cocaine on his drug screen at that time.  He presents today with acute onset of waking up this morning where he slept reportedly on a couch, he was not able to move his left side, when the paramedics found the patient he was unable to use his left arm and when they placed him on the stretcher he had tonic-clonic activity with decorticate posturing which lasted several minutes and resolved with a dose of 5 mg of midazolam .  The patient was deeply obtunded all the way to the hospital with some dry heaving, he was placed on a nonrebreather, he is unresponsive and not able to give any information.  Reportedly he was last normal at 11:00 last night.  He was noted to be severely hypertensive prehospital at about 220/110    Prior to Admission medications   Medication Sig Start Date End Date Taking? Authorizing Provider  amLODipine   (NORVASC ) 10 MG tablet Take 1 tablet (10 mg total) by mouth daily. 09/25/23 12/07/23  Joesph Annabella HERO, FNP  atorvastatin  (LIPITOR) 40 MG tablet Take 1 tablet (40 mg total) by mouth daily. for cholesterol. 11/17/22   Zollie Lowers, MD  gabapentin  (NEURONTIN ) 300 MG capsule Take 2 capsules (600 mg total) by mouth at bedtime. 11/17/22   Zollie Lowers, MD  hydrALAZINE  (APRESOLINE ) 50 MG tablet Take 1 tablet (50 mg total) by mouth 3 (three) times daily. 12/29/22   Zollie Lowers, MD  metoprolol  succinate (TOPROL -XL) 50 MG 24 hr tablet TAKE 1 TABLET BY MOUTH ONCE DAILY WITH OR IMMEDIATELY FOLLOWING A MEAL. A 11/17/22   Zollie Lowers, MD  Multiple Vitamin (MULTIVITAMIN WITH MINERALS) TABS tablet Take 1 tablet by mouth daily. 03/13/21   Ezenduka, Nkeiruka J, MD  pantoprazole  (PROTONIX ) 40 MG tablet Take 1 tablet (40 mg total) by mouth daily. 11/17/22   Zollie Lowers, MD  sildenafil  (REVATIO ) 20 MG tablet TAKE 2 TO 5 TABLETS BY MOUTH ONCE DAILY AS NEEDED FOR ERECTILE DYSFUNCTION 11/25/22   Zollie Lowers, MD  topiramate  (TOPAMAX ) 50 MG tablet Take 1 tablet by mouth twice daily 06/10/21   Zollie Lowers, MD    Allergies: Patient has no known allergies.    Review of Systems  Unable to perform ROS: Acuity of condition  Neurological:  Positive for seizures.    Updated Vital Signs BP (!) 163/101   Pulse 63   Temp (!) 97 F (36.1 C)  Resp 20   Ht 1.702 m (5' 7.01)   Wt 73.7 kg   SpO2 95%   BMI 25.45 kg/m   Physical Exam Vitals and nursing note reviewed.  Constitutional:      General: He is in acute distress.     Appearance: He is well-developed. He is ill-appearing and toxic-appearing.  HENT:     Head: Normocephalic and atraumatic.     Mouth/Throat:     Pharynx: No oropharyngeal exudate.     Comments: Patient's jaw is clenched, possibility of some blood on the inside of his mouth though I cannot see his tongue Eyes:     General: No scleral icterus.       Right eye: No discharge.        Left  eye: No discharge.     Conjunctiva/sclera: Conjunctivae normal.     Pupils: Pupils are equal, round, and reactive to light.  Neck:     Thyroid : No thyromegaly.     Vascular: No JVD.  Cardiovascular:     Rate and Rhythm: Regular rhythm. Tachycardia present.     Heart sounds: Normal heart sounds. No murmur heard.    No friction rub. No gallop.  Pulmonary:     Effort: Pulmonary effort is normal. No respiratory distress.     Breath sounds: Normal breath sounds. No wheezing or rales.  Abdominal:     General: Bowel sounds are normal. There is no distension.     Palpations: Abdomen is soft. There is no mass.     Tenderness: There is no abdominal tenderness.  Musculoskeletal:        General: No tenderness. Normal range of motion.     Cervical back: Normal range of motion and neck supple.     Right lower leg: No edema.     Left lower leg: No edema.  Lymphadenopathy:     Cervical: No cervical adenopathy.  Skin:    General: Skin is warm and dry.     Findings: No erythema or rash.  Neurological:     Comments: The patient is not able to follow commands, he appears to have some weakness on the left compared to the right but he is spontaneously moving both sides, the left side appears to be much weaker than the right side.  To painful stimuli his right side responds briskly, his left side is very slow to respond in the leg and does not respond in the arm.  There is a subtle left-sided facial droop.  He has eyes appear to be deviated to the left  Psychiatric:        Behavior: Behavior normal.     (all labs ordered are listed, but only abnormal results are displayed) Labs Reviewed  APTT - Abnormal; Notable for the following components:      Result Value   aPTT 22 (*)    All other components within normal limits  CBC - Abnormal; Notable for the following components:   Hemoglobin 17.9 (*)    HCT 54.4 (*)    Platelets 120 (*)    All other components within normal limits  DIFFERENTIAL -  Abnormal; Notable for the following components:   Eosinophils Absolute 1.2 (*)    All other components within normal limits  COMPREHENSIVE METABOLIC PANEL WITH GFR - Abnormal; Notable for the following components:   CO2 19 (*)    Glucose, Bld 168 (*)    BUN 6 (*)    Creatinine, Ser 1.28 (*)  Calcium  8.8 (*)    Albumin 3.3 (*)    Anion gap 20 (*)    All other components within normal limits  RAPID URINE DRUG SCREEN, HOSP PERFORMED - Abnormal; Notable for the following components:   Cocaine POSITIVE (*)    Benzodiazepines POSITIVE (*)    All other components within normal limits  URINALYSIS, ROUTINE W REFLEX MICROSCOPIC - Abnormal; Notable for the following components:   Glucose, UA 50 (*)    Hgb urine dipstick SMALL (*)    Protein, ur 100 (*)    Bacteria, UA RARE (*)    All other components within normal limits  BLOOD GAS, ARTERIAL - Abnormal; Notable for the following components:   pH, Arterial 7.49 (*)    pO2, Arterial 74 (*)    Acid-Base Excess 2.9 (*)    All other components within normal limits  CBG MONITORING, ED - Abnormal; Notable for the following components:   Glucose-Capillary 201 (*)    All other components within normal limits  ETHANOL  PROTIME-INR  I-STAT CHEM 8, ED    EKG: None  Radiology: CT HEAD CODE STROKE WO CONTRAST Result Date: 12/10/2023 CLINICAL DATA:  Code stroke. Neuro deficit, acute, stroke suspected. Altered mental status. On head. EXAM: CT ANGIOGRAPHY HEAD AND NECK TECHNIQUE: Multidetector CT imaging of the head and neck was performed using the standard protocol during bolus administration of intravenous contrast. Multiplanar CT image reconstructions and MIPs were obtained to evaluate the vascular anatomy. Carotid stenosis measurements (when applicable) are obtained utilizing NASCET criteria, using the distal internal carotid diameter as the denominator. RADIATION DOSE REDUCTION: This exam was performed according to the departmental  dose-optimization program which includes automated exposure control, adjustment of the mA and/or kV according to patient size and/or use of iterative reconstruction technique. CONTRAST:  75mL OMNIPAQUE  IOHEXOL  350 MG/ML SOLN COMPARISON:  Brain MRI 03/11/2021. MRA head 03/11/2021. MRA neck 03/11/2021. FINDINGS: CT HEAD FINDINGS Brain: Generalized cerebral atrophy. Lacunar infarcts within the bilateral thalami, new from the prior head CT of 03/10/2021 but otherwise age-indeterminate. Patchy and ill-defined hypoattenuation within the cerebral white matter, nonspecific but compatible with mild chronic small vessel ischemic disease. There is no acute intracranial hemorrhage. No demarcated cortical infarct. No extra-axial fluid collection. No evidence of an intracranial mass. No midline shift. Vascular: No hyperdense vessel. Skull: No calvarial fracture or aggressive osseous lesion. Sinuses/Orbits: No mass or acute finding within the imaged orbits. Mild mucosal thickening within the bilateral ethmoid sinuses. Other: Chronic left nasal bone fracture deformity. Nonspecific 3.4 x 1.8 cm ovoid cutaneous/subcutaneous lesion within the suboccipital scalp just to the right of midline. (Series 2, image 10). ASPECTS (Alberta Stroke Program Early CT Score) - Ganglionic level infarction (caudate, lentiform nuclei, internal capsule, insula, M1-M3 cortex): 7 - Supraganglionic infarction (M4-M6 cortex): 3 Total score (0-10 with 10 being normal): 10 Review of the MIP images confirms the above findings These results were called by telephone at the time of interpretation on 12/10/2023 at 9:12 am to provider [facility] , who verbally acknowledged these results. CTA NECK FINDINGS Aortic arch: Standard aortic branching. The visible portions of the thoracic aorta are normal in caliber. No hemodynamically significant innominate or proximal subclavian artery stenosis. Right carotid system: CCA and ICA patent within the neck without stenosis.  Minimal atherosclerotic plaque within the proximal ICA. Left carotid system: CCA and ICA patent within the neck without measurable stenosis. Mild atherosclerotic plaque within the proximal ICA. 4 mm medially projecting vascular protrusion arising from the distal  cervical ICA compatible with a pseudoaneurysm (for instance as seen on series 7, image 36). In retrospect, this was present on the prior MRA neck of 03/11/2021 and is unchanged in size since that time. Vertebral arteries: Codominant and patent within the neck without stenosis or significant atherosclerotic disease. Skeleton: No acute fracture or aggressive osseous lesion. Cervical spondylosis. Multilevel ventral osteophytes at the C2-C3 through T1-T2 levels. This includes bulky bridging ventral osteophytes at C4-C5, C5-C6, C6-C7 and C7-T1. Other neck: No neck mass or cervical lymphadenopathy. Upper chest: No consolidation within the imaged lung apices. Partially visualized support tubes. Review of the MIP images confirms the above findings CTA HEAD FINDINGS Anterior circulation: The intracranial internal carotid arteries are patent. The M1 middle cerebral arteries are patent. No M2 proximal branch occlusion or high-grade proximal stenosis. The anterior cerebral arteries are patent. No intracranial aneurysm is identified. Posterior circulation: The intracranial vertebral arteries are patent. The basilar artery is patent. The posterior cerebral arteries are patent. 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 Severe, near-occlusive stenosis within the left posterior cerebral artery at the P2/P3 junction, also new from the prior MRA (series 11, image 28). Posterior communicating arteries are diminutive or absent, bilaterally. Venous sinuses: Limited assessment for dural venous sinus thrombosis due to contrast timing. Anatomic variants: As described. Review of the MIP images  confirms the above findings CTA head impressions #1 and #2 were called by telephone at the time of interpretation on 12/10/2023 at 9:32 am to provider [facility] , who verbally acknowledged these results. IMPRESSION: Non-contrast head CT: 1. Lacunar infarcts within the bilateral thalami which are new from the prior head CT of 03/10/2021, but otherwise age-indeterminate. Consider a brain MRI for further evaluation. 2. Background parenchymal atrophy and chronic small vessel ischemic disease. 3. Nonspecific 3.4 x 1.8 cm ovoid cutaneous/subcutaneous lesion within the suboccipital scalp just to the right of midline. Direct examination recommended. CTA neck: 1. The common carotid and internal carotid arteries are patent within the neck without measurable stenosis. Mild atherosclerotic plaque bilaterally, as described. 2. 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 3. Vertebral arteries patent within the neck without stenosis or significant atherosclerotic disease. CT head: 1. 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 2. Severe stenosis within the right posterior cerebral artery P2 segment, new from the prior MRA. Electronically Signed   By: Rockey Childs D.O.   On: 12/10/2023 09:34   CT ANGIO HEAD NECK W WO CM Result Date: 12/10/2023 CLINICAL DATA:  Code stroke. Neuro deficit, acute, stroke suspected. Altered mental status. On head. EXAM: CT ANGIOGRAPHY HEAD AND NECK TECHNIQUE: Multidetector CT imaging of the head and neck was performed using the standard protocol during bolus administration of intravenous contrast. Multiplanar CT image reconstructions and MIPs were obtained to evaluate the vascular anatomy. Carotid stenosis measurements (when applicable) are obtained utilizing NASCET criteria, using the distal internal carotid diameter as the  denominator. RADIATION DOSE REDUCTION: This exam was performed according to the departmental dose-optimization program which includes automated exposure control, adjustment of the mA and/or kV according to patient size and/or use of iterative reconstruction technique. CONTRAST:  75mL OMNIPAQUE  IOHEXOL  350 MG/ML SOLN COMPARISON:  Brain MRI 03/11/2021. MRA head 03/11/2021. MRA neck 03/11/2021. FINDINGS: CT HEAD FINDINGS Brain: Generalized cerebral  atrophy. Lacunar infarcts within the bilateral thalami, new from the prior head CT of 03/10/2021 but otherwise age-indeterminate. Patchy and ill-defined hypoattenuation within the cerebral white matter, nonspecific but compatible with mild chronic small vessel ischemic disease. There is no acute intracranial hemorrhage. No demarcated cortical infarct. No extra-axial fluid collection. No evidence of an intracranial mass. No midline shift. Vascular: No hyperdense vessel. Skull: No calvarial fracture or aggressive osseous lesion. Sinuses/Orbits: No mass or acute finding within the imaged orbits. Mild mucosal thickening within the bilateral ethmoid sinuses. Other: Chronic left nasal bone fracture deformity. Nonspecific 3.4 x 1.8 cm ovoid cutaneous/subcutaneous lesion within the suboccipital scalp just to the right of midline. (Series 2, image 10). ASPECTS (Alberta Stroke Program Early CT Score) - Ganglionic level infarction (caudate, lentiform nuclei, internal capsule, insula, M1-M3 cortex): 7 - Supraganglionic infarction (M4-M6 cortex): 3 Total score (0-10 with 10 being normal): 10 Review of the MIP images confirms the above findings These results were called by telephone at the time of interpretation on 12/10/2023 at 9:12 am to provider [facility] , who verbally acknowledged these results. CTA NECK FINDINGS Aortic arch: Standard aortic branching. The visible portions of the thoracic aorta are normal in caliber. No hemodynamically significant innominate or proximal subclavian  artery stenosis. Right carotid system: CCA and ICA patent within the neck without stenosis. Minimal atherosclerotic plaque within the proximal ICA. Left carotid system: CCA and ICA patent within the neck without measurable stenosis. Mild atherosclerotic plaque within the proximal ICA. 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 In retrospect, this was present on the prior MRA neck of 03/11/2021 and is unchanged in size since that time. Vertebral arteries: Codominant and patent within the neck without stenosis or significant atherosclerotic disease. Skeleton: No acute fracture or aggressive osseous lesion. Cervical spondylosis. Multilevel ventral osteophytes at the C2-C3 through T1-T2 levels. This includes bulky bridging ventral osteophytes at C4-C5, C5-C6, C6-C7 and C7-T1. Other neck: No neck mass or cervical lymphadenopathy. Upper chest: No consolidation within the imaged lung apices. Partially visualized support tubes. Review of the MIP images confirms the above findings CTA HEAD FINDINGS Anterior circulation: The intracranial internal carotid arteries are patent. The M1 middle cerebral arteries are patent. No M2 proximal branch occlusion or high-grade proximal stenosis. The anterior cerebral arteries are patent. No intracranial aneurysm is identified. Posterior circulation: The intracranial vertebral arteries are patent. The basilar artery is patent. The posterior cerebral arteries are patent. 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 Severe, near-occlusive stenosis within the left posterior cerebral artery at the P2/P3 junction, also new from the prior MRA (series 11, image 28). Posterior communicating arteries are diminutive or absent, bilaterally. Venous sinuses: Limited assessment for dural venous sinus  thrombosis due to contrast timing. Anatomic variants: As described. Review of the MIP images confirms the above findings CTA head impressions #1 and #2 were called by telephone at the time of interpretation on 12/10/2023 at 9:32 am to provider [facility] , who verbally acknowledged these results. IMPRESSION: Non-contrast head CT: 1. Lacunar infarcts within the bilateral thalami which are new from the prior head CT of 03/10/2021, but otherwise age-indeterminate. Consider a brain MRI for further evaluation. 2. Background parenchymal atrophy and chronic small vessel ischemic disease. 3. Nonspecific 3.4 x 1.8 cm ovoid cutaneous/subcutaneous lesion within the suboccipital scalp just to the  right of midline. Direct examination recommended. CTA neck: 1. The common carotid and internal carotid arteries are patent within the neck without measurable stenosis. Mild atherosclerotic plaque bilaterally, as described. 2. 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 3. Vertebral arteries patent within the neck without stenosis or significant atherosclerotic disease. CT head: 1. 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 2. Severe stenosis within the right posterior cerebral artery P2 segment, new from the prior MRA. Electronically Signed   By: Rockey Childs D.O.   On: 12/10/2023 09:34   DG Chest Portable 1 View Result Date: 12/10/2023 CLINICAL DATA:  Endotracheal tube placement. EXAM: PORTABLE CHEST 1 VIEW COMPARISON:  None recent.  Radiographs 01/24/2013. FINDINGS: 0826 hours. Tip of the endotracheal tube is approximately 3.2 cm above the carina. Enteric tube projects below the diaphragm, tip not visualized. The heart size and mediastinal contours are stable. The lungs appear clear. The right costophrenic angle is incompletely visualized. No evidence of  pneumothorax or large pleural effusion. Mild degenerative changes of the spine without evidence of acute osseous abnormality. IMPRESSION: Satisfactory position of endotracheal and enteric tubes. No evidence of acute cardiopulmonary process. Electronically Signed   By: Elsie Perone M.D.   On: 12/10/2023 08:47     .Critical Care  Performed by: Cleotilde Rogue, MD Authorized by: Cleotilde Rogue, MD   Critical care provider statement:    Critical care time (minutes):  75   Critical care time was exclusive of:  Separately billable procedures and treating other patients and teaching time   Critical care was necessary to treat or prevent imminent or life-threatening deterioration of the following conditions:  Respiratory failure and CNS failure or compromise   Critical care was time spent personally by me on the following activities:  Development of treatment plan with patient or surrogate, discussions with consultants, evaluation of patient's response to treatment, examination of patient, obtaining history from patient or surrogate, review of old charts, re-evaluation of patient's condition, pulse oximetry, ordering and review of radiographic studies, ordering and review of laboratory studies and ordering and performing treatments and interventions   I assumed direction of critical care for this patient from another provider in my specialty: no     Care discussed with: admitting provider   Comments:       Procedure Name: Intubation Date/Time: 12/10/2023 8:25 AM  Performed by: Cleotilde Rogue, MDPre-anesthesia Checklist: Patient identified, Patient being monitored, Emergency Drugs available, Timeout performed and Suction available Oxygen Delivery Method: Non-rebreather mask Preoxygenation: Pre-oxygenation with 100% oxygen Induction Type: Rapid sequence Ventilation: Mask ventilation without difficulty Laryngoscope Size: Mac and 4 Tube size: 7.5 mm Number of attempts: 1 Airway Equipment and Method:  Stylet Placement Confirmation: ETT inserted through vocal cords under direct vision, CO2 detector and Breath sounds checked- equal and bilateral Secured at: 24 cm Tube secured with: ETT holder Dental Injury: Teeth and Oropharynx as per pre-operative assessment  Difficulty Due To: Difficulty was unanticipated Comments:         Medications Ordered in the ED  rocuronium  (ZEMURON ) injection ( Intravenous Canceled Entry 12/10/23 0815)  etomidate  (AMIDATE ) injection ( Intravenous Canceled Entry 12/10/23 0815)  propofol  (DIPRIVAN ) 1000 MG/100ML infusion (20 mcg/kg/min  76.7 kg Intravenous Rate/Dose Change 12/10/23 0925)  levETIRAcetam  (KEPPRA ) undiluted injection 1,000 mg (1,000 mg Intravenous Given 12/10/23 0825)  LORazepam  (ATIVAN ) injection 1 mg (  1 mg Intravenous Given 12/10/23 0820)  labetalol  (NORMODYNE ) injection 10 mg (10 mg Intravenous Given 12/10/23 0822)  iohexol  (OMNIPAQUE ) 350 MG/ML injection 75 mL (75 mLs Intravenous Contrast Given 12/10/23 0859)  levETIRAcetam  (KEPPRA ) undiluted injection 3,000 mg (3,000 mg Intravenous Given 12/10/23 0944)  glycopyrrolate  (ROBINUL ) injection 0.1 mg (0.1 mg Intravenous Given 12/10/23 0943)                                    Medical Decision Making Amount and/or Complexity of Data Reviewed Labs: ordered. Radiology: ordered.  Risk Prescription drug management. Decision regarding hospitalization.    This patient presents to the ED for concern of left-sided weakness and seizure activity, this involves an extensive number of treatment options, and is a complaint that carries with it a high risk of complications and morbidity.  The differential diagnosis includes hemorrhage, stroke, alcohol withdrawal, hyponatremia, severe hypertensive emergency Unfortunately on arrival the patient is dry heaving unresponsive and not protecting his airway requiring intubation.   Co morbidities / Chronic conditions that complicate the patient  evaluation  Uncontrolled hypertension, substance abuse   Additional history obtained:  Additional history obtained from EMR External records from outside source obtained and reviewed including medical record, see history above   Lab Tests:  I Ordered, and personally interpreted labs.  The pertinent results include: Cocaine positive, urinalysis unremarkable, drug screen positive for cocaine and benzodiazepines, alcohol undetectable, leukocytosis absent, hemoconcentrated with high hemoglobin at 17.9, chronic thrombocytopenia compared to prior labs, metabolic panel with baseline creatinine, CO2 is slightly low at 19, glucose of 200, The patient has normal blood sugar at just over 200   Imaging Studies ordered:  I ordered imaging studies including chest x-ray to confirm tube placement and CT scan of the brain I independently visualized and interpreted imaging which showed no findings of acute ischemic stroke though there is some new bilateral ischemic findings, no hemorrhage, chest x-ray shows tubes in the correct place, no other acute process I agree with the radiologist interpretation   Cardiac Monitoring: / EKG:  The patient was maintained on a cardiac monitor.  I personally viewed and interpreted the cardiac monitored which showed an underlying rhythm of: Sinus tachycardia   Problem List / ED Course / Critical interventions / Medication management  The patient presents severely hypertensive currently about 250/130, he required multiple different medications to help improve his blood pressure, this included labetalol , he was intubated with rocuronium  and etomidate  and placed on a propofol  drip, he was given Ativan  and Keppra  as well I ordered medication including as above After discussion with the neurohospitalist they recommend 60 mg/kg of Keppra  which I have ordered, they recommend ICU admission at [facility] and they will see the patient in consultation Reevaluation of the patient after  these medicines showed that the patient critically ill, intubated I have reviewed the patients home medicines and have made adjustments as needed Recheck blood pressure after meds given as above at 115/80   Consultations Obtained:  I requested consultation with the Dr. Michaela,  and discussed lab and imaging findings as well as pertinent plan - they recommend: As above Discussed with Dr. Theophilus with ICU who has accepted the patient in transfer   Social Determinants of Health:  Substance abuse   Test / Admission - Considered:  Admit to ICU      Final diagnoses:  Hypertensive emergency  Status epilepticus (HCC)  Cocaine abuse (HCC)  ED Discharge Orders     None          Cleotilde Rogue, MD 12/10/23 1005

## 2023-12-10 NOTE — Progress Notes (Signed)
LTM EEG hooked up and running - no initial skin breakdown - push button tested - [facility] monitoring. MRI safe leads used

## 2023-12-10 NOTE — ED Notes (Signed)
Pt. Transported to CT and back without incident. 

## 2023-12-10 NOTE — ED Notes (Signed)
 Portable CXR at bedside.

## 2023-12-10 NOTE — ED Triage Notes (Signed)
 Pt BIB [facility] from home where he initially called out for L sided weakness and slurred speech. Per EMS, when they got him to their stretcher he became unresponsive, began having tonic-clonic seizure like activity and shortly after had decorticate posturing.   Pt arrives to ED unresponsive, GCS 3, on NRB at 15L. MD, RT, RN X 3 in room on arrival, decision made to intubate by Dr. Cleotilde for airway protection.    LKN 7/16 at 2300

## 2023-12-10 NOTE — Progress Notes (Signed)
 Pharmacy Antibiotic Note  Philip [name] is a 68 y.o. male admitted on 12/10/2023 with wound infection / head abscess.  Pharmacy has been consulted for vancomycin  dosing.  Scr 1.28 (baseline ~1.1).   Plan: Vancomycin  1500 mg x1 followed by 1250 q24h for AUC of 496   Height: 5' 7.01 (170.2 cm) Weight: 73.7 kg (162 lb 8 oz) IBW/kg (Calculated) : 66.12  Temp (24hrs), Avg:97.3 F (36.3 C), Min:96.9 F (36.1 C), Max:97.8 F (36.6 C)  Recent Labs  Lab 12/10/23 0810  WBC 8.6  CREATININE 1.28*    Estimated Creatinine Clearance: 51.6 mL/min (A) (by C-G formula based on SCr of 1.28 mg/dL (H)).    No Known Allergies  Antimicrobials this admission: Vancomycin  7/17 >> c  Microbiology results: 7/17 Sputum: pending  7/17 Abscess: pending  7/17 MRSA PCR: pending  Thank you for allowing pharmacy to be a part of this patient's care.  Rankin Sams 12/10/2023 2:41 PM

## 2023-12-10 NOTE — ED Notes (Signed)
[facility] arrived to transport patient to [facility].

## 2023-12-10 NOTE — Consult Note (Signed)
 NEUROLOGY CONSULT NOTE   Date of service: December 10, 2023 Patient Name: Philip [name] MRN:  [id number] DOB:  1955-08-29 Chief Complaint: Seizure Requesting Provider: Gretta Leita SQUIBB, DO  History of Present Illness  Philip [name] is a 68 y.o. male with hx of HTN, Dm, CVA, HTN, Seizures, and GERD who initially presented to [facility] ED via EMS who were initially called out for left side weakness and slurred speech, however per EMS when they moved him to their stretcher he began having tonic clonic seizure like activity with decorticate posturing and because unresponsive. Received 5mg  midazolam  by EMS with cessation of seizure activity.  On arrival to [facility] ED he was intubated and then loaded with keppra  4g prior to transfer to [facility].  Highest BP 227/138 with an episode of hypotension SBP 80s with transport. On arrival to [facility] PCCM reported decorticate movement in lower extremities with cough. Past medical notes report history of stroke in 2014 and one seizure in 2011 with no documented reoccurrence. It does appear he was on Dilantin  until 2014. He is now on topamax  for chronic headaches and gabapentin  for neuropathy.   Last seen in his normal state of health 7/16 at 2300.    ROS   Unable to ascertain due to patient is intubated and sedated  Past History   Past Medical History:  Diagnosis Date   Chronic cholecystitis with calculus 02/04/2013   CVA (cerebral vascular accident) 09/16/2012   Diabetes mellitus without complication (HCC)    borderline   Erectile dysfunction    Gallstones 01/27/2013   GERD (gastroesophageal reflux disease)    H. pylori infection 09/28/2017   Headache(784.0)    Headache, I dont think they are migranes   Hepatitis C    Harvoni , completed 06/2015 (Dr. Efrain) , AUG 2017 SVR   History of kidney stones    history of kidney stones   History of migraine    takes Topamax  daily   Hypertension    takes Hydralazine ,Maxzide ,Clonidine ,and Amlodipine  daily   Hypertension    also  takes Metoprolol  daily   Nausea    takes Zofran  daily   Seizures (HCC)    off Dilantin  daily.  last one was when he had a stroke.   Short-term memory loss    due to stroke    Sleep apnea    CPAP- tries to do it everyday.   Stroke (HCC)    .  Short term memory loss.; left sided weakness   Tobacco user    Umbilical hernia 02/04/2013    Past Surgical History:  Procedure Laterality Date   BIOPSY  03/10/2017   Procedure: BIOPSY;  Surgeon: Harvey Margo CROME, MD;  Location: [facility];  Service: Endoscopy;;  gastric biopsy    CHOLECYSTECTOMY N/A 03/09/2013   Procedure: LAPAROSCOPIC CHOLECYSTECTOMY WITH INTRAOPERATIVE CHOLANGIOGRAM;  Surgeon: Donnice POUR. Belinda, MD;  Location: [facility] OR;  Service: General;  Laterality: N/A;   COLONOSCOPY  06/2014   Dr. Obie: Three sessile polyps were found in the rectum x2 and descending. tubular adenomas. colonoscopy in 06/2019   ESOPHAGOGASTRODUODENOSCOPY (EGD) WITH PROPOFOL  N/A 08/21/2015   Dr. Harvey: normal esophagus, non-bleeding gastric ulcers/gastritis (H.pylori)    ESOPHAGOGASTRODUODENOSCOPY (EGD) WITH PROPOFOL  N/A 03/10/2017   Procedure: ESOPHAGOGASTRODUODENOSCOPY (EGD) WITH PROPOFOL ;  Surgeon: Harvey Margo CROME, MD;  Location: [facility];  Service: Endoscopy;  Laterality: N/A;  1:00pm-rescheduled to 10\16 @ 8:15am    UMBILICAL HERNIA REPAIR N/A 03/09/2013   Procedure: HERNIA REPAIR UMBILICAL ADULT;  Surgeon:  Donnice POUR. Belinda, MD;  Location: [facility] OR;  Service: General;  Laterality: N/A;    Family History: Family History  Problem Relation Age of Onset   Heart disease Maternal Grandmother    Heart disease Maternal Grandfather    Heart disease Paternal Grandmother    Heart disease Paternal Grandfather    Cancer Cousin    Hypertension Mother    Colon cancer Maternal Uncle    Rectal cancer Neg Hx    Stomach cancer Neg Hx     Social History  reports that he has been smoking cigarettes. He started smoking about 45 years ago. He has a 9.1 pack-year  smoking history. He has never used smokeless tobacco. He reports current alcohol use of about 7.0 standard drinks of alcohol per week. He reports that he does not use drugs.  No Known Allergies  Medications   Current Facility-Administered Medications:    0.9 %  sodium chloride  infusion, 250 mL, Intravenous, Continuous, Whiteheart, Lamarr LABOR, NP   Chlorhexidine  Gluconate Cloth 2 % PADS 6 each, 6 each, Topical, Daily, Gretta Leita SQUIBB, DO   docusate (COLACE) 50 MG/5ML liquid 100 mg, 100 mg, Per Tube, BID, Gretta, Laura P, DO   famotidine  (PEPCID ) tablet 20 mg, 20 mg, Per Tube, BID, Whiteheart, Lamarr LABOR, NP   fentaNYL  in NS 250mL (86mcg/ml) infusion-PREMIX, 0-400 mcg/hr, Intravenous, Continuous, Cleotilde Rogue, MD, Last Rate: 5 mL/hr at 12/10/23 1306, 50 mcg/hr at 12/10/23 1306   heparin  injection 5,000 Units, 5,000 Units, Subcutaneous, Q8H, Whiteheart, Kathryn A, NP   insulin  aspart (novoLOG ) injection 0-15 Units, 0-15 Units, Subcutaneous, Q4H, Whiteheart, Kathryn A, NP   labetalol  (NORMODYNE ) injection 10 mg, 10 mg, Intravenous, Q2H PRN, Whiteheart, Lamarr LABOR, NP   LORazepam  (ATIVAN ) injection 2 mg, 2 mg, Intravenous, Q4H PRN, Whiteheart, Lamarr LABOR, NP   norepinephrine  (LEVOPHED ) 4mg  in (0.016 mg/mL) premix infusion, 0-10 mcg/min, Intravenous, Titrated, Whiteheart, Lamarr LABOR, NP   Oral care mouth rinse, 15 mL, Mouth Rinse, Q2H, Clark, Leita SQUIBB, DO   Oral care mouth rinse, 15 mL, Mouth Rinse, PRN, Gretta Leita P, DO   polyethylene glycol (MIRALAX  / GLYCOLAX ) packet 17 g, 17 g, Per Tube, Daily PRN, Whiteheart, Lamarr LABOR, NP   propofol  (DIPRIVAN ) 1000 MG/100ML infusion, 0-80 mcg/kg/min, Intravenous, Continuous, Cleotilde Rogue, MD, Last Rate: 18.41 mL/hr at 12/10/23 1306, 40 mcg/kg/min at 12/10/23 1306   [START ON 12/11/2023] vancomycin  (VANCOREADY) IVPB 1250 mg/250 mL, 1,250 mg, Intravenous, Q24H, Clark, Laura P, DO   vancomycin  (VANCOREADY) IVPB 1500 mg/300 mL, 1,500 mg, Intravenous, Once,  Gretta Leita SQUIBB, DO  Vitals   Vitals:   12/10/23 1300 12/10/23 1305 12/10/23 1310 12/10/23 1315  BP: 96/73 (!) 88/72 93/72 105/81  Pulse:      Resp: 20 17 19 19   Temp: 97.7 F (36.5 C) 97.7 F (36.5 C) 97.7 F (36.5 C) 97.8 F (36.6 C)  SpO2:      Weight:      Height:        Body mass index is 25.45 kg/m.   Physical Exam   Constitutional: Appears well-developed and well-nourished.  Psych: Sedated Eyes: Disconjugate gaze HENT: ETT in place Head: Normocephalic.  Cardiovascular: Normal rate and regular rhythm.  Respiratory: Mechanically ventilated GI: Soft.  No distension.  Skin: WDI.   Neurologic Examination   Neuro: Mental Status: Patient is intubated, sedated.  Not following commands.  No verbal output Cranial Nerves: II: Pupils are equal 2mm and sluggish, does not blink to threat. Corneal  intact.  III,IV, VI: no gaze preference or deviation VII: Facial movement is obstructed by ETT  VIII: Hearing is intact to voice X: Cough intact XI: Head is midline XII: Obscured by ETT Motor/Sensory: Tone is normal. Bulk is normal.  purposeful movement with noxious stimuli in BL uppers. Withdraws BL lower extremities to babinskis. Patient is currently sedated with propofol    Labs/Imaging/Neurodiagnostic studies   CBC:  Recent Labs  Lab 09-Jan-2024 0810  WBC 8.6  NEUTROABS 5.1  HGB 17.9*  HCT 54.4*  MCV 97.7  PLT 120*   Basic Metabolic Panel:  Lab Results  Component Value Date   NA 137 01-09-2024   K 4.1 01-09-2024   CO2 19 (L) 09-Jan-2024   GLUCOSE 168 (H) 09-Jan-2024   BUN 6 (L) 01/09/24   CREATININE 1.28 (H) Jan 09, 2024   CALCIUM  8.8 (L) Jan 09, 2024   GFRNONAA >60 01/09/24   GFRAA 58 (L) 01/09/2020   Lipid Panel:  Lab Results  Component Value Date   LDLCALC 57 11/17/2022   HgbA1c:  Lab Results  Component Value Date   HGBA1C 5.1 11/17/2022   Urine Drug Screen:     Component Value Date/Time   LABOPIA NONE DETECTED January 09, 2024 0822    COCAINSCRNUR POSITIVE (A) 01-09-24 0822   LABBENZ POSITIVE (A) 01/09/24 0822   AMPHETMU NONE DETECTED 01/09/2024 0822   THCU NONE DETECTED 09-Jan-2024 0822   LABBARB NONE DETECTED 01-09-2024 0822    Alcohol Level     Component Value Date/Time   [facility] <15 01-09-2024 0810   INR  Lab Results  Component Value Date   INR 1.0 01-09-24   APTT  Lab Results  Component Value Date   APTT 22 (L) 09-Jan-2024   AED levels:  Lab Results  Component Value Date   PHENYTOIN  3.0 (L) 01/31/2013    CT Head without contrast(Personally reviewed): Lacunar infarcts within the bilateral thalami which are new from the prior head CT of 03/10/2021, but otherwise age-indeterminate.  CT angio Head and Neck with contrast(Personally reviewed): 1. 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 2. Severe stenosis within the right posterior cerebral artery P2 segment, new from the prior MRA.  Neurodiagnostics cEEG:  Pending   ASSESSMENT   AYYAN SITES is a 68 y.o. male with hx of HTN, Dm, CVA, HTN, Seizures, and GERD who initially presented to [facility] ED via EMS who were initially called out for left side weakness and slurred speech, however per EMS when they moved him to their stretcher he began having tonic clonic seizure like activity with decorticate posturing. Received 5mg  midazolam  by EMS with cessation of seizure activity. Intubated at [facility] ED and transferred to [facility]. He was loaded with keppra  4g prior to transfer to [facility].  Highest BP 227/138 with an episode of hypotension SBP 80s with transport.   RECOMMENDATIONS  - cEEG - MRI brain w wo  - Keppra  4000mg  load, then 500 mg BID. ______________________________________________________________________  Bonney Jorene Last, NP [facility]   NEUROHOSPITALIST ADDENDUM Performed a face to face diagnostic evaluation.   I have reviewed the contents of history and  physical exam as documented by PA/ARNP/Resident and agree with above documentation.  I have discussed and formulated the above plan as documented. Edits to the note have been made as needed.  Impression/Key exam findings/Plan: intubated and sedated, non focal with intact brainstem reflexes and localizes BL Uppers and withdraws BL lowers. No seizures on breif review  on LTM EEG overnight.  Continue Keppra  500mg  BID.  Savyon Loken, MD [facility] [phone]   If 7pm to 7am, please call on call as listed on AMION.

## 2023-12-10 NOTE — ED Notes (Signed)
Report called to [facility] °

## 2023-12-10 NOTE — H&P (Signed)
 NAME:  Philip [name], MRN:  [id number], DOB:  1955-10-06, LOS: 0 ADMISSION DATE:  12/10/2023, CONSULTATION DATE:  7/17  REFERRING MD:  [facility], CHIEF COMPLAINT:  AMS   History of Present Illness:  68yo male with hx difficult to control HTN, DM presented to [facility] ER 7/17 with acute onset L sided weakness. He called 911 and when EMS was placing him on the stretcher he had witnessed seizure with decorticate posturing for several minutes. This resolved with 5mg  midazolam . He was obtunded on arrival to ER and intubated. Hypertensive with BP 250/130, improved with labetalol , propofol . He was given keppra  and EDP d/w [facility] neuro who would like [facility] at [facility] for cEEG. PCCM asked to accept on tx.   Notable ER findings POS cocaine Hgb 17.9 Plt 120     Pertinent  Medical History   has a past medical history of Chronic cholecystitis with calculus (02/04/2013), CVA (cerebral vascular accident) (09/16/2012), Diabetes mellitus without complication (HCC), Erectile dysfunction, Gallstones (01/27/2013), GERD (gastroesophageal reflux disease), H. pylori infection (09/28/2017), Headache(784.0), Hepatitis C, History of kidney stones, History of migraine, Hypertension, Hypertension, Nausea, Seizures (HCC), Short-term memory loss, Sleep apnea, Stroke (HCC), Tobacco user, and Umbilical hernia (02/04/2013).   Significant Hospital Events: Including procedures, antibiotic start and stop dates in addition to other pertinent events   7/17 ADMITTED. Hypertensive crisis, seizure, CT/ CTA head and neck 7/17 1. Lacunar infarcts within the bilateral thalami which are new from the prior head CT of 03/10/2021, but otherwise age-indeterminate. 2. Background parenchymal atrophy and chronic small vessel ischemic disease.3. Nonspecific 3.4 x 1.8 cm ovoid cutaneous/subcutaneous lesion within the suboccipital scalp just to the right of midline. Direct examination recommended. CTA neck:  1. The common carotid and internal carotid arteries are  patent within the neck without measurable stenosis. Mild atherosclerotic plaque bilaterally, as described. 2. 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 3. Vertebral arteries patent within the neck without stenosis or significant atherosclerotic disease.  CTA head: 1. 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 2. Severe stenosis within the right posterior cerebral artery P2 segment, new from the prior MRA.  Interim History / Subjective:  Arrived heavily sedated on vent   Objective    Blood pressure (!) 144/97, pulse 64, temperature (!) 96.9 F (36.1 C), resp. rate 20, height 5' 7.01 (1.702 m), weight 73.7 kg, SpO2 98%.    Vent Mode: PRVC FiO2 (%):  [50 %] 50 % Set Rate:  [20 bmp] 20 bmp Vt Set:  [530 mL] 530 mL PEEP:  [5 cmH20] 5 cmH20  No intake or output data in the 24 hours ending 12/10/23 1043 Filed Weights   12/10/23 0829  Weight: 73.7 kg    Examination: General: chronically ill appearing male. Sedated on vent  HENT: pupils pinpoint orally intubated. Large palp abscess. ~ golf ball size. Draining. Sample sent Lungs: scattered rhonchi pcxr ett good position Cardiovascular: rrr we/out MRG Abdomen: soft not tenderf Extremities: warm brisk CXR Neuro: sedated. + cough. Decorticates w/ cough (primarily w/ lowers) no response uppers.  GU: cloudy yellow   Resolved problem list   Assessment and Plan   Acute metabolic encephalopathy w/ L sided weakness complicated by witnessed Seizure and age undetermined stroke -suspect mixed picture post-ictal and hypertensive encephalopathy  -keppra  loaded plan Neuro to see  Keppra   Propofol  and fent  RASS goal -2 unless EEG supports on-going seizure cEEG  Avoid temp, keep euglycemic, BP goals as below, HOB elevated MRI  PRN versed  (for breakthru  seizure)  HTN crisis difficult to control HTN at baseline c/b cocaine abuse  Arrived ~8am, initial SBP 250s plan PRN labetalol   Goal sbp 150-180 (now 6 hrs out Ideally want to avoid over shooting) Echo  Tele Careful to use non-selective beta-blocker given cocaine use Needs to stop using cocaine   Acute hypoxic respiratory failure - in setting seizure, AMS  W/ post intubation iatrogenic resp alk  Plan  Vent support - 8cc/kg  Adjust Ve F/u CXR  F/u ABG Daily WUA and SBT when ok with neuro   CKD - baseline Scr ~1.2-1.3  plan F/u chem BP goals as above Renal dose meds  Mild anion gap metabolic acidosis (on presentation) Plan Repeat lactate   Dm  Plan Ssi  Goal 140-180   Soft tissue abscess posterior scalp Plan IV vanc F/u cultures   Best Practice (right click and Reselect all SmartList Selections daily)   Diet/type: NPO DVT prophylaxis prophylactic heparin   Pressure ulcer(s): N/A GI prophylaxis: N/AH2B Lines: N/A Foley:  Yes, and it is still needed Code Status:  full code Last date of multidisciplinary goals of care discussion [na]  Labs   CBC: Recent Labs  Lab 12/10/23 0810  WBC 8.6  NEUTROABS 5.1  HGB 17.9*  HCT 54.4*  MCV 97.7  PLT 120*    Basic Metabolic Panel: Recent Labs  Lab 12/10/23 0810  NA 137  K 4.1  CL 98  CO2 19*  GLUCOSE 168*  BUN 6*  CREATININE 1.28*  CALCIUM  8.8*   GFR: Estimated Creatinine Clearance: 51.6 mL/min (A) (by C-G formula based on SCr of 1.28 mg/dL (H)). Recent Labs  Lab 12/10/23 0810  WBC 8.6    Liver Function Tests: Recent Labs  Lab 12/10/23 0810  AST 40  ALT 15  ALKPHOS 126  BILITOT 0.9  PROT 8.1  ALBUMIN 3.3*   No results for input(s): LIPASE, AMYLASE in the last 168 hours. No results for input(s): AMMONIA in the last 168 hours.  ABG    Component Value Date/Time   PHART 7.49 (H) 12/10/2023 0910   PCO2ART 34 12/10/2023 0910   PO2ART 74 (L) 12/10/2023 0910   HCO3 26.1 12/10/2023  0910   TCO2 27 03/10/2021 1431   O2SAT 97.3 12/10/2023 0910     Coagulation Profile: Recent Labs  Lab 12/10/23 0810  INR 1.0    Cardiac Enzymes: No results for input(s): CKTOTAL, CKMB, CKMBINDEX, TROPONINI in the last 168 hours.  HbA1C: HB A1C (BAYER DCA - WAIVED)  Date/Time Value Ref Range Status  11/17/2022 02:37 PM 5.1 4.8 - 5.6 % Final    Comment:             Prediabetes: 5.7 - 6.4          Diabetes: >6.4          Glycemic control for adults with diabetes: <7.0   10/24/2021 10:00 AM 5.7 (H) 4.8 - 5.6 % Final    Comment:             Prediabetes: 5.7 - 6.4          Diabetes: >6.4          Glycemic control for adults with diabetes: <7.0     CBG: Recent Labs  Lab 12/10/23 0800  GLUCAP 201*    Review of Systems:   Pt  unable, hx and ROS as above obtained from records and staff   Past Medical History:  He,  has a past medical history of Chronic cholecystitis with calculus (02/04/2013), CVA (cerebral vascular accident) (09/16/2012), Diabetes mellitus without complication (HCC), Erectile dysfunction, Gallstones (01/27/2013), GERD (gastroesophageal reflux disease), H. pylori infection (09/28/2017), Headache(784.0), Hepatitis C, History of kidney stones, History of migraine, Hypertension, Hypertension, Nausea, Seizures (HCC), Short-term memory loss, Sleep apnea, Stroke (HCC), Tobacco user, and Umbilical hernia (02/04/2013).   Surgical History:   Past Surgical History:  Procedure Laterality Date   BIOPSY  03/10/2017   Procedure: BIOPSY;  Surgeon: Harvey Margo CROME, MD;  Location[facility];  Service: Endoscopy;;  gastric biopsy    CHOLECYSTECTOMY N/A 03/09/2013   Procedure: LAPAROSCOPIC CHOLECYSTECTOMY WITH INTRAOPERATIVE CHOLANGIOGRAM;  Surgeon: Donnice POUR. Belinda, MD;  Location: [facility] OR;  Service: General;  Laterality: N/A;   COLONOSCOPY  06/2014   Dr. Obie: Three sessile polyps were found in the rectum x2 and descending. tubular adenomas. colonoscopy in 06/2019    ESOPHAGOGASTRODUODENOSCOPY (EGD) WITH PROPOFOL  N/A 08/21/2015   Dr. Harvey: normal esophagus, non-bleeding gastric ulcers/gastritis (H.pylori)    ESOPHAGOGASTRODUODENOSCOPY (EGD) WITH PROPOFOL  N/A 03/10/2017   Procedure: ESOPHAGOGASTRODUODENOSCOPY (EGD) WITH PROPOFOL ;  Surgeon: Harvey Margo CROME, MD;  Location: [facility];  Service: Endoscopy;  Laterality: N/A;  1:00pm-rescheduled to 10\16 @ 8:15am    UMBILICAL HERNIA REPAIR N/A 03/09/2013   Procedure: HERNIA REPAIR UMBILICAL ADULT;  Surgeon: Donnice POUR. Belinda, MD;  Location: [facility] OR;  Service: General;  Laterality: N/A;     Social History:   reports that he has been smoking cigarettes. He started smoking about 45 years ago. He has a 9.1 pack-year smoking history. He has never used smokeless tobacco. He reports current alcohol use of about 7.0 standard drinks of alcohol per week. He reports that he does not use drugs.   Family History:  His family history includes Cancer in his cousin; Colon cancer in his maternal uncle; Heart disease in his maternal grandfather, maternal grandmother, paternal grandfather, and paternal grandmother; Hypertension in his mother. There is no history of Rectal cancer or Stomach cancer.   Allergies No Known Allergies   Home Medications  Prior to Admission medications   Medication Sig Start Date End Date Taking? Authorizing Provider  amLODipine  (NORVASC ) 10 MG tablet Take 1 tablet (10 mg total) by mouth daily. 09/25/23 12/07/23  Joesph Annabella HERO, FNP  atorvastatin  (LIPITOR) 40 MG tablet Take 1 tablet (40 mg total) by mouth daily. for cholesterol. 11/17/22   Zollie Lowers, MD  gabapentin  (NEURONTIN ) 300 MG capsule Take 2 capsules (600 mg total) by mouth at bedtime. 11/17/22   Zollie Lowers, MD  hydrALAZINE  (APRESOLINE ) 50 MG tablet Take 1 tablet (50 mg total) by mouth 3 (three) times daily. 12/29/22   Zollie Lowers, MD  metoprolol  succinate (TOPROL -XL) 50 MG 24 hr tablet TAKE 1 TABLET BY MOUTH ONCE DAILY WITH OR  IMMEDIATELY FOLLOWING A MEAL. A 11/17/22   Zollie Lowers, MD  Multiple Vitamin (MULTIVITAMIN WITH MINERALS) TABS tablet Take 1 tablet by mouth daily. 03/13/21   Ezenduka, Nkeiruka J, MD  pantoprazole  (PROTONIX ) 40 MG tablet Take 1 tablet (40 mg total) by mouth daily. 11/17/22   Zollie Lowers, MD  sildenafil  (REVATIO ) 20 MG tablet TAKE 2 TO 5 TABLETS BY MOUTH ONCE DAILY AS NEEDED FOR ERECTILE DYSFUNCTION 11/25/22   Zollie Lowers, MD  topiramate  (TOPAMAX ) 50 MG tablet Take 1 tablet by mouth twice daily 06/10/21   Zollie Lowers, MD  Critical care time: 34 min    Maude FORBES Banner ACNP-BC [facility] Pulmonary/Critical Care Pager # [phone] OR # [phone] if no answer

## 2023-12-10 NOTE — Consult Note (Incomplete)
 NEUROLOGY CONSULT NOTE   Date of service: December 10, 2023 Patient Name: Philip [name] MRN:  [id number] DOB:  20-Feb-1956 Chief Complaint: Seizure Requesting Provider: Gretta Leita SQUIBB, DO  History of Present Illness  Philip [name] is a 68 y.o. male with hx of HTN, Dm, CVA, HTN, Seizures, and GERD who initially presented to [facility] via EMS who were initially called out for left side weakness and slurred speech, however per EMS when they moved him to their stretcher he began having tonic clonic seizure like activity with decorticate posturing and because unresponsive. Received 5mg  midazolam  by EMS with cessation of seizure activity.  On arrival to [facility] ED he was intubated and then loaded with keppra  4g prior to transfer to [facility].  Highest BP 227/138 with an episode of hypotension SBP 80s with transport. On arrival to [facility] PCCM reported decorticate movement in lower extremities with cough. Past medical notes report history of stroke in 2014 and one seizure in 2011 with no documented reoccurrence. It does appear he was on Dilantin  until 2014. He is now on topamax  for chronic headaches and gabapentin  for neuropathy.   Last seen in his normal state of health 7/16 at 2300.    ROS   Unable to ascertain due to patient is intubated and sedated  Past History   Past Medical History:  Diagnosis Date  . Chronic cholecystitis with calculus 02/04/2013  . CVA (cerebral vascular accident) 09/16/2012  . Diabetes mellitus without complication (HCC)    borderline  . Erectile dysfunction   . Gallstones 01/27/2013  . GERD (gastroesophageal reflux disease)   . H. pylori infection 09/28/2017  . Headache(784.0)    Headache, I dont think they are migranes  . Hepatitis C    Harvoni , completed 06/2015 (Dr. Efrain) , AUG 2017 SVR  . History of kidney stones    history of kidney stones  . History of migraine    takes Topamax  daily  . Hypertension    takes Hydralazine ,Maxzide ,Clonidine ,and Amlodipine  daily  .  Hypertension    also takes Metoprolol  daily  . Nausea    takes Zofran  daily  . Seizures (HCC)    off Dilantin  daily.  last one was when he had a stroke.  SABRA Short-term memory loss    due to stroke   . Sleep apnea    CPAP- tries to do it everyday.  . Stroke (HCC)    .  Short term memory loss.; left sided weakness  . Tobacco user   . Umbilical hernia 02/04/2013    Past Surgical History:  Procedure Laterality Date  . BIOPSY  03/10/2017   Procedure: BIOPSY;  Surgeon: Harvey Margo CROME, MD;  [facility];  Service: Endoscopy;;  gastric biopsy   . CHOLECYSTECTOMY N/A 03/09/2013   Procedure: LAPAROSCOPIC CHOLECYSTECTOMY WITH INTRAOPERATIVE CHOLANGIOGRAM;  Surgeon: Donnice POUR. Belinda, MD;  Location: [facility] OR;  Service: General;  Laterality: N/A;  . COLONOSCOPY  06/2014   Dr. Obie: Three sessile polyps were found in the rectum x2 and descending. tubular adenomas. colonoscopy in 06/2019  . ESOPHAGOGASTRODUODENOSCOPY (EGD) WITH PROPOFOL  N/A 08/21/2015   Dr. Harvey: normal esophagus, non-bleeding gastric ulcers/gastritis (H.pylori)   . ESOPHAGOGASTRODUODENOSCOPY (EGD) WITH PROPOFOL  N/A 03/10/2017   Procedure: ESOPHAGOGASTRODUODENOSCOPY (EGD) WITH PROPOFOL ;  Surgeon: Harvey Margo CROME, MD;  [facility];  Service: Endoscopy;  Laterality: N/A;  1:00pm-rescheduled to 10\16 @ 8:15am   . UMBILICAL HERNIA REPAIR N/A 03/09/2013   Procedure: HERNIA REPAIR UMBILICAL ADULT;  Surgeon:  Donnice POUR. Belinda, MD;  Location: [facility] OR;  Service: General;  Laterality: N/A;    Family History: Family History  Problem Relation Age of Onset  . Heart disease Maternal Grandmother   . Heart disease Maternal Grandfather   . Heart disease Paternal Grandmother   . Heart disease Paternal Grandfather   . Cancer Cousin   . Hypertension Mother   . Colon cancer Maternal Uncle   . Rectal cancer Neg Hx   . Stomach cancer Neg Hx     Social History  reports that he has been smoking cigarettes. He started smoking  about 45 years ago. He has a 9.1 pack-year smoking history. He has never used smokeless tobacco. He reports current alcohol use of about 7.0 standard drinks of alcohol per week. He reports that he does not use drugs.  No Known Allergies  Medications   Current Facility-Administered Medications:  .  0.9 %  sodium chloride  infusion, 250 mL, Intravenous, Continuous, Whiteheart, Kathryn A, NP .  Chlorhexidine  Gluconate Cloth 2 % PADS 6 each, 6 each, Topical, Daily, Gretta Leita SQUIBB, DO .  docusate (COLACE) 50 MG/5ML liquid 100 mg, 100 mg, Per Tube, BID, Gretta, Laura P, DO .  famotidine  (PEPCID ) tablet 20 mg, 20 mg, Per Tube, BID, Whiteheart, Kathryn A, NP .  fentaNYL  in NS 250mL (14mcg/ml) infusion-PREMIX, 0-400 mcg/hr, Intravenous, Continuous, Cleotilde Rogue, MD, Last Rate: 5 mL/hr at 12/10/23 1306, 50 mcg/hr at 12/10/23 1306 .  heparin  injection 5,000 Units, 5,000 Units, Subcutaneous, Q8H, Whiteheart, Kathryn A, NP .  insulin  aspart (novoLOG ) injection 0-15 Units, 0-15 Units, Subcutaneous, Q4H, Whiteheart, Kathryn A, NP .  labetalol  (NORMODYNE ) injection 10 mg, 10 mg, Intravenous, Q2H PRN, Whiteheart, Kathryn A, NP .  LORazepam  (ATIVAN ) injection 2 mg, 2 mg, Intravenous, Q4H PRN, Whiteheart, Kathryn A, NP .  norepinephrine  (LEVOPHED ) 4mg  in (0.016 mg/mL) premix infusion, 0-10 mcg/min, Intravenous, Titrated, Whiteheart, Lamarr LABOR, NP .  Oral care mouth rinse, 15 mL, Mouth Rinse, Q2H, Clark, Laura P, DO .  Oral care mouth rinse, 15 mL, Mouth Rinse, PRN, Gretta Leita P, DO .  polyethylene glycol (MIRALAX  / GLYCOLAX ) packet 17 g, 17 g, Per Tube, Daily PRN, Whiteheart, Kathryn A, NP .  propofol  (DIPRIVAN ) 1000 MG/100ML infusion, 0-80 mcg/kg/min, Intravenous, Continuous, Cleotilde Rogue, MD, Last Rate: 18.41 mL/hr at 12/10/23 1306, 40 mcg/kg/min at 12/10/23 1306 .  [START ON 12/11/2023] vancomycin  (VANCOREADY) IVPB 1250 mg/250 mL, 1,250 mg, Intravenous, Q24H, Clark, Laura P, DO .  vancomycin   (VANCOREADY) IVPB 1500 mg/300 mL, 1,500 mg, Intravenous, Once, Gretta Leita SQUIBB, DO  Vitals   Vitals:   12/10/23 1300 12/10/23 1305 12/10/23 1310 12/10/23 1315  BP: 96/73 (!) 88/72 93/72 105/81  Pulse:      Resp: 20 17 19 19   Temp: 97.7 F (36.5 C) 97.7 F (36.5 C) 97.7 F (36.5 C) 97.8 F (36.6 C)  SpO2:      Weight:      Height:        Body mass index is 25.45 kg/m.   Physical Exam   Constitutional: Appears well-developed and well-nourished.  Psych: Sedated Eyes: Disconjugate gaze HENT: ETT in place Head: Normocephalic.  Cardiovascular: Normal rate and regular rhythm.  Respiratory: Mechanically ventilated GI: Soft.  No distension.  Skin: WDI.   Neurologic Examination   Neuro: Mental Status: Patient is intubated, sedated.  Not following commands.  No verbal output Cranial Nerves: II: Pupils are equal 2mm and sluggish, does not blink to threat. Corneal  intact.  III,IV, VI: Vertical disconjugate gaze, improved on second exam VII: Facial movement is obstructed by ETT  VIII: Hearing is intact to voice X: Cough intact XI: Head is midline XII: Obscured by ETT Motor/Sensory: Tone is normal. Bulk is normal.  No spontaneous or purposeful movement with noxious stimuli Patient is currently sedated with propofol    Labs/Imaging/Neurodiagnostic studies   CBC:  Recent Labs  Lab 2023-12-23 0810  WBC 8.6  NEUTROABS 5.1  HGB 17.9*  HCT 54.4*  MCV 97.7  PLT 120*   Basic Metabolic Panel:  Lab Results  Component Value Date   NA 137 23-Dec-2023   K 4.1 2023-12-23   CO2 19 (L) 2023-12-23   GLUCOSE 168 (H) 2023-12-23   BUN 6 (L) Dec 23, 2023   CREATININE 1.28 (H) 2023/12/23   CALCIUM  8.8 (L) 2023/12/23   GFRNONAA >60 Dec 23, 2023   GFRAA 58 (L) 01/09/2020   Lipid Panel:  Lab Results  Component Value Date   LDLCALC 57 11/17/2022   HgbA1c:  Lab Results  Component Value Date   HGBA1C 5.1 11/17/2022   Urine Drug Screen:     Component Value Date/Time   LABOPIA  NONE DETECTED 2023/12/23 0822   COCAINSCRNUR POSITIVE (A) Dec 23, 2023 0822   LABBENZ POSITIVE (A) 23-Dec-2023 0822   AMPHETMU NONE DETECTED 2023-12-23 0822   THCU NONE DETECTED 2023/12/23 0822   LABBARB NONE DETECTED 2023/12/23 0822    Alcohol Level     Component Value Date/Time   ETH <15 23-Dec-2023 0810   INR  Lab Results  Component Value Date   INR 1.0 23-Dec-2023   APTT  Lab Results  Component Value Date   APTT 22 (L) December 23, 2023   AED levels:  Lab Results  Component Value Date   PHENYTOIN  3.0 (L) 01/31/2013    CT Head without contrast(Personally reviewed): Lacunar infarcts within the bilateral thalami which are new from the prior head CT of 03/10/2021, but otherwise age-indeterminate.  CT angio Head and Neck with contrast(Personally reviewed): 1. 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 2. Severe stenosis within the right posterior cerebral artery P2 segment, new from the prior MRA.  Neurodiagnostics cEEG:  Pending   ASSESSMENT   Philip [name] is a 68 y.o. male with hx of HTN, Dm, CVA, HTN, Seizures, and GERD who initially presented to [facility] ED via EMS who were initially called out for left side weakness and slurred speech, however per EMS when they moved him to their stretcher he began having tonic clonic seizure like activity with decorticate posturing. Received 5mg  midazolam  by EMS with cessation of seizure activity. Intubated at [facility] ED and transferred to [facility]. He was loaded with keppra  4g prior to transfer to [facility].  Highest BP 227/138 with an episode of hypotension SBP 80s with transport.   RECOMMENDATIONS  - cEEG - MRI brain w wo  - Continue keppra   - Keppra  4000mg  load, then 500 mg BID. Adjust as needed for renal function:  Estimated Creatinine Clearance: 47.2 mL/min (A) (by C-G formula based on SCr of 1.4 mg/dL (H)).   CrCl 30 to <50 mL/minute/1.73 m2: 250 to 750 mg every 12  hours.  ______________________________________________________________________    Bonney Jorene Last, NP [facility]

## 2023-12-10 NOTE — Progress Notes (Signed)
   12/10/23 1210  TOC Brief Assessment  Insurance and Status Reviewed  Patient has primary care physician Yes  Home environment has been reviewed Apartment  Prior level of function: independent  Prior/Current Home Services No current home services  Social Drivers of Health Review SDOH reviewed no interventions necessary  Readmission risk has been reviewed Yes  Transition of care needs no transition of care needs at this time   Transition of Care Department [facility]) has reviewed patient and no TOC needs have been identified at this time. We will continue to monitor patient advancement through interdisciplinary progression rounds. If new patient transition needs arise, please place a TOC consult.

## 2023-12-10 NOTE — ED Notes (Signed)
 Per Dr. Cleotilde, goal SBP <180.

## 2023-12-10 NOTE — ED Notes (Signed)
 Dr. Cleotilde notified of lack of pupillary response.

## 2023-12-11 ENCOUNTER — Inpatient Hospital Stay (HOSPITAL_COMMUNITY)

## 2023-12-11 ENCOUNTER — Encounter (HOSPITAL_COMMUNITY): Payer: Self-pay | Admitting: Critical Care Medicine

## 2023-12-11 DIAGNOSIS — I161 Hypertensive emergency: Secondary | ICD-10-CM | POA: Diagnosis not present

## 2023-12-11 DIAGNOSIS — F1410 Cocaine abuse, uncomplicated: Secondary | ICD-10-CM

## 2023-12-11 DIAGNOSIS — G9341 Metabolic encephalopathy: Secondary | ICD-10-CM | POA: Diagnosis not present

## 2023-12-11 DIAGNOSIS — J9601 Acute respiratory failure with hypoxia: Secondary | ICD-10-CM | POA: Diagnosis not present

## 2023-12-11 DIAGNOSIS — F1490 Cocaine use, unspecified, uncomplicated: Secondary | ICD-10-CM | POA: Diagnosis not present

## 2023-12-11 DIAGNOSIS — I10 Essential (primary) hypertension: Secondary | ICD-10-CM

## 2023-12-11 DIAGNOSIS — I169 Hypertensive crisis, unspecified: Secondary | ICD-10-CM | POA: Diagnosis not present

## 2023-12-11 DIAGNOSIS — R569 Unspecified convulsions: Secondary | ICD-10-CM | POA: Diagnosis not present

## 2023-12-11 LAB — GLUCOSE, CAPILLARY
Glucose-Capillary: 108 mg/dL — ABNORMAL HIGH (ref 70–99)
Glucose-Capillary: 119 mg/dL — ABNORMAL HIGH (ref 70–99)
Glucose-Capillary: 132 mg/dL — ABNORMAL HIGH (ref 70–99)
Glucose-Capillary: 139 mg/dL — ABNORMAL HIGH (ref 70–99)
Glucose-Capillary: 140 mg/dL — ABNORMAL HIGH (ref 70–99)
Glucose-Capillary: 167 mg/dL — ABNORMAL HIGH (ref 70–99)

## 2023-12-11 LAB — ECHOCARDIOGRAM COMPLETE
Area-P 1/2: 3.77 cm2
Height: 67.008 in
S' Lateral: 2.4 cm
Weight: 2497.37 [oz_av]

## 2023-12-11 LAB — CBC
HCT: 50.1 % (ref 39.0–52.0)
Hemoglobin: 17.1 g/dL — ABNORMAL HIGH (ref 13.0–17.0)
MCH: 32.1 pg (ref 26.0–34.0)
MCHC: 34.1 g/dL (ref 30.0–36.0)
MCV: 94.2 fL (ref 80.0–100.0)
Platelets: 161 K/uL (ref 150–400)
RBC: 5.32 MIL/uL (ref 4.22–5.81)
RDW: 13.8 % (ref 11.5–15.5)
WBC: 12.6 K/uL — ABNORMAL HIGH (ref 4.0–10.5)
nRBC: 0 % (ref 0.0–0.2)

## 2023-12-11 LAB — BASIC METABOLIC PANEL WITH GFR
Anion gap: 13 (ref 5–15)
BUN: 9 mg/dL (ref 8–23)
CO2: 24 mmol/L (ref 22–32)
Calcium: 8.8 mg/dL — ABNORMAL LOW (ref 8.9–10.3)
Chloride: 100 mmol/L (ref 98–111)
Creatinine, Ser: 1.52 mg/dL — ABNORMAL HIGH (ref 0.61–1.24)
GFR, Estimated: 50 mL/min — ABNORMAL LOW (ref >60)
Glucose, Bld: 156 mg/dL — ABNORMAL HIGH (ref 70–99)
Potassium: 3.8 mmol/L (ref 3.5–5.1)
Sodium: 137 mmol/L (ref 135–145)

## 2023-12-11 LAB — TRIGLYCERIDES: Triglycerides: 154 mg/dL — ABNORMAL HIGH (ref <150)

## 2023-12-11 LAB — PHOSPHORUS: Phosphorus: 3.2 mg/dL (ref 2.5–4.6)

## 2023-12-11 LAB — MAGNESIUM: Magnesium: 2.1 mg/dL (ref 1.7–2.4)

## 2023-12-11 MED ORDER — ORAL CARE MOUTH RINSE: 15.0000 mL | OROMUCOSAL | Status: DC | PRN

## 2023-12-11 MED ORDER — ORAL CARE MOUTH RINSE: 15.0000 mL | OROMUCOSAL | Status: DC

## 2023-12-11 MED ORDER — GABAPENTIN 250 MG/5ML PO SOLN
300.0000 mg | Freq: Every day | ORAL | Status: DC
  Filled 2023-12-11: qty 6

## 2023-12-11 MED ORDER — DEXMEDETOMIDINE HCL IN NACL 400 MCG/100ML IV SOLN
0.0000 ug/kg/h | INTRAVENOUS | Status: DC
  Administered 2023-12-11: 0.6 ug/kg/h via INTRAVENOUS
  Administered 2023-12-11: 0.4 ug/kg/h via INTRAVENOUS
  Filled 2023-12-11 (×2): qty 100

## 2023-12-11 MED ORDER — LIDOCAINE-EPINEPHRINE 2 %-1:100000 IJ SOLN
20.0000 mL | Freq: Once | INTRAMUSCULAR | Status: AC
  Administered 2023-12-11: 20 mL via INTRADERMAL
  Filled 2023-12-11: qty 20

## 2023-12-11 MED ORDER — ASPIRIN 81 MG PO CHEW
81.0000 mg | CHEWABLE_TABLET | Freq: Every day | ORAL | Status: DC
  Administered 2023-12-12 – 2023-12-17 (×6): 81 mg via ORAL
  Filled 2023-12-11 (×6): qty 1

## 2023-12-11 MED ORDER — LEVETIRACETAM (KEPPRA) 500 MG/5 ML ADULT IV PUSH
500.0000 mg | Freq: Two times a day (BID) | INTRAVENOUS | Status: DC
  Administered 2023-12-11 – 2023-12-16 (×11): 500 mg via INTRAVENOUS
  Filled 2023-12-11 (×11): qty 5

## 2023-12-11 MED ORDER — LACTATED RINGERS IV SOLN: INTRAVENOUS | Status: AC

## 2023-12-11 MED ORDER — TOPIRAMATE 25 MG PO TABS
50.0000 mg | ORAL_TABLET | Freq: Two times a day (BID) | ORAL | Status: DC
  Administered 2023-12-11: 50 mg
  Filled 2023-12-11: qty 2

## 2023-12-11 MED ORDER — GADOBUTROL 1 MMOL/ML IV SOLN
7.5000 mL | Freq: Once | INTRAVENOUS | Status: AC | PRN
  Administered 2023-12-11: 7.5 mL via INTRAVENOUS

## 2023-12-11 NOTE — Progress Notes (Signed)
 eLink Physician-Brief Progress Note Patient Name: KYMIR COLES DOB: 12-Jan-1956 MRN: [id number]   Date of Service  12/11/2023  HPI/Events of Note  Agitated and reaching for endotracheal tube  Minimal urine output over the last few hours  eICU Interventions  Bilateral wrist restraints as needed  Net positive 429 cc, no intervention for now     Intervention Category Minor Interventions: Agitation / anxiety - evaluation and management  Tinya Cadogan 12/11/2023, 1:11 AM

## 2023-12-11 NOTE — Procedures (Addendum)
 Patient Name: Philip [name]  MRN: [id number]  Epilepsy Attending: Arlin MALVA Krebs  Referring Physician/Provider: Remi Pippin, NP  Duration: 12/10/2023 1534 to 12/11/2023 1723  Patient history: 68 y.o. male with hx of HTN, Dm, CVA, HTN, Seizures, and GERD who initially presented to [facility] ED via EMS who were initially called out for left side weakness and slurred speech, however per EMS when they moved him to their stretcher he began having tonic clonic seizure like activity with decorticate posturing. EEG to evaluate for seizure  Level of alertness: Awake, asleep  AEDs during EEG study: LEV, Versed , Propofol   Technical aspects: This EEG study was done with scalp electrodes positioned according to the 10-20 International system of electrode placement. Electrical activity was reviewed with band pass filter of 1-70Hz , sensitivity of 7 uV/mm, display speed of 92mm/sec with a 60Hz  notched filter applied as appropriate. EEG data were recorded continuously and digitally stored.  Video monitoring was available and reviewed as appropriate.  Description: EEG initially showed continuous generalized 3 to 6 Hz theta-delta slowing with overriding 13 to 15 Hz beta activity. Gradually as sedation was weaned, EEG improved and showed The posterior dominant rhythm consists of 9-10 Hz activity of moderate voltage (25-35 uV) seen predominantly in posterior head regions, symmetric and reactive to eye opening and eye closing. Sleep was characterized by vertex waves, sleep spindles (12 to 14 Hz), maximal frontocentral region. Intermittent generalized 3-6hz  theta-delta slowing was noted. Hyperventilation and photic stimulation were not performed.     EEG was disconnected on 12/11/2023 between 0812 to 0942 for mri brain. After 1723, EEG ws not interpretable due to significant electrode artifact.  ABNORMALITY - Continuous slow, generalized  IMPRESSION: This study was initially suggestive of moderate to severe diffuse  encephalopathy, likely related to sedation. Gradually as sedation was weaned, EEG improved and was suggestive of mild to moderate diffuse encephalopathy. No seizures or epileptiform discharges were seen throughout the recording.  Chiquita Heckert O Kal Chait

## 2023-12-11 NOTE — Progress Notes (Signed)
 Subjective:   More awake this morning.  No further seizures overnight.  Sister at bedside.  States he is very stubborn and likely not taking any of his medications.  States he has few other sisters and maybe 1 daughter who is in [place]  but not reachable right now.  ROS: Unable to obtain due to intubation  Examination  Vital signs in last 24 hours: Temp:  [96.9 F (36.1 C)-99.6 F (37.6 C)] 97.9 F (36.6 C) (07/18 0800) Pulse Rate:  [47-67] 47 (07/18 0845) Resp:  [12-21] 16 (07/18 0815) BP: (88-191)/(72-145) 161/81 (07/18 0845) SpO2:  [98 %-100 %] 99 % (07/18 0845) FiO2 (%):  [40 %-60 %] 40 % (07/18 0724) Weight:  [70.8 kg] 70.8 kg (07/18 0440)  General: lying in bed, intubated Neuro: Opening eyes spontaneously, following commands, PERRLA, tracking examiner in room, antigravity strength in all 4 extremities  Basic Metabolic Panel: Recent Labs  Lab 12/10/23 0810 12/10/23 1510 12/11/23 0423  NA 137  --  137  K 4.1  --  3.8  CL 98  --  100  CO2 19*  --  24  GLUCOSE 168*  --  156*  BUN 6*  --  9  CREATININE 1.28* 1.40* 1.52*  CALCIUM  8.8*  --  8.8*  MG  --   --  2.1  PHOS  --   --  3.2    CBC: Recent Labs  Lab 12/10/23 0810 12/10/23 1510 12/11/23 0423  WBC 8.6 9.2 12.6*  NEUTROABS 5.1  --   --   HGB 17.9* 18.9* 17.1*  HCT 54.4* 55.3* 50.1  MCV 97.7 93.9 94.2  PLT 120* 129* 161     Coagulation Studies: Recent Labs    12/10/23 0810  LABPROT 14.2  INR 1.0    Imaging personally reviewed  MRI brain with and without contrast 12/11/2023:There are focal areas of hemosiderin staining present within the thalami bilaterally, with the lesion on the right having developed in the interim. 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 Peripherally enhancing fluid collection in the right posterior scalp, which could represent a seroma or abscess. Recommend clinical correlation.  ASSESSMENT AND PLAN: KAVIN WECKWERTH is a 68 y.o. male with hx of HTN, Dm, CVA, HTN, Seizures, and GERD who initially presented to [facility] via EMS who were initially called out for left side weakness and slurred speech, however per EMS when they moved him to their stretcher he began having tonic clonic seizure like activity with decorticate posturing. Received 5mg  midazolam  by EMS with cessation of seizure activity. Intubated at [facility] ED and transferred to [facility]. He was loaded with keppra  4g prior to transfer to [facility].  Highest BP 227/138 with an episode of hypotension SBP 80s with transport.    Hypertensive emergency, improving Seizures, likely provoked Cocaine use disorder -Patient likely had provoked seizures in the setting of cocaine use, hypertensive emergency.  However did have left-sided weakness and slurred speech and therefore because of the focal nature as well as prior history of seizures, would recommend patient stay on antiseizure medications  Recommendations -Okay to reduce propofol  to 10 mg/kg/h and around noon okay to stop propofol  completely if seizures do not recur -Patient is already on gabapentin  600 mg nightly.  Unclear if he was taking the whole dose at home.  Therefore, for now we will resume 300 mg nightly.  Depending on how he tolerates this dose, can increase to home dose of 600 mg nightly  gradually - Resume Topamax  50 mg twice daily.  Of note both Topamax  and gabapentin  are also antiseizure medications - For now we will continue Keppra  500 mg twice daily however this can be eventually discontinued - Goal blood pressure: Gradual normotension - Cocaine cessation counseling once more awake -Discussed plan with sister at bedside. - Discussed plan with ICU team via secure chat    CRITICAL CARE Performed by: Arlin MALVA Krebs   Total critical care time: 35 minutes  Critical care time was exclusive of separately billable procedures and treating other patients.  Critical care was necessary to treat or prevent  imminent or life-threatening deterioration.  Critical care was time spent personally by me on the following activities: development of treatment plan with patient and/or surrogate as well as nursing, discussions with consultants, evaluation of patient's response to treatment, examination of patient, obtaining history from patient or surrogate, ordering and performing treatments and interventions, ordering and review of laboratory studies, ordering and review of radiographic studies, pulse oximetry and re-evaluation of patient's condition.     Arlin Krebs [facility] For questions after 5pm please refer to AMION to reach the Neurologist on call

## 2023-12-11 NOTE — Progress Notes (Signed)
 LTM maint complete - no skin breakdown under: FP1,F3

## 2023-12-11 NOTE — Plan of Care (Signed)

## 2023-12-11 NOTE — Consult Note (Signed)
 Philip [name] March 09, 1956  [id number].    Requesting MD: Dr. Leita Carlyon Gaskins Chief Complaint/Reason for Consult: scalp abscess  HPI:  This is a 68 yo male with a history of HTN, DM, CVA, GERD, Hep C, PSA, and seizures who presented to [facility] ED yesterday secondary to acute onset of L sided weakness via EMS.  He had a witnessed seizure and decorticate posturing.  He was intubated and transferred to [facility] for further critical care.  He underwent a head CT that revealed lacunar infarcts within the B thalami as well as a nonspecific 3.4x1.8cm ovoid subcutaneous lesion.  We have been asked to see him for this lesion for possible I&D.  The patient is on the ventilatory and unable to provide any history.  His history is obtained from the chart.  ROS: Review of Systems  Unable to perform ROS: Intubated  : unable, on the vent  Family History  Problem Relation Age of Onset   Heart disease Maternal Grandmother    Heart disease Maternal Grandfather    Heart disease Paternal Grandmother    Heart disease Paternal Grandfather    Cancer Cousin    Hypertension Mother    Colon cancer Maternal Uncle    Rectal cancer Neg Hx    Stomach cancer Neg Hx     Past Medical History:  Diagnosis Date   Chronic cholecystitis with calculus 02/04/2013   CVA (cerebral vascular accident) 09/16/2012   Diabetes mellitus without complication (HCC)    borderline   Erectile dysfunction    Gallstones 01/27/2013   GERD (gastroesophageal reflux disease)    H. pylori infection 09/28/2017   Headache(784.0)    Headache, I dont think they are migranes   Hepatitis C    Harvoni , completed 06/2015 (Dr. Efrain) , AUG 2017 SVR   History of kidney stones    history of kidney stones   History of migraine    takes Topamax  daily   Hypertension    takes Hydralazine ,Maxzide ,Clonidine ,and Amlodipine  daily   Hypertension    also takes Metoprolol  daily   Nausea    takes Zofran  daily   Seizures (HCC)    off Dilantin  daily.  last  one was when he had a stroke.   Short-term memory loss    due to stroke    Sleep apnea    CPAP- tries to do it everyday.   Stroke (HCC)    .  Short term memory loss.; left sided weakness   Tobacco user    Umbilical hernia 02/04/2013    Past Surgical History:  Procedure Laterality Date   BIOPSY  03/10/2017   Procedure: BIOPSY;  Surgeon: Harvey Margo CROME, MD;  Location: [facility];  Service: Endoscopy;;  gastric biopsy    CHOLECYSTECTOMY N/A 03/09/2013   Procedure: LAPAROSCOPIC CHOLECYSTECTOMY WITH INTRAOPERATIVE CHOLANGIOGRAM;  Surgeon: Donnice POUR. Belinda, MD;  Location: [facility] OR;  Service: General;  Laterality: N/A;   COLONOSCOPY  06/2014   Dr. Obie: Three sessile polyps were found in the rectum x2 and descending. tubular adenomas. colonoscopy in 06/2019   ESOPHAGOGASTRODUODENOSCOPY (EGD) WITH PROPOFOL  N/A 08/21/2015   Dr. Harvey: normal esophagus, non-bleeding gastric ulcers/gastritis (H.pylori)    ESOPHAGOGASTRODUODENOSCOPY (EGD) WITH PROPOFOL  N/A 03/10/2017   Procedure: ESOPHAGOGASTRODUODENOSCOPY (EGD) WITH PROPOFOL ;  Surgeon: Harvey Margo CROME, MD;  Location: [facility];  Service: Endoscopy;  Laterality: N/A;  1:00pm-rescheduled to 10\16 @ 8:15am    UMBILICAL HERNIA REPAIR N/A 03/09/2013   Procedure: HERNIA REPAIR UMBILICAL ADULT;  Surgeon:  Donnice POUR. Belinda, MD;  Location: [facility] OR;  Service: General;  Laterality: N/A;    Social History:  reports that he has been smoking cigarettes. He started smoking about 45 years ago. He has a 9.1 pack-year smoking history. He has never used smokeless tobacco. He reports current alcohol use of about 7.0 standard drinks of alcohol per week. He reports that he does not use drugs.  Allergies: No Known Allergies  Medications Prior to Admission  Medication Sig Dispense Refill   amLODipine  (NORVASC ) 10 MG tablet Take 1 tablet (10 mg total) by mouth daily. 30 tablet 0   atorvastatin  (LIPITOR) 40 MG tablet Take 1 tablet (40 mg total) by mouth daily. for  cholesterol. 90 tablet 3   gabapentin  (NEURONTIN ) 300 MG capsule Take 2 capsules (600 mg total) by mouth at bedtime. 180 capsule 3   hydrALAZINE  (APRESOLINE ) 50 MG tablet Take 1 tablet (50 mg total) by mouth 3 (three) times daily. 270 tablet 0   metoprolol  succinate (TOPROL -XL) 50 MG 24 hr tablet TAKE 1 TABLET BY MOUTH ONCE DAILY WITH OR IMMEDIATELY FOLLOWING A MEAL. A 30 tablet 1   Multiple Vitamin (MULTIVITAMIN WITH MINERALS) TABS tablet Take 1 tablet by mouth daily.     pantoprazole  (PROTONIX ) 40 MG tablet Take 1 tablet (40 mg total) by mouth daily. 90 tablet 3   sildenafil  (REVATIO ) 20 MG tablet TAKE 2 TO 5 TABLETS BY MOUTH ONCE DAILY AS NEEDED FOR ERECTILE DYSFUNCTION 50 tablet 2   topiramate  (TOPAMAX ) 50 MG tablet Take 1 tablet by mouth twice daily 180 tablet 0     Physical Exam: Blood pressure (!) 161/81, pulse (!) 47, temperature (!) 97.4 F (36.3 C), temperature source Axillary, resp. rate 16, height 5' 7.01 (1.702 m), weight 70.8 kg, SpO2 99%.  General: ill-appearing male who is laying in bed in NAD HEENT: head is normocephalic, but a lesion noted in the right posterior aspect of his scalp.  This is fluctuant. No cellulitis or warmth.  Sclera are anicteric.  PERRL.  Ears and nose without any masses or lesions.  Mouth is pink with ETT in place Heart: regular, rate, and rhythm.  Normal s1,s2. No obvious murmurs, gallops, or rubs noted.   Lungs: CTAB, no wheezes, rhonchi, or rales noted.  Ventilated respirations Abd: soft, NT, ND Psych: sedated on vent.  Unable to obtain.   Results for orders placed or performed during the hospital encounter of 12/10/23 (from the past 48 hours)  CBG monitoring, ED     Status: Abnormal   Collection Time: 12/10/23  8:00 AM  Result Value Ref Range   Glucose-Capillary 201 (H) 70 - 99 mg/dL    Comment: Glucose reference range applies only to samples taken after fasting for at least 8 hours.  Ethanol     Status: None   Collection Time: 12/10/23  8:10  AM  Result Value Ref Range   Alcohol, Ethyl (B) <15 <15 mg/dL    Comment: (NOTE) For medical purposes only. Performed at [facility], [address] [place], [place] [address]   Protime-INR     Status: None   Collection Time: 12/10/23  8:10 AM  Result Value Ref Range   Prothrombin Time 14.2 11.4 - 15.2 seconds   INR 1.0 0.8 - 1.2    Comment: (NOTE) INR goal varies based on device and disease states. Performed at [facility], [address], [place], [place] [address]   APTT     Status: Abnormal   Collection Time:  12/10/23  8:10 AM  Result Value Ref Range   aPTT 22 (L) 24 - 36 seconds    Comment: Performed at [facility], [address] [place], [place] [address]  CBC     Status: Abnormal   Collection Time: 12/10/23  8:10 AM  Result Value Ref Range   WBC 8.6 4.0 - 10.5 K/uL   RBC 5.57 4.22 - 5.81 MIL/uL   Hemoglobin 17.9 (H) 13.0 - 17.0 g/dL   HCT 45.5 (H) 60.9 - 47.9 %   MCV 97.7 80.0 - 100.0 fL   MCH 32.1 26.0 - 34.0 pg   MCHC 32.9 30.0 - 36.0 g/dL   RDW 86.4 88.4 - 84.4 %   Platelets 120 (L) 150 - 400 K/uL    Comment: REPEATED TO VERIFY   nRBC 0.0 0.0 - 0.2 %    Comment: Performed at [facility], [address] [place], [place] [address]  Differential     Status: Abnormal   Collection Time: 12/10/23  8:10 AM  Result Value Ref Range   Neutrophils Relative % 59 %   Neutro Abs 5.1 1.7 - 7.7 K/uL   Lymphocytes Relative 15 %   Lymphs Abs 1.3 0.7 - 4.0 K/uL   Monocytes Relative 10 %   Monocytes Absolute 0.9 0.1 - 1.0 K/uL   Eosinophils Relative 14 %   Eosinophils Absolute 1.2 (H) 0.0 - 0.5 K/uL   Basophils Relative 1 %   Basophils Absolute 0.1 0.0 - 0.1 K/uL   Immature Granulocytes 1 %   Abs Immature Granulocytes 0.04 0.00 - 0.07 K/uL    Comment: Performed at [facility], [address] [place], [place] [address]  Comprehensive metabolic panel     Status: Abnormal   Collection Time: 12/10/23  8:10 AM  Result Value Ref Range   Sodium 137 135 - 145 mmol/L    Potassium 4.1 3.5 - 5.1 mmol/L   [place] 98 98 - 111 mmol/L   CO2 19 (L) 22 - 32 mmol/L   Glucose, Bld 168 (H) 70 - 99 mg/dL    Comment: Glucose reference range applies only to samples taken after fasting for at least 8 hours.   BUN 6 (L) 8 - 23 mg/dL   Creatinine, Ser 8.71 (H) 0.61 - 1.24 mg/dL   Calcium  8.8 (L) 8.9 - 10.3 mg/dL   Total Protein 8.1 6.5 - 8.1 g/dL   Albumin 3.3 (L) 3.5 - 5.0 g/dL   AST 40 15 - 41 U/L   ALT 15 0 - 44 U/L   Alkaline Phosphatase 126 38 - 126 U/L   Total Bilirubin 0.9 0.0 - 1.2 mg/dL   GFR, Estimated >39 >39 mL/min    Comment: (NOTE) Calculated using the CKD-EPI Creatinine Equation (2021)    Anion gap 20 (H) 5 - 15    Comment: Performed at [facility], [address] [place], [place] [address]  Urine rapid drug screen (hosp performed)     Status: Abnormal   Collection Time: 12/10/23  8:22 AM  Result Value Ref Range   Opiates NONE DETECTED NONE DETECTED   Cocaine POSITIVE (A) NONE DETECTED   Benzodiazepines POSITIVE (A) NONE DETECTED   Amphetamines NONE DETECTED NONE DETECTED   Tetrahydrocannabinol NONE DETECTED NONE DETECTED   Barbiturates NONE DETECTED NONE DETECTED    Comment: (NOTE) DRUG SCREEN FOR MEDICAL PURPOSES ONLY.  IF CONFIRMATION IS NEEDED FOR ANY PURPOSE, NOTIFY LAB WITHIN 5 DAYS.  LOWEST DETECTABLE LIMITS FOR URINE DRUG SCREEN Drug Class  Cutoff (ng/mL) Amphetamine and metabolites    1000 Barbiturate and metabolites    200 Benzodiazepine                 200 Opiates and metabolites        300 Cocaine and metabolites        300 THC                            50 Performed at [facility], [address] [place], [place] [address]   Urinalysis, Routine w reflex microscopic -Urine, Catheterized     Status: Abnormal   Collection Time: 12/10/23  8:22 AM  Result Value Ref Range   Color, Urine YELLOW YELLOW   APPearance CLEAR CLEAR   Specific Gravity, Urine 1.010 1.005 - 1.030   pH 6.0 5.0 - 8.0   Glucose, UA  50 (A) NEGATIVE mg/dL   Hgb urine dipstick SMALL (A) NEGATIVE   Bilirubin Urine NEGATIVE NEGATIVE   Ketones, ur NEGATIVE NEGATIVE mg/dL   Protein, ur 899 (A) NEGATIVE mg/dL   Nitrite NEGATIVE NEGATIVE   Leukocytes,Ua NEGATIVE NEGATIVE   RBC / HPF 6-10 0 - 5 RBC/hpf   WBC, UA 0-5 0 - 5 WBC/hpf   Bacteria, UA RARE (A) NONE SEEN   Squamous Epithelial / HPF 0-5 0 - 5 /HPF   Mucus PRESENT     Comment: Performed at [facility], [address] [place], [place] [address]  Blood gas, arterial     Status: Abnormal   Collection Time: 12/10/23  9:10 AM  Result Value Ref Range   pH, Arterial 7.49 (H) 7.35 - 7.45   pCO2 arterial 34 32 - 48 mmHg   pO2, Arterial 74 (L) 83 - 108 mmHg   Bicarbonate 26.1 20.0 - 28.0 mmol/L   Acid-Base Excess 2.9 (H) 0.0 - 2.0 mmol/L   O2 Saturation 97.3 %   Patient temperature 36.2    Collection site LEFT RADIAL    Drawn by [id number]    Allens test (pass/fail) PASS PASS    Comment: Performed at [facility], [address], [place], [place] [address]  CBG monitoring, ED     Status: None   Collection Time: 12/10/23  1:24 PM  Result Value Ref Range   Glucose-Capillary 97 70 - 99 mg/dL    Comment: Glucose reference range applies only to samples taken after fasting for at least 8 hours.  MRSA Next Gen by PCR, Nasal     Status: None   Collection Time: 12/10/23  2:23 PM   Specimen: Nasal Mucosa; Nasal Swab  Result Value Ref Range   MRSA by PCR Next Gen NOT DETECTED NOT DETECTED    Comment: (NOTE) The GeneXpert MRSA Assay (FDA approved for NASAL specimens only), is one component of a comprehensive MRSA colonization surveillance program. It is not intended to diagnose MRSA infection nor to guide or monitor treatment for MRSA infections. Test performance is not FDA approved in patients less than 82 years old. Performed at [facility], [address] [place], [place] [address]   Culture, Respiratory w Gram Stain     Status: None (Preliminary result)    Collection Time: 12/10/23  2:46 PM   Specimen: Tracheal Aspirate; Respiratory  Result Value Ref Range   Specimen Description TRACHEAL ASPIRATE    Special Requests NONE    Gram Stain NO WBC SEEN FEW GRAM POSITIVE COCCI IN PAIRS  Culture      CULTURE REINCUBATED FOR BETTER GROWTH Performed at [facility], [address], [place], [place] [address]    Report Status PENDING   HIV Antibody (routine testing w rflx)     Status: None   Collection Time: 12/10/23  3:10 PM  Result Value Ref Range   HIV Screen 4th Generation wRfx Non Reactive Non Reactive    Comment: Performed at [facility], [address] [place], [place] [address]  CBC     Status: Abnormal   Collection Time: 12/10/23  3:10 PM  Result Value Ref Range   WBC 9.2 4.0 - 10.5 K/uL   RBC 5.89 (H) 4.22 - 5.81 MIL/uL   Hemoglobin 18.9 (H) 13.0 - 17.0 g/dL   HCT 44.6 (H) 60.9 - 47.9 %   MCV 93.9 80.0 - 100.0 fL   MCH 32.1 26.0 - 34.0 pg   MCHC 34.2 30.0 - 36.0 g/dL   RDW 86.4 88.4 - 84.4 %   Platelets 129 (L) 150 - 400 K/uL    Comment: SPECIMEN CHECKED FOR CLOTS See PLTC performed on sodium citrate specimen    nRBC 0.0 0.0 - 0.2 %    Comment: Performed at [facility], [address] [place], [place] [address]  Creatinine, serum     Status: Abnormal   Collection Time: 12/10/23  3:10 PM  Result Value Ref Range   Creatinine, Ser 1.40 (H) 0.61 - 1.24 mg/dL   GFR, Estimated 55 (L) >60 mL/min    Comment: (NOTE) Calculated using the CKD-EPI Creatinine Equation (2021) Performed at [facility], [address], [place], [place] [address]   Hemoglobin A1c     Status: None   Collection Time: 12/10/23  3:10 PM  Result Value Ref Range   Hgb A1c MFr Bld 5.1 4.8 - 5.6 %    Comment: (NOTE) Diagnosis of Diabetes The following HbA1c ranges recommended by the American Diabetes Association (ADA) may be used as an aid in the diagnosis of diabetes mellitus.  Hemoglobin             Suggested A1C NGSP%               Diagnosis  <5.7                   Non Diabetic  5.7-6.4                Pre-Diabetic  >6.4                   Diabetic  <7.0                   Glycemic control for                       adults with diabetes.     Mean Plasma Glucose 99.67 mg/dL    Comment: Performed at [facility], [address] [place], [place] [address]  Glucose, capillary     Status: None   Collection Time: 12/10/23  3:41 PM  Result Value Ref Range   Glucose-Capillary 76 70 - 99 mg/dL    Comment: Glucose reference range applies only to samples taken after fasting for at least 8 hours.  Aerobic/Anaerobic Culture w Gram Stain (surgical/deep wound)     Status: None (Preliminary result)   Collection Time: 12/10/23  6:23 PM   Specimen: Abscess  Result Value  Ref Range   Specimen Description ABSCESS    Special Requests NONE    Gram Stain      ABUNDANT WBC PRESENT, PREDOMINANTLY PMN MODERATE GRAM POSITIVE COCCI IN PAIRS AND CHAINS    Culture      TOO YOUNG TO READ Performed at [facility], [address] [place], [place] [address]    Report Status PENDING   Glucose, capillary     Status: Abnormal   Collection Time: 12/10/23  7:59 PM  Result Value Ref Range   Glucose-Capillary 110 (H) 70 - 99 mg/dL    Comment: Glucose reference range applies only to samples taken after fasting for at least 8 hours.  Glucose, capillary     Status: Abnormal   Collection Time: 12/10/23 11:59 PM  Result Value Ref Range   Glucose-Capillary 167 (H) 70 - 99 mg/dL    Comment: Glucose reference range applies only to samples taken after fasting for at least 8 hours.  Glucose, capillary     Status: Abnormal   Collection Time: 12/11/23  4:13 AM  Result Value Ref Range   Glucose-Capillary 132 (H) 70 - 99 mg/dL    Comment: Glucose reference range applies only to samples taken after fasting for at least 8 hours.  Triglycerides     Status: Abnormal   Collection Time: 12/11/23  4:23 AM  Result Value Ref Range    Triglycerides 154 (H) <150 mg/dL    Comment: Performed at [facility], [address] [place], [place] [address]  CBC     Status: Abnormal   Collection Time: 12/11/23  4:23 AM  Result Value Ref Range   WBC 12.6 (H) 4.0 - 10.5 K/uL   RBC 5.32 4.22 - 5.81 MIL/uL   Hemoglobin 17.1 (H) 13.0 - 17.0 g/dL   HCT 49.8 60.9 - 47.9 %   MCV 94.2 80.0 - 100.0 fL   MCH 32.1 26.0 - 34.0 pg   MCHC 34.1 30.0 - 36.0 g/dL   RDW 86.1 88.4 - 84.4 %   Platelets 161 150 - 400 K/uL   nRBC 0.0 0.0 - 0.2 %    Comment: Performed at [facility], [address], [place], [place] [address]  Basic metabolic panel     Status: Abnormal   Collection Time: 12/11/23  4:23 AM  Result Value Ref Range   Sodium 137 135 - 145 mmol/L   Potassium 3.8 3.5 - 5.1 mmol/L   [place] 100 98 - 111 mmol/L   CO2 24 22 - 32 mmol/L   Glucose, Bld 156 (H) 70 - 99 mg/dL    Comment: Glucose reference range applies only to samples taken after fasting for at least 8 hours.   BUN 9 8 - 23 mg/dL   Creatinine, Ser 8.47 (H) 0.61 - 1.24 mg/dL   Calcium  8.8 (L) 8.9 - 10.3 mg/dL   GFR, Estimated 50 (L) >60 mL/min    Comment: (NOTE) Calculated using the CKD-EPI Creatinine Equation (2021)    Anion gap 13 5 - 15    Comment: Performed at [facility], [address] [place], [place] [address]  Magnesium      Status: None   Collection Time: 12/11/23  4:23 AM  Result Value Ref Range   Magnesium  2.1 1.7 - 2.4 mg/dL    Comment: Performed at [facility], [address] [place], [place] [address]  Phosphorus     Status: None   Collection Time: 12/11/23  4:23 AM  Result Value Ref Range   Phosphorus 3.2 2.5 - 4.6 mg/dL    Comment: Performed at [facility], [address], [place], [place] [address]  Glucose, capillary     Status: Abnormal   Collection Time: 12/11/23  7:36 AM  Result Value Ref Range   Glucose-Capillary 140 (H) 70 - 99 mg/dL    Comment: Glucose reference range applies only to samples taken after  fasting for at least 8 hours.  Glucose, capillary     Status: Abnormal   Collection Time: 12/11/23 11:21 AM  Result Value Ref Range   Glucose-Capillary 119 (H) 70 - 99 mg/dL    Comment: Glucose reference range applies only to samples taken after fasting for at least 8 hours.   MR BRAIN W WO CONTRAST Result Date: 12/11/2023 CLINICAL DATA:  Seizure, new-onset, no history of trauma EXAM: MRI HEAD WITHOUT AND WITH CONTRAST TECHNIQUE: Multiplanar, multiecho pulse sequences of the brain and surrounding structures were obtained without and with intravenous contrast. CONTRAST:  7.5mL GADAVIST  GADOBUTROL  1 MMOL/ML IV SOLN COMPARISON:  CT the head dated December 10, 2023 and MRI of the head dated March 11, 2021. FINDINGS: Brain: There is generalized cerebral atrophy present and there is moderately advanced periventricular and deep cerebral white matter disease. There are patchy areas of increased T2 signal within the pons. There are focal areas of blooming artifact present within the thalami bilaterally. The lesion on the right has developed in the interim. There is no restricted diffusion to indicate acute or recent infarction. There is no abnormal parenchymal or meningeal enhancement. Vascular: Normal vascular flow voids. The venous sinuses appear to be patent. Skull and upper cervical spine: Normal marrow signal. No osseous lesions. There is a peripherally enhancing fluid collection within the subcutaneous fat of the occipital scalp just to the right of midline, measuring approximately 3.6 x 1.9 x 2.0 cm. Sinuses/Orbits: Clear paranasal sinuses. Status post bilateral lens surgery. Other: None. IMPRESSION: 1. There are focal areas of hemosiderin staining present within the thalami bilaterally, with the lesion on the right having developed in the interim. 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 2. 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 3. Peripherally enhancing fluid collection in the right  posterior scalp, which could represent a seroma or abscess. Recommend clinical correlation. Electronically Signed   By: Evalene Coho M.D.   On: 12/11/2023 10:34   Overnight EEG with video Result Date: 12/11/2023 Shelton Arlin KIDD, MD     12/11/2023 10:22 AM Patient Name: Philip [name] MRN: [id number] Epilepsy Attending: Arlin KIDD Shelton Referring Physician/Provider: Remi Pippin, NP Duration: 12/10/2023 1534 to 12/11/2023 1015 Patient history: 68 y.o. male with hx of HTN, Dm, CVA, HTN, Seizures, and GERD who initially presented to [facility] via EMS who were initially called out for left side weakness and slurred speech, however per EMS when they moved him to their stretcher he began having tonic clonic seizure like activity with decorticate posturing. EEG to evaluate for seizure Level of alertness: Awake, asleep AEDs during EEG study: LEV, Versed , Propofol  Technical aspects: This EEG study was done with scalp electrodes positioned according to the 10-20 International system of electrode placement. Electrical activity was reviewed with band pass filter of 1-70Hz , sensitivity of 7 uV/mm, display speed of 66mm/sec with a 60Hz  notched filter applied as appropriate. EEG data were recorded continuously and digitally stored.  Video monitoring was available and reviewed as appropriate. Description: EEG showed continuous generalized 3 to 6 Hz  theta-delta slowing with overriding 13 to 15 Hz beta activity.  Hyperventilation and photic stimulation were not performed.   EEG was disconnected on 12/11/2023 between 0812 to 0942 for mri brain ABNORMALITY - Continuous slow, generalized IMPRESSION: This study is suggestive of moderate to severe diffuse encephalopathy, likely related to sedation.  No seizures or epileptiform discharges were seen throughout the recording. Arlin MALVA Krebs   DG Chest Port 1 View Result Date: 12/11/2023 CLINICAL DATA:  Respiratory failure, endotracheal tube. EXAM: PORTABLE CHEST 1 VIEW COMPARISON:   12/10/2023. FINDINGS: Endotracheal tube terminates 4.4 cm above the carina. Nasogastric tube is followed into the stomach with the tip projecting beyond the inferior margin of the image. Heart size stable. Lungs are somewhat low in volume with minimal streaky atelectasis in the lung bases. No airspace consolidation or pleural fluid. IMPRESSION: Somewhat low lung volumes with minimal bibasilar atelectasis. Electronically Signed   By: Newell Eke M.D.   On: 12/11/2023 08:28   CT HEAD CODE STROKE WO CONTRAST Result Date: 12/10/2023 CLINICAL DATA:  Code stroke. Neuro deficit, acute, stroke suspected. Altered mental status. On head. EXAM: CT ANGIOGRAPHY HEAD AND NECK TECHNIQUE: Multidetector CT imaging of the head and neck was performed using the standard protocol during bolus administration of intravenous contrast. Multiplanar CT image reconstructions and MIPs were obtained to evaluate the vascular anatomy. Carotid stenosis measurements (when applicable) are obtained utilizing NASCET criteria, using the distal internal carotid diameter as the denominator. RADIATION DOSE REDUCTION: This exam was performed according to the departmental dose-optimization program which includes automated exposure control, adjustment of the mA and/or kV according to patient size and/or use of iterative reconstruction technique. CONTRAST:  75mL OMNIPAQUE  IOHEXOL  350 MG/ML SOLN COMPARISON:  Brain MRI 03/11/2021. MRA head 03/11/2021. MRA neck 03/11/2021. FINDINGS: CT HEAD FINDINGS Brain: Generalized cerebral atrophy. Lacunar infarcts within the bilateral thalami, new from the prior head CT of 03/10/2021 but otherwise age-indeterminate. Patchy and ill-defined hypoattenuation within the cerebral white matter, nonspecific but compatible with mild chronic small vessel ischemic disease. There is no acute intracranial hemorrhage. No demarcated cortical infarct. No extra-axial fluid collection. No evidence of an intracranial mass. No midline  shift. Vascular: No hyperdense vessel. Skull: No calvarial fracture or aggressive osseous lesion. Sinuses/Orbits: No mass or acute finding within the imaged orbits. Mild mucosal thickening within the bilateral ethmoid sinuses. Other: Chronic left nasal bone fracture deformity. Nonspecific 3.4 x 1.8 cm ovoid cutaneous/subcutaneous lesion within the suboccipital scalp just to the right of midline. (Series 2, image 10). ASPECTS (Alberta Stroke Program Early CT Score) - Ganglionic level infarction (caudate, lentiform nuclei, internal capsule, insula, M1-M3 cortex): 7 - Supraganglionic infarction (M4-M6 cortex): 3 Total score (0-10 with 10 being normal): 10 Review of the MIP images confirms the above findings These results were called by telephone at the time of interpretation on 12/10/2023 at 9:12 am to provider [facility] , who verbally acknowledged these results. CTA NECK FINDINGS Aortic arch: Standard aortic branching. The visible portions of the thoracic aorta are normal in caliber. No hemodynamically significant innominate or proximal subclavian artery stenosis. Right carotid system: CCA and ICA patent within the neck without stenosis. Minimal atherosclerotic plaque within the proximal ICA. Left carotid system: CCA and ICA patent within the neck without measurable stenosis. Mild atherosclerotic plaque within the proximal ICA. 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 In retrospect, this was present on the prior MRA  neck of 03/11/2021 and is unchanged in size since that time. Vertebral arteries: Codominant and patent within the neck without stenosis or significant atherosclerotic disease. Skeleton: No acute fracture or aggressive osseous lesion. Cervical spondylosis. Multilevel ventral osteophytes at the C2-C3 through T1-T2 levels. This includes bulky bridging ventral osteophytes at C4-C5, C5-C6,  C6-C7 and C7-T1. Other neck: No neck mass or cervical lymphadenopathy. Upper chest: No consolidation within the imaged lung apices. Partially visualized support tubes. Review of the MIP images confirms the above findings CTA HEAD FINDINGS Anterior circulation: The intracranial internal carotid arteries are patent. The M1 middle cerebral arteries are patent. No M2 proximal branch occlusion or high-grade proximal stenosis. The anterior cerebral arteries are patent. No intracranial aneurysm is identified. Posterior circulation: The intracranial vertebral arteries are patent. The basilar artery is patent. The posterior cerebral arteries are patent. 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 Severe, near-occlusive stenosis within the left posterior cerebral artery at the P2/P3 junction, also new from the prior MRA (series 11, image 28). Posterior communicating arteries are diminutive or absent, bilaterally. Venous sinuses: Limited assessment for dural venous sinus thrombosis due to contrast timing. Anatomic variants: As described. Review of the MIP images confirms the above findings CTA head impressions #1 and #2 were called by telephone at the time of interpretation on 12/10/2023 at 9:32 am to provider [facility] , who verbally acknowledged these results. IMPRESSION: Non-contrast head CT: 1. Lacunar infarcts within the bilateral thalami which are new from the prior head CT of 03/10/2021, but otherwise age-indeterminate. Consider a brain MRI for further evaluation. 2. Background parenchymal atrophy and chronic small vessel ischemic disease. 3. Nonspecific 3.4 x 1.8 cm ovoid cutaneous/subcutaneous lesion within the suboccipital scalp just to the right of midline. Direct examination recommended. CTA neck: 1. The common carotid and internal carotid arteries are patent within the neck without measurable stenosis. Mild atherosclerotic  plaque bilaterally, as described. 2. 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 3. Vertebral arteries patent within the neck without stenosis or significant atherosclerotic disease. CT head: 1. 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 2. Severe stenosis within the right posterior cerebral artery P2 segment, new from the prior MRA. Electronically Signed   By: Rockey Childs D.O.   On: 12/10/2023 09:34   CT ANGIO HEAD NECK W WO CM Result Date: 12/10/2023 CLINICAL DATA:  Code stroke. Neuro deficit, acute, stroke suspected. Altered mental status. On head. EXAM: CT ANGIOGRAPHY HEAD AND NECK TECHNIQUE: Multidetector CT imaging of the head and neck was performed using the standard protocol during bolus administration of intravenous contrast. Multiplanar CT image reconstructions and MIPs were obtained to evaluate the vascular anatomy. Carotid stenosis measurements (when applicable) are obtained utilizing NASCET criteria, using the distal internal carotid diameter as the denominator. RADIATION DOSE REDUCTION: This exam was performed according to the departmental dose-optimization program which includes automated exposure control, adjustment of the mA and/or kV according to patient size and/or use of iterative reconstruction technique. CONTRAST:  75mL OMNIPAQUE  IOHEXOL  350 MG/ML SOLN COMPARISON:  Brain MRI 03/11/2021. MRA head 03/11/2021. MRA neck 03/11/2021. FINDINGS: CT HEAD FINDINGS Brain: Generalized cerebral atrophy. Lacunar infarcts within the bilateral thalami, new from the prior head CT of 03/10/2021 but otherwise age-indeterminate. Patchy and ill-defined hypoattenuation within the  cerebral white matter, nonspecific but compatible with mild chronic small vessel ischemic disease. There is no acute intracranial hemorrhage. No demarcated cortical  infarct. No extra-axial fluid collection. No evidence of an intracranial mass. No midline shift. Vascular: No hyperdense vessel. Skull: No calvarial fracture or aggressive osseous lesion. Sinuses/Orbits: No mass or acute finding within the imaged orbits. Mild mucosal thickening within the bilateral ethmoid sinuses. Other: Chronic left nasal bone fracture deformity. Nonspecific 3.4 x 1.8 cm ovoid cutaneous/subcutaneous lesion within the suboccipital scalp just to the right of midline. (Series 2, image 10). ASPECTS (Alberta Stroke Program Early CT Score) - Ganglionic level infarction (caudate, lentiform nuclei, internal capsule, insula, M1-M3 cortex): 7 - Supraganglionic infarction (M4-M6 cortex): 3 Total score (0-10 with 10 being normal): 10 Review of the MIP images confirms the above findings These results were called by telephone at the time of interpretation on 12/10/2023 at 9:12 am to provider [facility] , who verbally acknowledged these results. CTA NECK FINDINGS Aortic arch: Standard aortic branching. The visible portions of the thoracic aorta are normal in caliber. No hemodynamically significant innominate or proximal subclavian artery stenosis. Right carotid system: CCA and ICA patent within the neck without stenosis. Minimal atherosclerotic plaque within the proximal ICA. Left carotid system: CCA and ICA patent within the neck without measurable stenosis. Mild atherosclerotic plaque within the proximal ICA. 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 In retrospect, this was present on the prior MRA neck of 03/11/2021 and is unchanged in size since that time. Vertebral arteries: Codominant and patent within the neck without stenosis or significant atherosclerotic disease. Skeleton: No acute fracture or aggressive osseous lesion. Cervical spondylosis. Multilevel ventral osteophytes at the C2-C3  through T1-T2 levels. This includes bulky bridging ventral osteophytes at C4-C5, C5-C6, C6-C7 and C7-T1. Other neck: No neck mass or cervical lymphadenopathy. Upper chest: No consolidation within the imaged lung apices. Partially visualized support tubes. Review of the MIP images confirms the above findings CTA HEAD FINDINGS Anterior circulation: The intracranial internal carotid arteries are patent. The M1 middle cerebral arteries are patent. No M2 proximal branch occlusion or high-grade proximal stenosis. The anterior cerebral arteries are patent. No intracranial aneurysm is identified. Posterior circulation: The intracranial vertebral arteries are patent. The basilar artery is patent. The posterior cerebral arteries are patent. 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 Severe, near-occlusive stenosis within the left posterior cerebral artery at the P2/P3 junction, also new from the prior MRA (series 11, image 28). Posterior communicating arteries are diminutive or absent, bilaterally. Venous sinuses: Limited assessment for dural venous sinus thrombosis due to contrast timing. Anatomic variants: As described. Review of the MIP images confirms the above findings CTA head impressions #1 and #2 were called by telephone at the time of interpretation on 12/10/2023 at 9:32 am to provider [facility] , who verbally acknowledged these results. IMPRESSION: Non-contrast head CT: 1. Lacunar infarcts within the bilateral thalami which are new from the prior head CT of 03/10/2021, but otherwise age-indeterminate. Consider a brain MRI for further evaluation. 2. Background parenchymal atrophy and chronic small vessel ischemic disease. 3. Nonspecific 3.4 x 1.8 cm ovoid cutaneous/subcutaneous lesion within the suboccipital scalp just to the right of midline. Direct examination recommended. CTA neck: 1. The common carotid and internal carotid  arteries are patent within the neck without measurable  stenosis. Mild atherosclerotic plaque bilaterally, as described. 2. 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 3. Vertebral arteries patent within the neck without stenosis or significant atherosclerotic disease. CT head: 1. 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 2. Severe stenosis within the right posterior cerebral artery P2 segment, new from the prior MRA. Electronically Signed   By: Rockey Childs D.O.   On: 12/10/2023 09:34   DG Chest Portable 1 View Result Date: 12/10/2023 CLINICAL DATA:  Endotracheal tube placement. EXAM: PORTABLE CHEST 1 VIEW COMPARISON:  None recent.  Radiographs 01/24/2013. FINDINGS: 0826 hours. Tip of the endotracheal tube is approximately 3.2 cm above the carina. Enteric tube projects below the diaphragm, tip not visualized. The heart size and mediastinal contours are stable. The lungs appear clear. The right costophrenic angle is incompletely visualized. No evidence of pneumothorax or large pleural effusion. Mild degenerative changes of the spine without evidence of acute osseous abnormality. IMPRESSION: Satisfactory position of endotracheal and enteric tubes. No evidence of acute cardiopulmonary process. Electronically Signed   By: Elsie Perone M.D.   On: 12/10/2023 08:47      Assessment/Plan Posterior scalp abscess The patient has been seen, examined, labs, chart, vitals, and imaging reviewed.  He does have a posterior scalp abscess/fluid collection.  Plan for I&D at the bedside today.  See separate procedure note for this.  Will plan to do dressing changes once daily.   FEN - NPO VTE - heparin  ID - none currently  CVA Seizure disorder HTN HLD DM Hep C PSA  I reviewed nursing notes, hospitalist notes, last 24 h vitals and pain  scores, last 48 h intake and output, last 24 h labs and trends, and last 24 h imaging results.  Almarie Pringle, [facility] 12/11/2023, 1:54 PM Please see Amion for pager number during day hours 7:00am-4:30pm or 7:00am -11:30am on weekends

## 2023-12-11 NOTE — Progress Notes (Signed)
 Transported on vent from 4N to MRI and back. No complications noted.

## 2023-12-11 NOTE — Progress Notes (Signed)
 NAME:  Philip [name], MRN:  [id number], DOB:  04/14/1956, LOS: 1 ADMISSION DATE:  12/10/2023, CONSULTATION DATE:  7/17  REFERRING MD:  [facility], CHIEF COMPLAINT:  AMS   History of Present Illness:  68yo male with hx difficult to control HTN, DM presented to [facility] ER 7/17 with acute onset L sided weakness. He called 911 and when EMS was placing him on the stretcher he had witnessed seizure with decorticate posturing for several minutes. This resolved with 5mg  midazolam . He was obtunded on arrival to ER and intubated. Hypertensive with BP 250/130, improved with labetalol , propofol . He was given keppra  and EDP d/w [facility] neuro who would [facility] at [facility] for cEEG. PCCM asked to accept on tx.   Notable ER findings POS cocaine Hgb 17.9 Plt 120     Pertinent  Medical History   has a past medical history of Chronic cholecystitis with calculus (02/04/2013), CVA (cerebral vascular accident) (09/16/2012), Diabetes mellitus without complication (HCC), Erectile dysfunction, Gallstones (01/27/2013), GERD (gastroesophageal reflux disease), H. pylori infection (09/28/2017), Headache(784.0), Hepatitis C, History of kidney stones, History of migraine, Hypertension, Hypertension, Nausea, Seizures (HCC), Short-term memory loss, Sleep apnea, Stroke (HCC), Tobacco user, and Umbilical hernia (02/04/2013).   Significant Hospital Events: Including procedures, antibiotic start and stop dates in addition to other pertinent events   7/17 ADMITTED. Hypertensive crisis, seizure, CT/ CTA head and neck 7/17 1. Lacunar infarcts within the bilateral thalami which are new from the prior head CT of 03/10/2021, but otherwise age-indeterminate. 2. Background parenchymal atrophy and chronic small vessel ischemic disease.3. Nonspecific 3.4 x 1.8 cm ovoid cutaneous/subcutaneous lesion within the suboccipital scalp just to the right of midline. Direct examination recommended. CTA neck:  1. The common carotid and internal carotid arteries are  patent within the neck without measurable stenosis. Mild atherosclerotic plaque bilaterally, as described. 2. 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 3. Vertebral arteries patent within the neck without stenosis or significant atherosclerotic disease.  CTA head: 1. 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 2. Severe stenosis within the right posterior cerebral artery P2 segment, new from the prior MRA. 7/18 Intubated, agitation overnight, waiting on EEG results as well as MRI   Interim History / Subjective:  Agitation overnight, increasing sedation  On levophed  to maintain BP requirements SBP goal 150-180   Objective    Blood pressure (!) 162/95, pulse (!) 53, temperature 98.7 F (37.1 C), temperature source Axillary, resp. rate 15, height 5' 7.01 (1.702 m), weight 70.8 kg, SpO2 99%.    Vent Mode: PRVC FiO2 (%):  [40 %-60 %] 40 % Set Rate:  [15 bmp-20 bmp] 15 bmp Vt Set:  [530 mL] 530 mL PEEP:  [5 cmH20] 5 cmH20 Plateau Pressure:  [15 cmH20-17 cmH20] 15 cmH20   Intake/Output Summary (Last 24 hours) at 12/11/2023 0804 Last data filed at 12/11/2023 0600 Gross per 24 hour  Intake 1112.44 ml  Output 425 ml  Net 687.44 ml   Filed Weights   12/10/23 0829 12/11/23 0440  Weight: 73.7 kg 70.8 kg    Examination: General: acute on chronic older adult male, lying in icu bed on vent in NAD HEENT: Normocephalic, PERRLA intact- B/L- sluggish, ETT, OG, Pink MM CV: s1,s2, RRR, no MRG, No JVD  pulm: clear, diminished, no distress on vent  Abs: bs active, soft  Extremities: no edema, no deformity, moves to painful stimuli, sedated  Skin: Scalp abscess  Neuro: Rass -3, responds to painful stimuli, cough gag reflex present  GU: foley intact- diminished urine output   Resolved problem list   Mild anion gap  metabolic acidosis (on presentation)  Assessment and Plan   Acute metabolic encephalopathy w/ L sided weakness complicated by witnessed Seizure and age undetermined stroke (new lacunar infarcts within the Bilateral thalami new from CT head of 03/10/2021)  -suspect mixed picture post-ictal and hypertensive encephalopathy  -keppra  loaded No seizures witnessed overnight, on EEG- pending review  P Appreciate neuro following, appreciate assistance  Loaded with Keppra , waiting for EEG results, plan to continue 500mg  BID On propofol  gtt with agitation- having to increase overnight  Continue neuroprotective measures- continue euglycemia, HOB > 30, normoxia, normocapnia, euvolemia, and normalize electrolytes as appropriate, normothermia  Obtaining MRI this AM- f/u with Continue seizure precautions   HTN crisis difficult to control HTN at baseline c/b cocaine abuse  Arrived ~8am, initial SBP 250s Had to be started on levophed  due to sedation and meeting SBP goal requirements P:  Goal SBP 140-160 now, continue to wean levophed  as tolerated  Will need to restart PO antihypertensives once coming off levophed   ECHO pending follow up with results Continue Cardiac Telemetry  Continue to educate cocaine cessation   Acute hypoxic respiratory failure - in setting seizure, AMS  W/ post intubation iatrogenic resp alk  P: Continue ventilator support and lung protective strategies  Continue LTVV  Wean PEEP and Fio2 requirements to sat goal of >92%  HOB > 30 degrees Plat < 30  Aim for Driving pressures < 15  Intermittent Chest X-ray and ABGS VAP and PAD protocols in place  Wean sedation when approved by neuro, pending MRI and EEG results     AKI on CKD - baseline Scr ~1.2-1.3  Cr increase from 1.28 to 1.4>> 1.52, minimal urine output per foley  P: Continue to trend renal function daily  Continue to monitor and optimize electrolytes daily Continue to monitor urine output Continue strict  I/Os Continue Adequate renal perfusion  Avoid nephrotoxic agents  Will add LR at 37ml/hr  DM  7/17 Hgb A1C 5.1  P: Continue goal of 140-180 Continue SSI   Soft tissue abscess posterior scalp P: I/D scheduled by [facility] at beside later today on 7/18  Continue IV Vanc Continue to follow cultures Continue to trend WBC and fever curve    Best Practice (right click and Reselect all SmartList Selections daily)   Diet/type: NPO DVT prophylaxis prophylactic heparin   Pressure ulcer(s): N/A GI prophylaxis: N/AH2B Lines: N/A Foley:  Yes, and it is still needed Code Status:  full code Last date of multidisciplinary goals of care discussion [pending  Critical care time: 45 mins    Christian Adore Kithcart AGACNP-BC   [facility] 12/11/2023, 8:34 AM  Please see [email or web address] for pager details.  From 7A-7P if no response, please call [phone]. After hours, please call ELink [phone].

## 2023-12-11 NOTE — Procedures (Signed)
 PRE-OP DIAGNOSIS: _ scalp abscess POST-OP DIAGNOSIS: Same  PROCEDURE: incision and drainage of abscess Performing clinician: _ Almarie Pringle, PA-C Supervising Physician (if applicable): Ivery Hummer, MD Analgesia: 250 mcg fentanyl , 6 mL intradermal lidocaine  as below  PROCEDURE:  Verbal and written consent was obtained prior to the procedure.  The area was prepared and draped in the usual, sterile manner. The site  was anesthetized with 1% lidocaine  with epinephrine . A linear incision  along the local skin lines was made and the serosanguinous material expressed.  The abcess was explored thoroughly and sequestered pockets were opened.  Bleeding was minimal.  Packing:  1/4 iodoform  Followup: The patient tolerated the procedure well without  complications. Will write orders for daily dressing changes. Cultures collected yesterday are pending.    Almarie Pringle, PA-C [facility] Please see Amion for pager number during day hours 7:00am-4:30pm

## 2023-12-11 NOTE — Progress Notes (Signed)
 Echocardiogram 2D Echocardiogram has been performed.  Damien FALCON Jonhatan Hearty RDCS 12/11/2023, 10:07 AM

## 2023-12-11 NOTE — Procedures (Signed)
 Extubation Procedure Note  Patient Details:   Name: Philip [name] DOB: 1956-03-14 MRN: [id number]   Airway Documentation:    Vent end date: 12/11/23 Vent end time: 1545   Evaluation  O2 sats: stable throughout Complications: No apparent complications Patient did tolerate procedure well. Bilateral Breath Sounds: Clear, Diminished   Yes  Patient was extubate to 3 LPM [name]. Cuff leak prior to extubation. Patient had a strong cough and able to speak name. No stridor noted. Vitals stable. RN at bedside.   [name] M 12/11/2023, 3:54 PM

## 2023-12-12 ENCOUNTER — Inpatient Hospital Stay (HOSPITAL_COMMUNITY)

## 2023-12-12 ENCOUNTER — Other Ambulatory Visit: Payer: Self-pay

## 2023-12-12 DIAGNOSIS — R569 Unspecified convulsions: Secondary | ICD-10-CM | POA: Diagnosis not present

## 2023-12-12 DIAGNOSIS — J9601 Acute respiratory failure with hypoxia: Secondary | ICD-10-CM | POA: Diagnosis not present

## 2023-12-12 DIAGNOSIS — G9341 Metabolic encephalopathy: Secondary | ICD-10-CM | POA: Diagnosis not present

## 2023-12-12 DIAGNOSIS — I169 Hypertensive crisis, unspecified: Secondary | ICD-10-CM | POA: Diagnosis not present

## 2023-12-12 LAB — BASIC METABOLIC PANEL WITH GFR
Anion gap: 13 (ref 5–15)
BUN: 10 mg/dL (ref 8–23)
CO2: 24 mmol/L (ref 22–32)
Calcium: 8.5 mg/dL — ABNORMAL LOW (ref 8.9–10.3)
Chloride: 100 mmol/L (ref 98–111)
Creatinine, Ser: 1.18 mg/dL (ref 0.61–1.24)
GFR, Estimated: >60 mL/min (ref >60)
Glucose, Bld: 101 mg/dL — ABNORMAL HIGH (ref 70–99)
Potassium: 3.8 mmol/L (ref 3.5–5.1)
Sodium: 137 mmol/L (ref 135–145)

## 2023-12-12 LAB — GLUCOSE, CAPILLARY
Glucose-Capillary: 103 mg/dL — ABNORMAL HIGH (ref 70–99)
Glucose-Capillary: 104 mg/dL — ABNORMAL HIGH (ref 70–99)
Glucose-Capillary: 106 mg/dL — ABNORMAL HIGH (ref 70–99)
Glucose-Capillary: 108 mg/dL — ABNORMAL HIGH (ref 70–99)
Glucose-Capillary: 110 mg/dL — ABNORMAL HIGH (ref 70–99)
Glucose-Capillary: 117 mg/dL — ABNORMAL HIGH (ref 70–99)
Glucose-Capillary: 122 mg/dL — ABNORMAL HIGH (ref 70–99)

## 2023-12-12 LAB — CBC
HCT: 45.9 % (ref 39.0–52.0)
Hemoglobin: 15.3 g/dL (ref 13.0–17.0)
MCH: 31.5 pg (ref 26.0–34.0)
MCHC: 33.3 g/dL (ref 30.0–36.0)
MCV: 94.6 fL (ref 80.0–100.0)
Platelets: 123 K/uL — ABNORMAL LOW (ref 150–400)
RBC: 4.85 MIL/uL (ref 4.22–5.81)
RDW: 13.5 % (ref 11.5–15.5)
WBC: 7.5 K/uL (ref 4.0–10.5)
nRBC: 0 % (ref 0.0–0.2)

## 2023-12-12 LAB — MAGNESIUM: Magnesium: 2.1 mg/dL (ref 1.7–2.4)

## 2023-12-12 LAB — CULTURE, RESPIRATORY W GRAM STAIN
Culture: NORMAL
Gram Stain: NONE SEEN

## 2023-12-12 LAB — PHOSPHORUS: Phosphorus: 2.9 mg/dL (ref 2.5–4.6)

## 2023-12-12 MED ORDER — BOOST / RESOURCE BREEZE PO LIQD CUSTOM
1.0000 | Freq: Three times a day (TID) | ORAL | Status: DC
  Administered 2023-12-12 – 2023-12-17 (×12): 1 via ORAL

## 2023-12-12 MED ORDER — AMLODIPINE BESYLATE 10 MG PO TABS
10.0000 mg | ORAL_TABLET | Freq: Every day | ORAL | Status: DC
Start: 2023-12-12 — End: 2023-12-18
  Administered 2023-12-12 – 2023-12-17 (×6): 10 mg via ORAL
  Filled 2023-12-12 (×6): qty 1

## 2023-12-12 MED ORDER — NICOTINE 21 MG/24HR TD PT24: 21.0000 mg | MEDICATED_PATCH | Freq: Every day | TRANSDERMAL | Status: DC

## 2023-12-12 MED ORDER — NICOTINE 21 MG/24HR TD PT24
21.0000 mg | MEDICATED_PATCH | Freq: Every day | TRANSDERMAL | Status: DC
  Administered 2023-12-12 – 2023-12-13 (×2): 21 mg via TRANSDERMAL
  Filled 2023-12-12 (×3): qty 1

## 2023-12-12 MED ORDER — GABAPENTIN 250 MG/5ML PO SOLN
300.0000 mg | Freq: Every day | ORAL | Status: DC
  Administered 2023-12-12 – 2023-12-16 (×5): 300 mg via ORAL
  Filled 2023-12-12 (×7): qty 6

## 2023-12-12 MED ORDER — METOPROLOL SUCCINATE ER 50 MG PO TB24: 50.0000 mg | ORAL_TABLET | Freq: Every day | ORAL | Status: DC

## 2023-12-12 MED ORDER — DOCUSATE SODIUM 50 MG/5ML PO LIQD
100.0000 mg | Freq: Two times a day (BID) | ORAL | Status: DC
  Administered 2023-12-12 – 2023-12-17 (×10): 100 mg via ORAL
  Filled 2023-12-12 (×11): qty 10

## 2023-12-12 MED ORDER — TOPIRAMATE 25 MG PO TABS
50.0000 mg | ORAL_TABLET | Freq: Two times a day (BID) | ORAL | Status: DC
  Administered 2023-12-12 – 2023-12-17 (×11): 50 mg via ORAL
  Filled 2023-12-12 (×11): qty 2

## 2023-12-12 MED ORDER — POLYETHYLENE GLYCOL 3350 17 G PO PACK: 17.0000 g | PACK | Freq: Every day | ORAL | Status: DC | PRN

## 2023-12-12 MED ORDER — HYDRALAZINE HCL 50 MG PO TABS
50.0000 mg | ORAL_TABLET | Freq: Three times a day (TID) | ORAL | Status: DC
  Administered 2023-12-12 – 2023-12-15 (×10): 50 mg via ORAL
  Filled 2023-12-12 (×10): qty 1

## 2023-12-12 NOTE — Progress Notes (Signed)
 Reassessed this afternoon.  Off Precedex , up in the chair.  Able to take pills with blood pressure controlled.  Stable to transfer to telemetry. RN agrees.  Leita SHAUNNA Gaskins, DO 12/12/23 2:50 PM [facility]  For contact information, see Amion. If no response to pager, please call PCCM consult pager. After hours, 7PM- 7AM, please call Elink.

## 2023-12-12 NOTE — Progress Notes (Signed)
 Received patient from unit via wheelchair - patient has multiple sores and documented.  Patient attempting to get out of bed wants to go smoke and get a ride home.

## 2023-12-12 NOTE — Progress Notes (Signed)
 Trauma/Critical Care Follow Up Note  Subjective:    Overnight Issues:   Objective:  Vital signs for last 24 hours: Temp:  [97.4 F (36.3 C)-99.9 F (37.7 C)] 97.8 F (36.6 C) (07/19 0400) Pulse Rate:  [44-95] 47 (07/19 0700) Resp:  [9-22] 13 (07/19 0700) BP: (118-174)/(75-108) 151/90 (07/19 0700) SpO2:  [97 %-100 %] 100 % (07/19 0700) Weight:  [70.9 kg] 70.9 kg (07/19 0407)  Hemodynamic parameters for last 24 hours:    Intake/Output from previous day: 07/18 0701 - 07/19 0700 In: 2088.8 [I.V.:1828.9; IV Piggyback:259.9] Out: -   Intake/Output this shift: No intake/output data recorded.  Vent settings for last 24 hours:    Physical Exam:  Gen: comfortable, no distress Neuro: follows commands for nursing HEENT: EEG leads in place, wound with minimal drainage, iodoform packing in place Neck: supple CV: RRR Pulm: unlabored breathing on    Results for orders placed or performed during the hospital encounter of 12/10/23 (from the past 24 hours)  Glucose, capillary     Status: Abnormal   Collection Time: 12/11/23 11:21 AM  Result Value Ref Range   Glucose-Capillary 119 (H) 70 - 99 mg/dL  Glucose, capillary     Status: Abnormal   Collection Time: 12/11/23  3:28 PM  Result Value Ref Range   Glucose-Capillary 108 (H) 70 - 99 mg/dL  Glucose, capillary     Status: Abnormal   Collection Time: 12/11/23  7:47 PM  Result Value Ref Range   Glucose-Capillary 139 (H) 70 - 99 mg/dL  Glucose, capillary     Status: Abnormal   Collection Time: 12/12/23 12:15 AM  Result Value Ref Range   Glucose-Capillary 122 (H) 70 - 99 mg/dL  Glucose, capillary     Status: Abnormal   Collection Time: 12/12/23  3:59 AM  Result Value Ref Range   Glucose-Capillary 110 (H) 70 - 99 mg/dL  Basic metabolic panel with GFR     Status: Abnormal   Collection Time: 12/12/23  5:18 AM  Result Value Ref Range   Sodium 137 135 - 145 mmol/L   Potassium 3.8 3.5 - 5.1 mmol/L   Chloride 100 98 - 111  mmol/L   CO2 24 22 - 32 mmol/L   Glucose, Bld 101 (H) 70 - 99 mg/dL   BUN 10 8 - 23 mg/dL   Creatinine, Ser 8.81 0.61 - 1.24 mg/dL   Calcium  8.5 (L) 8.9 - 10.3 mg/dL   GFR, Estimated >39 >39 mL/min   Anion gap 13 5 - 15  CBC     Status: Abnormal   Collection Time: 12/12/23  5:18 AM  Result Value Ref Range   WBC 7.5 4.0 - 10.5 K/uL   RBC 4.85 4.22 - 5.81 MIL/uL   Hemoglobin 15.3 13.0 - 17.0 g/dL   HCT 54.0 60.9 - 47.9 %   MCV 94.6 80.0 - 100.0 fL   MCH 31.5 26.0 - 34.0 pg   MCHC 33.3 30.0 - 36.0 g/dL   RDW 86.4 88.4 - 84.4 %   Platelets 123 (L) 150 - 400 K/uL   nRBC 0.0 0.0 - 0.2 %  Magnesium      Status: None   Collection Time: 12/12/23  5:18 AM  Result Value Ref Range   Magnesium  2.1 1.7 - 2.4 mg/dL  Phosphorus     Status: None   Collection Time: 12/12/23  5:18 AM  Result Value Ref Range   Phosphorus 2.9 2.5 - 4.6 mg/dL  Glucose, capillary  Status: Abnormal   Collection Time: 12/12/23  7:30 AM  Result Value Ref Range   Glucose-Capillary 117 (H) 70 - 99 mg/dL    Assessment & Plan: The plan of care was discussed with the bedside nurse for the night, who is in agreement with this plan and no additional concerns were raised.   Present on Admission:  Hypertensive crisis    LOS: 2 days   Additional comments:I reviewed the patient's new clinical lab test results.   and I reviewed the patients new imaging test results.    Posterior scalp abscess - s/p bedside I&D and iodoform packing 7/18, done in the afternoon, so will leave packing today and plan to remove 7/20   FEN - per primary VTE - heparin  ID - abscess culture with GPCs in pairs and chains, S&S P, empiric vanc started 7/18   CVA Seizure disorder HTN HLD DM Hep C PSA  Philip GEANNIE Hanger, MD Trauma & General Surgery Please use [email or web address] to contact on call provider  12/12/2023  *Care during the described time interval was provided by me. I have reviewed this patient's available data, including medical  history, events of note, physical examination and test results as part of my evaluation.

## 2023-12-12 NOTE — Progress Notes (Signed)
LTM EEG disconnected - no skin breakdown at unhook. [facility] notified.  

## 2023-12-12 NOTE — Progress Notes (Signed)
 Attempted to see patient in f/u, he was OTF being transferred out of the unit 2/2 significant clinical improvement. Will f/u again tmrw AM.  Elida Ross, MD [facility] [phone]  If 7pm- 7am, please page neurology on call as listed in AMION.

## 2023-12-12 NOTE — Progress Notes (Signed)
 NAME:  Philip [name], MRN:  [id number], DOB:  [birth year], LOS: 2 ADMISSION DATE:  12/10/2023, CONSULTATION DATE:  7/17  REFERRING MD:  [facility], CHIEF COMPLAINT:  AMS   History of Present Illness:  68yo male with hx difficult to control HTN, DM presented to [facility] ER 7/17 with acute onset L sided weakness. He called 911 and when EMS was placing him on the stretcher he had witnessed seizure with decorticate posturing for several minutes. This resolved with 5mg  midazolam . He was obtunded on arrival to ER and intubated. Hypertensive with BP 250/130, improved with labetalol , propofol . He was given keppra  and EDP d/w [facility] neuro who would [facility] at [facility] for cEEG. PCCM asked to accept on tx.   Notable ER findings POS cocaine Hgb 17.9 Plt 120  Pertinent  Medical History   has a past medical history of Chronic cholecystitis with calculus (02/04/2013), CVA (cerebral vascular accident) (09/16/2012), Diabetes mellitus without complication (HCC), Erectile dysfunction, Gallstones (01/27/2013), GERD (gastroesophageal reflux disease), H. pylori infection (09/28/2017), Headache(784.0), Hepatitis C, History of kidney stones, History of migraine, Hypertension, Hypertension, Nausea, Seizures (HCC), Short-term memory loss, Sleep apnea, Stroke (HCC), Tobacco user, and Umbilical hernia (02/04/2013).   Significant Hospital Events: Including procedures, antibiotic start and stop dates in addition to other pertinent events   7/17 ADMITTED. Hypertensive crisis, seizure, [facility]G & MRI fine, weaned off propofol , extubated. Scalp abscess lanced.   Interim History / Subjective:  Overnight he required low dose precedex .  Today his most significant complaint is wanting to be out of restraints.  Objective    Blood pressure (!) 168/93, pulse 61, temperature 97.8 F (36.6 C), temperature source Axillary, resp. rate (!) 21, height 5' 7.01 (1.702 m), weight 70.9 kg, SpO2 100%.        Intake/Output Summary (Last 24  hours) at 12/12/2023 0954 Last data filed at 12/12/2023 0900 Gross per 24 hour  Intake 2150.97 ml  Output --  Net 2150.97 ml   Filed Weights   12/10/23 0829 12/11/23 0440 12/12/23 0407  Weight: 73.7 kg 70.8 kg 70.9 kg    Examination: General: Chronically ill-appearing man lying in bed no acute distress HEENT: Dressing over wound on posterior scalp. CV: S1-S2, regular rate and rhythm pulm: Breathing comfortably on nasal cannula, CTAB Abs: Soft, nontender Neuro: Lethargic but arouses to verbal stimulation.  Recognizes family members and able to call them by name.  Moving all extremities purposefully. Extremities: No peripheral edema GU: External urinary catheter with dark urine  BUN 10 Creatinine 1.18 WBC 7.5 H/H15.3/45.9 Platelets 123  Abscess culture: abundant PMN, mod GPC  Trach aspirate culture: few GPC  Resolved problem list   Mild anion gap metabolic acidosis (on presentation)  Assessment and Plan   Acute metabolic encephalopathy due to hypertensive emergency-had acute onset L sided weaknes, then witnessed seizure and age undetermined stroke.;no new strokes seen on imaging. Thalami cavernous angiomas bilaterally.  Now with mild ICU delirium. -Keppra  per neurology.  Continue PTA gabapentin  and Topamax  - Long-term needs better hypertension control - Weaning off Precedex  today.  -Family is at bedside which helps with delirium management - PT, OT -Neuroprotective measures -Okay to discontinue end-tidal CO2 monitoring - Will discontinue restraints as he is able to tolerate.  I have asked his family to help us  with redirecting him when he tries to get out of bed.  Hypertensive emergency-cocaine abuse and history of medication noncompliance.  His sister corroborates that this has been a frustration with his medication noncompliance. -  Goal systolic blood pressure less than 160. - Resume PTA amlodipine  and hydralazine .  Hold prescribed PTA metoprolol > pharmacy fill records  does not look like this has been currently used. -Strongly recommend cocaine avoidance - Telemetry monitoring   Acute hypoxic respiratory failure with hypoxia requiring mechanical ventilation due to seizure, likely aspiration> extubated 7/18 -Pulmonary hygiene - Wean supplemental oxygen  AKI  resolved; no longer appears to have baseline CKD -Continue strict I's/O - Continue IV fluids until able to take p.o. meds - Renally dose meds and avoid nephrotoxic meds - Monitor  Hyperglycemia; no evidence of DM-- not on PTA meds and A1c is 5.1  -Sliding scale insulin  - Goal blood glucose 140-180  Skin abscess posterior R scalp  -Continue vancomycin  - Follow cultures - Appreciate surgery's management, wound care per their recommendations.  Packing to be removed by surgery tomorrow.  Patient Sister Philip [name] and her son were updated at bedside.  All questions answered.  Best Practice (right click and Reselect all SmartList Selections daily)   Diet/type: NPO> ADAT after swallow study DVT prophylaxis prophylactic heparin   Pressure ulcer(s): N/A GI prophylaxis: PPI Lines: N/A Foley:  removal ordered  Code Status:  full code Last date of multidisciplinary goals of care discussion [pending  Critical care time:       This patient is critically ill with multiple organ system failure which requires frequent high complexity decision making, assessment, support, evaluation, and titration of therapies. This was completed through the application of advanced monitoring technologies and extensive interpretation of multiple databases. During this encounter critical care time was devoted to patient care services described in this note for 40 minutes.  Leita SHAUNNA Gaskins, DO 12/12/23 11:50 AM [facility]  For contact information, see Amion. If no response to pager, please call PCCM consult pager. After hours, 7PM- 7AM, please call Elink.

## 2023-12-12 NOTE — Evaluation (Signed)
 Physical Therapy Evaluation Patient Details Name: Philip [name] MRN: [id number] DOB: 07-25-1955 Today's Date: 12/12/2023  History of Present Illness  68 y/o gentleman who awoke 12/10/23 with L-sided weakness and dysarthria. +seizure with EMS; intubated; HTN emergency 227/138; +scalp abscess with I&D bedside 7/18; CT Head bil thalami infarcts; CTA head near occlusive stenosis of L and R PCA; extubated 7/18 PMH- CVA in 2014, GERD, seizures, OSA, cocaine & tobacco abuse, HTN, hepatitis C  Clinical Impression  Pt admitted with above. At beginning of evaluation, pt alert and conversant. A&O to self only, but follows one step commands. Pt able to transition to edge of bed and to standing without physical assist, however, demonstrates increased left lateral lean and dynamic instability. Pt requiring moderate assist to take pivotal steps from bed to chair. Attempted to stand another trial up to RW; pt L hand slipping off of RW and pt sitting back down due to fatigue. Pt demonstrates impaired cognition, L sided proprioception, decreased coordination, functional weakness, decreased standing balance. Currently, recommend continued inpatient follow up therapy, <3 hours/day. Will continue to re-assess if cognition improves .         If plan is discharge home, recommend the following: A lot of help with walking and/or transfers;A lot of help with bathing/dressing/bathroom;Assistance with cooking/housework;Assist for transportation;Help with stairs or ramp for entrance;Supervision due to cognitive status   Can travel by private vehicle   No    Equipment Recommendations Other (comment) (TBA)  Recommendations for Other Services       Functional Status Assessment Patient has had a recent decline in their functional status and demonstrates the ability to make significant improvements in function in a reasonable and predictable amount of time.     Precautions / Restrictions Precautions Precautions:  Fall Restrictions Weight Bearing Restrictions Per Provider Order: No      Mobility  Bed Mobility Overal bed mobility: Needs Assistance Bed Mobility: Supine to Sit     Supine to sit: Contact guard          Transfers Overall transfer level: Needs assistance Equipment used: None, Rolling walker (2 wheels) Transfers: Sit to/from Stand, Bed to chair/wheelchair/BSC Sit to Stand: Contact guard assist   Step pivot transfers: Mod assist, +2 safety/equipment       General transfer comment: Close CGA to stand, some impulsivity with initially transitioning to standing before lines were ready. ModA for dynamic balance, taking pivotal steps to chair with left lateral lean. Attempted to stand additionally from chair to RW, but pt sitting back down without warning    Ambulation/Gait                  Stairs            Wheelchair Mobility     Tilt Bed    Modified Rankin (Stroke Patients Only) Modified Rankin (Stroke Patients Only) Pre-Morbid Rankin Score: No symptoms Modified Rankin: Severe disability     Balance Overall balance assessment: Needs assistance Sitting-balance support: Feet supported Sitting balance-Leahy Scale: Fair     Standing balance support: No upper extremity supported, During functional activity Standing balance-Leahy Scale: Poor                               Pertinent Vitals/Pain Pain Assessment Pain Assessment: No/denies pain    Home Living Family/patient expects to be discharged to:: Unsure  Additional Comments: Pt lives alone in an apartment in [place]    Prior Function Prior Level of Function : Independent/Modified Independent                     Extremity/Trunk Assessment   Upper Extremity Assessment Upper Extremity Assessment: Defer to OT evaluation    Lower Extremity Assessment Lower Extremity Assessment: RLE deficits/detail;LLE deficits/detail RLE Deficits / Details: MMT  scores of quadriceps 5/5, anterior tibialis 5/5 LLE Deficits / Details: MMT scores of quadriceps 5/5, anterior tibialis 5/5    Cervical / Trunk Assessment Cervical / Trunk Assessment: Normal  Communication   Communication Communication: No apparent difficulties    Cognition Arousal: Alert, Lethargic (more lethargic towards end of session) Behavior During Therapy: Flat affect   PT - Cognitive impairments: Orientation, Awareness, Memory, Attention, Problem solving, Safety/Judgement   Orientation impairments: Place, Time, Situation                   PT - Cognition Comments: Pt A&O to self only Following commands: Impaired Following commands impaired: Only follows one step commands consistently     Cueing Cueing Techniques: Verbal cues     General Comments  BP 166/94 (115), SpO2 97% on RA, HR 80    Exercises     Assessment/Plan    PT Assessment Patient needs continued PT services  PT Problem List Decreased strength;Decreased activity tolerance;Decreased balance;Decreased mobility;Decreased cognition;Decreased safety awareness       PT Treatment Interventions DME instruction;Gait training;Functional mobility training;Therapeutic activities;Therapeutic exercise;Balance training;Patient/family education    PT Goals (Current goals can be found in the Care Plan section)  Acute Rehab PT Goals Patient Stated Goal: did not state PT Goal Formulation: With patient Time For Goal Achievement: 12/26/23 Potential to Achieve Goals: Good    Frequency Min 3X/week     Co-evaluation               AM-PAC PT 6 Clicks Mobility  Outcome Measure Help needed turning from your back to your side while in a flat bed without using bedrails?: A Little Help needed moving from lying on your back to sitting on the side of a flat bed without using bedrails?: A Little Help needed moving to and from a bed to a chair (including a wheelchair)?: A Lot Help needed standing up from a  chair using your arms (e.g., wheelchair or bedside chair)?: A Little Help needed to walk in hospital room?: Total Help needed climbing 3-5 steps with a railing? : Total 6 Click Score: 13    End of Session   Activity Tolerance: Patient tolerated treatment well Patient left: in chair;with call bell/phone within reach;Other (comment) (posey chair alarm belt) Nurse Communication: Mobility status PT Visit Diagnosis: Unsteadiness on feet (R26.81);Difficulty in walking, not elsewhere classified (R26.2)    Time: 8689-8667 PT Time Calculation (min) (ACUTE ONLY): 22 min   Charges:   PT Evaluation $PT Eval Moderate Complexity: 1 Mod   PT General Charges $$ ACUTE PT VISIT: 1 Visit         Philip [name], PT, DPT Acute Rehabilitation Services Office [phone]   Philip [name] 12/12/2023, 1:46 PM

## 2023-12-13 DIAGNOSIS — F1410 Cocaine abuse, uncomplicated: Secondary | ICD-10-CM | POA: Diagnosis not present

## 2023-12-13 DIAGNOSIS — I169 Hypertensive crisis, unspecified: Secondary | ICD-10-CM | POA: Diagnosis not present

## 2023-12-13 DIAGNOSIS — G40901 Epilepsy, unspecified, not intractable, with status epilepticus: Secondary | ICD-10-CM

## 2023-12-13 DIAGNOSIS — I10 Essential (primary) hypertension: Secondary | ICD-10-CM | POA: Diagnosis not present

## 2023-12-13 DIAGNOSIS — F1490 Cocaine use, unspecified, uncomplicated: Secondary | ICD-10-CM | POA: Diagnosis not present

## 2023-12-13 DIAGNOSIS — R569 Unspecified convulsions: Secondary | ICD-10-CM | POA: Diagnosis not present

## 2023-12-13 DIAGNOSIS — I161 Hypertensive emergency: Secondary | ICD-10-CM | POA: Diagnosis not present

## 2023-12-13 LAB — GLUCOSE, CAPILLARY
Glucose-Capillary: 116 mg/dL — ABNORMAL HIGH (ref 70–99)
Glucose-Capillary: 118 mg/dL — ABNORMAL HIGH (ref 70–99)
Glucose-Capillary: 126 mg/dL — ABNORMAL HIGH (ref 70–99)
Glucose-Capillary: 135 mg/dL — ABNORMAL HIGH (ref 70–99)
Glucose-Capillary: 143 mg/dL — ABNORMAL HIGH (ref 70–99)
Glucose-Capillary: 181 mg/dL — ABNORMAL HIGH (ref 70–99)

## 2023-12-13 MED ORDER — LORAZEPAM 1 MG PO TABS
1.0000 mg | ORAL_TABLET | ORAL | Status: AC | PRN
  Administered 2023-12-14: 2 mg via ORAL
  Filled 2023-12-13: qty 2

## 2023-12-13 MED ORDER — ADULT MULTIVITAMIN W/MINERALS CH
1.0000 | ORAL_TABLET | Freq: Every day | ORAL | Status: DC
  Administered 2023-12-13 – 2023-12-17 (×5): 1 via ORAL
  Filled 2023-12-13 (×5): qty 1

## 2023-12-13 MED ORDER — LOSARTAN POTASSIUM 25 MG PO TABS
25.0000 mg | ORAL_TABLET | Freq: Every day | ORAL | Status: DC
  Administered 2023-12-13 – 2023-12-17 (×5): 25 mg via ORAL
  Filled 2023-12-13 (×5): qty 1

## 2023-12-13 MED ORDER — THIAMINE MONONITRATE 100 MG PO TABS
100.0000 mg | ORAL_TABLET | Freq: Every day | ORAL | Status: DC
  Administered 2023-12-13 – 2023-12-17 (×5): 100 mg via ORAL
  Filled 2023-12-13 (×5): qty 1

## 2023-12-13 MED ORDER — FOLIC ACID 1 MG PO TABS
1.0000 mg | ORAL_TABLET | Freq: Every day | ORAL | Status: DC
  Administered 2023-12-13 – 2023-12-17 (×5): 1 mg via ORAL
  Filled 2023-12-13 (×5): qty 1

## 2023-12-13 MED ORDER — THIAMINE HCL 100 MG/ML IJ SOLN
100.0000 mg | Freq: Every day | INTRAMUSCULAR | Status: DC
  Filled 2023-12-13: qty 2

## 2023-12-13 MED ORDER — LORAZEPAM 2 MG/ML IJ SOLN
1.0000 mg | INTRAMUSCULAR | Status: AC | PRN
  Administered 2023-12-13: 1 mg via INTRAVENOUS
  Filled 2023-12-13: qty 1

## 2023-12-13 MED ORDER — LORAZEPAM 2 MG/ML IJ SOLN
1.0000 mg | Freq: Once | INTRAMUSCULAR | Status: AC
  Administered 2023-12-13: 1 mg via INTRAVENOUS
  Filled 2023-12-13: qty 1

## 2023-12-13 NOTE — TOC Initial Note (Signed)
 Transition of Care [facility]) - Initial/Assessment Note    Patient Details  Name: Philip [name] MRN: [id number] Date of Birth: 1956-04-02  Transition of Care [facility]) CM/SW Contact:    Cena Ligas, LCSW Phone Number: 12/13/2023, 4:04 PM  Clinical Narrative:                  CSW received SNF consult. CSW spoke to members sister [name]. CSW introduced self and explained role at the hospital. [name] reports that PTA he lives on his own and is mobile and preforms all of his ADL's [name] is his payee and organizes his medications.   CSW reviewed PT/OT recommendations for SNF. [name] reports SNF will be beneficial to member but states he may refuse to go. [name] gave CSW permission to fax out to facilities in the area. [name] will prefer member to go to a SNF closer to his home in [place].  CSW gave pt medicare.gov rating list to review. CSW explained insurance auth process.  CSW will continue to follow.   Expected Discharge Plan: Skilled Nursing Facility Barriers to Discharge: Continued Medical Work up   Patient Goals and CMS Choice Patient states their goals for this hospitalization and ongoing recovery are:: Patients wishes unknown   Choice offered to / list presented to : Sibling      Expected Discharge Plan and Services       Living arrangements for the past 2 months: Single Family Home                                      Prior Living Arrangements/Services Living arrangements for the past 2 months: Single Family Home Lives with:: Self Patient language and need for interpreter reviewed:: Yes Do you feel safe going back to the place where you live?: Yes      Need for Family Participation in Patient Care: Yes (Comment) Care giver support system in place?: Yes (comment)   Criminal Activity/Legal Involvement Pertinent to Current Situation/Hospitalization: No - Comment as needed  Activities of Daily Living   ADL Screening (condition at time of  admission) Independently performs ADLs?: Yes (appropriate for developmental age) Is the patient deaf or have difficulty hearing?: No Does the patient have difficulty seeing, even when wearing glasses/contacts?: Yes Does the patient have difficulty concentrating, remembering, or making decisions?: Yes  Permission Sought/Granted Permission sought to share information with : Family Supports, Oceanographer granted to share information with : Yes, Verbal Permission Granted     Permission granted to share info w AGENCY: SNF        Emotional Assessment Appearance:: Appears stated age Attitude/Demeanor/Rapport: Unable to Assess Affect (typically observed): Unable to Assess Orientation: : Oriented to Self, Oriented to  Time, Oriented to Situation Alcohol / Substance Use: Not Applicable Psych Involvement: No (comment)  Admission diagnosis:  Hypertensive crisis [I16.9] Status epilepticus (HCC) [G40.901] Cocaine abuse (HCC) [F14.10] Hypertensive emergency [I16.1] Seizure [facility]) [R56.9] Patient Active Problem List   Diagnosis Date Noted   Hypertensive crisis 12/10/2023   Seizure (HCC) 12/10/2023   Calcaneal spur of left foot 04/29/2021   Right leg weakness 03/10/2021   PUD (peptic ulcer disease) 03/11/2016   Alcohol abuse, 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   Hepatic cirrhosis (HCC) 06/28/2015   Proteinuria 06/07/2014   Tobacco user    Hyperlipidemia 12/13/2012   OSA (obstructive sleep apnea) 11/03/2012   HTN (hypertension) 09/16/2012   History of  seizure disorder, since CVA 09/16/2012   Asthma 09/16/2012   PCP:  Zollie Lowers, MD Pharmacy:   [facility] [address] [place] - [address] [phone] [place] [address] Phone: [phone] Fax: [phone]  [place] [facility] [address] [place] [address] Phone: [phone] Fax: [phone]     Social Drivers of Health (SDOH) Social  History: SDOH Screenings   Food Insecurity: No Food Insecurity (12/07/2023)  Housing: Unknown (12/07/2023)  Transportation Needs: No Transportation Needs (12/07/2023)  Utilities: Not At Risk (12/07/2023)  Alcohol Screen: Low Risk  (12/07/2023)  Depression (PHQ2-9): Low Risk  (12/07/2023)  Financial Resource Strain: Low Risk  (12/07/2023)  Physical Activity: Insufficiently Active (12/07/2023)  Social Connections: Socially Isolated (12/07/2023)  Stress: No Stress Concern Present (12/07/2023)  Tobacco Use: High Risk (12/11/2023)  Health Literacy: Adequate Health Literacy (12/07/2023)   SDOH Interventions:     Readmission Risk Interventions     No data to display

## 2023-12-13 NOTE — Progress Notes (Signed)
 [facility] Progress Note     Subjective: CC:  No complaints. Says he lives alone and likes it that way. Says he is going home tomorrow.   WBC 7.5 from 12.6 on the day of his scalp I&D Objective: Vital signs in last 24 hours: Temp:  [97.4 F (36.3 C)-99.1 F (37.3 C)] 98.2 F (36.8 C) (07/20 0754) Pulse Rate:  [51-91] 86 (07/20 0754) Resp:  [14-21] 17 (07/20 0754) BP: (132-197)/(88-115) 176/115 (07/20 0754) SpO2:  [94 %-100 %] 100 % (07/20 0754) Weight:  [73.8 kg] 73.8 kg (07/20 0423) Last BM Date : 12/10/23  Intake/Output from previous day: 07/19 0701 - 07/20 0700 In: 881.2 [P.O.:240; I.V.:386.3; IV Piggyback:254.9] Out: 350 [Urine:350] Intake/Output this shift: No intake/output data recorded.  PE: Gen:  Alert, NAD, pleasant HEENT: scalp abscess with packed removed - re-packed with 2x2 gauze.  Pulm:  Normal effort Abd: Soft, non-tender Skin: warm and dry, no rashes  Psych: A&Ox3   Lab Results:  Recent Labs    12/11/23 0423 12/12/23 0518  WBC 12.6* 7.5  HGB 17.1* 15.3  HCT 50.1 45.9  PLT 161 123*   BMET Recent Labs    12/11/23 0423 12/12/23 0518  NA 137 137  K 3.8 3.8  CL 100 100  CO2 24 24  GLUCOSE 156* 101*  BUN 9 10  CREATININE 1.52* 1.18  CALCIUM  8.8* 8.5*   PT/INR No results for input(s): LABPROT, INR in the last 72 hours. CMP     Component Value Date/Time   NA 137 12/12/2023 0518   NA 140 11/17/2022 1442   K 3.8 12/12/2023 0518   CL 100 12/12/2023 0518   CO2 24 12/12/2023 0518   GLUCOSE 101 (H) 12/12/2023 0518   BUN 10 12/12/2023 0518   BUN 6 (L) 11/17/2022 1442   CREATININE 1.18 12/12/2023 0518   CREATININE 1.38 (H) 11/06/2017 1004   CALCIUM  8.5 (L) 12/12/2023 0518   CALCIUM  8.6 (L) 06/08/2014 0726   PROT 8.1 12/10/2023 0810   PROT 7.4 11/17/2022 1442   ALBUMIN 3.3 (L) 12/10/2023 0810   ALBUMIN 4.1 11/17/2022 1442   AST 40 12/10/2023 0810   ALT 15 12/10/2023 0810   ALKPHOS 126 12/10/2023 0810   BILITOT 0.9  12/10/2023 0810   BILITOT 0.6 11/17/2022 1442   GFRNONAA >60 12/12/2023 0518   GFRNONAA 73 12/13/2012 1257   GFRAA 58 (L) 01/09/2020 1636   GFRAA 84 12/13/2012 1257   Lipase     Component Value Date/Time   LIPASE 36 06/16/2019 1340       Studies/Results: ECHOCARDIOGRAM COMPLETE Result Date: 12/11/2023    ECHOCARDIOGRAM REPORT   Patient Name:   Philip [name] Date of Exam: 12/11/2023 Medical Rec #:  [id number]    Height:       67.0 in Accession #:    [id number]   Weight:       156.1 lb Date of Birth:  1955-08-10    BSA:          1.820 m Patient Age:    68 years     BP:           134/84 mmHg Patient Gender: M            HR:           44 bpm. Exam Location:  Inpatient Procedure: 2D Echo, Color Doppler and Cardiac Doppler (Both Spectral and Color            Flow Doppler were  utilized during procedure). Indications:    Stroke i63.9  History:        Patient has prior history of Echocardiogram examinations, most                 recent 03/11/2021. Risk Factors:Hypertension, Dyslipidemia,                 Sleep Apnea, ETOH and Diabetes.  Sonographer:    Damien Senior RDCS Referring Phys: [id number] LEITA SHAUNNA GASKINS  Sonographer Comments: Scanned supine on artificial respirator. IMPRESSIONS  1. Left ventricular ejection fraction, by estimation, is 60 to 65%. The left ventricle has normal function. The left ventricle has no regional wall motion abnormalities. There is moderate asymmetric left ventricular hypertrophy. Left ventricular diastolic parameters were normal.  2. Right ventricular systolic function is normal. The right ventricular size is mildly enlarged.  3. The mitral valve is normal in structure. Trivial mitral valve regurgitation.  4. The aortic valve is tricuspid. Aortic valve regurgitation is not visualized.  5. The inferior vena cava is normal in size with greater than 50% respiratory variability, suggesting right atrial pressure of 3 mmHg. FINDINGS  Left Ventricle: Left ventricular ejection fraction, by  estimation, is 60 to 65%. The left ventricle has normal function. The left ventricle has no regional wall motion abnormalities. The left ventricular internal cavity size was normal in size. There is  moderate asymmetric left ventricular hypertrophy. Left ventricular diastolic parameters were normal. Right Ventricle: The right ventricular size is mildly enlarged. Right vetricular wall thickness was not assessed. Right ventricular systolic function is normal. Left Atrium: Left atrial size was normal in size. Right Atrium: Right atrial size was normal in size. Pericardium: There is no evidence of pericardial effusion. Mitral Valve: The mitral valve is normal in structure. Trivial mitral valve regurgitation. Tricuspid Valve: The tricuspid valve is normal in structure. Tricuspid valve regurgitation is mild. Aortic Valve: The aortic valve is tricuspid. Aortic valve regurgitation is not visualized. Pulmonic Valve: The pulmonic valve was normal in structure. Pulmonic valve regurgitation is not visualized. Aorta: The aortic root and ascending aorta are structurally normal, with no evidence of dilitation. Venous: The inferior vena cava is normal in size with greater than 50% respiratory variability, suggesting right atrial pressure of 3 mmHg. IAS/Shunts: No atrial level shunt detected by color flow Doppler.  LEFT VENTRICLE PLAX 2D LVIDd:         3.90 cm   Diastology LVIDs:         2.40 cm   LV e' medial:    7.30 cm/s LV PW:         1.00 cm   LV E/e' medial:  10.6 LV IVS:        1.65 cm   LV e' lateral:   10.90 cm/s LVOT diam:     1.90 cm   LV E/e' lateral: 7.1 LV SV:         48 LV SV Index:   26 LVOT Area:     2.84 cm  RIGHT VENTRICLE RV S prime:     12.30 cm/s TAPSE (M-mode): 2.2 cm LEFT ATRIUM             Index        RIGHT ATRIUM           Index LA diam:        2.80 cm 1.54 cm/m   RA Area:     19.00 cm LA Vol (A2C):   26.4 ml 14.51 ml/m  RA Volume:   53.20 ml  29.23 ml/m LA Vol (A4C):   56.8 ml 31.21 ml/m LA Biplane  Vol: 42.1 ml 23.13 ml/m  AORTIC VALVE LVOT Vmax:   78.30 cm/s LVOT Vmean:  50.000 cm/s LVOT VTI:    0.169 m  AORTA Ao Root diam: 3.10 cm Ao Asc diam:  3.80 cm MITRAL VALVE               TRICUSPID VALVE MV Area (PHT): 3.77 cm    TR Peak grad:   20.8 mmHg MV Decel Time: 201 msec    TR Vmax:        228.00 cm/s MV E velocity: 77.10 cm/s MV A velocity: 70.50 cm/s  SHUNTS MV E/A ratio:  1.09        Systemic VTI:  0.17 m                            Systemic Diam: 1.90 cm Vina Gull MD Electronically signed by Vina Gull MD Signature Date/Time: 12/11/2023/2:50:56 PM    Final    MR BRAIN W WO CONTRAST Result Date: 12/11/2023 CLINICAL DATA:  Seizure, new-onset, no history of trauma EXAM: MRI HEAD WITHOUT AND WITH CONTRAST TECHNIQUE: Multiplanar, multiecho pulse sequences of the brain and surrounding structures were obtained without and with intravenous contrast. CONTRAST:  7.5mL GADAVIST  GADOBUTROL  1 MMOL/ML IV SOLN COMPARISON:  CT the head dated December 10, 2023 and MRI of the head dated March 11, 2021. FINDINGS: Brain: There is generalized cerebral atrophy present and there is moderately advanced periventricular and deep cerebral white matter disease. There are patchy areas of increased T2 signal within the pons. There are focal areas of blooming artifact present within the thalami bilaterally. The lesion on the right has developed in the interim. There is no restricted diffusion to indicate acute or recent infarction. There is no abnormal parenchymal or meningeal enhancement. Vascular: Normal vascular flow voids. The venous sinuses appear to be patent. Skull and upper cervical spine: Normal marrow signal. No osseous lesions. There is a peripherally enhancing fluid collection within the subcutaneous fat of the occipital scalp just to the right of midline, measuring approximately 3.6 x 1.9 x 2.0 cm. Sinuses/Orbits: Clear paranasal sinuses. Status post bilateral lens surgery. Other: None. IMPRESSION: 1. There are focal  areas of hemosiderin staining present within the thalami bilaterally, with the lesion on the right having developed in the interim. 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 2. 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 3. Peripherally enhancing fluid collection in the right posterior scalp, which could represent a seroma or abscess. Recommend clinical correlation. Electronically Signed   By: Evalene Coho M.D.   On: 12/11/2023 10:34   Overnight EEG with video Result Date: 12/11/2023 Shelton Arlin KIDD, MD     12/12/2023  6:51 AM Patient Name: Philip [name] MRN: [id number] Epilepsy Attending: Arlin KIDD Shelton Referring Physician/Provider: Remi Pippin, NP Duration: 12/10/2023 1534 to 12/11/2023 1723 Patient history: 68 y.o. male with hx of HTN, Dm, CVA, HTN, Seizures, and GERD who initially presented to [facility] ED via EMS who were initially called out for left side weakness and slurred speech, however per EMS when they moved him to their stretcher he began having tonic clonic seizure like activity with decorticate posturing. EEG to evaluate for seizure Level of alertness: Awake, asleep AEDs during EEG study: LEV, Versed , Propofol  Technical aspects: This EEG study was done with scalp electrodes positioned according to  the 10-20 International system of electrode placement. Electrical activity was reviewed with band pass filter of 1-70Hz , sensitivity of 7 uV/mm, display speed of 74mm/sec with a 60Hz  notched filter applied as appropriate. EEG data were recorded continuously and digitally stored.  Video monitoring was available and reviewed as appropriate. Description: EEG initially showed continuous generalized 3 to 6 Hz theta-delta slowing with overriding 13 to 15 Hz beta activity. Gradually as sedation was weaned, EEG improved and showed The posterior dominant rhythm consists of 9-10 Hz activity of moderate voltage (25-35 uV) seen predominantly in posterior head regions, symmetric and reactive to eye  opening and eye closing. Sleep was characterized by vertex waves, sleep spindles (12 to 14 Hz), maximal frontocentral region. Intermittent generalized 3-6hz  theta-delta slowing was noted. Hyperventilation and photic stimulation were not performed.   EEG was disconnected on 12/11/2023 between 0812 to 0942 for mri brain. After 1723, EEG ws not interpretable due to significant electrode artifact. ABNORMALITY - Continuous slow, generalized IMPRESSION: This study was initially suggestive of moderate to severe diffuse encephalopathy, likely related to sedation. Gradually as sedation was weaned, EEG improved and was suggestive of mild to moderate diffuse encephalopathy. No seizures or epileptiform discharges were seen throughout the recording. Priyanka O Yadav    Anti-infectives: Anti-infectives (From admission, onward)    Start     Dose/Rate Route Frequency Ordered Stop   12/11/23 1500  vancomycin  (VANCOREADY) IVPB 1250 mg/250 mL        1,250 mg 166.7 mL/hr over 90 Minutes Intravenous Every 24 hours 12/10/23 1449 12/15/23 2359   12/10/23 1530  vancomycin  (VANCOREADY) IVPB 1500 mg/300 mL        1,500 mg 150 mL/hr over 120 Minutes Intravenous  Once 12/10/23 1440 12/10/23 1757        Assessment/Plan  Posterior scalp abscess - s/p bedside I&D and iodoform packing 7/18; Cx w/ staph; currently on vancomycin . Plan to continue daily packing/wicking with 1/4 iodoform while in the hospital but can stop packing on the day of discharge and shower daily with soap and water. Abx per primary service. CCS will sign off. No follow up required. Call as needed.   FEN - per primary VTE - heparin  ID - abscess culture with GPCs in pairs and chains, S&S P, empiric vanc started 7/18   CVA Seizure disorder HTN HLD DM Hep C PSA   LOS: 3 days   I reviewed nursing notes, hospitalist notes, last 24 h vitals and pain scores, last 48 h intake and output, last 24 h labs and trends, and last 24 h imaging results.  This  care required straight-forward level of medical decision making.   Almarie Pringle, PA-C [facility] Please see Amion for pager number during day hours 7:00am-4:30pm

## 2023-12-13 NOTE — Plan of Care (Signed)
   Problem: Education: Goal: Knowledge of General Education information will improve Description Including pain rating scale, medication(s)/side effects and non-pharmacologic comfort measures Outcome: Progressing   Problem: Health Behavior/Discharge Planning: Goal: Ability to manage health-related needs will improve Outcome: Progressing

## 2023-12-13 NOTE — Progress Notes (Signed)
 Subjective:   Patient has been extubated and moved out of the ICU.  He is oriented to person but states he is in a doctor's office and that he does not remember the date.  He has had no further seizure activity.  Examination  Vital signs in last 24 hours: Temp:  [97.4 F (36.3 C)-99.1 F (37.3 C)] 98.2 F (36.8 C) (07/20 0754) Pulse Rate:  [51-91] 86 (07/20 0754) Resp:  [14-21] 17 (07/20 0754) BP: (132-197)/(88-115) 176/115 (07/20 0754) SpO2:  [94 %-100 %] 100 % (07/20 0754) Weight:  [73.8 kg] 73.8 kg (07/20 0423)  General: Well-nourished, well-developed patient in no acute distress  NEURO:  Mental Status: Oriented to person, states he is in a doctor's office and that he does not remember the month and year Speech/Language: speech is without dysarthria or aphasia.    Cranial Nerves:  II: PERRL.   III, IV, VI: EOMI. Eyelids elevate symmetrically.  V: Sensation is intact to light touch and symmetrical to face.  VII: Smile is symmetrical.  VIII: hearing intact to voice. IX, X: Phonation is normal.  XII: tongue is midline without fasciculations. Motor: Able to move all 4 extremities with good antigravity strength Tone: is normal and bulk is normal Sensation- Intact to light touch bilaterally.  Coordination: FTN intact bilaterally Gait-steady without assistance   Basic Metabolic Panel: Recent Labs  Lab 12/10/23 0810 12/10/23 1510 12/11/23 0423 12/12/23 0518  NA 137  --  137 137  K 4.1  --  3.8 3.8  CL 98  --  100 100  CO2 19*  --  24 24  GLUCOSE 168*  --  156* 101*  BUN 6*  --  9 10  CREATININE 1.28* 1.40* 1.52* 1.18  CALCIUM  8.8*  --  8.8* 8.5*  MG  --   --  2.1 2.1  PHOS  --   --  3.2 2.9    CBC: Recent Labs  Lab 12/10/23 0810 12/10/23 1510 12/11/23 0423 12/12/23 0518  WBC 8.6 9.2 12.6* 7.5  NEUTROABS 5.1  --   --   --   HGB 17.9* 18.9* 17.1* 15.3  HCT 54.4* 55.3* 50.1 45.9  MCV 97.7 93.9 94.2 94.6  PLT 120* 129* 161 123*     Coagulation  Studies: No results for input(s): LABPROT, INR in the last 72 hours.   Imaging personally reviewed  MRI brain with and without contrast 12/11/2023:There are focal areas of hemosiderin staining present within the thalami bilaterally, with the lesion on the right having developed in the interim. 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 Peripherally enhancing fluid collection in the right posterior scalp, which could represent a seroma or abscess. Recommend clinical correlation.  ASSESSMENT AND PLAN: Philip [name] is a 68 y.o. male with hx of HTN, Dm, CVA, HTN, Seizures, and GERD who initially presented to [facility] ED via EMS who were initially called out for left side weakness and slurred speech, however per EMS when they moved him to their stretcher he began having tonic clonic seizure like activity with decorticate posturing. Received 5mg  midazolam  by EMS with cessation of seizure activity. Intubated at [facility] ED and transferred to [facility]. He was loaded with keppra  4g prior to transfer to [facility].  Highest BP 227/138 with an episode of hypotension SBP 80s with transport.  Patient has since been extubated, and LTM EEG has been discontinued as no seizure activity was seen.  He has been transferred out of the  ICU, is doing well and hopes to go home soon   Hypertensive emergency, improving Seizures, likely provoked Cocaine use disorder -Patient likely had provoked seizures in the setting of cocaine use, hypertensive emergency.  However did have left-sided weakness and slurred speech and therefore because of the focal nature as well as prior history of seizures, would recommend patient stay on antiseizure medications - Discussed need to remain on AEDs and abstain from cocaine use with patient.  He is in agreement with this plan, but given that he is not fully alert and oriented, would reinforce this with family.  Recommendations -Patient is already  on gabapentin  600 mg nightly.  Unclear if he was taking the whole dose at home.  Therefore, for now we will resume 300 mg nightly.  Depending on how he tolerates this dose, can increase to home dose of 600 mg nightly gradually - Resume Topamax  50 mg twice daily.  Of note both Topamax  and gabapentin  are also antiseizure medications - For now we will continue Keppra  500 mg twice daily however this can be eventually discontinued - Goal blood pressure: normotension - Discussed cocaine cessation with patient, who appears to be in agreement with this and placed order for [facility] for more cessation counseling - Neurology will sign off, please call us  with questions or concerns.  Patient seen by NP and discussed with attending MD, who agrees with the plan. Darriel Sinquefield E Everitt Clint Kill , MSN, AGACNP-BC [facility] See Amion for schedule and pager information 12/13/2023 8:28 AM

## 2023-12-13 NOTE — Progress Notes (Signed)
 [facility]                                                                              Philip [name], is a 68 y.o. male, DOB - [birth year], FMW:[id number] Admit date - 12/10/2023    Outpatient Primary MD for the patient is Zollie Lowers, MD  LOS - 3  days  Chief Complaint  Patient presents with   Seizures   Code Stroke       Brief summary   Patient is a 68 year old male with uncontrolled HTN, DM, GERD, OSA, prior CVA presented to [facility] ER on 7/17 with acute onset left-sided weakness.  He called [phone].  This resolved with 5 mg midazolam  was obtunded on arrival to ER and was intubated. Patient's BP was 250/130, improved with labetalol  and propofol .  He was given Keppra  and neurology was also consulted for EEG.  Patient was admitted to ICU by PCCM.  Patient was transferred to the floor and [facility] assumed care on 12/13/2023  Significant Hospital Events:   7/17 ADMITTED. Hypertensive crisis, seizure, [facility] & MRI fine, weaned off propofol , extubated. Scalp abscess lanced.   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  No new strokes. - Patient has been seen by neurology, recommended cocaine cessation, BP control - he has been weaned off of Precedex , currently alert and oriented - Goal BP less than 160 - BP still elevated, continue hydralazine  50 mg 3 times daily, amlodipine  10 mg daily.  Beta-blocker has been discontinued due to cocaine. - Has proteinuria, creatinine improved, will start low-dose losartan  25 mg daily  Witnessed seizures, likely due to hypertensive emergency - EEG 7/18 showed moderate to severe diffuse encephalopathy.  No seizure or  epileptiform discharges seen - Per neurology, likely had provoked seizures in the setting of cocaine use, hypertensive emergency however given prior history of seizures, left-sided weakness, recommended patient stay on antiseizure medications - Resumed gabapentin , Topamax  50 mg twice daily, continue Keppra  500 mg twice daily - Outpatient follow-up with neurology  Acute metabolic encephalopathy - Likely due to #1 and #2, cocaine abuse - Mental status now improving, continue current management     Acute hypoxic respiratory failure with hypoxia requiring mechanical ventilation due to seizure, likely aspiration - extubated 7/18 -Pulmonary hygiene - Currently stable, O2 sats 98% on room air    AKI  resolved; no longer appears to have baseline CKD - Creatinine 1.5 on 7/18, improved   Hyperglycemia; no evidence of DM - Hemoglobin A1c 5.1 on 12/10/2023    Skin abscess posterior R scalp  -Continue vancomycin  - General Surgery following cultures showing abundant Staph aureus, sensitivities pending  Estimated body mass index is 25.48 kg/m as calculated from the following:   Height as of this encounter: 5' 7.01 (1.702 m).   Weight as of this encounter: 73.8 kg.  Code Status: Full code DVT  Prophylaxis:  heparin  injection 5,000 Units Start: 12/10/23 1530   Level of Care: Level of care: Telemetry Medical Family Communication: Updated patient Disposition Plan:      Remains inpatient appropriate:   PT recommended SNF   Procedures:  Mechanical ventilation EEG  Consultants:   Neurology Pulmonary critical care  Antimicrobials:   Anti-infectives (From admission, onward)    Start     Dose/Rate Route Frequency Ordered Stop   12/11/23 1500  vancomycin  (VANCOREADY) IVPB 1250 mg/250 mL        1,250 mg 166.7 mL/hr over 90 Minutes Intravenous Every 24 hours 12/10/23 1449 12/15/23 2359   12/10/23 1530  vancomycin  (VANCOREADY) IVPB 1500 mg/300 mL        1,500 mg 150 mL/hr over 120 Minutes  Intravenous  Once 12/10/23 1440 12/10/23 1757          Medications  amLODipine   10 mg Oral Daily   aspirin   81 mg Oral Daily   Chlorhexidine  Gluconate Cloth  6 each Topical Daily   docusate  100 mg Oral BID   feeding supplement  1 Container Oral TID BM   gabapentin   300 mg Oral QHS   heparin   5,000 Units Subcutaneous Q8H   hydrALAZINE   50 mg Oral TID   insulin  aspart  0-15 Units Subcutaneous Q4H   levETIRAcetam   500 mg Intravenous BID   nicotine   21 mg Transdermal Daily   pantoprazole  (PROTONIX ) IV  40 mg Intravenous Q24H   topiramate   50 mg Oral BID      Subjective:   Philip [name] was seen and examined today.  Ambulating, tolerating diet without difficulty.  BP still elevated.  No headache, chest pain, shortness of, palpitations, fevers or chills.  Overnight no acute issues. Objective:   Vitals:   12/13/23 0423 12/13/23 0427 12/13/23 0754 12/13/23 0915  BP:  (!) 161/99 (!) 176/115 (!) 173/99  Pulse:  72 86 88  Resp:  17 17 18   Temp:  98.2 F (36.8 C) 98.2 F (36.8 C) 98.2 F (36.8 C)  TempSrc:  Oral Oral Oral  SpO2:  100% 100% 100%  Weight: 73.8 kg     Height:        Intake/Output Summary (Last 24 hours) at 12/13/2023 0947 Last data filed at 12/12/2023 2240 Gross per 24 hour  Intake 724.1 ml  Output 350 ml  Net 374.1 ml     Wt Readings from Last 3 Encounters:  12/13/23 73.8 kg  12/07/23 76.7 kg  09/25/23 76.9 kg     Exam General: Alert and oriented x 3, NAD Cardiovascular: S1 S2 auscultated,  RRR Respiratory: Clear to auscultation bilaterally, no wheezing Gastrointestinal: Soft, nontender, nondistended, + bowel sounds Ext: no pedal edema bilaterally Neuro: Strength 5/5 upper and lower extremities bilaterally Skin: Dressing intact on the scalp  Psych: Normal affect     Data Reviewed:  I have personally reviewed following labs    CBC Lab Results  Component Value Date   WBC 7.5 12/12/2023   RBC 4.85 12/12/2023   HGB 15.3 12/12/2023   HCT  45.9 12/12/2023   MCV 94.6 12/12/2023   MCH 31.5 12/12/2023   PLT 123 (L) 12/12/2023   MCHC 33.3 12/12/2023   RDW 13.5 12/12/2023   LYMPHSABS 1.3 12/10/2023   MONOABS 0.9 12/10/2023   EOSABS 1.2 (H) 12/10/2023   BASOSABS 0.1 12/10/2023     Last metabolic panel Lab Results  Component Value Date   NA 137 12/12/2023   K 3.8 12/12/2023  CL 100 12/12/2023   CO2 24 12/12/2023   BUN 10 12/12/2023   CREATININE 1.18 12/12/2023   GLUCOSE 101 (H) 12/12/2023   GFRNONAA >60 12/12/2023   GFRAA 58 (L) 01/09/2020   CALCIUM  8.5 (L) 12/12/2023   PHOS 2.9 12/12/2023   PROT 8.1 12/10/2023   ALBUMIN 3.3 (L) 12/10/2023   LABGLOB 3.3 11/17/2022   AGRATIO 1.6 10/24/2021   BILITOT 0.9 12/10/2023   ALKPHOS 126 12/10/2023   AST 40 12/10/2023   ALT 15 12/10/2023   ANIONGAP 13 12/12/2023    CBG (last 3)  Recent Labs    12/12/23 2353 12/13/23 0426 12/13/23 0752  GLUCAP 108* 118* 135*      Coagulation Profile: Recent Labs  Lab 12/10/23 0810  INR 1.0     Radiology Studies: I have personally reviewed the imaging studies  ECHOCARDIOGRAM COMPLETE Result Date: 12/11/2023    ECHOCARDIOGRAM REPORT   Patient Name:   TOBYN OSGOOD Date of Exam: 12/11/2023 Medical Rec #:  [id number]    Height:       67.0 in Accession #:    [id number]   Weight:       156.1 lb Date of Birth:  Dec 18, 1955    BSA:          1.820 m Patient Age:    68 years     BP:           134/84 mmHg Patient Gender: M            HR:           44 bpm. Exam Location:  Inpatient Procedure: 2D Echo, Color Doppler and Cardiac Doppler (Both Spectral and Color            Flow Doppler were utilized during procedure). Indications:    Stroke i63.9  History:        Patient has prior history of Echocardiogram examinations, most                 recent 03/11/2021. Risk Factors:Hypertension, Dyslipidemia,                 Sleep Apnea, ETOH and Diabetes.  Sonographer:    Damien Senior RDCS Referring Phys: [id number] LEITA SHAUNNA GASKINS  Sonographer Comments:  Scanned supine on artificial respirator. IMPRESSIONS  1. Left ventricular ejection fraction, by estimation, is 60 to 65%. The left ventricle has normal function. The left ventricle has no regional wall motion abnormalities. There is moderate asymmetric left ventricular hypertrophy. Left ventricular diastolic parameters were normal.  2. Right ventricular systolic function is normal. The right ventricular size is mildly enlarged.  3. The mitral valve is normal in structure. Trivial mitral valve regurgitation.  4. The aortic valve is tricuspid. Aortic valve regurgitation is not visualized.  5. The inferior vena cava is normal in size with greater than 50% respiratory variability, suggesting right atrial pressure of 3 mmHg. FINDINGS  Left Ventricle: Left ventricular ejection fraction, by estimation, is 60 to 65%. The left ventricle has normal function. The left ventricle has no regional wall motion abnormalities. The left ventricular internal cavity size was normal in size. There is  moderate asymmetric left ventricular hypertrophy. Left ventricular diastolic parameters were normal. Right Ventricle: The right ventricular size is mildly enlarged. Right vetricular wall thickness was not assessed. Right ventricular systolic function is normal. Left Atrium: Left atrial size was normal in size. Right Atrium: Right atrial size was normal in size. Pericardium: There is no evidence of pericardial effusion.  Mitral Valve: The mitral valve is normal in structure. Trivial mitral valve regurgitation. Tricuspid Valve: The tricuspid valve is normal in structure. Tricuspid valve regurgitation is mild. Aortic Valve: The aortic valve is tricuspid. Aortic valve regurgitation is not visualized. Pulmonic Valve: The pulmonic valve was normal in structure. Pulmonic valve regurgitation is not visualized. Aorta: The aortic root and ascending aorta are structurally normal, with no evidence of dilitation. Venous: The inferior vena cava is normal  in size with greater than 50% respiratory variability, suggesting right atrial pressure of 3 mmHg. IAS/Shunts: No atrial level shunt detected by color flow Doppler.  LEFT VENTRICLE PLAX 2D LVIDd:         3.90 cm   Diastology LVIDs:         2.40 cm   LV e' medial:    7.30 cm/s LV PW:         1.00 cm   LV E/e' medial:  10.6 LV IVS:        1.65 cm   LV e' lateral:   10.90 cm/s LVOT diam:     1.90 cm   LV E/e' lateral: 7.1 LV SV:         48 LV SV Index:   26 LVOT Area:     2.84 cm  RIGHT VENTRICLE RV S prime:     12.30 cm/s TAPSE (M-mode): 2.2 cm LEFT ATRIUM             Index        RIGHT ATRIUM           Index LA diam:        2.80 cm 1.54 cm/m   RA Area:     19.00 cm LA Vol (A2C):   26.4 ml 14.51 ml/m  RA Volume:   53.20 ml  29.23 ml/m LA Vol (A4C):   56.8 ml 31.21 ml/m LA Biplane Vol: 42.1 ml 23.13 ml/m  AORTIC VALVE LVOT Vmax:   78.30 cm/s LVOT Vmean:  50.000 cm/s LVOT VTI:    0.169 m  AORTA Ao Root diam: 3.10 cm Ao Asc diam:  3.80 cm MITRAL VALVE               TRICUSPID VALVE MV Area (PHT): 3.77 cm    TR Peak grad:   20.8 mmHg MV Decel Time: 201 msec    TR Vmax:        228.00 cm/s MV E velocity: 77.10 cm/s MV A velocity: 70.50 cm/s  SHUNTS MV E/A ratio:  1.09        Systemic VTI:  0.17 m                            Systemic Diam: 1.90 cm Vina Gull MD Electronically signed by Vina Gull MD Signature Date/Time: 12/11/2023/2:50:56 PM    Final    Overnight EEG with video Result Date: 12/11/2023 Shelton Arlin KIDD, MD     12/12/2023  6:51 AM Patient Name: RICARDO SCHUBACH MRN: [id number] Epilepsy Attending: Arlin KIDD Shelton Referring Physician/Provider: Remi Pippin, NP Duration: 12/10/2023 1534 to 12/11/2023 1723 Patient history: 68 y.o. male with hx of HTN, Dm, CVA, HTN, Seizures, and GERD who initially presented to [facility] ED via EMS who were initially called out for left side weakness and slurred speech, however per EMS when they moved him to their stretcher he began having tonic clonic seizure like activity with  decorticate posturing. EEG to evaluate for seizure  Level of alertness: Awake, asleep AEDs during EEG study: LEV, Versed , Propofol  Technical aspects: This EEG study was done with scalp electrodes positioned according to the 10-20 International system of electrode placement. Electrical activity was reviewed with band pass filter of 1-70Hz , sensitivity of 7 uV/mm, display speed of 55mm/sec with a 60Hz  notched filter applied as appropriate. EEG data were recorded continuously and digitally stored.  Video monitoring was available and reviewed as appropriate. Description: EEG initially showed continuous generalized 3 to 6 Hz theta-delta slowing with overriding 13 to 15 Hz beta activity. Gradually as sedation was weaned, EEG improved and showed The posterior dominant rhythm consists of 9-10 Hz activity of moderate voltage (25-35 uV) seen predominantly in posterior head regions, symmetric and reactive to eye opening and eye closing. Sleep was characterized by vertex waves, sleep spindles (12 to 14 Hz), maximal frontocentral region. Intermittent generalized 3-6hz  theta-delta slowing was noted. Hyperventilation and photic stimulation were not performed.   EEG was disconnected on 12/11/2023 between 0812 to 0942 for mri brain. After 1723, EEG ws not interpretable due to significant electrode artifact. ABNORMALITY - Continuous slow, generalized IMPRESSION: This study was initially suggestive of moderate to severe diffuse encephalopathy, likely related to sedation. Gradually as sedation was weaned, EEG improved and was suggestive of mild to moderate diffuse encephalopathy. No seizures or epileptiform discharges were seen throughout the recording. Priyanka O Yadav       Lyndsey Demos M.D. [facility] 12/13/2023, 9:47 AM  Available via Epic secure chat 7am-7pm After 7 pm, please refer to night coverage provider listed on amion.

## 2023-12-13 NOTE — Plan of Care (Signed)
  Problem: Education: Goal: Knowledge of General Education information will improve Description: Including pain rating scale, medication(s)/side effects and non-pharmacologic comfort measures Outcome: Progressing   Problem: Health Behavior/Discharge Planning: Goal: Ability to manage health-related needs will improve Outcome: Progressing   Problem: Clinical Measurements: Goal: Ability to maintain clinical measurements within normal limits will improve Outcome: Progressing Goal: Will remain free from infection Outcome: Progressing Goal: Diagnostic test results will improve Outcome: Progressing Goal: Respiratory complications will improve Outcome: Progressing Goal: Cardiovascular complication will be avoided Outcome: Progressing   Problem: Activity: Goal: Risk for activity intolerance will decrease Outcome: Progressing   Problem: Nutrition: Goal: Adequate nutrition will be maintained Outcome: Progressing   Problem: Coping: Goal: Level of anxiety will decrease Outcome: Progressing   Problem: Elimination: Goal: Will not experience complications related to bowel motility Outcome: Progressing Goal: Will not experience complications related to urinary retention Outcome: Progressing   Problem: Pain Managment: Goal: General experience of comfort will improve and/or be controlled Outcome: Progressing   Problem: Safety: Goal: Ability to remain free from injury will improve Outcome: Progressing   Problem: Skin Integrity: Goal: Risk for impaired skin integrity will decrease Outcome: Progressing   Problem: Education: Goal: Ability to describe self-care measures that may prevent or decrease complications (Diabetes Survival Skills Education) will improve Outcome: Progressing Goal: Individualized Educational Video(s) Outcome: Progressing   Problem: Coping: Goal: Ability to adjust to condition or change in health will improve Outcome: Progressing   Problem: Health  Behavior/Discharge Planning: Goal: Ability to identify and utilize available resources and services will improve Outcome: Progressing Goal: Ability to manage health-related needs will improve Outcome: Progressing   Problem: Metabolic: Goal: Ability to maintain appropriate glucose levels will improve Outcome: Progressing

## 2023-12-14 DIAGNOSIS — I161 Hypertensive emergency: Secondary | ICD-10-CM | POA: Diagnosis not present

## 2023-12-14 DIAGNOSIS — F1410 Cocaine abuse, uncomplicated: Secondary | ICD-10-CM | POA: Diagnosis not present

## 2023-12-14 DIAGNOSIS — I169 Hypertensive crisis, unspecified: Secondary | ICD-10-CM | POA: Diagnosis not present

## 2023-12-14 DIAGNOSIS — G40901 Epilepsy, unspecified, not intractable, with status epilepticus: Secondary | ICD-10-CM | POA: Diagnosis not present

## 2023-12-14 LAB — CBC
HCT: 45.2 % (ref 39.0–52.0)
Hemoglobin: 15.7 g/dL (ref 13.0–17.0)
MCH: 32.2 pg (ref 26.0–34.0)
MCHC: 34.7 g/dL (ref 30.0–36.0)
MCV: 92.8 fL (ref 80.0–100.0)
Platelets: 144 K/uL — ABNORMAL LOW (ref 150–400)
RBC: 4.87 MIL/uL (ref 4.22–5.81)
RDW: 13.4 % (ref 11.5–15.5)
WBC: 6.5 K/uL (ref 4.0–10.5)
nRBC: 0 % (ref 0.0–0.2)

## 2023-12-14 LAB — GLUCOSE, CAPILLARY
Glucose-Capillary: 118 mg/dL — ABNORMAL HIGH (ref 70–99)
Glucose-Capillary: 132 mg/dL — ABNORMAL HIGH (ref 70–99)
Glucose-Capillary: 136 mg/dL — ABNORMAL HIGH (ref 70–99)
Glucose-Capillary: 138 mg/dL — ABNORMAL HIGH (ref 70–99)
Glucose-Capillary: 93 mg/dL (ref 70–99)
Glucose-Capillary: 94 mg/dL (ref 70–99)

## 2023-12-14 LAB — RENAL FUNCTION PANEL
Albumin: 2.7 g/dL — ABNORMAL LOW (ref 3.5–5.0)
Anion gap: 11 (ref 5–15)
BUN: 8 mg/dL (ref 8–23)
CO2: 23 mmol/L (ref 22–32)
Calcium: 9.2 mg/dL (ref 8.9–10.3)
Chloride: 105 mmol/L (ref 98–111)
Creatinine, Ser: 1.25 mg/dL — ABNORMAL HIGH (ref 0.61–1.24)
GFR, Estimated: >60 mL/min (ref >60)
Glucose, Bld: 98 mg/dL (ref 70–99)
Phosphorus: 2.7 mg/dL (ref 2.5–4.6)
Potassium: 3.3 mmol/L — ABNORMAL LOW (ref 3.5–5.1)
Sodium: 139 mmol/L (ref 135–145)

## 2023-12-14 LAB — TRIGLYCERIDES: Triglycerides: 153 mg/dL — ABNORMAL HIGH (ref <150)

## 2023-12-14 MED ORDER — HYDRALAZINE HCL 20 MG/ML IJ SOLN: 10.0000 mg | Freq: Four times a day (QID) | INTRAMUSCULAR | Status: DC | PRN

## 2023-12-14 NOTE — Progress Notes (Signed)
 [facility]                                                                              Philip [name], is a 68 y.o. male, DOB - 1956/03/24, FMW:[id number] Admit date - 12/10/2023    Outpatient Primary MD for the patient is Zollie Lowers, MD  LOS - 4  days  Chief Complaint  Patient presents with   Seizures   Code Stroke       Brief summary   Patient is a 68 year old male with uncontrolled HTN, DM, GERD, OSA, prior CVA presented to [facility] ER on 7/17 with acute onset left-sided weakness.  He called 919.  This resolved with 5 mg midazolam  was obtunded on arrival to ER and was intubated. Patient's BP was 250/130, improved with labetalol  and propofol .  He was given Keppra  and neurology was also consulted for EEG.  Patient was admitted to ICU by PCCM.  Patient was transferred to the floor and [facility] assumed care on 12/13/2023  Significant Hospital Events:   7/17 ADMITTED. Hypertensive crisis, seizure, [facility]G & MRI fine, weaned off propofol , extubated. Scalp abscess lanced.   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  No new strokes. - Patient has been seen by neurology, recommended cocaine cessation, BP control - he has been weaned off of Precedex , currently alert and oriented - Goal BP less than 160 - BP still elevated however appears to be in acute alcohol withdrawals as well, continue hydralazine  50 mg 3 times daily, amlodipine  10 mg daily, losartan  25 mg daily - Hydralazine  IV as needed with parameters.  Beta-blocker discontinued due to cocaine   Witnessed seizures, likely due to hypertensive emergency - EEG 7/18 showed moderate to severe  diffuse encephalopathy.  No seizure or epileptiform discharges seen - Per neurology, likely had provoked seizures in the setting of cocaine use, hypertensive emergency however given prior history of seizures, left-sided weakness, recommended patient stay on antiseizure medications - Resumed gabapentin , Topamax  50 mg twice daily, continue Keppra  500 mg twice daily - Outpatient follow-up with neurology  Acute metabolic encephalopathy, acute alcohol withdrawals - Likely due to #1 and #2, cocaine abuse - Mental status worsening, confused, RN confirmed from his sister yesterday that he drinks alcohol daily  more than water -Placed on CIWA protocol with Ativan , thiamine , folate, MVI -CIWA 11 this morning at 3 AM     Acute hypoxic respiratory failure with hypoxia requiring mechanical ventilation due to seizure, likely aspiration - extubated 7/18 -Pulmonary hygiene - Stable, on room air   AKI  resolved; no longer appears to have baseline CKD - Creatinine 1.5 on 7/18, I - Improved   Hyperglycemia; no evidence of DM - Hemoglobin A1c 5.1 on 12/10/2023    Skin abscess posterior R scalp  - General Surgery following, I&D on 7/18  -cultures that showed Staph aureus, staph lugdunensis, continue  vancomycin , can transition to oral linezolid  or clindamycin at discharge   Estimated body mass index is 25.48 kg/m as calculated from the following:   Height as of this encounter: 5' 7.01 (1.702 m).   Weight as of this encounter: 73.8 kg.  Code Status: Full code DVT Prophylaxis:  heparin  injection 5,000 Units Start: 12/10/23 1530   Level of Care: Level of care: Telemetry Medical Family Communication: Updated patient Disposition Plan:      Remains inpatient appropriate:   PT recommended SNF   Procedures:  Mechanical ventilation EEG  Consultants:   Neurology Pulmonary critical care  Antimicrobials:   Anti-infectives (From admission, onward)    Start     Dose/Rate Route Frequency Ordered  Stop   12/11/23 1500  vancomycin  (VANCOREADY) IVPB 1250 mg/250 mL        1,250 mg 166.7 mL/hr over 90 Minutes Intravenous Every 24 hours 12/10/23 1449 12/15/23 2359   12/10/23 1530  vancomycin  (VANCOREADY) IVPB 1500 mg/300 mL        1,500 mg 150 mL/hr over 120 Minutes Intravenous  Once 12/10/23 1440 12/10/23 1757          Medications  amLODipine   10 mg Oral Daily   aspirin   81 mg Oral Daily   Chlorhexidine  Gluconate Cloth  6 each Topical Daily   docusate  100 mg Oral BID   feeding supplement  1 Container Oral TID BM   folic acid   1 mg Oral Daily   gabapentin   300 mg Oral QHS   heparin   5,000 Units Subcutaneous Q8H   hydrALAZINE   50 mg Oral TID   insulin  aspart  0-15 Units Subcutaneous Q4H   levETIRAcetam   500 mg Intravenous BID   losartan   25 mg Oral Daily   multivitamin with minerals  1 tablet Oral Daily   nicotine   21 mg Transdermal Daily   pantoprazole  (PROTONIX ) IV  40 mg Intravenous Q24H   thiamine   100 mg Oral Daily   Or   thiamine   100 mg Intravenous Daily   topiramate   50 mg Oral BID      Subjective:   Philip [name] was seen and examined today.  Somewhat confused and agitated today, overnight CIWA 11, placed on Ativan  with CIWA protocol BP still elevated Objective:   Vitals:   12/13/23 1954 12/14/23 0342 12/14/23 0728 12/14/23 0818  BP: (!) 142/84 (!) 163/91 (!) 174/109 (!) 174/109  Pulse: 85 71 75   Resp: 18 18 18    Temp: 98.9 F (37.2 C) 98.4 F (36.9 C) 98.2 F (36.8 C)   TempSrc: Oral Oral Oral   SpO2: 99% 98% 100%   Weight:      Height:        Intake/Output Summary (Last 24 hours) at 12/14/2023 1238 Last data filed at 12/14/2023 0800 Gross per 24 hour  Intake 600 ml  Output 1100 ml  Net -500 ml     Wt Readings from Last 3 Encounters:  12/13/23 73.8 kg  12/07/23 76.7 kg  09/25/23 76.9 kg    Physical Exam General: Alert and awake more confused and anxious Cardiovascular: S1 S2 clear, RRR.  Respiratory: CTAB, no  wheezing Gastrointestinal: Soft, nontender, nondistended, NBS Ext: no pedal edema bilaterally Neuro: no new deficits Skin: Dressing intact on the scalp Psych: Anxious   Data Reviewed:  I have personally reviewed following labs    CBC Lab Results  Component Value Date   WBC 6.5 12/14/2023   RBC 4.87 12/14/2023   HGB 15.7  12/14/2023   HCT 45.2 12/14/2023   MCV 92.8 12/14/2023   MCH 32.2 12/14/2023   PLT 144 (L) 12/14/2023   MCHC 34.7 12/14/2023   RDW 13.4 12/14/2023   LYMPHSABS 1.3 12/10/2023   MONOABS 0.9 12/10/2023   EOSABS 1.2 (H) 12/10/2023   BASOSABS 0.1 12/10/2023     Last metabolic panel Lab Results  Component Value Date   NA 139 12/14/2023   K 3.3 (L) 12/14/2023   CL 105 12/14/2023   CO2 23 12/14/2023   BUN 8 12/14/2023   CREATININE 1.25 (H) 12/14/2023   GLUCOSE 98 12/14/2023   GFRNONAA >60 12/14/2023   GFRAA 58 (L) 01/09/2020   CALCIUM  9.2 12/14/2023   PHOS 2.7 12/14/2023   PROT 8.1 12/10/2023   ALBUMIN 2.7 (L) 12/14/2023   LABGLOB 3.3 11/17/2022   AGRATIO 1.6 10/24/2021   BILITOT 0.9 12/10/2023   ALKPHOS 126 12/10/2023   AST 40 12/10/2023   ALT 15 12/10/2023   ANIONGAP 11 12/14/2023    CBG (last 3)  Recent Labs    12/14/23 0336 12/14/23 0725 12/14/23 1152  GLUCAP 132* 94 118*      Coagulation Profile: Recent Labs  Lab 12/10/23 0810  INR 1.0     Radiology Studies: I have personally reviewed the imaging studies  No results found.      Nydia Distance M.D. [facility] 12/14/2023, 12:38 PM  Available via Epic secure chat 7am-7pm After 7 pm, please refer to night coverage provider listed on amion.

## 2023-12-14 NOTE — TOC Progression Note (Signed)
 Transition of Care [facility]) - Progression Note    Patient Details  Name: Philip [name] MRN: [id number] Date of Birth: 20-Nov-1955  Transition of Care [facility]) CM/SW Contact  [name] Moose, LCSW Phone Number: 12/14/2023, 12:19 PM  Clinical Narrative:    CSW spoke with pt sister, [name], about SNF when pt is medically ready to d/c. [name] stated she thinks it would be beneficial for pt to go but he will not want to. [name] gave CSW permission to send out referrals to SNF.    Expected Discharge Plan: Skilled Nursing Facility Barriers to Discharge: Continued Medical Work up  Expected Discharge Plan and Services       Living arrangements for the past 2 months: Single Family Home                                       Social Determinants of Health (SDOH) Interventions SDOH Screenings   Food Insecurity: No Food Insecurity (12/07/2023)  Housing: Unknown (12/07/2023)  Transportation Needs: No Transportation Needs (12/07/2023)  Utilities: Not At Risk (12/07/2023)  Alcohol Screen: Low Risk  (12/07/2023)  Depression (PHQ2-9): Low Risk  (12/07/2023)  Financial Resource Strain: Low Risk  (12/07/2023)  Physical Activity: Insufficiently Active (12/07/2023)  Social Connections: Socially Isolated (12/07/2023)  Stress: No Stress Concern Present (12/07/2023)  Tobacco Use: High Risk (12/11/2023)  Health Literacy: Adequate Health Literacy (12/07/2023)    Readmission Risk Interventions     No data to display

## 2023-12-14 NOTE — Evaluation (Signed)
 Occupational Therapy Evaluation Patient Details Name: Philip [name] MRN: [id number] DOB: 15-Sep-1955 Today's Date: 12/14/2023   History of Present Illness   68 y/o gentleman who awoke 12/10/23 with L-sided weakness and dysarthria. 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  Limited OT assessment due to decreased LOA, patient unable to keep his eyes open, and falling back to sleep when not constantly stimulated.  Attempted EOB sitting, Max A for all ADL and mobility given LOA.  OT will follow in the acute setting, and given PT recommendation for SNF, OT to follow along given limited eval.  Patient will benefit from continued inpatient follow up therapy, <3 hours/day.     If plan is discharge home, recommend the following:   Assist for transportation;A lot of help with bathing/dressing/bathroom;Two people to help with walking and/or transfers     Functional Status Assessment   Patient has had a recent decline in their functional status and demonstrates the ability to make significant improvements in function in a reasonable and predictable amount of time.     Equipment Recommendations   None recommended by OT     Recommendations for Other Services         Precautions/Restrictions   Precautions Precautions: Fall Restrictions Weight Bearing Restrictions Per Provider Order: No     Mobility Bed Mobility Overal bed mobility: Needs Assistance Bed Mobility: Rolling, Supine to Sit, Sit to Supine Rolling: Max assist   Supine to sit: Max assist Sit to supine: Max assist, Total assist        Transfers                   General transfer comment: Decreased LOA      Balance  Overall balance assessment: Needs assistance Sitting-balance support: Feet supported, No upper extremity supported Sitting balance-Leahy Scale: Zero   Postural control: Posterior lean, Right lateral lean, Left lateral lean                                 ADL either performed or assessed with clinical judgement   ADL Overall ADL's : Needs assistance/impaired Eating/Feeding: Maximal assistance;Bed level   Grooming: Maximal assistance;Bed level   Upper Body Bathing: Maximal assistance;Bed level   Lower Body Bathing: Maximal assistance;Bed level   Upper Body Dressing : Maximal assistance;Bed level   Lower Body Dressing: Maximal assistance;Bed level   Toilet Transfer: Maximal assistance;+2 for physical assistance                   Vision   Vision Assessment?: Vision impaired- to be further tested in functional context     Perception Perception: Not tested       Praxis Praxis: Not tested       Pertinent Vitals/Pain Pain Assessment Pain Assessment: No/denies pain     Extremity/Trunk Assessment Upper Extremity Assessment Upper Extremity Assessment: Difficult to assess due to impaired cognition   Lower Extremity Assessment Lower Extremity Assessment: Defer to PT evaluation       Communication Communication Communication: Impaired Factors Affecting Communication: Reduced clarity of speech   Cognition Arousal: Lethargic, Obtunded Behavior During Therapy: Flat affect Cognition: Difficult to  assess Difficult to assess due to: Level of arousal                             Following commands: Impaired Following commands impaired: Only follows one step commands consistently     Cueing  General Comments   Cueing Techniques: Verbal cues   VSS   Exercises     Shoulder Instructions      Home Living   Living Arrangements: Alone                               Additional Comments: Pt lives alone in an apartment in  [place]      Prior Functioning/Environment Prior Level of Function : Independent/Modified Independent               ADLs Comments: Sister states he no longer has a Gaffer, relys on family.  Ind with ADL and iADL.    OT Problem List: Decreased cognition   OT Treatment/Interventions: Self-care/ADL training;Therapeutic activities;Patient/family education;Balance training;DME and/or AE instruction      OT Goals(Current goals can be found in the care plan section)   Acute Rehab OT Goals OT Goal Formulation: Patient unable to participate in goal setting Time For Goal Achievement: 12/28/23 Potential to Achieve Goals: Fair ADL Goals Pt Will Perform Grooming: with supervision;sitting Pt Will Perform Upper Body Dressing: with supervision;sitting Pt Will Perform Lower Body Dressing: with min assist;sit to/from stand Pt Will Transfer to Toilet: with supervision;ambulating;regular height toilet   OT Frequency:  Min 2X/week    Co-evaluation              AM-PAC OT 6 Clicks Daily Activity     Outcome Measure Help from another person eating meals?: A Lot Help from another person taking care of personal grooming?: A Lot Help from another person toileting, which includes using toliet, bedpan, or urinal?: A Lot Help from another person bathing (including washing, rinsing, drying)?: A Lot Help from another person to put on and taking off regular upper body clothing?: A Lot Help from another person to put on and taking off regular lower body clothing?: A Lot 6 Click Score: 12   End of Session Nurse Communication: Mobility status  Activity Tolerance: Patient limited by lethargy Patient left: in bed;with call bell/phone within reach;with bed alarm set;with family/visitor present  OT Visit Diagnosis: Other symptoms and signs involving cognitive function                Time: 1100-1120 OT Time Calculation (min): 20 min Charges:  OT General Charges $OT Visit: 1 Visit OT  Evaluation $OT Eval Moderate Complexity: 1 Mod  12/14/2023  RP, OTR/L  Acute Rehabilitation Services  Office:  [phone]   Philip [name] 12/14/2023, 12:16 PM

## 2023-12-14 NOTE — NC FL2 (Signed)
 [place]  MEDICAID FL2 LEVEL OF CARE FORM     IDENTIFICATION  Patient Name: Philip [name] Birthdate: Aug 28, 1955 Sex: male Admission Date (Current Location): 12/10/2023  [facility] and [place] Number:  Producer, television/film/video and Address:  [facility] [place]. [facility], [address] [place], [place] [address]      Provider Number: [id number]  Attending Physician Name and Address:  Davia Nydia POUR, MD  Relative Name and Phone Number:       Current Level of Care: SNF Recommended Level of Care: Skilled Nursing Facility Prior Approval Number:    Date Approved/Denied:   PASRR Number: [id number]  Discharge Plan: SNF    Current Diagnoses: Patient Active Problem List   Diagnosis Date Noted   Hypertensive crisis 12/10/2023   Seizure (HCC) 12/10/2023   Calcaneal spur of left foot 04/29/2021   Right leg weakness 03/10/2021   PUD (peptic ulcer disease) 03/11/2016   Alcohol abuse, 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   Hepatic cirrhosis (HCC) 06/28/2015   Proteinuria 06/07/2014   Tobacco user    Hyperlipidemia 12/13/2012   OSA (obstructive sleep apnea) 11/03/2012   HTN (hypertension) 09/16/2012   History of seizure disorder, since CVA 09/16/2012   Asthma 09/16/2012    Orientation RESPIRATION BLADDER Height & Weight     Self, Time  Normal Incontinent, External catheter Weight: 162 lb 11.2 oz (73.8 kg) Height:  5' 7.01 (170.2 cm)  BEHAVIORAL SYMPTOMS/MOOD NEUROLOGICAL BOWEL NUTRITION STATUS      Incontinent Diet  AMBULATORY STATUS COMMUNICATION OF NEEDS Skin   Extensive Assist Verbally PU Stage and Appropriate Care (Multiple pressre wouds)                       Personal Care Assistance Level of Assistance  Bathing, Feeding, Dressing Bathing Assistance: Maximum assistance Feeding assistance: Limited assistance Dressing Assistance: Maximum assistance     Functional Limitations Info  Sight, Hearing, Speech Sight Info: Adequate Hearing Info: Adequate Speech Info:  Adequate    SPECIAL CARE FACTORS FREQUENCY  PT (By licensed PT), OT (By licensed OT)     PT Frequency: 5x a week OT Frequency: 5x a week            Contractures Contractures Info: Not present    Additional Factors Info  Code Status, Allergies Code Status Info: full Allergies Info: NKA           Current Medications (12/14/2023):  This is the current hospital active medication list Current Facility-Administered Medications  Medication Dose Route Frequency Provider Last Rate Last Admin   amLODipine  (NORVASC ) tablet 10 mg  10 mg Oral Daily Gretta Doffing P, DO   10 mg at 12/14/23 9180   aspirin  chewable tablet 81 mg  81 mg Oral Daily Smith, Joshua C, NP   81 mg at 12/14/23 0818   Chlorhexidine  Gluconate Cloth 2 % PADS 6 each  6 each Topical Daily Gretta Doffing SQUIBB, DO   6 each at 12/14/23 9180   docusate (COLACE) 50 MG/5ML liquid 100 mg  100 mg Oral BID Gretta Doffing SQUIBB, DO   100 mg at 12/14/23 9180   feeding supplement (BOOST / RESOURCE BREEZE) liquid 1 Container  1 Container Oral TID BM Gretta Doffing SQUIBB, DO   1 Container at 12/14/23 0820   folic acid  (FOLVITE ) tablet 1 mg  1 mg Oral Daily Rai, Ripudeep K, MD   1 mg at 12/14/23 0818   gabapentin  (NEURONTIN ) 250 MG/5ML solution 300 mg  300  mg Oral QHS Gretta Leita SQUIBB, DO   300 mg at 12/13/23 2234   heparin  injection 5,000 Units  5,000 Units Subcutaneous Q8H Whiteheart, Lamarr LABOR, NP   5,000 Units at 12/14/23 0541   hydrALAZINE  (APRESOLINE ) injection 10 mg  10 mg Intravenous Q6H PRN Rai, Ripudeep K, MD       hydrALAZINE  (APRESOLINE ) tablet 50 mg  50 mg Oral TID Gretta Leita P, DO   50 mg at 12/14/23 0818   insulin  aspart (novoLOG ) injection 0-15 Units  0-15 Units Subcutaneous Q4H Valri Lamarr LABOR, NP   2 Units at 12/14/23 9657   labetalol  (NORMODYNE ) injection 10 mg  10 mg Intravenous Q2H PRN Valri Lamarr LABOR, NP       levETIRAcetam  (KEPPRA ) undiluted injection 500 mg  500 mg Intravenous BID Yadav, Priyanka O, MD   500 mg at  12/14/23 9180   LORazepam  (ATIVAN ) tablet 1-4 mg  1-4 mg Oral Q1H PRN Rai, Ripudeep K, MD       Or   LORazepam  (ATIVAN ) injection 1-4 mg  1-4 mg Intravenous Q1H PRN Rai, Ripudeep K, MD   1 mg at 12/13/23 2234   LORazepam  (ATIVAN ) injection 2 mg  2 mg Intravenous Q4H PRN Valri Lamarr LABOR, NP   2 mg at 12/14/23 0340   losartan  (COZAAR ) tablet 25 mg  25 mg Oral Daily Rai, Ripudeep K, MD   25 mg at 12/14/23 0818   multivitamin with minerals tablet 1 tablet  1 tablet Oral Daily Rai, Ripudeep K, MD   1 tablet at 12/14/23 0818   nicotine  (NICODERM CQ  - dosed in mg/24 hours) patch 21 mg  21 mg Transdermal Daily Gretta Leita P, DO   21 mg at 12/13/23 1009   Oral care mouth rinse  15 mL Mouth Rinse PRN Gretta Leita P, DO       pantoprazole  (PROTONIX ) injection 40 mg  40 mg Intravenous Q24H Gretta Leita P, DO   40 mg at 12/13/23 1703   polyethylene glycol (MIRALAX  / GLYCOLAX ) packet 17 g  17 g Oral Daily PRN Gretta Leita P, DO       thiamine  (VITAMIN B1) tablet 100 mg  100 mg Oral Daily Rai, Ripudeep K, MD   100 mg at 12/14/23 9181   Or   thiamine  (VITAMIN B1) injection 100 mg  100 mg Intravenous Daily Rai, Ripudeep K, MD       topiramate  (TOPAMAX ) tablet 50 mg  50 mg Oral BID Gretta Leita P, DO   50 mg at 12/14/23 9180   vancomycin  (VANCOREADY) IVPB 1250 mg/250 mL  1,250 mg Intravenous Q24H Gretta Leita P, DO 166.7 mL/hr at 12/13/23 1610 1,250 mg at 12/13/23 1610     Discharge Medications: Please see discharge summary for a list of discharge medications.  Relevant Imaging Results:  Relevant Lab Results:   Additional Information SSN [id number]  Jeoffrey LITTIE Moose, LCSW

## 2023-12-14 NOTE — Progress Notes (Signed)
 Tele box 6N14 attached and on pt

## 2023-12-14 NOTE — Progress Notes (Signed)
 Physical Therapy Treatment Patient Details Name: Philip [name] MRN: [id number] DOB: Feb 29, 1956 Today's Date: 12/14/2023   History of Present Illness 68 y/o gentleman who awoke 12/10/23 with L-sided weakness and dysarthria. +seizure with EMS; intubated; HTN emergency 227/138; +scalp abscess with I&D bedside 7/18; CT Head bil thalami infarcts; CTA head near occlusive stenosis of L and R PCA; extubated 7/18 PMH- CVA in 2014, GERD, seizures, OSA, cocaine & tobacco abuse, HTN, hepatitis C    PT Comments  Pt resting in bed on arrival, awake and alert and agreeable to session. Pt with decreased insight into deficits, needing increased cues for safety throughout session as pt attempting to stand without RW present and before this PTA ready. Pt requiring up to mod A to progress gait with RW for support with pt drifting R bumping objects/obstacles and needing hands on assist to problem solve to correct as well as general assist to maintain balance in standing. Patient will benefit from continued inpatient follow up therapy, <3 hours/day to address deficits and maximize functional independence and decrease caregiver burden. Will continue to follow acutely.    If plan is discharge home, recommend the following: A lot of help with walking and/or transfers;A lot of help with bathing/dressing/bathroom;Assistance with cooking/housework;Assist for transportation;Help with stairs or ramp for entrance;Supervision due to cognitive status   Can travel by private vehicle     No  Equipment Recommendations  Other (comment) (TBA)    Recommendations for Other Services       Precautions / Restrictions Precautions Precautions: Fall Restrictions Weight Bearing Restrictions Per Provider Order: No     Mobility  Bed Mobility Overal bed mobility: Needs Assistance Bed Mobility: Supine to Sit     Supine to sit: Min assist     General bed mobility comments: min A to elevate trunk to sitting    Transfers Overall  transfer level: Needs assistance Equipment used: Rolling walker (2 wheels) Transfers: Sit to/from Stand, Bed to chair/wheelchair/BSC Sit to Stand: Min assist   Step pivot transfers: Min assist       General transfer comment: min A to steady on rise to stand and for RW management to step pivot to chair    Ambulation/Gait Ambulation/Gait assistance: Min assist, Mod assist Gait Distance (Feet): 36 Feet Assistive device: Rolling walker (2 wheels) Gait Pattern/deviations: Step-through pattern, Decreased stride length, Drifts right/left, Trunk flexed Gait velocity: decr     General Gait Details: pt ambualting with step through pattern with flexed knees throughout, needing up to mod A to maintain balance with increased postural sway and R drift, bumping RW into door fram on R and EOB   Stairs             Wheelchair Mobility     Tilt Bed    Modified Rankin (Stroke Patients Only) Modified Rankin (Stroke Patients Only) Pre-Morbid Rankin Score: No symptoms Modified Rankin: Severe disability     Balance Overall balance assessment: Needs assistance Sitting-balance support: Feet supported, No upper extremity supported Sitting balance-Leahy Scale: Fair     Standing balance support: No upper extremity supported, During functional activity Standing balance-Leahy Scale: Poor Standing balance comment: reliant on RW                            Communication Communication Communication: Impaired Factors Affecting Communication: Reduced clarity of speech  Cognition Arousal: Alert Behavior During Therapy: Flat affect  Following commands: Impaired Following commands impaired: Only follows one step commands consistently    Cueing Cueing Techniques: Verbal cues  Exercises      General Comments        Pertinent Vitals/Pain Pain Assessment Pain Assessment: No/denies pain    Home Living                           Prior Function            PT Goals (current goals can now be found in the care plan section) Acute Rehab PT Goals Patient Stated Goal: did not state PT Goal Formulation: With patient Time For Goal Achievement: 12/26/23 Progress towards PT goals: Progressing toward goals    Frequency    Min 3X/week      PT Plan      Co-evaluation              AM-PAC PT 6 Clicks Mobility   Outcome Measure  Help needed turning from your back to your side while in a flat bed without using bedrails?: A Little Help needed moving from lying on your back to sitting on the side of a flat bed without using bedrails?: A Little Help needed moving to and from a bed to a chair (including a wheelchair)?: A Lot Help needed standing up from a chair using your arms (e.g., wheelchair or bedside chair)?: A Little Help needed to walk in hospital room?: Total Help needed climbing 3-5 steps with a railing? : Total 6 Click Score: 13    End of Session   Activity Tolerance: Patient tolerated treatment well Patient left: in chair;with call bell/phone within reach (with lunch tray set up and door open) Nurse Communication: Mobility status PT Visit Diagnosis: Unsteadiness on feet (R26.81);Difficulty in walking, not elsewhere classified (R26.2)     Time: 1423-1440 PT Time Calculation (min) (ACUTE ONLY): 17 min  Charges:    $Gait Training: 8-22 mins PT General Charges $$ ACUTE PT VISIT: 1 Visit                     Philip Jesson R. PTA Acute Rehabilitation Services Office: [phone]   Philip [name] 12/14/2023, 4:22 PM

## 2023-12-14 NOTE — Plan of Care (Signed)
   Problem: Education: Goal: Knowledge of General Education information will improve Description Including pain rating scale, medication(s)/side effects and non-pharmacologic comfort measures Outcome: Progressing

## 2023-12-15 DIAGNOSIS — I161 Hypertensive emergency: Secondary | ICD-10-CM | POA: Diagnosis not present

## 2023-12-15 DIAGNOSIS — F1410 Cocaine abuse, uncomplicated: Secondary | ICD-10-CM | POA: Diagnosis not present

## 2023-12-15 DIAGNOSIS — I169 Hypertensive crisis, unspecified: Secondary | ICD-10-CM | POA: Diagnosis not present

## 2023-12-15 DIAGNOSIS — G40901 Epilepsy, unspecified, not intractable, with status epilepticus: Secondary | ICD-10-CM | POA: Diagnosis not present

## 2023-12-15 LAB — CBC
HCT: 49.9 % (ref 39.0–52.0)
Hemoglobin: 17.3 g/dL — ABNORMAL HIGH (ref 13.0–17.0)
MCH: 32.1 pg (ref 26.0–34.0)
MCHC: 34.7 g/dL (ref 30.0–36.0)
MCV: 92.6 fL (ref 80.0–100.0)
Platelets: 159 K/uL (ref 150–400)
RBC: 5.39 MIL/uL (ref 4.22–5.81)
RDW: 13.7 % (ref 11.5–15.5)
WBC: 5.5 K/uL (ref 4.0–10.5)
nRBC: 0 % (ref 0.0–0.2)

## 2023-12-15 LAB — AEROBIC/ANAEROBIC CULTURE W GRAM STAIN (SURGICAL/DEEP WOUND)

## 2023-12-15 LAB — GLUCOSE, CAPILLARY
Glucose-Capillary: 113 mg/dL — ABNORMAL HIGH (ref 70–99)
Glucose-Capillary: 125 mg/dL — ABNORMAL HIGH (ref 70–99)
Glucose-Capillary: 128 mg/dL — ABNORMAL HIGH (ref 70–99)
Glucose-Capillary: 138 mg/dL — ABNORMAL HIGH (ref 70–99)
Glucose-Capillary: 98 mg/dL (ref 70–99)

## 2023-12-15 LAB — RENAL FUNCTION PANEL
Albumin: 2.9 g/dL — ABNORMAL LOW (ref 3.5–5.0)
Anion gap: 10 (ref 5–15)
BUN: 11 mg/dL (ref 8–23)
CO2: 20 mmol/L — ABNORMAL LOW (ref 22–32)
Calcium: 9.5 mg/dL (ref 8.9–10.3)
Chloride: 107 mmol/L (ref 98–111)
Creatinine, Ser: 1.3 mg/dL — ABNORMAL HIGH (ref 0.61–1.24)
GFR, Estimated: 60 mL/min — ABNORMAL LOW (ref >60)
Glucose, Bld: 97 mg/dL (ref 70–99)
Phosphorus: 3 mg/dL (ref 2.5–4.6)
Potassium: 3.5 mmol/L (ref 3.5–5.1)
Sodium: 137 mmol/L (ref 135–145)

## 2023-12-15 MED ORDER — HYDRALAZINE HCL 50 MG PO TABS
75.0000 mg | ORAL_TABLET | Freq: Three times a day (TID) | ORAL | Status: DC
  Administered 2023-12-15 – 2023-12-17 (×7): 75 mg via ORAL
  Filled 2023-12-15 (×7): qty 1

## 2023-12-15 NOTE — Progress Notes (Signed)
 Pharmacy Antibiotic Note  Philip [name] is a 68 y.o. male admitted on 12/10/2023 with wound infection / head abscess.  Pharmacy has been consulted for vancomycin  dosing.   Scr 1.3 (baseline ~1.1).   Plan: Vancomycin  1250 mg IV Q24h (eAUC 505, goal AUC 400-550, Scr 1.3) Trend WBC, fever, renal function F/u cultures, clinical progress, levels as indicated De-escalate when able  Height: 5' 7.01 (170.2 cm) Weight: 73.8 kg (162 lb 11.2 oz) IBW/kg (Calculated) : 66.12  Temp (24hrs), Avg:98.1 F (36.7 C), Min:97.4 F (36.3 C), Max:98.7 F (37.1 C)  Recent Labs  Lab 12/10/23 1510 12/11/23 0423 12/12/23 0518 12/14/23 0746 12/15/23 0910  WBC 9.2 12.6* 7.5 6.5 5.5  CREATININE 1.40* 1.52* 1.18 1.25* 1.30*    Estimated Creatinine Clearance: 50.8 mL/min (A) (by C-G formula based on SCr of 1.3 mg/dL (H)).    No Known Allergies  Antimicrobials this admission: Vancomycin  7/17 >>  Microbiology results: 7/17 Sputum: pending  7/17 Abscess: staph lugdunensis, MSSA, GAS  7/17 MRSA PCR: pending  Thank you for allowing pharmacy to be a part of this patient's care.  Shelba Collier, PharmD, BCPS Clinical Pharmacist

## 2023-12-15 NOTE — Plan of Care (Signed)
  Problem: Activity: Goal: Risk for activity intolerance will decrease 12/15/2023 0327 by Branko Steeves C, RN Outcome: Progressing 12/15/2023 0327 by Louvella Greig BROCKS, RN Outcome: Progressing   Problem: Coping: Goal: Level of anxiety will decrease 12/15/2023 0327 by Felipe Cabell C, RN Outcome: Progressing 12/15/2023 0327 by Louvella Greig BROCKS, RN Outcome: Progressing   Problem: Safety: Goal: Ability to remain free from injury will improve 12/15/2023 0327 by Louvella Greig BROCKS, RN Outcome: Progressing 12/15/2023 0327 by Louvella Greig BROCKS, RN Outcome: Progressing

## 2023-12-15 NOTE — Progress Notes (Signed)
 [facility]                                                                              Philip [name], is a 68 y.o. male, DOB - Jan 20, 1956, FMW:[id number] Admit date - 12/10/2023    Outpatient Primary MD for the patient is Zollie Lowers, MD  LOS - 5  days  Chief Complaint  Patient presents with   Seizures   Code Stroke       Brief summary   Patient is a 68 year old male with uncontrolled HTN, DM, GERD, OSA, prior CVA presented to [facility] ER on 7/17 with acute onset left-sided weakness.  He called [phone].  This resolved with 5 mg midazolam  was obtunded on arrival to ER and was intubated. Patient's BP was 250/130, improved with labetalol  and propofol .  He was given Keppra  and neurology was also consulted for EEG.  Patient was admitted to ICU by PCCM.  Patient was transferred to the floor and [facility] assumed care on 12/13/2023  Significant Hospital Events:   7/17 ADMITTED. Hypertensive crisis, seizure, [facility]G & MRI fine, weaned off propofol , extubated. Scalp abscess lanced.   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  No new strokes. - Patient has been seen by neurology, recommended cocaine cessation, BP control, goal BP less than 160 - he has been weaned off of Precedex , currently alert and oriented Pulm- BP still elevated, increase hydralazine  to 75 mg 3 times daily, continue amlodipine  10 mg daily, losartan  25 mg daily - Hydralazine  IV as needed with parameters.  Beta-blocker discontinued due to cocaine   Witnessed seizures, likely due to hypertensive emergency - EEG 7/18 showed moderate to severe diffuse encephalopathy.  No seizure or epileptiform  discharges seen - Per neurology, likely had provoked seizures in the setting of cocaine use, HTN emergency however given prior history of seizures, left-sided weakness, recommended patient stay on antiseizure meds - Resumed gabapentin , Topamax  50 mg twice daily, continue Keppra  500 mg twice daily - Outpatient follow-up with neurology  Acute metabolic encephalopathy, acute alcohol withdrawals - Likely due to #1 and #2, cocaine abuse - Mental status worsening, confused, RN confirmed from his sister yesterday that he drinks alcohol daily  more than water - Continue CIWA protocol with Ativan , thiamine , folate, MVI -CIWA 11 last night     Acute hypoxic respiratory failure with hypoxia requiring mechanical ventilation due to seizure, likely aspiration - extubated 7/18 -Pulmonary hygiene - Stable, on room air   AKI  resolved; no longer appears to have baseline CKD - Cr improving, 1.3   Hyperglycemia; no evidence of DM - Hemoglobin A1c 5.1 on 12/10/2023    Skin abscess posterior R scalp  - General Surgery following, I&D on 7/18  -cultures that showed Staph aureus, staph lugdunensis, continue vancomycin , can transition to oral linezolid  or clindamycin at discharge   Estimated body mass index  is 25.48 kg/m as calculated from the following:   Height as of this encounter: 5' 7.01 (1.702 m).   Weight as of this encounter: 73.8 kg.  Code Status: Full code DVT Prophylaxis:  heparin  injection 5,000 Units Start: 12/10/23 1530   Level of Care: Level of care: Med-Surg Family Communication: Updated patient Disposition Plan:      Remains inpatient appropriate:   PT recommended SNF   Procedures:  Mechanical ventilation EEG  Consultants:   Neurology Pulmonary critical care  Antimicrobials:   Anti-infectives (From admission, onward)    Start     Dose/Rate Route Frequency Ordered Stop   12/11/23 1500  vancomycin  (VANCOREADY) IVPB 1250 mg/250 mL        1,250 mg 166.7 mL/hr over 90  Minutes Intravenous Every 24 hours 12/10/23 1449 12/15/23 2359   12/10/23 1530  vancomycin  (VANCOREADY) IVPB 1500 mg/300 mL        1,500 mg 150 mL/hr over 120 Minutes Intravenous  Once 12/10/23 1440 12/10/23 1757          Medications  amLODipine   10 mg Oral Daily   aspirin   81 mg Oral Daily   Chlorhexidine  Gluconate Cloth  6 each Topical Daily   docusate  100 mg Oral BID   feeding supplement  1 Container Oral TID BM   folic acid   1 mg Oral Daily   gabapentin   300 mg Oral QHS   heparin   5,000 Units Subcutaneous Q8H   hydrALAZINE   50 mg Oral TID   insulin  aspart  0-15 Units Subcutaneous Q4H   levETIRAcetam   500 mg Intravenous BID   losartan   25 mg Oral Daily   multivitamin with minerals  1 tablet Oral Daily   nicotine   21 mg Transdermal Daily   pantoprazole  (PROTONIX ) IV  40 mg Intravenous Q24H   thiamine   100 mg Oral Daily   Or   thiamine   100 mg Intravenous Daily   topiramate   50 mg Oral BID      Subjective:   Philip [name] was seen and examined today.  Still somewhat confused, however no acute chest pain headache, visual blurriness, shortness of breath, nausea or vomiting.  BP is still somewhat elevated  Objective:   Vitals:   12/14/23 2013 12/15/23 0458 12/15/23 0858 12/15/23 0900  BP: (!) 144/93 (!) 165/112 (!) 171/111 (!) 171/111  Pulse: 80 84  75  Resp: 17 18  16   Temp: (!) 97.4 F (36.3 C) 98.6 F (37 C)  97.6 F (36.4 C)  TempSrc: Oral   Oral  SpO2: 98% 98%  100%  Weight:      Height:        Intake/Output Summary (Last 24 hours) at 12/15/2023 1418 Last data filed at 12/14/2023 2100 Gross per 24 hour  Intake --  Output 650 ml  Net -650 ml     Wt Readings from Last 3 Encounters:  12/13/23 73.8 kg  12/07/23 76.7 kg  09/25/23 76.9 kg   Physical Exam General: Alert, still somewhat confused, NAD Cardiovascular: S1 S2 clear, RRR.  Respiratory: CTAB, no wheezing Gastrointestinal: Soft, nontender, nondistended, NBS Ext: no pedal edema  bilaterally Neuro: no new deficits Psych: Anxious  Data Reviewed:  I have personally reviewed following labs    CBC Lab Results  Component Value Date   WBC 5.5 12/15/2023   RBC 5.39 12/15/2023   HGB 17.3 (H) 12/15/2023   HCT 49.9 12/15/2023   MCV 92.6 12/15/2023   MCH 32.1 12/15/2023   PLT  159 12/15/2023   MCHC 34.7 12/15/2023   RDW 13.7 12/15/2023   LYMPHSABS 1.3 12/10/2023   MONOABS 0.9 12/10/2023   EOSABS 1.2 (H) 12/10/2023   BASOSABS 0.1 12/10/2023     Last metabolic panel Lab Results  Component Value Date   NA 137 12/15/2023   K 3.5 12/15/2023   CL 107 12/15/2023   CO2 20 (L) 12/15/2023   BUN 11 12/15/2023   CREATININE 1.30 (H) 12/15/2023   GLUCOSE 97 12/15/2023   GFRNONAA 60 (L) 12/15/2023   GFRAA 58 (L) 01/09/2020   CALCIUM  9.5 12/15/2023   PHOS 3.0 12/15/2023   PROT 8.1 12/10/2023   ALBUMIN 2.9 (L) 12/15/2023   LABGLOB 3.3 11/17/2022   AGRATIO 1.6 10/24/2021   BILITOT 0.9 12/10/2023   ALKPHOS 126 12/10/2023   AST 40 12/10/2023   ALT 15 12/10/2023   ANIONGAP 10 12/15/2023    CBG (last 3)  Recent Labs    12/15/23 0455 12/15/23 0730 12/15/23 1213  GLUCAP 113* 98 138*      Coagulation Profile: Recent Labs  Lab 12/10/23 0810  INR 1.0     Radiology Studies: I have personally reviewed the imaging studies  No results found.      Nydia Distance M.D. [facility] 12/15/2023, 2:18 PM  Available via Epic secure chat 7am-7pm After 7 pm, please refer to night coverage provider listed on amion.

## 2023-12-16 DIAGNOSIS — I169 Hypertensive crisis, unspecified: Secondary | ICD-10-CM | POA: Diagnosis not present

## 2023-12-16 LAB — RENAL FUNCTION PANEL
Albumin: 2.9 g/dL — ABNORMAL LOW (ref 3.5–5.0)
Anion gap: 9 (ref 5–15)
BUN: 12 mg/dL (ref 8–23)
CO2: 23 mmol/L (ref 22–32)
Calcium: 9.4 mg/dL (ref 8.9–10.3)
Chloride: 108 mmol/L (ref 98–111)
Creatinine, Ser: 1.23 mg/dL (ref 0.61–1.24)
GFR, Estimated: >60 mL/min (ref >60)
Glucose, Bld: 103 mg/dL — ABNORMAL HIGH (ref 70–99)
Phosphorus: 2.5 mg/dL (ref 2.5–4.6)
Potassium: 3.4 mmol/L — ABNORMAL LOW (ref 3.5–5.1)
Sodium: 140 mmol/L (ref 135–145)

## 2023-12-16 LAB — GLUCOSE, CAPILLARY
Glucose-Capillary: 109 mg/dL — ABNORMAL HIGH (ref 70–99)
Glucose-Capillary: 112 mg/dL — ABNORMAL HIGH (ref 70–99)
Glucose-Capillary: 112 mg/dL — ABNORMAL HIGH (ref 70–99)
Glucose-Capillary: 121 mg/dL — ABNORMAL HIGH (ref 70–99)
Glucose-Capillary: 131 mg/dL — ABNORMAL HIGH (ref 70–99)
Glucose-Capillary: 156 mg/dL — ABNORMAL HIGH (ref 70–99)
Glucose-Capillary: 93 mg/dL (ref 70–99)

## 2023-12-16 LAB — CBC
HCT: 47.7 % (ref 39.0–52.0)
Hemoglobin: 16.4 g/dL (ref 13.0–17.0)
MCH: 32 pg (ref 26.0–34.0)
MCHC: 34.4 g/dL (ref 30.0–36.0)
MCV: 93 fL (ref 80.0–100.0)
Platelets: 142 K/uL — ABNORMAL LOW (ref 150–400)
RBC: 5.13 MIL/uL (ref 4.22–5.81)
RDW: 13.8 % (ref 11.5–15.5)
WBC: 6 K/uL (ref 4.0–10.5)
nRBC: 0 % (ref 0.0–0.2)

## 2023-12-16 MED ORDER — ASPIRIN 81 MG PO CHEW: 81.0000 mg | CHEWABLE_TABLET | Freq: Every day | ORAL | 0 refills | Status: DC

## 2023-12-16 MED ORDER — POTASSIUM CHLORIDE CRYS ER 20 MEQ PO TBCR
40.0000 meq | EXTENDED_RELEASE_TABLET | Freq: Once | ORAL | Status: AC
  Administered 2023-12-16: 40 meq via ORAL
  Filled 2023-12-16: qty 2

## 2023-12-16 MED ORDER — POLYETHYLENE GLYCOL 3350 17 G PO PACK: 17.0000 g | PACK | Freq: Every day | ORAL | 0 refills | Status: DC | PRN

## 2023-12-16 MED ORDER — VANCOMYCIN HCL 1250 MG/250ML IV SOLN
1250.0000 mg | INTRAVENOUS | Status: DC
  Filled 2023-12-16: qty 250

## 2023-12-16 MED ORDER — AMLODIPINE BESYLATE 10 MG PO TABS: 10.0000 mg | ORAL_TABLET | Freq: Every day | ORAL | 0 refills | Status: DC

## 2023-12-16 MED ORDER — LINEZOLID 600 MG PO TABS: 600.0000 mg | ORAL_TABLET | Freq: Two times a day (BID) | ORAL | 0 refills | Status: DC

## 2023-12-16 MED ORDER — LINEZOLID 600 MG PO TABS: 600.0000 mg | ORAL_TABLET | Freq: Two times a day (BID) | ORAL | 0 refills | Status: AC

## 2023-12-16 MED ORDER — VITAMIN B-1 100 MG PO TABS: 100.0000 mg | ORAL_TABLET | Freq: Every day | ORAL | 0 refills | Status: DC

## 2023-12-16 MED ORDER — HYDRALAZINE HCL 25 MG PO TABS: 75.0000 mg | ORAL_TABLET | Freq: Three times a day (TID) | ORAL | 2 refills | Status: DC

## 2023-12-16 MED ORDER — LEVETIRACETAM 500 MG PO TABS
500.0000 mg | ORAL_TABLET | Freq: Two times a day (BID) | ORAL | Status: DC
  Administered 2023-12-16 – 2023-12-17 (×3): 500 mg via ORAL
  Filled 2023-12-16 (×3): qty 1

## 2023-12-16 MED ORDER — LINEZOLID 600 MG PO TABS
600.0000 mg | ORAL_TABLET | Freq: Two times a day (BID) | ORAL | Status: DC
  Administered 2023-12-16 – 2023-12-17 (×3): 600 mg via ORAL
  Filled 2023-12-16 (×3): qty 1

## 2023-12-16 MED ORDER — DOCUSATE SODIUM 50 MG/5ML PO LIQD: 100.0000 mg | Freq: Two times a day (BID) | ORAL | 0 refills | Status: DC

## 2023-12-16 MED ORDER — SODIUM CHLORIDE 0.9 % IV SOLN: 500.0000 mg | Freq: Two times a day (BID) | INTRAVENOUS | 2 refills | Status: DC

## 2023-12-16 MED ORDER — FOLIC ACID 1 MG PO TABS: 1.0000 mg | ORAL_TABLET | Freq: Every day | ORAL | 1 refills | Status: DC

## 2023-12-16 MED ORDER — LOSARTAN POTASSIUM 25 MG PO TABS: 25.0000 mg | ORAL_TABLET | Freq: Every day | ORAL | 0 refills | Status: DC

## 2023-12-16 MED ORDER — LEVETIRACETAM 500 MG PO TABS: 500.0000 mg | ORAL_TABLET | Freq: Two times a day (BID) | ORAL | 2 refills | Status: DC

## 2023-12-16 MED ORDER — GABAPENTIN 300 MG PO CAPS: 600.0000 mg | ORAL_CAPSULE | Freq: Every day | ORAL | 3 refills | Status: DC

## 2023-12-16 MED ORDER — NICOTINE 21 MG/24HR TD PT24: 21.0000 mg | MEDICATED_PATCH | Freq: Every day | TRANSDERMAL | 0 refills | Status: DC

## 2023-12-16 NOTE — Plan of Care (Signed)
  Problem: Coping: Goal: Ability to adjust to condition or change in health will improve Outcome: Progressing   Problem: Safety: Goal: Non-violent Restraint(s) Outcome: Progressing

## 2023-12-16 NOTE — Progress Notes (Signed)
 Physical Therapy Treatment Patient Details Name: Philip [name] MRN: [id number] DOB: 1956-01-09 Today's Date: 12/16/2023   History of Present Illness 68 y/o gentleman who awoke 12/10/23 with L-sided weakness and dysarthria. 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 Pt able to complete bed mobility and transfers with grossly CGA for safety as pt with decreased safety awareness and increased impulsivity, standing prior to this PTA placing RW in front of pt. Pt requiring up to min A during gait with RW for support to steady as pt drifting R with fatigue and bumping objects. Pt aware but with poor ability for anticipatory corrections. Pt up in chair at end of session with sister present. Patient will benefit from continued inpatient follow up therapy, <3 hours/day. Pt continues to benefit from skilled PT services to progress toward functional mobility goals.     If plan is discharge home, recommend the following: A lot of help with walking and/or transfers;A lot of help with bathing/dressing/bathroom;Assistance with cooking/housework;Assist for transportation;Help with stairs or ramp for entrance;Supervision due to cognitive status   Can travel by private vehicle     No  Equipment Recommendations  Other (comment) (TBA)    Recommendations for Other Services       Precautions / Restrictions Precautions Precautions: Fall Restrictions Weight Bearing Restrictions Per Provider Order: No     Mobility  Bed Mobility Overal bed mobility: Needs Assistance Bed Mobility: Supine to Sit     Supine to sit: Contact guard     General bed mobility comments: CGA for  safety    Transfers Overall transfer level: Needs assistance Equipment used: Rolling walker (2 wheels), None Transfers: Sit to/from Stand, Bed to chair/wheelchair/BSC Sit to Stand: Contact guard assist           General transfer comment: pt standing x2 from EOB at lowest height without AD support before RW placed in front of pt, standing to RW with CGA for safety    Ambulation/Gait Ambulation/Gait assistance: Min assist, Contact guard assist Gait Distance (Feet): 150 Feet Assistive device: Rolling walker (2 wheels) Gait Pattern/deviations: Step-through pattern, Decreased stride length, Drifts right/left, Trunk flexed Gait velocity: decr     General Gait Details: pt ambualting with step through pattern needing up to min A to maintain balance as pt bumping objects on R and with near LOB needing min A to correct.   Stairs             Wheelchair Mobility     Tilt Bed    Modified Rankin (Stroke Patients Only) Modified Rankin (Stroke Patients Only) Pre-Morbid Rankin Score: No symptoms Modified Rankin: Moderately severe disability     Balance Overall balance assessment: Needs assistance Sitting-balance support: Feet supported, No upper extremity supported Sitting balance-Leahy Scale: Fair     Standing balance support: No upper extremity supported, During functional activity Standing balance-Leahy Scale: Poor Standing balance comment: reliant on RW                            Communication Communication Communication: Impaired Factors Affecting Communication: Reduced clarity of speech  Cognition Arousal: Alert Behavior During  Therapy: WFL for tasks assessed/performed, Impulsive                           PT - Cognition Comments: can be impulsive, but receptive to ceus for safety Following commands: Impaired Following commands impaired: Only follows one step commands consistently    Cueing Cueing Techniques: Verbal cues  Exercises       General Comments General comments (skin integrity, edema, etc.): pt sister present and supportive throughout session      Pertinent Vitals/Pain Pain Assessment Pain Assessment: No/denies pain    Home Living                          Prior Function            PT Goals (current goals can now be found in the care plan section) Acute Rehab PT Goals Patient Stated Goal: did not state PT Goal Formulation: With patient Time For Goal Achievement: 12/26/23 Progress towards PT goals: Progressing toward goals    Frequency    Min 3X/week      PT Plan      Co-evaluation              AM-PAC PT 6 Clicks Mobility   Outcome Measure  Help needed turning from your back to your side while in a flat bed without using bedrails?: A Little Help needed moving from lying on your back to sitting on the side of a flat bed without using bedrails?: A Little Help needed moving to and from a bed to a chair (including a wheelchair)?: A Little Help needed standing up from a chair using your arms (e.g., wheelchair or bedside chair)?: A Little Help needed to walk in hospital room?: A Little Help needed climbing 3-5 steps with a railing? : Total 6 Click Score: 16    End of Session Equipment Utilized During Treatment: Gait belt Activity Tolerance: Patient tolerated treatment well Patient left: in chair;with call bell/phone within reach;with family/visitor present Nurse Communication: Mobility status PT Visit Diagnosis: Unsteadiness on feet (R26.81);Difficulty in walking, not elsewhere classified (R26.2)     Time: 8871-8853 PT Time Calculation (min) (ACUTE ONLY): 18 min  Charges:    $Gait Training: 8-22 mins PT General Charges $$ ACUTE PT VISIT: 1 Visit                     Dacian Orrico R. PTA Acute Rehabilitation Services Office: [phone]   Therisa CHRISTELLA Boor 12/16/2023, 11:57 AM

## 2023-12-16 NOTE — Discharge Summary (Signed)
 Physician Discharge Summary   Patient: Philip [name] MRN: [id number] DOB: [birth year]  Admit date:     12/10/2023  Discharge date: 12/16/23  Discharge Physician: Owen DELENA Lore   PCP: Zollie Lowers, MD   Recommendations at discharge:    Needs follow up with neurology post seizure Needs Follow up for further management of BP  Discharge Diagnoses: Principal Problem:   Hypertensive crisis Active Problems:   Seizure [facility])  Resolved Problems:   * No resolved hospital problems. *  Hospital Course: 68 year old past medical history significant for uncontrolled hypertension, diabetes, GERD, OSA, prior CVA presented to [facility] ER on 7/17 with acute onset left-sided weakness.  He called 911.  Patient received 5 mg of midazolam  and was obtunded on arrival to the ER and was intubated.  Patient's blood pressure was 250/130, improved with labetalol  and propofol .  He was given Keppra  and neurology was also consulted for EEG.  Patient was admitted to the ICU by PCCM.  Patient was transferred to [facility] care on 12/13/2023.   Significant hospital events: 7/17 ADMITTED. Hypertensive crisis, seizure, [facility]G & MRI fine, weaned off propofol , extubated. Scalp abscess lanced.     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  No new strokes. - Patient has been seen by neurology, recommended cocaine cessation, BP control, goal BP less than 160 - he has been weaned off of Precedex . -Continue with Hydralazine  75 mg 3 times daily, continue amlodipine  10 mg daily, losartan  25 mg daily - Hydralazine  IV as needed with parameters.  Beta-blocker discontinued due to cocaine     Breakthrough seizure witnessed in the setting of hypertensive emergency  -- EEG 7/18 showed moderate to severe diffuse encephalopathy.  No seizure or epileptiform discharges seen - Per neurology, likely had provoked seizures in the setting of cocaine use, HTN emergency however given prior history of seizures, left-sided weakness, recommended patient stay on antiseizure meds - Resumed gabapentin , Topamax  50 mg twice daily, continue Keppra  500 mg twice daily - Outpatient follow-up with neurology     Acute metabolic encephalopathy, acute alcohol withdrawal -- Likely due to #1 and #2, cocaine abuse - Mental status worsening, confused, RN confirmed from his sister yesterday that he drinks alcohol daily  more than water - Continue CIWA protocol with Ativan , thiamine , folate, MVI -stable for transfer  Acute hypoxic respiratory failure with hypoxia requiring mechanical ventilation due to seizure, aspiration event: --Pulmonary hygiene - Stable, on room air    AKI -Stable.   Hyperglycemia no evidence of diabetes 1Ac: 5.1 -A1c: 5.1   Posterior right scalp skin abscess: -- General Surgery following, I&D on 7/18  -cultures that showed Staph aureus, staph lugdunensis, continue vancomycin , can transition to oral linezolid  at discharge     Hypokalemia: -replete with oral potassium.            Consultants: CCM, neurology, sx Procedures performed: scal abscess drainage.  Disposition: Skilled nursing facility Diet recommendation:  Discharge Diet Orders (From admission, onward)     Start     Ordered   12/16/23 0000  Diet - low sodium heart healthy        12/16/23 1152           Cardiac diet DISCHARGE MEDICATION:  Allergies as of 12/16/2023   No Known Allergies      Medication List     STOP taking these medications    metoprolol  succinate 50 MG 24 hr tablet Commonly known as: TOPROL -XL   sildenafil  20 MG tablet Commonly known as: REVATIO        TAKE these  medications    amLODipine  10 MG tablet Commonly known as: NORVASC  Take 1 tablet (10 mg total) by mouth daily.   aspirin  81 MG chewable tablet Chew 1 tablet (81 mg total) by mouth daily. Start taking on: December 17, 2023   atorvastatin  40 MG tablet Commonly known as: LIPITOR Take 1 tablet (40 mg total) by mouth daily. for cholesterol.   docusate 50 MG/5ML liquid Commonly known as: COLACE Take 10 mLs (100 mg total) by mouth 2 (two) times daily.   folic acid  1 MG tablet Commonly known as: FOLVITE  Take 1 tablet (1 mg total) by mouth daily. Start taking on: December 17, 2023   gabapentin  300 MG capsule Commonly known as: NEURONTIN  Take 2 capsules (600 mg total) by mouth at bedtime.   hydrALAZINE  25 MG tablet Commonly known as: APRESOLINE  Take 3 tablets (75 mg total) by mouth 3 (three) times daily. What changed:  medication strength how much to take   levETIRAcetam  500 MG tablet Commonly known as: KEPPRA  Take 1 tablet (500 mg total) by mouth 2 (two) times daily.   linezolid  600 MG tablet Commonly known as: ZYVOX  Take 1 tablet (600 mg total) by mouth 2 (two) times daily for 5 days.   losartan  25 MG tablet Commonly known as: COZAAR  Take 1 tablet (25 mg total) by mouth daily. Start taking on: December 17, 2023   multivitamin with minerals Tabs tablet Take 1 tablet by mouth daily.   nicotine  21 mg/24hr patch Commonly known as: NICODERM CQ  - dosed in mg/24 hours Place 1 patch (21 mg total) onto the skin daily. Start taking on: December 17, 2023   pantoprazole  40 MG tablet Commonly known as: PROTONIX  Take 1 tablet (40 mg total) by mouth daily.   polyethylene glycol 17 g packet Commonly known as: MIRALAX  / GLYCOLAX  Take 17 g by mouth daily as needed for moderate constipation.   thiamine  100 MG tablet Commonly known as: Vitamin B-1 Take 1 tablet (100 mg total) by mouth daily. Start taking on: December 17, 2023   topiramate  50 MG tablet Commonly known as: TOPAMAX  Take 1 tablet by  mouth twice daily               Discharge Care Instructions  (From admission, onward)           Start     Ordered   12/16/23 0000  Discharge wound care:       Comments: See Sx rec   12/16/23 1152            Discharge Exam: Filed Weights   12/12/23 0407 12/13/23 0423 12/16/23 0515  Weight: 70.9 kg 73.8 kg 73.3 kg   General; NAD  Condition at discharge: stable  The results of significant diagnostics from this hospitalization (including imaging, microbiology, ancillary and laboratory) are listed below for reference.   Imaging Studies: ECHOCARDIOGRAM COMPLETE Result Date: 12/11/2023    ECHOCARDIOGRAM REPORT   Patient Name:   NEILAN RIZZO Date of Exam: 12/11/2023 Medical Rec #:  [id number]    Height:       67.0 in Accession #:    [id number]   Weight:  156.1 lb Date of Birth:  1956-02-12    BSA:          1.820 m Patient Age:    68 years     BP:           134/84 mmHg Patient Gender: M            HR:           44 bpm. Exam Location:  Inpatient Procedure: 2D Echo, Color Doppler and Cardiac Doppler (Both Spectral and Color            Flow Doppler were utilized during procedure). Indications:    Stroke i63.9  History:        Patient has prior history of Echocardiogram examinations, most                 recent 03/11/2021. Risk Factors:Hypertension, Dyslipidemia,                 Sleep Apnea, ETOH and Diabetes.  Sonographer:    Damien Senior RDCS Referring Phys: [id number] LEITA SHAUNNA GASKINS  Sonographer Comments: Scanned supine on artificial respirator. IMPRESSIONS  1. Left ventricular ejection fraction, by estimation, is 60 to 65%. The left ventricle has normal function. The left ventricle has no regional wall motion abnormalities. There is moderate asymmetric left ventricular hypertrophy. Left ventricular diastolic parameters were normal.  2. Right ventricular systolic function is normal. The right ventricular size is mildly enlarged.  3. The mitral valve is normal in structure. Trivial  mitral valve regurgitation.  4. The aortic valve is tricuspid. Aortic valve regurgitation is not visualized.  5. The inferior vena cava is normal in size with greater than 50% respiratory variability, suggesting right atrial pressure of 3 mmHg. FINDINGS  Left Ventricle: Left ventricular ejection fraction, by estimation, is 60 to 65%. The left ventricle has normal function. The left ventricle has no regional wall motion abnormalities. The left ventricular internal cavity size was normal in size. There is  moderate asymmetric left ventricular hypertrophy. Left ventricular diastolic parameters were normal. Right Ventricle: The right ventricular size is mildly enlarged. Right vetricular wall thickness was not assessed. Right ventricular systolic function is normal. Left Atrium: Left atrial size was normal in size. Right Atrium: Right atrial size was normal in size. Pericardium: There is no evidence of pericardial effusion. Mitral Valve: The mitral valve is normal in structure. Trivial mitral valve regurgitation. Tricuspid Valve: The tricuspid valve is normal in structure. Tricuspid valve regurgitation is mild. Aortic Valve: The aortic valve is tricuspid. Aortic valve regurgitation is not visualized. Pulmonic Valve: The pulmonic valve was normal in structure. Pulmonic valve regurgitation is not visualized. Aorta: The aortic root and ascending aorta are structurally normal, with no evidence of dilitation. Venous: The inferior vena cava is normal in size with greater than 50% respiratory variability, suggesting right atrial pressure of 3 mmHg. IAS/Shunts: No atrial level shunt detected by color flow Doppler.  LEFT VENTRICLE PLAX 2D LVIDd:         3.90 cm   Diastology LVIDs:         2.40 cm   LV e' medial:    7.30 cm/s LV PW:         1.00 cm   LV E/e' medial:  10.6 LV IVS:        1.65 cm   LV e' lateral:   10.90 cm/s LVOT diam:     1.90 cm   LV E/e' lateral: 7.1 LV SV:  48 LV SV Index:   26 LVOT Area:     2.84 cm   RIGHT VENTRICLE RV S prime:     12.30 cm/s TAPSE (M-mode): 2.2 cm LEFT ATRIUM             Index        RIGHT ATRIUM           Index LA diam:        2.80 cm 1.54 cm/m   RA Area:     19.00 cm LA Vol (A2C):   26.4 ml 14.51 ml/m  RA Volume:   53.20 ml  29.23 ml/m LA Vol (A4C):   56.8 ml 31.21 ml/m LA Biplane Vol: 42.1 ml 23.13 ml/m  AORTIC VALVE LVOT Vmax:   78.30 cm/s LVOT Vmean:  50.000 cm/s LVOT VTI:    0.169 m  AORTA Ao Root diam: 3.10 cm Ao Asc diam:  3.80 cm MITRAL VALVE               TRICUSPID VALVE MV Area (PHT): 3.77 cm    TR Peak grad:   20.8 mmHg MV Decel Time: 201 msec    TR Vmax:        228.00 cm/s MV E velocity: 77.10 cm/s MV A velocity: 70.50 cm/s  SHUNTS MV E/A ratio:  1.09        Systemic VTI:  0.17 m                            Systemic Diam: 1.90 cm Vina Gull MD Electronically signed by Vina Gull MD Signature Date/Time: 12/11/2023/2:50:56 PM    Final    MR BRAIN W WO CONTRAST Result Date: 12/11/2023 CLINICAL DATA:  Seizure, new-onset, no history of trauma EXAM: MRI HEAD WITHOUT AND WITH CONTRAST TECHNIQUE: Multiplanar, multiecho pulse sequences of the brain and surrounding structures were obtained without and with intravenous contrast. CONTRAST:  7.5mL GADAVIST  GADOBUTROL  1 MMOL/ML IV SOLN COMPARISON:  CT the head dated December 10, 2023 and MRI of the head dated March 11, 2021. FINDINGS: Brain: There is generalized cerebral atrophy present and there is moderately advanced periventricular and deep cerebral white matter disease. There are patchy areas of increased T2 signal within the pons. There are focal areas of blooming artifact present within the thalami bilaterally. The lesion on the right has developed in the interim. There is no restricted diffusion to indicate acute or recent infarction. There is no abnormal parenchymal or meningeal enhancement. Vascular: Normal vascular flow voids. The venous sinuses appear to be patent. Skull and upper cervical spine: Normal marrow signal. No osseous  lesions. There is a peripherally enhancing fluid collection within the subcutaneous fat of the occipital scalp just to the right of midline, measuring approximately 3.6 x 1.9 x 2.0 cm. Sinuses/Orbits: Clear paranasal sinuses. Status post bilateral lens surgery. Other: None. IMPRESSION: 1. There are focal areas of hemosiderin staining present within the thalami bilaterally, with the lesion on the right having developed in the interim. 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 2. 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 3. Peripherally enhancing fluid collection in the right posterior scalp, which could represent a seroma or abscess. Recommend clinical correlation. Electronically Signed   By: Evalene Coho M.D.   On: 12/11/2023 10:34   Overnight EEG with video Result Date: 12/11/2023 Shelton Arlin KIDD, MD     12/12/2023  6:51 AM Patient Name: OMER PUCCINELLI MRN: [id number] Epilepsy Attending: Priyanka  MALVA Krebs Referring Physician/Provider: Remi Pippin, NP Duration: 12/10/2023 1534 to 12/11/2023 1723 Patient history: 68 y.o. male with hx of HTN, Dm, CVA, HTN, Seizures, and GERD who initially presented to [facility] ED via EMS who were initially called out for left side weakness and slurred speech, however per EMS when they moved him to their stretcher he began having tonic clonic seizure like activity with decorticate posturing. EEG to evaluate for seizure Level of alertness: Awake, asleep AEDs during EEG study: LEV, Versed , Propofol  Technical aspects: This EEG study was done with scalp electrodes positioned according to the 10-20 International system of electrode placement. Electrical activity was reviewed with band pass filter of 1-70Hz , sensitivity of 7 uV/mm, display speed of 55mm/sec with a 60Hz  notched filter applied as appropriate. EEG data were recorded continuously and digitally stored.  Video monitoring was available and reviewed as appropriate. Description: EEG initially showed continuous  generalized 3 to 6 Hz theta-delta slowing with overriding 13 to 15 Hz beta activity. Gradually as sedation was weaned, EEG improved and showed The posterior dominant rhythm consists of 9-10 Hz activity of moderate voltage (25-35 uV) seen predominantly in posterior head regions, symmetric and reactive to eye opening and eye closing. Sleep was characterized by vertex waves, sleep spindles (12 to 14 Hz), maximal frontocentral region. Intermittent generalized 3-6hz  theta-delta slowing was noted. Hyperventilation and photic stimulation were not performed.   EEG was disconnected on 12/11/2023 between 0812 to 0942 for mri brain. After 1723, EEG ws not interpretable due to significant electrode artifact. ABNORMALITY - Continuous slow, generalized IMPRESSION: This study was initially suggestive of moderate to severe diffuse encephalopathy, likely related to sedation. Gradually as sedation was weaned, EEG improved and was suggestive of mild to moderate diffuse encephalopathy. No seizures or epileptiform discharges were seen throughout the recording. Arlin MALVA Krebs   DG Chest Port 1 View Result Date: 12/11/2023 CLINICAL DATA:  Respiratory failure, endotracheal tube. EXAM: PORTABLE CHEST 1 VIEW COMPARISON:  12/10/2023. FINDINGS: Endotracheal tube terminates 4.4 cm above the carina. Nasogastric tube is followed into the stomach with the tip projecting beyond the inferior margin of the image. Heart size stable. Lungs are somewhat low in volume with minimal streaky atelectasis in the lung bases. No airspace consolidation or pleural fluid. IMPRESSION: Somewhat low lung volumes with minimal bibasilar atelectasis. Electronically Signed   By: Newell Eke M.D.   On: 12/11/2023 08:28   CT HEAD CODE STROKE WO CONTRAST Result Date: 12/10/2023 CLINICAL DATA:  Code stroke. Neuro deficit, acute, stroke suspected. Altered mental status. On head. EXAM: CT ANGIOGRAPHY HEAD AND NECK TECHNIQUE: Multidetector CT imaging of the head and  neck was performed using the standard protocol during bolus administration of intravenous contrast. Multiplanar CT image reconstructions and MIPs were obtained to evaluate the vascular anatomy. Carotid stenosis measurements (when applicable) are obtained utilizing NASCET criteria, using the distal internal carotid diameter as the denominator. RADIATION DOSE REDUCTION: This exam was performed according to the departmental dose-optimization program which includes automated exposure control, adjustment of the mA and/or kV according to patient size and/or use of iterative reconstruction technique. CONTRAST:  75mL OMNIPAQUE  IOHEXOL  350 MG/ML SOLN COMPARISON:  Brain MRI 03/11/2021. MRA head 03/11/2021. MRA neck 03/11/2021. FINDINGS: CT HEAD FINDINGS Brain: Generalized cerebral atrophy. Lacunar infarcts within the bilateral thalami, new from the prior head CT of 03/10/2021 but otherwise age-indeterminate. Patchy and ill-defined hypoattenuation within the cerebral white matter, nonspecific but compatible with mild chronic small vessel ischemic disease. There is no acute intracranial hemorrhage. No  demarcated cortical infarct. No extra-axial fluid collection. No evidence of an intracranial mass. No midline shift. Vascular: No hyperdense vessel. Skull: No calvarial fracture or aggressive osseous lesion. Sinuses/Orbits: No mass or acute finding within the imaged orbits. Mild mucosal thickening within the bilateral ethmoid sinuses. Other: Chronic left nasal bone fracture deformity. Nonspecific 3.4 x 1.8 cm ovoid cutaneous/subcutaneous lesion within the suboccipital scalp just to the right of midline. (Series 2, image 10). ASPECTS (Alberta Stroke Program Early CT Score) - Ganglionic level infarction (caudate, lentiform nuclei, internal capsule, insula, M1-M3 cortex): 7 - Supraganglionic infarction (M4-M6 cortex): 3 Total score (0-10 with 10 being normal): 10 Review of the MIP images confirms the above findings These results were  called by telephone at the time of interpretation on 12/10/2023 at 9:12 am to provider [facility] , who verbally acknowledged these results. CTA NECK FINDINGS Aortic arch: Standard aortic branching. The visible portions of the thoracic aorta are normal in caliber. No hemodynamically significant innominate or proximal subclavian artery stenosis. Right carotid system: CCA and ICA patent within the neck without stenosis. Minimal atherosclerotic plaque within the proximal ICA. Left carotid system: CCA and ICA patent within the neck without measurable stenosis. Mild atherosclerotic plaque within the proximal ICA. 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 In retrospect, this was present on the prior MRA neck of 03/11/2021 and is unchanged in size since that time. Vertebral arteries: Codominant and patent within the neck without stenosis or significant atherosclerotic disease. Skeleton: No acute fracture or aggressive osseous lesion. Cervical spondylosis. Multilevel ventral osteophytes at the C2-C3 through T1-T2 levels. This includes bulky bridging ventral osteophytes at C4-C5, C5-C6, C6-C7 and C7-T1. Other neck: No neck mass or cervical lymphadenopathy. Upper chest: No consolidation within the imaged lung apices. Partially visualized support tubes. Review of the MIP images confirms the above findings CTA HEAD FINDINGS Anterior circulation: The intracranial internal carotid arteries are patent. The M1 middle cerebral arteries are patent. No M2 proximal branch occlusion or high-grade proximal stenosis. The anterior cerebral arteries are patent. No intracranial aneurysm is identified. Posterior circulation: The intracranial vertebral arteries are patent. The basilar artery is patent. The posterior cerebral arteries are patent. Severe stenosis within the right posterior cerebral artery P2 segment, new from  the prior MRA of 03/11/2021 (series 9, image 23). Severe, near-occlusive stenosis within the left posterior cerebral artery at the P2/P3 junction, also new from the prior MRA (series 11, image 28). Posterior communicating arteries are diminutive or absent, bilaterally. Venous sinuses: Limited assessment for dural venous sinus thrombosis due to contrast timing. Anatomic variants: As described. Review of the MIP images confirms the above findings CTA head impressions #1 and #2 were called by telephone at the time of interpretation on 12/10/2023 at 9:32 am to provider [facility] , who verbally acknowledged these results. IMPRESSION: Non-contrast head CT: 1. Lacunar infarcts within the bilateral thalami which are new from the prior head CT of 03/10/2021, but otherwise age-indeterminate. Consider a brain MRI for further evaluation. 2. Background parenchymal atrophy and chronic small vessel ischemic disease. 3. Nonspecific 3.4 x 1.8 cm ovoid cutaneous/subcutaneous lesion within the suboccipital scalp just to the right of midline. Direct examination recommended. CTA neck: 1. The common carotid and internal carotid arteries are patent within the neck without measurable stenosis. Mild atherosclerotic plaque bilaterally, as described. 2. 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 3. Vertebral arteries patent within the neck without stenosis or significant atherosclerotic disease. CT head: 1. 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 2. Severe stenosis within the right posterior cerebral artery P2 segment, new from the prior MRA. Electronically Signed   By: Rockey Childs D.O.   On: 12/10/2023 09:34   CT ANGIO HEAD NECK W WO CM Result Date: 12/10/2023 CLINICAL DATA:  Code stroke. Neuro deficit, acute, stroke suspected. Altered mental status. On head.  EXAM: CT ANGIOGRAPHY HEAD AND NECK TECHNIQUE: Multidetector CT imaging of the head and neck was performed using the standard protocol during bolus administration of intravenous contrast. Multiplanar CT image reconstructions and MIPs were obtained to evaluate the vascular anatomy. Carotid stenosis measurements (when applicable) are obtained utilizing NASCET criteria, using the distal internal carotid diameter as the denominator. RADIATION DOSE REDUCTION: This exam was performed according to the departmental dose-optimization program which includes automated exposure control, adjustment of the mA and/or kV according to patient size and/or use of iterative reconstruction technique. CONTRAST:  75mL OMNIPAQUE  IOHEXOL  350 MG/ML SOLN COMPARISON:  Brain MRI 03/11/2021. MRA head 03/11/2021. MRA neck 03/11/2021. FINDINGS: CT HEAD FINDINGS Brain: Generalized cerebral atrophy. Lacunar infarcts within the bilateral thalami, new from the prior head CT of 03/10/2021 but otherwise age-indeterminate. Patchy and ill-defined hypoattenuation within the cerebral white matter, nonspecific but compatible with mild chronic small vessel ischemic disease. There is no acute intracranial hemorrhage. No demarcated cortical infarct. No extra-axial fluid collection. No evidence of an intracranial mass. No midline shift. Vascular: No hyperdense vessel. Skull: No calvarial fracture or aggressive osseous lesion. Sinuses/Orbits: No mass or acute finding within the imaged orbits. Mild mucosal thickening within the bilateral ethmoid sinuses. Other: Chronic left nasal bone fracture deformity. Nonspecific 3.4 x 1.8 cm ovoid cutaneous/subcutaneous lesion within the suboccipital scalp just to the right of midline. (Series 2, image 10). ASPECTS (Alberta Stroke Program Early CT Score) - Ganglionic level infarction (caudate, lentiform nuclei, internal capsule, insula, M1-M3 cortex): 7 - Supraganglionic infarction (M4-M6 cortex): 3 Total score (0-10 with 10  being normal): 10 Review of the MIP images confirms the above findings These results were called by telephone at the time of interpretation on 12/10/2023 at 9:12 am to provider [facility] , who verbally acknowledged these results. CTA NECK FINDINGS Aortic arch: Standard aortic branching. The visible portions of the thoracic aorta are normal in caliber. No hemodynamically significant innominate or proximal subclavian artery stenosis. Right carotid system: CCA and ICA patent within the neck without stenosis. Minimal atherosclerotic plaque within the proximal ICA. Left carotid system: CCA and ICA patent within the neck without measurable stenosis. Mild atherosclerotic plaque within the proximal ICA. 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 In retrospect, this was present on the prior MRA neck of 03/11/2021 and is unchanged in size since that time. Vertebral arteries: Codominant and patent within the neck without stenosis or significant atherosclerotic disease. Skeleton: No acute fracture or aggressive osseous lesion. Cervical spondylosis. Multilevel ventral osteophytes at the C2-C3 through T1-T2 levels. This includes bulky bridging ventral osteophytes at C4-C5, C5-C6, C6-C7 and C7-T1. Other neck: No neck mass or cervical lymphadenopathy. Upper chest: No consolidation within the imaged lung apices. Partially visualized support tubes. Review of the MIP images confirms the above findings CTA HEAD  FINDINGS Anterior circulation: The intracranial internal carotid arteries are patent. The M1 middle cerebral arteries are patent. No M2 proximal branch occlusion or high-grade proximal stenosis. The anterior cerebral arteries are patent. No intracranial aneurysm is identified. Posterior circulation: The intracranial vertebral arteries are patent. The basilar artery is patent. The posterior cerebral arteries are  patent. 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 Severe, near-occlusive stenosis within the left posterior cerebral artery at the P2/P3 junction, also new from the prior MRA (series 11, image 28). Posterior communicating arteries are diminutive or absent, bilaterally. Venous sinuses: Limited assessment for dural venous sinus thrombosis due to contrast timing. Anatomic variants: As described. Review of the MIP images confirms the above findings CTA head impressions #1 and #2 were called by telephone at the time of interpretation on 12/10/2023 at 9:32 am to provider [facility] , who verbally acknowledged these results. IMPRESSION: Non-contrast head CT: 1. Lacunar infarcts within the bilateral thalami which are new from the prior head CT of 03/10/2021, but otherwise age-indeterminate. Consider a brain MRI for further evaluation. 2. Background parenchymal atrophy and chronic small vessel ischemic disease. 3. Nonspecific 3.4 x 1.8 cm ovoid cutaneous/subcutaneous lesion within the suboccipital scalp just to the right of midline. Direct examination recommended. CTA neck: 1. The common carotid and internal carotid arteries are patent within the neck without measurable stenosis. Mild atherosclerotic plaque bilaterally, as described. 2. 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 3. Vertebral arteries patent within the neck without stenosis or significant atherosclerotic disease. CT head: 1. 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 2. Severe stenosis within the right posterior cerebral artery P2 segment, new from the prior MRA. Electronically Signed   By: Rockey Childs D.O.   On: 12/10/2023 09:34   DG Chest Portable 1 View Result Date: 12/10/2023 CLINICAL DATA:   Endotracheal tube placement. EXAM: PORTABLE CHEST 1 VIEW COMPARISON:  None recent.  Radiographs 01/24/2013. FINDINGS: 0826 hours. Tip of the endotracheal tube is approximately 3.2 cm above the carina. Enteric tube projects below the diaphragm, tip not visualized. The heart size and mediastinal contours are stable. The lungs appear clear. The right costophrenic angle is incompletely visualized. No evidence of pneumothorax or large pleural effusion. Mild degenerative changes of the spine without evidence of acute osseous abnormality. IMPRESSION: Satisfactory position of endotracheal and enteric tubes. No evidence of acute cardiopulmonary process. Electronically Signed   By: Elsie Perone M.D.   On: 12/10/2023 08:47    Microbiology: Results for orders placed or performed during the hospital encounter of 12/10/23  MRSA Next Gen by PCR, Nasal     Status: None   Collection Time: 12/10/23  2:23 PM   Specimen: Nasal Mucosa; Nasal Swab  Result Value Ref Range Status   MRSA by PCR Next Gen NOT DETECTED NOT DETECTED Final    Comment: (NOTE) The GeneXpert MRSA Assay (FDA approved for NASAL specimens only), is one component of a comprehensive MRSA colonization surveillance program. It is not intended to diagnose MRSA infection nor to guide or monitor treatment for MRSA infections. Test performance is not FDA approved in patients less than 83 years old. Performed at [facility], [address] [place], [place] [address]   Culture, Respiratory w Gram Stain  Status: None   Collection Time: 12/10/23  2:46 PM   Specimen: Tracheal Aspirate; Respiratory  Result Value Ref Range Status   Specimen Description TRACHEAL ASPIRATE  Final   Special Requests NONE  Final   Gram Stain NO WBC SEEN FEW GRAM POSITIVE COCCI IN PAIRS   Final   Culture   Final    MODERATE Normal respiratory flora-no Staph aureus or Pseudomonas seen Performed at [facility], [address], [place], [place] [address]     Report Status 12/12/2023 FINAL  Final  Aerobic/Anaerobic Culture w Gram Stain (surgical/deep wound)     Status: None   Collection Time: 12/10/23  6:23 PM   Specimen: Abscess  Result Value Ref Range Status   Specimen Description ABSCESS  Final   Special Requests NONE  Final   Gram Stain   Final    ABUNDANT WBC PRESENT, PREDOMINANTLY PMN MODERATE GRAM POSITIVE COCCI IN PAIRS AND CHAINS    Culture   Final    ABUNDANT STAPHYLOCOCCUS AUREUS MODERATE STAPHYLOCOCCUS LUGDUNENSIS MODERATE GROUP A STREP (S.PYOGENES) ISOLATED Beta hemolytic streptococci are predictably susceptible to penicillin and other beta lactams. Susceptibility testing not routinely performed. NO ANAEROBES ISOLATED Performed at [facility], [address], [place], [place] [address]    Report Status 12/15/2023 FINAL  Final   Organism ID, Bacteria STAPHYLOCOCCUS AUREUS  Final   Organism ID, Bacteria STAPHYLOCOCCUS LUGDUNENSIS  Final      Susceptibility   Staphylococcus aureus - MIC*    CIPROFLOXACIN >=8 RESISTANT Resistant     ERYTHROMYCIN >=8 RESISTANT Resistant     GENTAMICIN <=0.5 SENSITIVE Sensitive     OXACILLIN 0.5 SENSITIVE Sensitive     TETRACYCLINE >=16 RESISTANT Resistant     VANCOMYCIN  1 SENSITIVE Sensitive     TRIMETH/SULFA >=320 RESISTANT Resistant     CLINDAMYCIN <=0.25 SENSITIVE Sensitive     RIFAMPIN <=0.5 SENSITIVE Sensitive     Inducible Clindamycin NEGATIVE Sensitive     LINEZOLID  2 SENSITIVE Sensitive     * ABUNDANT STAPHYLOCOCCUS AUREUS   Staphylococcus lugdunensis - MIC*    CIPROFLOXACIN <=0.5 SENSITIVE Sensitive     ERYTHROMYCIN <=0.25 SENSITIVE Sensitive     GENTAMICIN <=0.5 SENSITIVE Sensitive     OXACILLIN 2 SENSITIVE Sensitive     TETRACYCLINE <=1 SENSITIVE Sensitive     VANCOMYCIN  <=0.5 SENSITIVE Sensitive     TRIMETH/SULFA <=10 SENSITIVE Sensitive     CLINDAMYCIN <=0.25 SENSITIVE Sensitive     RIFAMPIN <=0.5 SENSITIVE Sensitive     Inducible Clindamycin NEGATIVE Sensitive      * MODERATE STAPHYLOCOCCUS LUGDUNENSIS    Labs: CBC: Recent Labs  Lab 12/10/23 0810 12/10/23 1510 12/11/23 0423 12/12/23 0518 12/14/23 0746 12/15/23 0910 12/16/23 0542  WBC 8.6   < > 12.6* 7.5 6.5 5.5 6.0  NEUTROABS 5.1  --   --   --   --   --   --   HGB 17.9*   < > 17.1* 15.3 15.7 17.3* 16.4  HCT 54.4*   < > 50.1 45.9 45.2 49.9 47.7  MCV 97.7   < > 94.2 94.6 92.8 92.6 93.0  PLT 120*   < > 161 123* 144* 159 142*   < > = values in this interval not displayed.   Basic Metabolic Panel: Recent Labs  Lab 12/11/23 0423 12/12/23 0518 12/14/23 0746 12/15/23 0910 12/16/23 0542  NA 137 137 139 137 140  K 3.8 3.8 3.3* 3.5 3.4*  CL 100 100 105 107  108  CO2 24 24 23  20* 23  GLUCOSE 156* 101* 98 97 103*  BUN 9 10 8 11 12   CREATININE 1.52* 1.18 1.25* 1.30* 1.23  CALCIUM  8.8* 8.5* 9.2 9.5 9.4  MG 2.1 2.1  --   --   --   PHOS 3.2 2.9 2.7 3.0 2.5   Liver Function Tests: Recent Labs  Lab 12/10/23 0810 12/14/23 0746 12/15/23 0910 12/16/23 0542  AST 40  --   --   --   ALT 15  --   --   --   ALKPHOS 126  --   --   --   BILITOT 0.9  --   --   --   PROT 8.1  --   --   --   ALBUMIN 3.3* 2.7* 2.9* 2.9*   CBG: Recent Labs  Lab 12/15/23 1213 12/15/23 1605 12/16/23 0005 12/16/23 0515 12/16/23 0813  GLUCAP 138* 125* 112* 112* 121*    Discharge time spent: greater than 30 minutes.  Signed: Owen DELENA Lore, MD [facility] 12/16/2023

## 2023-12-16 NOTE — TOC Progression Note (Addendum)
 Transition of Care [facility]) - Progression Note    Patient Details  Name: Philip [name] MRN: [id number] Date of Birth: [birth year]  Transition of Care [facility] CM/SW Contact  Wyllow Seigler LITTIE Moose, LCSW Phone Number: 12/16/2023, 12:09 PM  Clinical Narrative:    CSW provided pt and his sisrer, [name] with SNF bed offers. [name] said she would have an answer this afternoon.  3:33 PM- Pt sister, [name], called CSW with SNF choice. CSW has initiated serbia for [name]. [name] can accept pt pending insurance approval.    Expected Discharge Plan: Skilled Nursing Facility Barriers to Discharge: Continued Medical Work up               Expected Discharge Plan and Services       Living arrangements for the past 2 months: Single Family Home Expected Discharge Date: 12/16/23                                     Social Drivers of Health (SDOH) Interventions SDOH Screenings   Food Insecurity: No Food Insecurity (12/07/2023)  Housing: Unknown (12/07/2023)  Transportation Needs: No Transportation Needs (12/07/2023)  Utilities: Not At Risk (12/07/2023)  Alcohol Screen: Low Risk  (12/07/2023)  Depression (PHQ2-9): Low Risk  (12/07/2023)  Financial Resource Strain: Low Risk  (12/07/2023)  Physical Activity: Insufficiently Active (12/07/2023)  Social Connections: Socially Isolated (12/07/2023)  Stress: No Stress Concern Present (12/07/2023)  Tobacco Use: High Risk (12/11/2023)  Health Literacy: Adequate Health Literacy (12/07/2023)    Readmission Risk Interventions     No data to display

## 2023-12-17 DIAGNOSIS — G9341 Metabolic encephalopathy: Secondary | ICD-10-CM | POA: Diagnosis not present

## 2023-12-17 DIAGNOSIS — Z743 Need for continuous supervision: Secondary | ICD-10-CM | POA: Diagnosis not present

## 2023-12-17 DIAGNOSIS — J9601 Acute respiratory failure with hypoxia: Secondary | ICD-10-CM | POA: Diagnosis not present

## 2023-12-17 DIAGNOSIS — K5900 Constipation, unspecified: Secondary | ICD-10-CM | POA: Diagnosis not present

## 2023-12-17 DIAGNOSIS — B9561 Methicillin susceptible Staphylococcus aureus infection as the cause of diseases classified elsewhere: Secondary | ICD-10-CM | POA: Diagnosis not present

## 2023-12-17 DIAGNOSIS — L02811 Cutaneous abscess of head [any part, except face]: Secondary | ICD-10-CM | POA: Diagnosis not present

## 2023-12-17 DIAGNOSIS — N179 Acute kidney failure, unspecified: Secondary | ICD-10-CM | POA: Diagnosis not present

## 2023-12-17 DIAGNOSIS — Z7401 Bed confinement status: Secondary | ICD-10-CM | POA: Diagnosis not present

## 2023-12-17 DIAGNOSIS — K219 Gastro-esophageal reflux disease without esophagitis: Secondary | ICD-10-CM | POA: Diagnosis not present

## 2023-12-17 DIAGNOSIS — R569 Unspecified convulsions: Secondary | ICD-10-CM | POA: Diagnosis not present

## 2023-12-17 DIAGNOSIS — E617 Deficiency of multiple nutrient elements: Secondary | ICD-10-CM | POA: Diagnosis not present

## 2023-12-17 DIAGNOSIS — R278 Other lack of coordination: Secondary | ICD-10-CM | POA: Diagnosis not present

## 2023-12-17 DIAGNOSIS — E539 Vitamin B deficiency, unspecified: Secondary | ICD-10-CM | POA: Diagnosis not present

## 2023-12-17 DIAGNOSIS — M792 Neuralgia and neuritis, unspecified: Secondary | ICD-10-CM | POA: Diagnosis not present

## 2023-12-17 DIAGNOSIS — G4733 Obstructive sleep apnea (adult) (pediatric): Secondary | ICD-10-CM | POA: Diagnosis not present

## 2023-12-17 DIAGNOSIS — G4489 Other headache syndrome: Secondary | ICD-10-CM | POA: Diagnosis not present

## 2023-12-17 DIAGNOSIS — R9082 White matter disease, unspecified: Secondary | ICD-10-CM | POA: Diagnosis not present

## 2023-12-17 DIAGNOSIS — M6259 Muscle wasting and atrophy, not elsewhere classified, multiple sites: Secondary | ICD-10-CM | POA: Diagnosis not present

## 2023-12-17 DIAGNOSIS — E785 Hyperlipidemia, unspecified: Secondary | ICD-10-CM | POA: Diagnosis not present

## 2023-12-17 DIAGNOSIS — Z8673 Personal history of transient ischemic attack (TIA), and cerebral infarction without residual deficits: Secondary | ICD-10-CM | POA: Diagnosis not present

## 2023-12-17 DIAGNOSIS — I10 Essential (primary) hypertension: Secondary | ICD-10-CM | POA: Diagnosis not present

## 2023-12-17 DIAGNOSIS — I169 Hypertensive crisis, unspecified: Secondary | ICD-10-CM | POA: Diagnosis not present

## 2023-12-17 DIAGNOSIS — M6281 Muscle weakness (generalized): Secondary | ICD-10-CM | POA: Diagnosis not present

## 2023-12-17 LAB — BASIC METABOLIC PANEL WITH GFR
Anion gap: 10 (ref 5–15)
BUN: 14 mg/dL (ref 8–23)
CO2: 20 mmol/L — ABNORMAL LOW (ref 22–32)
Calcium: 9.4 mg/dL (ref 8.9–10.3)
Chloride: 110 mmol/L (ref 98–111)
Creatinine, Ser: 1.33 mg/dL — ABNORMAL HIGH (ref 0.61–1.24)
GFR, Estimated: 58 mL/min — ABNORMAL LOW (ref >60)
Glucose, Bld: 186 mg/dL — ABNORMAL HIGH (ref 70–99)
Potassium: 3.6 mmol/L (ref 3.5–5.1)
Sodium: 140 mmol/L (ref 135–145)

## 2023-12-17 LAB — GLUCOSE, CAPILLARY
Glucose-Capillary: 142 mg/dL — ABNORMAL HIGH (ref 70–99)
Glucose-Capillary: 219 mg/dL — ABNORMAL HIGH (ref 70–99)
Glucose-Capillary: 71 mg/dL (ref 70–99)
Glucose-Capillary: 118 mg/dL — ABNORMAL HIGH (ref 70–99)

## 2023-12-17 LAB — TRIGLYCERIDES: Triglycerides: 166 mg/dL — ABNORMAL HIGH (ref <150)

## 2023-12-17 NOTE — Discharge Summary (Signed)
 Physician Discharge Summary   Patient: Philip [name] MRN: [id number] DOB: 1955/06/27  Admit date:     12/10/2023  Discharge date: 12/17/23  Discharge Physician: Owen DELENA Lore   PCP: Zollie Lowers, MD   Recommendations at discharge:    Needs follow up with neurology post seizure Needs Follow up for further management of BP  Discharge Diagnoses: Principal Problem:   Hypertensive crisis Active Problems:   Seizure [facility])  Resolved Problems:   * No resolved hospital problems. *  Hospital Course: 68 year old past medical history significant for uncontrolled hypertension, diabetes, GERD, OSA, prior CVA presented to [facility] ER on 7/17 with acute onset left-sided weakness.  He called 911.  Patient received 5 mg of midazolam  and was obtunded on arrival to the ER and was intubated.  Patient's blood pressure was 250/130, improved with labetalol  and propofol .  He was given Keppra  and neurology was also consulted for EEG.  Patient was admitted to the ICU by PCCM.  Patient was transferred to [facility] care on 12/13/2023.   Significant hospital events: 7/17 ADMITTED. Hypertensive crisis, seizure, [facility]G & MRI fine, weaned off propofol , extubated. Scalp abscess lanced.     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  No new strokes. - Patient has been seen by neurology, recommended cocaine cessation, BP control, goal BP less than 160 - he has been weaned off of Precedex . -Continue with Hydralazine  75 mg 3 times daily, continue amlodipine  10 mg daily, losartan  25 mg daily - Hydralazine  IV as needed with parameters.  Beta-blocker discontinued due to cocaine     Breakthrough seizure witnessed in the setting of hypertensive emergency  -- EEG 7/18 showed moderate to severe diffuse encephalopathy.  No seizure or epileptiform discharges seen - Per neurology, likely had provoked seizures in the setting of cocaine use, HTN emergency however given prior history of seizures, left-sided weakness, recommended patient stay on antiseizure meds - Resumed gabapentin , Topamax  50 mg twice daily, continue Keppra  500 mg twice daily - Outpatient follow-up with neurology     Acute metabolic encephalopathy, acute alcohol withdrawal -- Likely due to #1 and #2, cocaine abuse - Mental status worsening, confused, RN confirmed from his sister yesterday that he drinks alcohol daily  more than water - Continue CIWA protocol with Ativan , thiamine , folate, MVI -stable for transfer  Acute hypoxic respiratory failure with hypoxia requiring mechanical ventilation due to seizure, aspiration event: --Pulmonary hygiene - Stable, on room air    AKI -Stable.   Hyperglycemia no evidence of diabetes 1Ac: 5.1 -A1c: 5.1   Posterior right scalp skin abscess: -- General Surgery following, I&D on 7/18  -cultures that showed Staph aureus, staph lugdunensis, continue vancomycin , can transition to oral linezolid  at discharge     Hypokalemia: -replete with oral potassium.     stable for transfer.        Consultants: CCM, neurology, sx Procedures performed: scal abscess drainage.  Disposition: Skilled nursing facility Diet recommendation:  Discharge Diet Orders (From admission, onward)     Start     Ordered   12/16/23 0000  Diet - low sodium heart healthy        12/16/23 1152           Cardiac  diet DISCHARGE MEDICATION: Allergies as of 12/17/2023   No Known Allergies      Medication List     STOP taking these medications    metoprolol  succinate 50 MG 24 hr tablet Commonly known as: TOPROL -XL   sildenafil  20 MG tablet Commonly known as: REVATIO         TAKE these medications    amLODipine  10 MG tablet Commonly known as: NORVASC  Take 1 tablet (10 mg total) by mouth daily.   aspirin  81 MG chewable tablet Chew 1 tablet (81 mg total) by mouth daily.   atorvastatin  40 MG tablet Commonly known as: LIPITOR Take 1 tablet (40 mg total) by mouth daily. for cholesterol.   docusate 50 MG/5ML liquid Commonly known as: COLACE Take 10 mLs (100 mg total) by mouth 2 (two) times daily.   folic acid  1 MG tablet Commonly known as: FOLVITE  Take 1 tablet (1 mg total) by mouth daily.   gabapentin  300 MG capsule Commonly known as: NEURONTIN  Take 2 capsules (600 mg total) by mouth at bedtime.   hydrALAZINE  25 MG tablet Commonly known as: APRESOLINE  Take 3 tablets (75 mg total) by mouth 3 (three) times daily. What changed:  medication strength how much to take   levETIRAcetam  500 MG tablet Commonly known as: KEPPRA  Take 1 tablet (500 mg total) by mouth 2 (two) times daily.   linezolid  600 MG tablet Commonly known as: ZYVOX  Take 1 tablet (600 mg total) by mouth 2 (two) times daily for 5 days.   losartan  25 MG tablet Commonly known as: COZAAR  Take 1 tablet (25 mg total) by mouth daily.   multivitamin with minerals Tabs tablet Take 1 tablet by mouth daily.   nicotine  21 mg/24hr patch Commonly known as: NICODERM CQ  - dosed in mg/24 hours Place 1 patch (21 mg total) onto the skin daily.   pantoprazole  40 MG tablet Commonly known as: PROTONIX  Take 1 tablet (40 mg total) by mouth daily.   polyethylene glycol 17 g packet Commonly known as: MIRALAX  / GLYCOLAX  Take 17 g by mouth daily as needed for moderate constipation.   thiamine  100 MG tablet Commonly known as: Vitamin B-1 Take 1 tablet (100 mg total) by mouth daily.   topiramate  50 MG tablet Commonly known as: TOPAMAX  Take 1 tablet by mouth twice daily               Discharge Care Instructions  (From admission, onward)           Start     Ordered   12/16/23  0000  Discharge wound care:       Comments: See Sx rec   12/16/23 1152            Contact information for after-discharge care     Destination     [facility]   Service: Skilled Nursing Contact information: [address] [place]  [address] [phone]                    Discharge Exam: Fredricka Weights   12/13/23 0423 12/16/23 0515 12/17/23 0428  Weight: 73.8 kg 73.3 kg 72.7 kg   General; NAD  Condition at discharge: stable  The results of significant diagnostics from this hospitalization (including imaging, microbiology, ancillary and laboratory) are listed below for reference.   Imaging Studies: ECHOCARDIOGRAM COMPLETE Result Date: 12/11/2023    ECHOCARDIOGRAM REPORT   Patient Name:   TYMAR POLYAK Date of Exam: 12/11/2023 Medical Rec #:  [id number]  Height:       67.0 in Accession #:    [id number]   Weight:       156.1 lb Date of Birth:  13-Jun-1955    BSA:          1.820 m Patient Age:    68 years     BP:           134/84 mmHg Patient Gender: M            HR:           44 bpm. Exam Location:  Inpatient Procedure: 2D Echo, Color Doppler and Cardiac Doppler (Both Spectral and Color            Flow Doppler were utilized during procedure). Indications:    Stroke i63.9  History:        Patient has prior history of Echocardiogram examinations, most                 recent 03/11/2021. Risk Factors:Hypertension, Dyslipidemia,                 Sleep Apnea, ETOH and Diabetes.  Sonographer:    Damien Senior RDCS Referring Phys: [id number] LEITA SHAUNNA GASKINS  Sonographer Comments: Scanned supine on artificial respirator. IMPRESSIONS  1. Left ventricular ejection fraction, by estimation, is 60 to 65%. The left ventricle has normal function. The left ventricle has no regional wall motion abnormalities. There is moderate asymmetric left ventricular hypertrophy. Left ventricular diastolic parameters were normal.  2. Right ventricular systolic function  is normal. The right ventricular size is mildly enlarged.  3. The mitral valve is normal in structure. Trivial mitral valve regurgitation.  4. The aortic valve is tricuspid. Aortic valve regurgitation is not visualized.  5. The inferior vena cava is normal in size with greater than 50% respiratory variability, suggesting right atrial pressure of 3 mmHg. FINDINGS  Left Ventricle: Left ventricular ejection fraction, by estimation, is 60 to 65%. The left ventricle has normal function. The left ventricle has no regional wall motion abnormalities. The left ventricular internal cavity size was normal in size. There is  moderate asymmetric left ventricular hypertrophy. Left ventricular diastolic parameters were normal. Right Ventricle: The right ventricular size is mildly enlarged. Right vetricular wall thickness was not assessed. Right ventricular systolic function is normal. Left Atrium: Left atrial size was normal in size. Right Atrium: Right atrial size was normal in size. Pericardium: There is no evidence of pericardial effusion. Mitral Valve: The mitral valve is normal in structure. Trivial mitral valve regurgitation. Tricuspid Valve: The tricuspid valve is normal in structure. Tricuspid valve regurgitation is mild. Aortic Valve: The aortic valve is tricuspid. Aortic valve regurgitation is not visualized. Pulmonic Valve: The pulmonic valve was normal in structure. Pulmonic valve regurgitation is not visualized. Aorta: The aortic root and ascending aorta are structurally normal, with no evidence of dilitation. Venous: The inferior vena cava is normal in size with greater than 50% respiratory variability, suggesting right atrial pressure of 3 mmHg. IAS/Shunts: No atrial level shunt detected by color flow Doppler.  LEFT VENTRICLE PLAX 2D LVIDd:         3.90 cm   Diastology LVIDs:         2.40 cm   LV e' medial:    7.30 cm/s LV PW:         1.00 cm   LV E/e' medial:  10.6 LV IVS:        1.65 cm   LV e'  lateral:   10.90  cm/s LVOT diam:     1.90 cm   LV E/e' lateral: 7.1 LV SV:         48 LV SV Index:   26 LVOT Area:     2.84 cm  RIGHT VENTRICLE RV S prime:     12.30 cm/s TAPSE (M-mode): 2.2 cm LEFT ATRIUM             Index        RIGHT ATRIUM           Index LA diam:        2.80 cm 1.54 cm/m   RA Area:     19.00 cm LA Vol (A2C):   26.4 ml 14.51 ml/m  RA Volume:   53.20 ml  29.23 ml/m LA Vol (A4C):   56.8 ml 31.21 ml/m LA Biplane Vol: 42.1 ml 23.13 ml/m  AORTIC VALVE LVOT Vmax:   78.30 cm/s LVOT Vmean:  50.000 cm/s LVOT VTI:    0.169 m  AORTA Ao Root diam: 3.10 cm Ao Asc diam:  3.80 cm MITRAL VALVE               TRICUSPID VALVE MV Area (PHT): 3.77 cm    TR Peak grad:   20.8 mmHg MV Decel Time: 201 msec    TR Vmax:        228.00 cm/s MV E velocity: 77.10 cm/s MV A velocity: 70.50 cm/s  SHUNTS MV E/A ratio:  1.09        Systemic VTI:  0.17 m                            Systemic Diam: 1.90 cm Vina Gull MD Electronically signed by Vina Gull MD Signature Date/Time: 12/11/2023/2:50:56 PM    Final    MR BRAIN W WO CONTRAST Result Date: 12/11/2023 CLINICAL DATA:  Seizure, new-onset, no history of trauma EXAM: MRI HEAD WITHOUT AND WITH CONTRAST TECHNIQUE: Multiplanar, multiecho pulse sequences of the brain and surrounding structures were obtained without and with intravenous contrast. CONTRAST:  7.5mL GADAVIST  GADOBUTROL  1 MMOL/ML IV SOLN COMPARISON:  CT the head dated December 10, 2023 and MRI of the head dated March 11, 2021. FINDINGS: Brain: There is generalized cerebral atrophy present and there is moderately advanced periventricular and deep cerebral white matter disease. There are patchy areas of increased T2 signal within the pons. There are focal areas of blooming artifact present within the thalami bilaterally. The lesion on the right has developed in the interim. There is no restricted diffusion to indicate acute or recent infarction. There is no abnormal parenchymal or meningeal enhancement. Vascular: Normal vascular flow  voids. The venous sinuses appear to be patent. Skull and upper cervical spine: Normal marrow signal. No osseous lesions. There is a peripherally enhancing fluid collection within the subcutaneous fat of the occipital scalp just to the right of midline, measuring approximately 3.6 x 1.9 x 2.0 cm. Sinuses/Orbits: Clear paranasal sinuses. Status post bilateral lens surgery. Other: None. IMPRESSION: 1. There are focal areas of hemosiderin staining present within the thalami bilaterally, with the lesion on the right having developed in the interim. 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 2. 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 3. Peripherally enhancing fluid collection in the right posterior scalp, which could represent a seroma or abscess. Recommend clinical correlation. Electronically Signed   By: Evalene Coho M.D.   On: 12/11/2023 10:34  Overnight EEG with video Result Date: 12/11/2023 Shelton Arlin KIDD, MD     12/12/2023  6:51 AM Patient Name: KORAY SOTER MRN: [id number] Epilepsy Attending: Arlin KIDD Shelton Referring Physician/Provider: Remi Pippin, NP Duration: 12/10/2023 1534 to 12/11/2023 1723 Patient history: 68 y.o. male with hx of HTN, Dm, CVA, HTN, Seizures, and GERD who initially presented to [facility] ED via EMS who were initially called out for left side weakness and slurred speech, however per EMS when they moved him to their stretcher he began having tonic clonic seizure like activity with decorticate posturing. EEG to evaluate for seizure Level of alertness: Awake, asleep AEDs during EEG study: LEV, Versed , Propofol  Technical aspects: This EEG study was done with scalp electrodes positioned according to the 10-20 International system of electrode placement. Electrical activity was reviewed with band pass filter of 1-70Hz , sensitivity of 7 uV/mm, display speed of 29mm/sec with a 60Hz  notched filter applied as appropriate. EEG data were recorded continuously and digitally  stored.  Video monitoring was available and reviewed as appropriate. Description: EEG initially showed continuous generalized 3 to 6 Hz theta-delta slowing with overriding 13 to 15 Hz beta activity. Gradually as sedation was weaned, EEG improved and showed The posterior dominant rhythm consists of 9-10 Hz activity of moderate voltage (25-35 uV) seen predominantly in posterior head regions, symmetric and reactive to eye opening and eye closing. Sleep was characterized by vertex waves, sleep spindles (12 to 14 Hz), maximal frontocentral region. Intermittent generalized 3-6hz  theta-delta slowing was noted. Hyperventilation and photic stimulation were not performed.   EEG was disconnected on 12/11/2023 between 0812 to 0942 for mri brain. After 1723, EEG ws not interpretable due to significant electrode artifact. ABNORMALITY - Continuous slow, generalized IMPRESSION: This study was initially suggestive of moderate to severe diffuse encephalopathy, likely related to sedation. Gradually as sedation was weaned, EEG improved and was suggestive of mild to moderate diffuse encephalopathy. No seizures or epileptiform discharges were seen throughout the recording. Arlin KIDD Shelton   DG Chest Port 1 View Result Date: 12/11/2023 CLINICAL DATA:  Respiratory failure, endotracheal tube. EXAM: PORTABLE CHEST 1 VIEW COMPARISON:  12/10/2023. FINDINGS: Endotracheal tube terminates 4.4 cm above the carina. Nasogastric tube is followed into the stomach with the tip projecting beyond the inferior margin of the image. Heart size stable. Lungs are somewhat low in volume with minimal streaky atelectasis in the lung bases. No airspace consolidation or pleural fluid. IMPRESSION: Somewhat low lung volumes with minimal bibasilar atelectasis. Electronically Signed   By: Newell Eke M.D.   On: 12/11/2023 08:28   CT HEAD CODE STROKE WO CONTRAST Result Date: 12/10/2023 CLINICAL DATA:  Code stroke. Neuro deficit, acute, stroke suspected.  Altered mental status. On head. EXAM: CT ANGIOGRAPHY HEAD AND NECK TECHNIQUE: Multidetector CT imaging of the head and neck was performed using the standard protocol during bolus administration of intravenous contrast. Multiplanar CT image reconstructions and MIPs were obtained to evaluate the vascular anatomy. Carotid stenosis measurements (when applicable) are obtained utilizing NASCET criteria, using the distal internal carotid diameter as the denominator. RADIATION DOSE REDUCTION: This exam was performed according to the departmental dose-optimization program which includes automated exposure control, adjustment of the mA and/or kV according to patient size and/or use of iterative reconstruction technique. CONTRAST:  75mL OMNIPAQUE  IOHEXOL  350 MG/ML SOLN COMPARISON:  Brain MRI 03/11/2021. MRA head 03/11/2021. MRA neck 03/11/2021. FINDINGS: CT HEAD FINDINGS Brain: Generalized cerebral atrophy. Lacunar infarcts within the bilateral thalami, new from the prior head CT of 03/10/2021  but otherwise age-indeterminate. Patchy and ill-defined hypoattenuation within the cerebral white matter, nonspecific but compatible with mild chronic small vessel ischemic disease. There is no acute intracranial hemorrhage. No demarcated cortical infarct. No extra-axial fluid collection. No evidence of an intracranial mass. No midline shift. Vascular: No hyperdense vessel. Skull: No calvarial fracture or aggressive osseous lesion. Sinuses/Orbits: No mass or acute finding within the imaged orbits. Mild mucosal thickening within the bilateral ethmoid sinuses. Other: Chronic left nasal bone fracture deformity. Nonspecific 3.4 x 1.8 cm ovoid cutaneous/subcutaneous lesion within the suboccipital scalp just to the right of midline. (Series 2, image 10). ASPECTS (Alberta Stroke Program Early CT Score) - Ganglionic level infarction (caudate, lentiform nuclei, internal capsule, insula, M1-M3 cortex): 7 - Supraganglionic infarction (M4-M6  cortex): 3 Total score (0-10 with 10 being normal): 10 Review of the MIP images confirms the above findings These results were called by telephone at the time of interpretation on 12/10/2023 at 9:12 am to provider [facility] , who verbally acknowledged these results. CTA NECK FINDINGS Aortic arch: Standard aortic branching. The visible portions of the thoracic aorta are normal in caliber. No hemodynamically significant innominate or proximal subclavian artery stenosis. Right carotid system: CCA and ICA patent within the neck without stenosis. Minimal atherosclerotic plaque within the proximal ICA. Left carotid system: CCA and ICA patent within the neck without measurable stenosis. Mild atherosclerotic plaque within the proximal ICA. 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 In retrospect, this was present on the prior MRA neck of 03/11/2021 and is unchanged in size since that time. Vertebral arteries: Codominant and patent within the neck without stenosis or significant atherosclerotic disease. Skeleton: No acute fracture or aggressive osseous lesion. Cervical spondylosis. Multilevel ventral osteophytes at the C2-C3 through T1-T2 levels. This includes bulky bridging ventral osteophytes at C4-C5, C5-C6, C6-C7 and C7-T1. Other neck: No neck mass or cervical lymphadenopathy. Upper chest: No consolidation within the imaged lung apices. Partially visualized support tubes. Review of the MIP images confirms the above findings CTA HEAD FINDINGS Anterior circulation: The intracranial internal carotid arteries are patent. The M1 middle cerebral arteries are patent. No M2 proximal branch occlusion or high-grade proximal stenosis. The anterior cerebral arteries are patent. No intracranial aneurysm is identified. Posterior circulation: The intracranial vertebral arteries are patent. The basilar artery is  patent. The posterior cerebral arteries are patent. 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 Severe, near-occlusive stenosis within the left posterior cerebral artery at the P2/P3 junction, also new from the prior MRA (series 11, image 28). Posterior communicating arteries are diminutive or absent, bilaterally. Venous sinuses: Limited assessment for dural venous sinus thrombosis due to contrast timing. Anatomic variants: As described. Review of the MIP images confirms the above findings CTA head impressions #1 and #2 were called by telephone at the time of interpretation on 12/10/2023 at 9:32 am to provider [facility] , who verbally acknowledged these results. IMPRESSION: Non-contrast head CT: 1. Lacunar infarcts within the bilateral thalami which are new from the prior head CT of 03/10/2021, but otherwise age-indeterminate. Consider a brain MRI for further evaluation. 2. Background parenchymal atrophy and chronic small vessel ischemic disease. 3. Nonspecific 3.4 x 1.8 cm ovoid cutaneous/subcutaneous lesion within the suboccipital scalp just to the right of midline. Direct examination recommended. CTA neck: 1. The common carotid and internal carotid  arteries are patent within the neck without measurable stenosis. Mild atherosclerotic plaque bilaterally, as described. 2. 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 3. Vertebral arteries patent within the neck without stenosis or significant atherosclerotic disease. CT head: 1. 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 2. Severe stenosis within the right posterior cerebral artery P2 segment, new from the prior MRA. Electronically Signed   By: Rockey Childs D.O.   On: 12/10/2023 09:34   CT ANGIO HEAD NECK W WO  CM Result Date: 12/10/2023 CLINICAL DATA:  Code stroke. Neuro deficit, acute, stroke suspected. Altered mental status. On head. EXAM: CT ANGIOGRAPHY HEAD AND NECK TECHNIQUE: Multidetector CT imaging of the head and neck was performed using the standard protocol during bolus administration of intravenous contrast. Multiplanar CT image reconstructions and MIPs were obtained to evaluate the vascular anatomy. Carotid stenosis measurements (when applicable) are obtained utilizing NASCET criteria, using the distal internal carotid diameter as the denominator. RADIATION DOSE REDUCTION: This exam was performed according to the departmental dose-optimization program which includes automated exposure control, adjustment of the mA and/or kV according to patient size and/or use of iterative reconstruction technique. CONTRAST:  75mL OMNIPAQUE  IOHEXOL  350 MG/ML SOLN COMPARISON:  Brain MRI 03/11/2021. MRA head 03/11/2021. MRA neck 03/11/2021. FINDINGS: CT HEAD FINDINGS Brain: Generalized cerebral atrophy. Lacunar infarcts within the bilateral thalami, new from the prior head CT of 03/10/2021 but otherwise age-indeterminate. Patchy and ill-defined hypoattenuation within the cerebral white matter, nonspecific but compatible with mild chronic small vessel ischemic disease. There is no acute intracranial hemorrhage. No demarcated cortical infarct. No extra-axial fluid collection. No evidence of an intracranial mass. No midline shift. Vascular: No hyperdense vessel. Skull: No calvarial fracture or aggressive osseous lesion. Sinuses/Orbits: No mass or acute finding within the imaged orbits. Mild mucosal thickening within the bilateral ethmoid sinuses. Other: Chronic left nasal bone fracture deformity. Nonspecific 3.4 x 1.8 cm ovoid cutaneous/subcutaneous lesion within the suboccipital scalp just to the right of midline. (Series 2, image 10). ASPECTS (Alberta Stroke Program Early CT Score) - Ganglionic level infarction (caudate,  lentiform nuclei, internal capsule, insula, M1-M3 cortex): 7 - Supraganglionic infarction (M4-M6 cortex): 3 Total score (0-10 with 10 being normal): 10 Review of the MIP images confirms the above findings These results were called by telephone at the time of interpretation on 12/10/2023 at 9:12 am to provider [facility] , who verbally acknowledged these results. CTA NECK FINDINGS Aortic arch: Standard aortic branching. The visible portions of the thoracic aorta are normal in caliber. No hemodynamically significant innominate or proximal subclavian artery stenosis. Right carotid system: CCA and ICA patent within the neck without stenosis. Minimal atherosclerotic plaque within the proximal ICA. Left carotid system: CCA and ICA patent within the neck without measurable stenosis. Mild atherosclerotic plaque within the proximal ICA. 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 In retrospect, this was present on the prior MRA neck of 03/11/2021 and is unchanged in size since that time. Vertebral arteries: Codominant and patent within the neck without stenosis or significant atherosclerotic disease. Skeleton: No acute fracture or aggressive osseous lesion. Cervical spondylosis. Multilevel ventral osteophytes at the C2-C3 through T1-T2 levels. This includes bulky bridging ventral osteophytes at C4-C5, C5-C6, C6-C7 and C7-T1. Other neck: No  neck mass or cervical lymphadenopathy. Upper chest: No consolidation within the imaged lung apices. Partially visualized support tubes. Review of the MIP images confirms the above findings CTA HEAD FINDINGS Anterior circulation: The intracranial internal carotid arteries are patent. The M1 middle cerebral arteries are patent. No M2 proximal branch occlusion or high-grade proximal stenosis. The anterior cerebral arteries are patent. No intracranial aneurysm is identified.  Posterior circulation: The intracranial vertebral arteries are patent. The basilar artery is patent. The posterior cerebral arteries are patent. 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 Severe, near-occlusive stenosis within the left posterior cerebral artery at the P2/P3 junction, also new from the prior MRA (series 11, image 28). Posterior communicating arteries are diminutive or absent, bilaterally. Venous sinuses: Limited assessment for dural venous sinus thrombosis due to contrast timing. Anatomic variants: As described. Review of the MIP images confirms the above findings CTA head impressions #1 and #2 were called by telephone at the time of interpretation on 12/10/2023 at 9:32 am to provider [facility] , who verbally acknowledged these results. IMPRESSION: Non-contrast head CT: 1. Lacunar infarcts within the bilateral thalami which are new from the prior head CT of 03/10/2021, but otherwise age-indeterminate. Consider a brain MRI for further evaluation. 2. Background parenchymal atrophy and chronic small vessel ischemic disease. 3. Nonspecific 3.4 x 1.8 cm ovoid cutaneous/subcutaneous lesion within the suboccipital scalp just to the right of midline. Direct examination recommended. CTA neck: 1. The common carotid and internal carotid arteries are patent within the neck without measurable stenosis. Mild atherosclerotic plaque bilaterally, as described. 2. 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 3. Vertebral arteries patent within the neck without stenosis or significant atherosclerotic disease. CT head: 1. 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 2. Severe stenosis within the right posterior cerebral artery P2 segment, new from the prior MRA.  Electronically Signed   By: Rockey Childs D.O.   On: 12/10/2023 09:34   DG Chest Portable 1 View Result Date: 12/10/2023 CLINICAL DATA:  Endotracheal tube placement. EXAM: PORTABLE CHEST 1 VIEW COMPARISON:  None recent.  Radiographs 01/24/2013. FINDINGS: 0826 hours. Tip of the endotracheal tube is approximately 3.2 cm above the carina. Enteric tube projects below the diaphragm, tip not visualized. The heart size and mediastinal contours are stable. The lungs appear clear. The right costophrenic angle is incompletely visualized. No evidence of pneumothorax or large pleural effusion. Mild degenerative changes of the spine without evidence of acute osseous abnormality. IMPRESSION: Satisfactory position of endotracheal and enteric tubes. No evidence of acute cardiopulmonary process. Electronically Signed   By: Elsie Perone M.D.   On: 12/10/2023 08:47    Microbiology: Results for orders placed or performed during the hospital encounter of 12/10/23  MRSA Next Gen by PCR, Nasal     Status: None   Collection Time: 12/10/23  2:23 PM   Specimen: Nasal Mucosa; Nasal Swab  Result Value Ref Range Status   MRSA by PCR Next Gen NOT DETECTED NOT DETECTED Final    Comment: (NOTE) The GeneXpert MRSA Assay (FDA approved for NASAL specimens only), is one component of a comprehensive MRSA colonization surveillance program. It is not intended to diagnose MRSA infection nor to guide or monitor treatment for MRSA infections. Test performance is not FDA approved  in patients less than 62 years old. Performed at [facility], [address], [place], [place] [address]   Culture, Respiratory w Gram Stain     Status: None   Collection Time: 12/10/23  2:46 PM   Specimen: Tracheal Aspirate; Respiratory  Result Value Ref Range Status   Specimen Description TRACHEAL ASPIRATE  Final   Special Requests NONE  Final   Gram Stain NO WBC SEEN FEW GRAM POSITIVE COCCI IN PAIRS   Final   Culture   Final    MODERATE  Normal respiratory flora-no Staph aureus or Pseudomonas seen Performed at [facility], [address], [place], [place] [address]    Report Status 12/12/2023 FINAL  Final  Aerobic/Anaerobic Culture w Gram Stain (surgical/deep wound)     Status: None   Collection Time: 12/10/23  6:23 PM   Specimen: Abscess  Result Value Ref Range Status   Specimen Description ABSCESS  Final   Special Requests NONE  Final   Gram Stain   Final    ABUNDANT WBC PRESENT, PREDOMINANTLY PMN MODERATE GRAM POSITIVE COCCI IN PAIRS AND CHAINS    Culture   Final    ABUNDANT STAPHYLOCOCCUS AUREUS MODERATE STAPHYLOCOCCUS LUGDUNENSIS MODERATE GROUP A STREP (S.PYOGENES) ISOLATED Beta hemolytic streptococci are predictably susceptible to penicillin and other beta lactams. Susceptibility testing not routinely performed. NO ANAEROBES ISOLATED Performed at [facility], [address] [place], [place] [address]    Report Status 12/15/2023 FINAL  Final   Organism ID, Bacteria STAPHYLOCOCCUS AUREUS  Final   Organism ID, Bacteria STAPHYLOCOCCUS LUGDUNENSIS  Final      Susceptibility   Staphylococcus aureus - MIC*    CIPROFLOXACIN >=8 RESISTANT Resistant     ERYTHROMYCIN >=8 RESISTANT Resistant     GENTAMICIN <=0.5 SENSITIVE Sensitive     OXACILLIN 0.5 SENSITIVE Sensitive     TETRACYCLINE >=16 RESISTANT Resistant     VANCOMYCIN  1 SENSITIVE Sensitive     TRIMETH/SULFA >=320 RESISTANT Resistant     CLINDAMYCIN <=0.25 SENSITIVE Sensitive     RIFAMPIN <=0.5 SENSITIVE Sensitive     Inducible Clindamycin NEGATIVE Sensitive     LINEZOLID  2 SENSITIVE Sensitive     * ABUNDANT STAPHYLOCOCCUS AUREUS   Staphylococcus lugdunensis - MIC*    CIPROFLOXACIN <=0.5 SENSITIVE Sensitive     ERYTHROMYCIN <=0.25 SENSITIVE Sensitive     GENTAMICIN <=0.5 SENSITIVE Sensitive     OXACILLIN 2 SENSITIVE Sensitive     TETRACYCLINE <=1 SENSITIVE Sensitive     VANCOMYCIN  <=0.5 SENSITIVE Sensitive     TRIMETH/SULFA <=10 SENSITIVE  Sensitive     CLINDAMYCIN <=0.25 SENSITIVE Sensitive     RIFAMPIN <=0.5 SENSITIVE Sensitive     Inducible Clindamycin NEGATIVE Sensitive     * MODERATE STAPHYLOCOCCUS LUGDUNENSIS    Labs: CBC: Recent Labs  Lab 12/11/23 0423 12/12/23 0518 12/14/23 0746 12/15/23 0910 12/16/23 0542  WBC 12.6* 7.5 6.5 5.5 6.0  HGB 17.1* 15.3 15.7 17.3* 16.4  HCT 50.1 45.9 45.2 49.9 47.7  MCV 94.2 94.6 92.8 92.6 93.0  PLT 161 123* 144* 159 142*   Basic Metabolic Panel: Recent Labs  Lab 12/11/23 0423 12/12/23 0518 12/14/23 0746 12/15/23 0910 12/16/23 0542 12/17/23 0848  NA 137 137 139 137 140 140  K 3.8 3.8 3.3* 3.5 3.4* 3.6  CL 100 100 105 107 108 110  CO2 24 24 23  20* 23 20*  GLUCOSE 156* 101* 98 97 103* 186*  BUN 9 10 8 11 12  14  CREATININE 1.52* 1.18 1.25* 1.30* 1.23 1.33*  CALCIUM  8.8* 8.5* 9.2 9.5 9.4 9.4  MG 2.1 2.1  --   --   --   --   PHOS 3.2 2.9 2.7 3.0 2.5  --    Liver Function Tests: Recent Labs  Lab 12/14/23 0746 12/15/23 0910 12/16/23 0542  ALBUMIN 2.7* 2.9* 2.9*   CBG: Recent Labs  Lab 12/16/23 2010 12/16/23 2335 12/17/23 0417 12/17/23 0818 12/17/23 1242  GLUCAP 131* 109* 142* 219* 71    Discharge time spent: greater than 30 minutes.  Signed: Owen DELENA Lore, MD [facility] 12/17/2023

## 2023-12-17 NOTE — Progress Notes (Signed)
 Report called to [place], spoke with Vernell. All questions and concerns about patient we're addressed and answered.

## 2023-12-17 NOTE — Progress Notes (Signed)
 Occupational Therapy Treatment Patient Details Name: Philip [name] MRN: [id number] DOB: [birth year] Today's Date: 12/17/2023   History of present illness 68 y/o gentleman who awoke 12/10/23 with L-sided weakness and dysarthria. +seizure with EMS; intubated; HTN emergency 227/138; +scalp abscess with I&D bedside 7/18; CT Head bil thalami infarcts; CTA head near occlusive stenosis of L and R PCA; extubated 7/18 PMH- CVA in 2014, GERD, seizures, OSA, cocaine & tobacco abuse, HTN, hepatitis C   OT comments  Good progress toward patient focused goals.  More alert and following commands well.  Nice increase to ADL independence, needing Min A for ADL and mobility at RW level.  OT will continue efforts, but with a short post acute rehab stay, patient should return to Mod I level for return home alone.  Patient will benefit from continued inpatient follow up therapy, <3 hours/day.      If plan is discharge home, recommend the following:  Assist for transportation;A lot of help with bathing/dressing/bathroom;Two people to help with walking and/or transfers   Equipment Recommendations  None recommended by OT    Recommendations for Other Services      Precautions / Restrictions Precautions Precautions: Fall Restrictions Weight Bearing Restrictions Per Provider Order: No       Mobility Bed Mobility Overal bed mobility: Needs Assistance Bed Mobility: Sit to Supine       Sit to supine: Supervision, HOB elevated        Transfers Overall transfer level: Needs assistance Equipment used: Rolling walker (2 wheels) Transfers: Bed to chair/wheelchair/BSC Sit to Stand: Contact guard assist     Step pivot transfers: Contact guard assist           Balance Overall balance assessment: Needs assistance Sitting-balance support: Feet supported, No upper extremity supported Sitting balance-Leahy Scale: Good     Standing balance support: Reliant on assistive device for balance Standing  balance-Leahy Scale: Fair                             ADL either performed or assessed with clinical judgement   ADL       Grooming: Set up;Sitting   Upper Body Bathing: Set up;Sitting   Lower Body Bathing: Minimal assistance;Sit to/from stand   Upper Body Dressing : Set up;Sitting   Lower Body Dressing: Minimal assistance   Toilet Transfer: Minimal assistance;Rolling walker (2 wheels);Regular Toilet;Ambulation   Toileting- Clothing Manipulation and Hygiene: Supervision/safety;Sitting/lateral lean              Extremity/Trunk Assessment Upper Extremity Assessment Upper Extremity Assessment: Overall WFL for tasks assessed       Cervical / Trunk Assessment Cervical / Trunk Assessment: Normal    Vision Patient Visual Report: No change from baseline     Perception Perception Perception: Not tested   Praxis Praxis Praxis: Not tested   Communication Communication Communication: No apparent difficulties   Cognition Arousal: Alert Behavior During Therapy: WFL for tasks assessed/performed Cognition: Cognition impaired       Memory impairment (select all impairments): Short-term memory Attention impairment (select first level of impairment): Selective attention                     Following commands: Impaired Following commands impaired: Follows multi-step commands with increased time      Cueing   Cueing Techniques: Verbal cues  Exercises      Shoulder Instructions       General Comments  Pertinent Vitals/ Pain       Pain Assessment Pain Assessment: No/denies pain  Home Living                                          Prior Functioning/Environment              Frequency  Min 2X/week        Progress Toward Goals  OT Goals(current goals can now be found in the care plan section)  Progress towards OT goals: Progressing toward goals  Acute Rehab OT Goals OT Goal Formulation: With  patient Time For Goal Achievement: 12/28/23 Potential to Achieve Goals: Good  Plan      Co-evaluation                 AM-PAC OT 6 Clicks Daily Activity     Outcome Measure   Help from another person eating meals?: None Help from another person taking care of personal grooming?: None Help from another person toileting, which includes using toliet, bedpan, or urinal?: A Little Help from another person bathing (including washing, rinsing, drying)?: A Little Help from another person to put on and taking off regular upper body clothing?: None Help from another person to put on and taking off regular lower body clothing?: A Little 6 Click Score: 21    End of Session    OT Visit Diagnosis: Unsteadiness on feet (R26.81)   Activity Tolerance Patient tolerated treatment well   Patient Left in chair;with call bell/phone within reach   Nurse Communication Mobility status        Time: 9168-9149 OT Time Calculation (min): 19 min  Charges: OT General Charges $OT Visit: 1 Visit OT Treatments $Self Care/Home Management : 8-22 mins  12/17/2023  RP, OTR/L  Acute Rehabilitation Services  Office:  [phone]   Philip [name] 12/17/2023, 9:16 AM

## 2023-12-17 NOTE — TOC Transition Note (Addendum)
 Transition of Care [facility]) - Discharge Note   Patient Details  Name: Philip [name] MRN: [id number] Date of Birth: February 29, 1956  [facility] CM/SW Contact:  Jeoffrey LITTIE Moose, LCSW Phone Number: 12/17/2023, 3:12 PM   Clinical Narrative:    Patient will DC to: [facility] Anticipated DC date: 12/17/23 Family notified: Yes Transport by: [facility]   Per MD patient ready for DC to [facility]. RN to call report prior to discharge [phone] Room 218. RN, patient, patient's family, and facility notified of DC. Discharge Summary and FL2 sent to facility. DC packet on chart. Ambulance transport requested for patient.   CSW will sign off for now as social work intervention is no longer needed. Please consult us  again if new needs arise.     Final next level of care: Skilled Nursing Facility Barriers to Discharge: Barriers Resolved   Patient Goals and CMS Choice Patient states their goals for this hospitalization and ongoing recovery are:: Patients wishes unknown   Choice offered to / list presented to : Sibling      Discharge Placement   Existing PASRR number confirmed : 12/17/23          [facility] Patient to be transferred to facility by: [facility] Name of family member notified: Philip [name] Patient and family notified of of transfer: 12/17/23  Discharge Plan and Services Additional resources added to the After Visit Summary for                                       Social Drivers of Health (SDOH) Interventions SDOH Screenings   Food Insecurity: No Food Insecurity (12/07/2023)  Housing: Unknown (12/07/2023)  Transportation Needs: No Transportation Needs (12/07/2023)  Utilities: Not At Risk (12/07/2023)  Alcohol Screen: Low Risk  (12/07/2023)  Depression (PHQ2-9): Low Risk  (12/07/2023)  Financial Resource Strain: Low Risk  (12/07/2023)  Physical Activity: Insufficiently Active (12/07/2023)  Social Connections: Socially Isolated  (12/07/2023)  Stress: No Stress Concern Present (12/07/2023)  Tobacco Use: High Risk (12/11/2023)  Health Literacy: Adequate Health Literacy (12/07/2023)     Readmission Risk Interventions     No data to display

## 2023-12-18 DIAGNOSIS — N179 Acute kidney failure, unspecified: Secondary | ICD-10-CM | POA: Diagnosis not present

## 2023-12-18 DIAGNOSIS — G9341 Metabolic encephalopathy: Secondary | ICD-10-CM | POA: Diagnosis not present

## 2023-12-18 DIAGNOSIS — J9601 Acute respiratory failure with hypoxia: Secondary | ICD-10-CM | POA: Diagnosis not present

## 2023-12-18 DIAGNOSIS — I10 Essential (primary) hypertension: Secondary | ICD-10-CM | POA: Diagnosis not present

## 2023-12-18 DIAGNOSIS — L02811 Cutaneous abscess of head [any part, except face]: Secondary | ICD-10-CM | POA: Diagnosis not present

## 2023-12-18 DIAGNOSIS — K219 Gastro-esophageal reflux disease without esophagitis: Secondary | ICD-10-CM | POA: Diagnosis not present

## 2023-12-18 DIAGNOSIS — G4733 Obstructive sleep apnea (adult) (pediatric): Secondary | ICD-10-CM | POA: Diagnosis not present

## 2023-12-18 DIAGNOSIS — R9082 White matter disease, unspecified: Secondary | ICD-10-CM | POA: Diagnosis not present

## 2023-12-18 DIAGNOSIS — B9561 Methicillin susceptible Staphylococcus aureus infection as the cause of diseases classified elsewhere: Secondary | ICD-10-CM | POA: Diagnosis not present

## 2023-12-21 DIAGNOSIS — B9561 Methicillin susceptible Staphylococcus aureus infection as the cause of diseases classified elsewhere: Secondary | ICD-10-CM | POA: Diagnosis not present

## 2023-12-21 DIAGNOSIS — R9082 White matter disease, unspecified: Secondary | ICD-10-CM | POA: Diagnosis not present

## 2023-12-21 DIAGNOSIS — G4733 Obstructive sleep apnea (adult) (pediatric): Secondary | ICD-10-CM | POA: Diagnosis not present

## 2023-12-21 DIAGNOSIS — K219 Gastro-esophageal reflux disease without esophagitis: Secondary | ICD-10-CM | POA: Diagnosis not present

## 2023-12-21 DIAGNOSIS — J9601 Acute respiratory failure with hypoxia: Secondary | ICD-10-CM | POA: Diagnosis not present

## 2023-12-21 DIAGNOSIS — G9341 Metabolic encephalopathy: Secondary | ICD-10-CM | POA: Diagnosis not present

## 2023-12-21 DIAGNOSIS — N179 Acute kidney failure, unspecified: Secondary | ICD-10-CM | POA: Diagnosis not present

## 2023-12-21 DIAGNOSIS — I10 Essential (primary) hypertension: Secondary | ICD-10-CM | POA: Diagnosis not present

## 2023-12-21 DIAGNOSIS — L02811 Cutaneous abscess of head [any part, except face]: Secondary | ICD-10-CM | POA: Diagnosis not present

## 2023-12-22 DIAGNOSIS — G4733 Obstructive sleep apnea (adult) (pediatric): Secondary | ICD-10-CM | POA: Diagnosis not present

## 2023-12-22 DIAGNOSIS — R9082 White matter disease, unspecified: Secondary | ICD-10-CM | POA: Diagnosis not present

## 2023-12-22 DIAGNOSIS — I10 Essential (primary) hypertension: Secondary | ICD-10-CM | POA: Diagnosis not present

## 2023-12-22 DIAGNOSIS — N179 Acute kidney failure, unspecified: Secondary | ICD-10-CM | POA: Diagnosis not present

## 2023-12-22 DIAGNOSIS — K219 Gastro-esophageal reflux disease without esophagitis: Secondary | ICD-10-CM | POA: Diagnosis not present

## 2023-12-22 DIAGNOSIS — G9341 Metabolic encephalopathy: Secondary | ICD-10-CM | POA: Diagnosis not present

## 2023-12-22 DIAGNOSIS — J9601 Acute respiratory failure with hypoxia: Secondary | ICD-10-CM | POA: Diagnosis not present

## 2023-12-22 DIAGNOSIS — B9561 Methicillin susceptible Staphylococcus aureus infection as the cause of diseases classified elsewhere: Secondary | ICD-10-CM | POA: Diagnosis not present

## 2023-12-22 DIAGNOSIS — L02811 Cutaneous abscess of head [any part, except face]: Secondary | ICD-10-CM | POA: Diagnosis not present

## 2023-12-23 DIAGNOSIS — B9561 Methicillin susceptible Staphylococcus aureus infection as the cause of diseases classified elsewhere: Secondary | ICD-10-CM | POA: Diagnosis not present

## 2023-12-23 DIAGNOSIS — G9341 Metabolic encephalopathy: Secondary | ICD-10-CM | POA: Diagnosis not present

## 2023-12-23 DIAGNOSIS — K219 Gastro-esophageal reflux disease without esophagitis: Secondary | ICD-10-CM | POA: Diagnosis not present

## 2023-12-23 DIAGNOSIS — L02811 Cutaneous abscess of head [any part, except face]: Secondary | ICD-10-CM | POA: Diagnosis not present

## 2023-12-23 DIAGNOSIS — R9082 White matter disease, unspecified: Secondary | ICD-10-CM | POA: Diagnosis not present

## 2023-12-23 DIAGNOSIS — G4733 Obstructive sleep apnea (adult) (pediatric): Secondary | ICD-10-CM | POA: Diagnosis not present

## 2023-12-23 DIAGNOSIS — J9601 Acute respiratory failure with hypoxia: Secondary | ICD-10-CM | POA: Diagnosis not present

## 2023-12-23 DIAGNOSIS — I10 Essential (primary) hypertension: Secondary | ICD-10-CM | POA: Diagnosis not present

## 2023-12-23 DIAGNOSIS — N179 Acute kidney failure, unspecified: Secondary | ICD-10-CM | POA: Diagnosis not present

## 2023-12-24 DIAGNOSIS — K219 Gastro-esophageal reflux disease without esophagitis: Secondary | ICD-10-CM | POA: Diagnosis not present

## 2023-12-24 DIAGNOSIS — B9561 Methicillin susceptible Staphylococcus aureus infection as the cause of diseases classified elsewhere: Secondary | ICD-10-CM | POA: Diagnosis not present

## 2023-12-24 DIAGNOSIS — N179 Acute kidney failure, unspecified: Secondary | ICD-10-CM | POA: Diagnosis not present

## 2023-12-24 DIAGNOSIS — G9341 Metabolic encephalopathy: Secondary | ICD-10-CM | POA: Diagnosis not present

## 2023-12-24 DIAGNOSIS — G4733 Obstructive sleep apnea (adult) (pediatric): Secondary | ICD-10-CM | POA: Diagnosis not present

## 2023-12-24 DIAGNOSIS — R9082 White matter disease, unspecified: Secondary | ICD-10-CM | POA: Diagnosis not present

## 2023-12-24 DIAGNOSIS — L02811 Cutaneous abscess of head [any part, except face]: Secondary | ICD-10-CM | POA: Diagnosis not present

## 2023-12-24 DIAGNOSIS — J9601 Acute respiratory failure with hypoxia: Secondary | ICD-10-CM | POA: Diagnosis not present

## 2023-12-24 DIAGNOSIS — I10 Essential (primary) hypertension: Secondary | ICD-10-CM | POA: Diagnosis not present

## 2023-12-25 DIAGNOSIS — G9341 Metabolic encephalopathy: Secondary | ICD-10-CM | POA: Diagnosis not present

## 2023-12-25 DIAGNOSIS — J9601 Acute respiratory failure with hypoxia: Secondary | ICD-10-CM | POA: Diagnosis not present

## 2023-12-25 DIAGNOSIS — L02811 Cutaneous abscess of head [any part, except face]: Secondary | ICD-10-CM | POA: Diagnosis not present

## 2023-12-25 DIAGNOSIS — N179 Acute kidney failure, unspecified: Secondary | ICD-10-CM | POA: Diagnosis not present

## 2023-12-25 DIAGNOSIS — R9082 White matter disease, unspecified: Secondary | ICD-10-CM | POA: Diagnosis not present

## 2023-12-25 DIAGNOSIS — I10 Essential (primary) hypertension: Secondary | ICD-10-CM | POA: Diagnosis not present

## 2023-12-25 DIAGNOSIS — G4733 Obstructive sleep apnea (adult) (pediatric): Secondary | ICD-10-CM | POA: Diagnosis not present

## 2023-12-25 DIAGNOSIS — B9561 Methicillin susceptible Staphylococcus aureus infection as the cause of diseases classified elsewhere: Secondary | ICD-10-CM | POA: Diagnosis not present

## 2023-12-25 DIAGNOSIS — K219 Gastro-esophageal reflux disease without esophagitis: Secondary | ICD-10-CM | POA: Diagnosis not present

## 2023-12-31 ENCOUNTER — Encounter: Payer: Self-pay | Admitting: Family Medicine

## 2023-12-31 ENCOUNTER — Ambulatory Visit: Admitting: Family Medicine

## 2023-12-31 VITALS — BP 139/87 | HR 76 | Temp 97.1°F | Ht 67.0 in | Wt 163.0 lb

## 2023-12-31 DIAGNOSIS — Z23 Encounter for immunization: Secondary | ICD-10-CM | POA: Diagnosis not present

## 2023-12-31 DIAGNOSIS — F17210 Nicotine dependence, cigarettes, uncomplicated: Secondary | ICD-10-CM

## 2023-12-31 DIAGNOSIS — I10 Essential (primary) hypertension: Secondary | ICD-10-CM

## 2023-12-31 DIAGNOSIS — G4733 Obstructive sleep apnea (adult) (pediatric): Secondary | ICD-10-CM | POA: Diagnosis not present

## 2023-12-31 DIAGNOSIS — G40909 Epilepsy, unspecified, not intractable, without status epilepticus: Secondary | ICD-10-CM

## 2023-12-31 DIAGNOSIS — Z720 Tobacco use: Secondary | ICD-10-CM

## 2023-12-31 DIAGNOSIS — E785 Hyperlipidemia, unspecified: Secondary | ICD-10-CM | POA: Diagnosis not present

## 2023-12-31 DIAGNOSIS — R519 Headache, unspecified: Secondary | ICD-10-CM

## 2023-12-31 DIAGNOSIS — E559 Vitamin D deficiency, unspecified: Secondary | ICD-10-CM | POA: Diagnosis not present

## 2023-12-31 DIAGNOSIS — K7030 Alcoholic cirrhosis of liver without ascites: Secondary | ICD-10-CM

## 2023-12-31 DIAGNOSIS — R350 Frequency of micturition: Secondary | ICD-10-CM

## 2023-12-31 DIAGNOSIS — N401 Enlarged prostate with lower urinary tract symptoms: Secondary | ICD-10-CM

## 2023-12-31 DIAGNOSIS — G8929 Other chronic pain: Secondary | ICD-10-CM | POA: Diagnosis not present

## 2023-12-31 DIAGNOSIS — Z0000 Encounter for general adult medical examination without abnormal findings: Secondary | ICD-10-CM

## 2023-12-31 DIAGNOSIS — Z125 Encounter for screening for malignant neoplasm of prostate: Secondary | ICD-10-CM

## 2023-12-31 DIAGNOSIS — F1011 Alcohol abuse, in remission: Secondary | ICD-10-CM

## 2023-12-31 MED ORDER — HYDRALAZINE HCL 25 MG PO TABS: 75.0000 mg | ORAL_TABLET | Freq: Three times a day (TID) | ORAL | 3 refills | Status: DC

## 2023-12-31 MED ORDER — AMLODIPINE BESYLATE 10 MG PO TABS
10.0000 mg | ORAL_TABLET | Freq: Every day | ORAL | 3 refills | Status: AC
Start: 2023-12-31 — End: 2024-04-18

## 2023-12-31 MED ORDER — FOLIC ACID 1 MG PO TABS: 1.0000 mg | ORAL_TABLET | Freq: Every day | ORAL | 1 refills | Status: DC

## 2023-12-31 MED ORDER — LEVETIRACETAM 500 MG PO TABS: 500.0000 mg | ORAL_TABLET | Freq: Two times a day (BID) | ORAL | 3 refills | Status: DC

## 2023-12-31 MED ORDER — AMLODIPINE BESYLATE 10 MG PO TABS: 10.0000 mg | ORAL_TABLET | Freq: Every day | ORAL | 0 refills | Status: DC

## 2023-12-31 MED ORDER — VITAMIN B-1 100 MG PO TABS: 100.0000 mg | ORAL_TABLET | Freq: Every day | ORAL | 0 refills | Status: DC

## 2023-12-31 MED ORDER — HYDRALAZINE HCL 25 MG PO TABS: 75.0000 mg | ORAL_TABLET | Freq: Three times a day (TID) | ORAL | 2 refills | Status: DC

## 2023-12-31 MED ORDER — LOSARTAN POTASSIUM 25 MG PO TABS: 25.0000 mg | ORAL_TABLET | Freq: Every day | ORAL | 3 refills | Status: DC

## 2023-12-31 MED ORDER — VITAMIN B-1 100 MG PO TABS: 100.0000 mg | ORAL_TABLET | Freq: Every day | ORAL | 3 refills | Status: DC

## 2023-12-31 MED ORDER — ASPIRIN 81 MG PO CHEW: 81.0000 mg | CHEWABLE_TABLET | Freq: Every day | ORAL | 0 refills | Status: DC

## 2023-12-31 MED ORDER — GABAPENTIN 300 MG PO CAPS: 600.0000 mg | ORAL_CAPSULE | Freq: Every day | ORAL | 3 refills | Status: DC

## 2023-12-31 MED ORDER — FOLIC ACID 1 MG PO TABS: 1.0000 mg | ORAL_TABLET | Freq: Every day | ORAL | 3 refills | Status: DC

## 2023-12-31 MED ORDER — DOCUSATE SODIUM 50 MG/5ML PO LIQD: 100.0000 mg | Freq: Two times a day (BID) | ORAL | 0 refills | Status: DC

## 2023-12-31 MED ORDER — ATORVASTATIN CALCIUM 40 MG PO TABS: 40.0000 mg | ORAL_TABLET | Freq: Every day | ORAL | 3 refills | Status: DC

## 2023-12-31 MED ORDER — LOSARTAN POTASSIUM 25 MG PO TABS: 25.0000 mg | ORAL_TABLET | Freq: Every day | ORAL | 0 refills | Status: DC

## 2023-12-31 MED ORDER — PANTOPRAZOLE SODIUM 40 MG PO TBEC: 40.0000 mg | DELAYED_RELEASE_TABLET | Freq: Every day | ORAL | 3 refills | Status: DC

## 2023-12-31 MED ORDER — DOCUSATE SODIUM 50 MG/5ML PO LIQD: 100.0000 mg | Freq: Two times a day (BID) | ORAL | 10 refills | Status: DC

## 2023-12-31 MED ORDER — LEVETIRACETAM 500 MG PO TABS: 500.0000 mg | ORAL_TABLET | Freq: Two times a day (BID) | ORAL | 2 refills | Status: DC

## 2023-12-31 MED ORDER — TOPIRAMATE 50 MG PO TABS: 50.0000 mg | ORAL_TABLET | Freq: Two times a day (BID) | ORAL | 0 refills | Status: DC

## 2023-12-31 MED ORDER — ASPIRIN 81 MG PO CHEW: 81.0000 mg | CHEWABLE_TABLET | Freq: Every day | ORAL | 3 refills | Status: DC

## 2023-12-31 NOTE — Progress Notes (Addendum)
 Subjective:  Patient ID: Philip [name], male    DOB: May 18, 1956  Age: 68 y.o. MRN: [id number]  CC: Annual Exam   HPI [name] presents for physical exam he was recently hospitalized  from July 17 through July 24 or a seizure.  He has a history of seizure disorder but had not had a seizure in a long time so he had dropped off of his seizure medication.  At time of discharge it was recommended that he follow-up with neurology.  However, he states he does not have a neurologist.  There is no known trigger for this seizure.  His sister is here with him and claims that he drinks a lot and does not take his medicine.  Patient states that he has not had a drink in a long time and that he is in remission.  My concern is that the seizure may have come when he quit drinking and developed DTs.  However that is not documented.  Interestingly, what is documented is that he had a hypertensive crisis with blood pressures up to 250/130 at the time of admission.  This also may have contributed to his acute seizure Patient started smoking at age 49 currently 1/2 pack a day but was up to 2 packs a day at 1 time it is difficult to ascertain exactly how many packs a day on average over the last 45 years that he might have been smoking.  It is apparent that it is at least a pack a day on average so his history is in excess of 45 pack years.  He was noted to have a hypertensive crisis in the hospital and was placed on hydralazine  in addition to his amlodipine .  He states that he is currently taking his medicines as ordered.  In fact complains that it is kind of hard to get at the inside the bubbles that they are in currently from his hospital discharge.  Of note is that he had left-sided weakness when he was first admitted to the hospital but that has resolved.     12/07/2023    2:00 PM 09/25/2023    1:06 PM 12/29/2022   10:33 AM  Depression screen PHQ 2/9  Decreased Interest 0 0 0  Down, Depressed, Hopeless 1 0 0  PHQ  - 2 Score 1 0 0  Altered sleeping 0 0   Tired, decreased energy 0 0   Change in appetite 0 0   Feeling bad or failure about yourself  0 0   Trouble concentrating 0 0   Moving slowly or fidgety/restless 0 0   Suicidal thoughts 0 0   PHQ-9 Score 1 0   Difficult doing work/chores Not difficult at all Not difficult at all     History Renner has a past medical history of Chronic cholecystitis with calculus (02/04/2013), CVA (cerebral vascular accident) (09/16/2012), Diabetes mellitus without complication (HCC), Erectile dysfunction, Gallstones (01/27/2013), GERD (gastroesophageal reflux disease), H. pylori infection (09/28/2017), Headache(784.0), Hepatitis C, History of kidney stones, History of migraine, Hypertension, Hypertension, Nausea, Seizures (HCC), Short-term memory loss, Sleep apnea, Stroke (HCC), Tobacco user, and Umbilical hernia (02/04/2013).   He has a past surgical history that includes Umbilical hernia repair (N/A, 03/09/2013); Colonoscopy (06/2014); Esophagogastroduodenoscopy (egd) with propofol  (N/A, 08/21/2015); Cholecystectomy (N/A, 03/09/2013); Esophagogastroduodenoscopy (egd) with propofol  (N/A, 03/10/2017); and biopsy (03/10/2017).   His family history includes Cancer in his cousin; Colon cancer in his maternal uncle; Heart disease in his maternal grandfather, maternal grandmother, paternal grandfather,  and paternal grandmother; Hypertension in his mother.He reports that he has been smoking cigarettes. He started smoking about 45 years ago. He has a 9.1 pack-year smoking history. He has never used smokeless tobacco. He reports current alcohol use of about 7.0 standard drinks of alcohol per week. He reports that he does not use drugs.    ROS Review of Systems  Constitutional: Negative.  Negative for activity change, fatigue and unexpected weight change.  HENT:  Positive for dental problem (Poor dentition has not seen a dentist in over a decade.). Negative for congestion, ear pain,  hearing loss, postnasal drip and trouble swallowing.   Eyes:  Negative for pain and visual disturbance.  Respiratory:  Negative for cough, chest tightness and shortness of breath.   Cardiovascular:  Negative for chest pain, palpitations and leg swelling.  Gastrointestinal:  Negative for abdominal distention, abdominal pain, blood in stool, constipation, diarrhea, nausea and vomiting.  Endocrine: Negative for cold intolerance, heat intolerance and polydipsia.  Genitourinary:  Negative for difficulty urinating, dysuria, flank pain, frequency and urgency.  Musculoskeletal:  Positive for arthralgias. Negative for joint swelling and myalgias.  Skin:  Negative for color change, rash and wound.  Neurological:  Negative for dizziness, syncope, speech difficulty, weakness, light-headedness, numbness and headaches.  Hematological:  Does not bruise/bleed easily.  Psychiatric/Behavioral:  Negative for confusion, decreased concentration, dysphoric mood and sleep disturbance. The patient is not nervous/anxious.     Objective:  BP 139/87   Pulse 76   Temp (!) 97.1 F (36.2 C)   Ht 5' 7 (1.702 m)   Wt 163 lb (73.9 kg)   SpO2 100%   BMI 25.53 kg/m   BP Readings from Last 3 Encounters:  12/31/23 139/87  12/17/23 135/86  12/07/23 (!) 206/105    Wt Readings from Last 3 Encounters:  12/31/23 163 lb (73.9 kg)  12/17/23 160 lb 4.4 oz (72.7 kg)  12/07/23 169 lb (76.7 kg)     Physical Exam Constitutional:      General: He is not in acute distress.    Appearance: He is well-developed. He is ill-appearing. He is not toxic-appearing.     Comments: Patient is disheveled and poorly groomed.  HENT:     Head: Normocephalic and atraumatic.  Eyes:     Pupils: Pupils are equal, round, and reactive to light.  Neck:     Thyroid : No thyromegaly.     Trachea: No tracheal deviation.  Cardiovascular:     Rate and Rhythm: Normal rate and regular rhythm.     Heart sounds: Normal heart sounds. No murmur  heard.    No friction rub. No gallop.  Pulmonary:     Breath sounds: Normal breath sounds. No wheezing or rales.  Abdominal:     General: Bowel sounds are normal. There is no distension.     Palpations: Abdomen is soft. There is no mass.     Tenderness: There is no abdominal tenderness.     Hernia: There is no hernia in the left inguinal area.  Genitourinary:    Penis: Normal.      Testes: Normal.  Musculoskeletal:        General: Normal range of motion.     Cervical back: Normal range of motion.  Lymphadenopathy:     Cervical: No cervical adenopathy.  Skin:    General: Skin is warm and dry.  Neurological:     Mental Status: He is alert and oriented to person, place, and time.  Psychiatric:  Mood and Affect: Mood normal.      Assessment & Plan:  Alcoholic cirrhosis of liver without ascites (HCC) -     CBC with Differential/Platelet -     CMP14+EGFR -     Folic Acid ; Take 1 tablet (1 mg total) by mouth daily.  Dispense: 90 tablet; Refill: 3  Primary hypertension -     CBC with Differential/Platelet -     CMP14+EGFR -     Urinalysis -     amLODIPine  Besylate; Take 1 tablet (10 mg total) by mouth daily.  Dispense: 90 tablet; Refill: 3 -     hydrALAZINE  HCl; Take 3 tablets (75 mg total) by mouth 3 (three) times daily.  Dispense: 90 tablet; Refill: 3 -     Losartan  Potassium; Take 1 tablet (25 mg total) by mouth daily.  Dispense: 90 tablet; Refill: 3  Hyperlipidemia, unspecified hyperlipidemia type -     Atorvastatin  Calcium ; Take 1 tablet (40 mg total) by mouth daily. for cholesterol.  Dispense: 90 tablet; Refill: 3 -     CBC with Differential/Platelet -     CMP14+EGFR -     Lipid panel  OSA (obstructive sleep apnea) -     CBC with Differential/Platelet -     CMP14+EGFR  Tobacco user -     CBC with Differential/Platelet -     CMP14+EGFR  Seizure disorder (HCC) -     Ambulatory referral to Neurology -     CBC with Differential/Platelet -     CMP14+EGFR -      TSH  Chronic nonintractable headache, unspecified headache type -     Topiramate ; Take 1 tablet (50 mg total) by mouth 2 (two) times daily.  Dispense: 180 tablet; Refill: 0 -     CBC with Differential/Platelet -     CMP14+EGFR -     Gabapentin ; Take 2 capsules (600 mg total) by mouth at bedtime.  Dispense: 180 capsule; Refill: 3  Alcohol abuse, in remission -     CBC with Differential/Platelet -     CMP14+EGFR -     Folic Acid ; Take 1 tablet (1 mg total) by mouth daily.  Dispense: 90 tablet; Refill: 3 -     Vitamin B-1; Take 1 tablet (100 mg total) by mouth daily.  Dispense: 100 tablet; Refill: 3  Benign prostatic hyperplasia with urinary frequency -     PSA, total and free  Vitamin D  deficiency -     VITAMIN D  25 Hydroxy (Vit-D Deficiency, Fractures)  Smoking greater than 20 pack years -     Ambulatory Referral for Lung Cancer Scre  Other orders -     Pantoprazole  Sodium; Take 1 tablet (40 mg total) by mouth daily.  Dispense: 90 tablet; Refill: 3 -     Aspirin ; Chew 1 tablet (81 mg total) by mouth daily.  Dispense: 90 tablet; Refill: 3 -     Docusate Sodium ; Take 10 mLs (100 mg total) by mouth 2 (two) times daily.  Dispense: 100 mL; Refill: 10 -     levETIRAcetam ; Take 1 tablet (500 mg total) by mouth 2 (two) times daily.  Dispense: 180 tablet; Refill: 3   53 minutes was spent with this patient.  More than one half and counseling and advising him regarding his medical conditions and treatments for these as well as arranging referrals medications etc.  Follow-up: Return in about 1 month (around 01/31/2024).  Butler Der, M.D.

## 2023-12-31 NOTE — Addendum Note (Signed)
 Addended by: JODENE CORNERS B on: 12/31/2023 01:22 PM   Modules accepted: Orders

## 2024-01-04 ENCOUNTER — Telehealth: Payer: Self-pay | Admitting: Family Medicine

## 2024-01-04 ENCOUNTER — Other Ambulatory Visit

## 2024-01-04 DIAGNOSIS — E559 Vitamin D deficiency, unspecified: Secondary | ICD-10-CM | POA: Diagnosis not present

## 2024-01-04 DIAGNOSIS — Z0000 Encounter for general adult medical examination without abnormal findings: Secondary | ICD-10-CM | POA: Diagnosis not present

## 2024-01-04 DIAGNOSIS — I10 Essential (primary) hypertension: Secondary | ICD-10-CM | POA: Diagnosis not present

## 2024-01-04 DIAGNOSIS — E785 Hyperlipidemia, unspecified: Secondary | ICD-10-CM | POA: Diagnosis not present

## 2024-01-04 LAB — URINALYSIS
Bilirubin, UA: NEGATIVE
Glucose, UA: NEGATIVE
Ketones, UA: NEGATIVE
Leukocytes,UA: NEGATIVE
Nitrite, UA: NEGATIVE
Specific Gravity, UA: 1.015 (ref 1.005–1.030)
Urobilinogen, Ur: 0.2 mg/dL (ref 0.2–1.0)
pH, UA: 5.5 (ref 5.0–7.5)

## 2024-01-04 NOTE — Telephone Encounter (Signed)
 Returned call to sister who reports she went to a different pharmacy and it has been taken care of.

## 2024-01-04 NOTE — Addendum Note (Signed)
 Addended by: JODENE CORNERS B on: 01/04/2024 01:38 PM   Modules accepted: Orders

## 2024-01-04 NOTE — Telephone Encounter (Signed)
 Patient went to pickup medications called in on 8-7 and was going to have to pay $54. Can't afford to pay that. Please call patient.

## 2024-01-05 ENCOUNTER — Other Ambulatory Visit

## 2024-01-05 LAB — CMP14+EGFR
ALT: 18 IU/L (ref 0–44)
AST: 28 IU/L (ref 0–40)
Albumin: 4.3 g/dL (ref 3.9–4.9)
Alkaline Phosphatase: 234 IU/L — ABNORMAL HIGH (ref 44–121)
BUN/Creatinine Ratio: 8 — ABNORMAL LOW (ref 10–24)
BUN: 16 mg/dL (ref 8–27)
Bilirubin Total: 0.4 mg/dL (ref 0.0–1.2)
CO2: 21 mmol/L (ref 20–29)
Calcium: 10.5 mg/dL — ABNORMAL HIGH (ref 8.6–10.2)
Chloride: 106 mmol/L (ref 96–106)
Creatinine, Ser: 2.11 mg/dL — ABNORMAL HIGH (ref 0.76–1.27)
Globulin, Total: 3.7 g/dL (ref 1.5–4.5)
Glucose: 102 mg/dL — ABNORMAL HIGH (ref 70–99)
Potassium: 4.6 mmol/L (ref 3.5–5.2)
Sodium: 142 mmol/L (ref 134–144)
Total Protein: 8 g/dL (ref 6.0–8.5)
eGFR: 33 mL/min/1.73 — ABNORMAL LOW (ref >59)

## 2024-01-05 LAB — CBC WITH DIFFERENTIAL/PLATELET
Basophils Absolute: 0.1 x10E3/uL (ref 0.0–0.2)
Basos: 1 %
EOS (ABSOLUTE): 1.2 x10E3/uL — ABNORMAL HIGH (ref 0.0–0.4)
Eos: 15 %
Hematocrit: 46.1 % (ref 37.5–51.0)
Hemoglobin: 15.5 g/dL (ref 13.0–17.7)
Immature Grans (Abs): 0 x10E3/uL (ref 0.0–0.1)
Immature Granulocytes: 0 %
Lymphocytes Absolute: 2.5 x10E3/uL (ref 0.7–3.1)
Lymphs: 31 %
MCH: 31.3 pg (ref 26.6–33.0)
MCHC: 33.6 g/dL (ref 31.5–35.7)
MCV: 93 fL (ref 79–97)
Monocytes Absolute: 0.8 x10E3/uL (ref 0.1–0.9)
Monocytes: 10 %
Neutrophils Absolute: 3.7 x10E3/uL (ref 1.4–7.0)
Neutrophils: 43 %
Platelets: 213 x10E3/uL (ref 150–450)
RBC: 4.95 x10E6/uL (ref 4.14–5.80)
RDW: 12.4 % (ref 11.6–15.4)
WBC: 8.2 x10E3/uL (ref 3.4–10.8)

## 2024-01-05 LAB — LIPID PANEL
Chol/HDL Ratio: 2.9 ratio (ref 0.0–5.0)
Cholesterol, Total: 116 mg/dL (ref 100–199)
HDL: 40 mg/dL (ref >39)
LDL Chol Calc (NIH): 55 mg/dL (ref 0–99)
Triglycerides: 112 mg/dL (ref 0–149)
VLDL Cholesterol Cal: 21 mg/dL (ref 5–40)

## 2024-01-05 LAB — TSH+FREE T4
Free T4: 1.36 ng/dL (ref 0.82–1.77)
TSH: 0.856 u[IU]/mL (ref 0.450–4.500)

## 2024-01-05 LAB — PSA, TOTAL AND FREE
PSA, Free Pct: 8.8 %
PSA, Free: 27 ng/mL
Prostate Specific Ag, Serum: 307 ng/mL — ABNORMAL HIGH (ref 0.0–4.0)

## 2024-01-05 LAB — VITAMIN D 25 HYDROXY (VIT D DEFICIENCY, FRACTURES): Vit D, 25-Hydroxy: 24.9 ng/mL — ABNORMAL LOW (ref 30.0–100.0)

## 2024-01-08 ENCOUNTER — Telehealth: Payer: Self-pay

## 2024-01-08 ENCOUNTER — Other Ambulatory Visit: Payer: Self-pay

## 2024-01-08 DIAGNOSIS — I10 Essential (primary) hypertension: Secondary | ICD-10-CM

## 2024-01-08 MED ORDER — HYDRALAZINE HCL 25 MG PO TABS: 75.0000 mg | ORAL_TABLET | Freq: Three times a day (TID) | ORAL | 3 refills | Status: AC

## 2024-01-08 NOTE — Telephone Encounter (Signed)
 Yes it looks like that is what he was discharged from the hospital on due to hypertensive emergency.  Please cosign increased quantity to PCP

## 2024-01-08 NOTE — Telephone Encounter (Signed)
 Prescription sent

## 2024-01-08 NOTE — Telephone Encounter (Signed)
 Please review and make sure directions are correct on Hydralazine  25- take 3 tablets three times a day.  If so can a 30 day be sent in stead?  Will send to pcp and covering

## 2024-01-08 NOTE — Telephone Encounter (Signed)
 Copied from CRM [id number]. Topic: Clinical - Prescription Issue >> Jan 08, 2024  9:38 AM Willma SAUNDERS wrote: Reason for CRM: Patients sister [name] is calling in regards to his prescription for hydrALAZINE  (APRESOLINE ) 25 MG tablet. It is only a 10 day supply and wants to know if it can be written as a 30 day supply because it is difficult to get to the pharmacy every 10 days.  [name] can be reached at [phone]

## 2024-01-10 ENCOUNTER — Ambulatory Visit: Payer: Self-pay | Admitting: Family Medicine

## 2024-01-10 NOTE — Progress Notes (Signed)
 Dear Lynwood KATHEE Mulch, Your Vitamin D  is  low. You need a prescription strength supplement I will send that in for you.   Also the prostate-specific antigen is very high.  He needs to see urology.  See if there is someone he would prefer.  Nurse, if at all possible, could you send in a prescription for the patient for vitamin D  50,000 units, 1 p.o. weekly #13 with 3 refills? Many thanks, WS

## 2024-01-11 MED ORDER — VITAMIN D (ERGOCALCIFEROL) 1.25 MG (50000 UNIT) PO CAPS: 50000.0000 [IU] | ORAL_CAPSULE | ORAL | 3 refills | Status: DC

## 2024-01-11 NOTE — Telephone Encounter (Signed)
 Patient aware and verbalized understanding.  Medication sent to pharmacy.   Patient wants urology referral in [place].

## 2024-01-17 ENCOUNTER — Other Ambulatory Visit: Payer: Self-pay | Admitting: Family Medicine

## 2024-01-17 DIAGNOSIS — R972 Elevated prostate specific antigen [PSA]: Secondary | ICD-10-CM | POA: Insufficient documentation

## 2024-01-17 NOTE — Progress Notes (Signed)
 Dear Philip [name], Your Vitamin D  is  low. You need a prescription strength supplement I will send that in for you.  Urology referral submitted.  Nurse, if at all possible, could you send in a prescription for the patient for vitamin D  50,000 units, 1 p.o. weekly #13 with 3 refills? Many thanks, WS

## 2024-01-18 NOTE — Telephone Encounter (Signed)
 Reviewed results with patient and patient voiced understanding. He has already picked up his Vitamin D  Rx and has already heard back from the Urologist. Waiting to receive some paperwork in the mail from them on next steps.

## 2024-01-27 ENCOUNTER — Ambulatory Visit: Admitting: Neurology

## 2024-01-28 ENCOUNTER — Ambulatory Visit (INDEPENDENT_AMBULATORY_CARE_PROVIDER_SITE_OTHER): Admitting: Family Medicine

## 2024-01-28 ENCOUNTER — Ambulatory Visit (INDEPENDENT_AMBULATORY_CARE_PROVIDER_SITE_OTHER)

## 2024-01-28 VITALS — BP 115/77 | HR 81 | Temp 97.8°F | Ht 67.0 in | Wt 146.0 lb

## 2024-01-28 DIAGNOSIS — M545 Low back pain, unspecified: Secondary | ICD-10-CM

## 2024-01-28 DIAGNOSIS — F17210 Nicotine dependence, cigarettes, uncomplicated: Secondary | ICD-10-CM

## 2024-01-28 DIAGNOSIS — R634 Abnormal weight loss: Secondary | ICD-10-CM

## 2024-01-28 MED ORDER — PREDNISONE 20 MG PO TABS
ORAL_TABLET | ORAL | 0 refills | Status: DC
Start: 2024-01-28 — End: 2024-03-16

## 2024-01-28 MED ORDER — TIZANIDINE HCL 4 MG PO TABS: 4.0000 mg | ORAL_TABLET | Freq: Four times a day (QID) | ORAL | 1 refills | Status: DC | PRN

## 2024-01-28 NOTE — Progress Notes (Signed)
 Subjective:  Patient ID: Philip [name], male    DOB: 18-Jun-1955  Age: 68 y.o. MRN: [id number]  CC: Back Pain   HPI  Discussed the use of AI scribe software for clinical note transcription with the patient, who gave verbal consent to proceed.  History of Present Illness Philip [name] is a 68 year old male who presents with mid-back pain following a recent hospitalization.  He has mid-back pain localized to the upper lumbar area, specifically around the L2 vertebra, without radiation to the abdomen or lower back.  He experiences stiffness and weakness in his legs, which he attributes to arthritis, impacting his ability to stand for extended periods. He has lost fourteen pounds, which he attributes to a reduced appetite and inability to eat hospital food during his stay. Since returning home, he has been eating better and wants to regain his weight and strength.  No nausea, vomiting, diarrhea, fever, or cough. He smokes cigarettes and has occasional alcohol use but denies any cocaine or drug use.          01/28/2024    9:37 AM 12/07/2023    2:00 PM 09/25/2023    1:06 PM  Depression screen PHQ 2/9  Decreased Interest 0 0 0  Down, Depressed, Hopeless 0 1 0  PHQ - 2 Score 0 1 0  Altered sleeping 0 0 0  Tired, decreased energy 0 0 0  Change in appetite 0 0 0  Feeling bad or failure about yourself  0 0 0  Trouble concentrating 0 0 0  Moving slowly or fidgety/restless 0 0 0  Suicidal thoughts 0 0 0  PHQ-9 Score 0 1 0  Difficult doing work/chores Not difficult at all Not difficult at all Not difficult at all    History Philip [name] has a past medical history of Chronic cholecystitis with calculus (02/04/2013), CVA (cerebral vascular accident) (09/16/2012), Diabetes mellitus without complication (HCC), Erectile dysfunction, Gallstones (01/27/2013), GERD (gastroesophageal reflux disease), H. pylori infection (09/28/2017), Headache(784.0), Hepatitis C, History of kidney stones, History of migraine,  Hypertension, Hypertension, Nausea, Seizures (HCC), Short-term memory loss, Sleep apnea, Stroke (HCC), Tobacco user, and Umbilical hernia (02/04/2013).   He has a past surgical history that includes Umbilical hernia repair (N/A, 03/09/2013); Colonoscopy (06/2014); Esophagogastroduodenoscopy (egd) with propofol  (N/A, 08/21/2015); Cholecystectomy (N/A, 03/09/2013); Esophagogastroduodenoscopy (egd) with propofol  (N/A, 03/10/2017); and biopsy (03/10/2017).   His family history includes Cancer in his cousin; Colon cancer in his maternal uncle; Heart disease in his maternal grandfather, maternal grandmother, paternal grandfather, and paternal grandmother; Hypertension in his mother.He reports that he has been smoking cigarettes. He started smoking about 45 years ago. He has a 9.1 pack-year smoking history. He has never used smokeless tobacco. He reports current alcohol use of about 7.0 standard drinks of alcohol per week. He reports that he does not use drugs.    ROS Review of Systems  Constitutional:  Negative for fever.  Respiratory:  Negative for shortness of breath.   Cardiovascular:  Negative for chest pain.  Gastrointestinal:  Positive for abdominal pain.  Musculoskeletal:  Positive for back pain. Negative for arthralgias.  Skin:  Negative for rash.    Objective:  BP 115/77   Pulse 81   Temp 97.8 F (36.6 C)   Ht 5' 7 (1.702 m)   Wt 146 lb (66.2 kg)   SpO2 97%   BMI 22.87 kg/m   BP Readings from Last 3 Encounters:  01/28/24 115/77  12/31/23 139/87  12/17/23 135/86  Wt Readings from Last 3 Encounters:  01/28/24 146 lb (66.2 kg)  12/31/23 163 lb (73.9 kg)  12/17/23 160 lb 4.4 oz (72.7 kg)     Physical Exam Vitals reviewed.  Constitutional:      Appearance: He is well-developed.  HENT:     Head: Normocephalic and atraumatic.     Right Ear: External ear normal.     Left Ear: External ear normal.     Mouth/Throat:     Pharynx: No oropharyngeal exudate or posterior  oropharyngeal erythema.  Eyes:     Pupils: Pupils are equal, round, and reactive to light.  Cardiovascular:     Rate and Rhythm: Normal rate and regular rhythm.     Heart sounds: No murmur heard. Pulmonary:     Effort: No respiratory distress.     Breath sounds: Normal breath sounds.  Musculoskeletal:     Cervical back: Normal range of motion and neck supple.  Skin:    Coloration: Skin is jaundiced.  Neurological:     Mental Status: He is alert and oriented to person, place, and time.      Assessment & Plan:  Acute midline low back pain without sciatica -     DG Lumbar Spine 2-3 Views; Future  Weight loss, non-intentional -     Ambulatory Referral for Lung Cancer Scre  Smoking greater than 20 pack years -     Ambulatory Referral for Lung Cancer Scre    Assessment and Plan Assessment & Plan Acute midline upper lumbar back pain   Pain is localized around L2, non-radiating, and not exacerbated by forward bending. There is no muscle tenderness. Order lumbar spine x-ray.  Lower extremity stiffness and weakness   Symptoms likely related to arthritis.  Unintentional weight loss   He has lost 14 pounds since hospitalization, possibly due to inability to eat hospital food and recent illness. There is no nausea, vomiting, diarrhea, or respiratory issues. No recent alcohol or cocaine use. Order CT scan of the chest to investigate potential causes of weight loss.  Recording duration: 8 minutes       Follow-up: No follow-ups on file.  Philip [name], M.D.

## 2024-01-31 ENCOUNTER — Encounter: Payer: Self-pay | Admitting: Family Medicine

## 2024-02-04 ENCOUNTER — Other Ambulatory Visit: Payer: Self-pay | Admitting: Family Medicine

## 2024-02-04 DIAGNOSIS — N529 Male erectile dysfunction, unspecified: Secondary | ICD-10-CM

## 2024-02-05 ENCOUNTER — Ambulatory Visit: Payer: Self-pay

## 2024-02-05 ENCOUNTER — Telehealth: Payer: Self-pay

## 2024-02-05 NOTE — Telephone Encounter (Signed)
 FYI noted

## 2024-02-05 NOTE — Telephone Encounter (Signed)
 Seen stacks please advise or okay to wait on stacks

## 2024-02-05 NOTE — Telephone Encounter (Signed)
 Looks like he was given a prednisone  taper when he saw Dr. Zollie, did not get that, is he taking?  Did he finish that they came back?  He can take Tylenol  arthritis as well, if still not improving then he may need to come back to be seen, we could also possibly discuss referral to physical therapy.

## 2024-02-05 NOTE — Telephone Encounter (Signed)
 Copied from CRM #[id number]. Topic: Referral - Request for Referral >> Feb 05, 2024  1:47 PM Rachelle R wrote: Did the patient discuss referral with their provider in the last year? Yes  Appointment offered? No  Type of order/referral and detailed reason for visit: Back Specialist for back pain  Preference of office, provider, location: Open to suggestions from Dr Zollie, would like someone in [place] or [place]  If referral order, have you been seen by this specialty before? No (If Yes, this issue or another issue? When? Where?  Can we respond through MyChart? Yes

## 2024-02-05 NOTE — Telephone Encounter (Signed)
 FYI Only or Action Required?: Action required by provider: request for appointment and referral request.  Patient was last seen in primary care on 01/28/2024 by Zollie Lowers, MD.  Called Nurse Triage reporting Back Pain.  Symptoms began several days ago.  Interventions attempted: Other: unsure if taking prescribed medications.  Symptoms are: gradually worsening.  Triage Disposition: See Physician Within 24 Hours  Patient/caregiver understands and will follow disposition?: Yes   Copied from CRM #[id number]. Topic: Clinical - Red Word Triage >> Feb 05, 2024  1:50 PM Rachelle R wrote: [facility] that prompted transfer to Nurse Triage: Patient is still experiencing pain in his back. Its so bad at times when walking he has to use a cane. Reason for Disposition  Numbness in a leg or foot (i.e., loss of sensation)  Answer Assessment - Initial Assessment Questions No available appts today. Advised UC/ED. Pt's sister reports will go to nearest UC.  Referral assistance with taking meds and for back pain. Back pain hurting when walking.    1. ONSET: When did the pain begin? (e.g., minutes, hours, days)     Pain got worse 3 days ago 2. LOCATION: Where does it hurt? (upper, mid or lower back)     Middle of lower back and sides of lower back, left leg 3. SEVERITY: How bad is the pain?  (e.g., Scale 1-10; mild, moderate, or severe)     Severe; because he's using the cane more to walk and using heating pad 4. PATTERN: Is the pain constant? (e.g., yes, no; constant, intermittent)      constant 5. RADIATION: Does the pain shoot into your legs or somewhere else?     Left leg 6. CAUSE:  What do you think is causing the back pain?      chronic 7. BACK OVERUSE:  Any recent lifting of heavy objects, strenuous work or exercise?     Chronic pain, got worse 8. MEDICINES: What have you taken so far for the pain? (e.g., nothing, acetaminophen , NSAIDS)     Pt's sister reports pre filling  pill container for the week, but patient is not taking as he should; needs assistance with taking pills.  9. NEUROLOGIC SYMPTOMS: Do you have any weakness, numbness, or problems with bowel/bladder control?    Denies  10. OTHER SYMPTOMS: Do you have any other symptoms? (e.g., fever, abdomen pain, burning with urination, blood in urine)       denies  Protocols used: Back Pain-A-AH

## 2024-02-05 NOTE — Telephone Encounter (Signed)
 Pts sister made aware of Dr. Sigurd recommendations. Pt is still in a lot of pain and does not think the prednisone  is helping.  He does not want to make an appt at this time. He will go to urgent care this weekend if things become worse. Sister states that he is thinking about going now.   Will route message to Dr. Zollie for Monday to see if he wants to evaluate pt again or refer out?

## 2024-02-06 ENCOUNTER — Other Ambulatory Visit: Payer: Self-pay | Admitting: Family Medicine

## 2024-02-06 DIAGNOSIS — N529 Male erectile dysfunction, unspecified: Secondary | ICD-10-CM

## 2024-02-06 DIAGNOSIS — M5442 Lumbago with sciatica, left side: Secondary | ICD-10-CM | POA: Diagnosis not present

## 2024-02-06 DIAGNOSIS — Z8639 Personal history of other endocrine, nutritional and metabolic disease: Secondary | ICD-10-CM | POA: Diagnosis not present

## 2024-02-06 DIAGNOSIS — R5383 Other fatigue: Secondary | ICD-10-CM | POA: Diagnosis not present

## 2024-02-07 ENCOUNTER — Ambulatory Visit: Payer: Self-pay | Admitting: Family Medicine

## 2024-02-08 ENCOUNTER — Ambulatory Visit: Payer: Self-pay

## 2024-02-08 ENCOUNTER — Ambulatory Visit: Admitting: Nurse Practitioner

## 2024-02-08 NOTE — Telephone Encounter (Signed)
 Left detailed message making patient aware of PCP recommendations and advised for patient to call back if he had additional questions.

## 2024-02-08 NOTE — Telephone Encounter (Signed)
 Appt made.

## 2024-02-08 NOTE — Telephone Encounter (Signed)
 I'm sorry to hear the prednisone  did not help. I would like for you to see an orthopedist. They have a walk in clinic at [facility] that does not require a referral.

## 2024-02-08 NOTE — Telephone Encounter (Signed)
 FYI Only or Action Required?: Action required by provider: request for appointment.  Patient was last seen in primary care on 01/28/2024 by Zollie Lowers, MD.  Called Nurse Triage reporting Back Pain.  Symptoms began several weeks ago.  Interventions attempted: Prescription medications: prednisone .  Symptoms are: unchanged.  Triage Disposition: See HCP Within 4 Hours (Or PCP Triage)  Patient/caregiver understands and will follow disposition?: YesCopied from CRM [id number]. Topic: Clinical - Red Word Triage >> Feb 08, 2024  9:34 AM Alfonso ORN wrote: Red Word that prompted transfer to Nurse Triage: probably with is back patient legs are real stiff, having thinking problems , not understanding , confusion   breada vernon (sister) is calling on behalf of patient Reason for Disposition  [1] SEVERE back pain (e.g., excruciating, unable to do any normal activities) AND [2] not improved 2 hours after pain medicine  Answer Assessment - Initial Assessment Questions Sister [name] called on behalf of pt. She is not with him currently. Appt today  with DOD.      1. ONSET: When did the pain begin? (e.g., minutes, hours, days)     2-3 weeks  2. LOCATION: Where does it hurt? (upper, mid or lower back)  Lower back  3. SEVERITY: How bad is the pain?  (e.g., Scale 1-10; mild, moderate, or severe)     Not sure 4. PATTERN: Is the pain constant? (e.g., yes, no; constant, intermittent)      Comes and goes 5. RADIATION: Does the pain shoot into your legs or somewhere else?     Legs  6. CAUSE:  What do you think is causing the back pain?      Not sure 7. BACK OVERUSE:  Any recent lifting of heavy objects, strenuous work or exercise?     No 8. MEDICINES: What have you taken so far for the pain? (e.g., nothing, acetaminophen , NSAIDS)     Prednisone   9. NEUROLOGIC SYMPTOMS: Do you have any weakness, numbness, or problems with bowel/bladder control?     Legs are numb 10. OTHER SYMPTOMS:  Do you have any other symptoms? (e.g., fever, abdomen pain, burning with urination, blood in urine)       Confusion  Protocols used: Back Pain-A-AH

## 2024-02-22 ENCOUNTER — Telehealth: Payer: Self-pay

## 2024-02-22 NOTE — Telephone Encounter (Signed)
 Request call back in one month so discuss the LCS program. Pt has back problems right now and would like to take care of that issue first.

## 2024-02-29 ENCOUNTER — Ambulatory Visit: Payer: Self-pay

## 2024-02-29 NOTE — Telephone Encounter (Signed)
 Appt made.

## 2024-02-29 NOTE — Telephone Encounter (Signed)
 FYI Only or Action Required?: FYI only for provider.  Patient was last seen in primary care on 01/28/2024 by Zollie Lowers, MD.  Called Nurse Triage reporting Back Pain.  Symptoms began several weeks ago.  Interventions attempted: Prescription medications: Muscle relaxer and prednisone .  Symptoms are: gradually worsening.  Triage Disposition: See PCP When Office is Open (Within 3 Days)  Patient/caregiver understands and will follow disposition?: Yes      Copied from CRM #[id number]. Topic: Clinical - Red Word Triage >> Feb 29, 2024  1:41 PM Rachelle R wrote: [facility] that prompted transfer to Nurse Triage: Patient has been having a lot of back pain, has been seen for it before but it is not getting any better. Has also been losing weight recently. Reason for Disposition  [1] Pain radiates into the thigh or further down the leg AND [2] one leg  Answer Assessment - Initial Assessment Questions This RN spoke with the patient's sister regarding symptoms. Patient taking Muscle relaxer and prednisone  for symptoms. Patient states the pain worsens with movement.   1. ONSET: When did the pain begin? (e.g., minutes, hours, days)     2-3 weeks  2. LOCATION: Where does it hurt? (upper, mid or lower back)     Lower back pain  3. SEVERITY: How bad is the pain?  (e.g., Scale 1-10; mild, moderate, or severe)     Mild  4. PATTERN: Is the pain constant? (e.g., yes, no; constant, intermittent)      Comes and goes  5. RADIATION: Does the pain shoot into your legs or somewhere else?     Into legs and legs get siff at times  6.UROLOGIC SYMPTOMS: Do you have any weakness, numbness, or problems with bowel/bladder control?     Weakness and numbness in back at times. Patient states he is going to see a urologist  10. OTHER SYMPTOMS: Do you have any other symptoms? (e.g., fever, abdomen pain, burning with urination, blood in urine)       Losing weight  Protocols used: Back Pain-A-AH

## 2024-03-02 ENCOUNTER — Encounter: Payer: Self-pay | Admitting: Family Medicine

## 2024-03-02 ENCOUNTER — Ambulatory Visit (INDEPENDENT_AMBULATORY_CARE_PROVIDER_SITE_OTHER): Admitting: Family Medicine

## 2024-03-02 VITALS — BP 112/75 | HR 88 | Temp 97.8°F | Ht 67.0 in | Wt 135.0 lb

## 2024-03-02 DIAGNOSIS — I10 Essential (primary) hypertension: Secondary | ICD-10-CM

## 2024-03-02 DIAGNOSIS — R972 Elevated prostate specific antigen [PSA]: Secondary | ICD-10-CM

## 2024-03-02 DIAGNOSIS — R634 Abnormal weight loss: Secondary | ICD-10-CM

## 2024-03-02 DIAGNOSIS — M5416 Radiculopathy, lumbar region: Secondary | ICD-10-CM

## 2024-03-02 MED ORDER — KETOROLAC TROMETHAMINE 60 MG/2ML IM SOLN
60.0000 mg | Freq: Once | INTRAMUSCULAR | Status: AC
  Administered 2024-03-02: 60 mg via INTRAMUSCULAR

## 2024-03-02 MED ORDER — BETAMETHASONE SOD PHOS & ACET 6 (3-3) MG/ML IJ SUSP
6.0000 mg | Freq: Once | INTRAMUSCULAR | Status: AC
  Administered 2024-03-02: 6 mg via INTRAMUSCULAR

## 2024-03-02 NOTE — Progress Notes (Signed)
 Subjective:  Patient ID: Philip [name], male    DOB: 1956-01-17  Age: 68 y.o. MRN: [id number]  CC: Back Pain (Weight loss, uncontrolled)   HPI  Discussed the use of AI scribe software for clinical note transcription with the patient, who gave verbal consent to proceed.  History of Present Illness Philip [name] is a 68 year old male with arthritis who presents with worsening back pain radiating to the left leg.  He has been experiencing worsening back pain for approximately five to six weeks, radiating down his left leg to his foot. The pain is described as a pressure that disrupts his sleep and is rated as an eight out of ten, with the most severe pain in the left thigh area, extending down the front, side, and back to the foot.  He experiences difficulty sitting for extended periods, necessitating frequent standing or walking to alleviate discomfort. Additionally, he reports a significant decrease in appetite, stating 'I don't want to eat nothing,' despite neighbors providing him with food.  An x-ray was performed last month and compared to one from four years ago. He is awaiting an appointment with a specialist in November for further evaluation. He has previously been referred for a CT scan of his lungs and has an upcoming appointment with a urologist for prostate concerns.  No burning or stinging sensation in the leg. He reports a pressure sensation in the left leg and denies similar symptoms in the right leg. He also reports decreased appetite.          03/02/2024   10:04 AM 01/28/2024    9:37 AM 12/07/2023    2:00 PM  Depression screen PHQ 2/9  Decreased Interest 2 0 0  Down, Depressed, Hopeless 2 0 1  PHQ - 2 Score 4 0 1  Altered sleeping 2 0 0  Tired, decreased energy 3 0 0  Change in appetite 3 0 0  Feeling bad or failure about yourself  2 0 0  Trouble concentrating 2 0 0  Moving slowly or fidgety/restless 2 0 0  Suicidal thoughts 0 0 0  PHQ-9 Score 18 0 1  Difficult  doing work/chores Very difficult Not difficult at all Not difficult at all    History Philip [name] has a past medical history of Chronic cholecystitis with calculus (02/04/2013), CVA (cerebral vascular accident) (09/16/2012), Diabetes mellitus without complication (HCC), Erectile dysfunction, Gallstones (01/27/2013), GERD (gastroesophageal reflux disease), H. pylori infection (09/28/2017), Headache(784.0), Hepatitis C, History of kidney stones, History of migraine, Hypertension, Hypertension, Nausea, Seizures (HCC), Short-term memory loss, Sleep apnea, Stroke (HCC), Tobacco user, and Umbilical hernia (02/04/2013).   He has a past surgical history that includes Umbilical hernia repair (N/A, 03/09/2013); Colonoscopy (06/2014); Esophagogastroduodenoscopy (egd) with propofol  (N/A, 08/21/2015); Cholecystectomy (N/A, 03/09/2013); Esophagogastroduodenoscopy (egd) with propofol  (N/A, 03/10/2017); and biopsy (03/10/2017).   His family history includes Cancer in his cousin; Colon cancer in his maternal uncle; Heart disease in his maternal grandfather, maternal grandmother, paternal grandfather, and paternal grandmother; Hypertension in his mother.He reports that he has been smoking cigarettes. He started smoking about 45 years ago. He has a 9.2 pack-year smoking history. He has never used smokeless tobacco. He reports current alcohol use of about 7.0 standard drinks of alcohol per week. He reports that he does not use drugs.    ROS Review of Systems  Constitutional:  Negative for fever.  Respiratory:  Negative for shortness of breath.   Cardiovascular:  Negative for chest pain.  Musculoskeletal:  Positive for  arthralgias, back pain and myalgias.  Skin:  Negative for rash.    Objective:  BP 112/75   Pulse 88   Temp 97.8 F (36.6 C)   Ht 5' 7 (1.702 m)   Wt 135 lb (61.2 kg)   SpO2 98%   BMI 21.14 kg/m   BP Readings from Last 3 Encounters:  03/02/24 112/75  01/28/24 115/77  12/31/23 139/87    Wt Readings  from Last 3 Encounters:  03/02/24 135 lb (61.2 kg)  01/28/24 146 lb (66.2 kg)  12/31/23 163 lb (73.9 kg)     Physical Exam Vitals reviewed.  Constitutional:      Appearance: He is well-developed.  HENT:     Head: Normocephalic and atraumatic.     Right Ear: External ear normal.     Left Ear: External ear normal.     Mouth/Throat:     Pharynx: No oropharyngeal exudate or posterior oropharyngeal erythema.  Eyes:     Pupils: Pupils are equal, round, and reactive to light.  Cardiovascular:     Rate and Rhythm: Normal rate and regular rhythm.     Heart sounds: No murmur heard. Pulmonary:     Effort: No respiratory distress.     Breath sounds: Normal breath sounds.  Musculoskeletal:     Cervical back: Normal range of motion and neck supple.  Neurological:     Mental Status: He is alert and oriented to person, place, and time.    Physical Exam GENERAL: Alert, cooperative, well developed, no acute distress HEENT: Normocephalic, normal oropharynx, moist mucous membranes CHEST: Clear to auscultation bilaterally, No wheezes, rhonchi, or crackles CARDIOVASCULAR: Normal heart rate and rhythm, S1 and S2 normal without murmurs ABDOMEN: Soft, non-tender, non-distended, without organomegaly, Normal bowel sounds EXTREMITIES: No cyanosis or edema NEUROLOGICAL: Cranial nerves grossly intact, Moves all extremities without gross motor or sensory deficit   Assessment & Plan:  Lumbar radiculopathy -     Ambulatory referral to Orthopedics -     MR LUMBAR SPINE WO CONTRAST; Future -     Ambulatory referral to Physical Therapy -     Betamethasone Sod Phos & Acet -     Ketorolac Tromethamine  Abnormal PSA  Primary hypertension  Left lumbar radiculopathy  Weight loss, non-intentional    Assessment and Plan Assessment & Plan Left lumbar radiculopathy with associated pain and weakness   He experiences worsening left lumbar radiculopathy with pain and weakness, described as  pressure-like and rated 8/10. The pain radiates from the left thigh to the foot, suggesting a possible herniated disc. Symptoms have persisted for six weeks, causing significant discomfort and sleep disturbance. Refer to a spine specialist for further evaluation and management. Order an MRI of the lumbar spine to assess for a herniated disc. Administer a cortisone shot for immediate and long-term pain relief. Initiate physical therapy to address pain and improve function.  Lumbar spondylosis   Lumbar spondylosis shows worsening arthritis over the past four years, as seen on recent x-ray compared to one from four years ago.  Unintentional weight loss and anorexia   He has unintentional weight loss and anorexia with a loss of appetite. Awaiting evaluation by Dr. Sherrilee for potential prostate issues and a CT scan of the lungs to investigate underlying causes. Proceed with the CT scan of the lungs to investigate potential causes of weight loss and anorexia. Follow up with Dr. Sherrilee for evaluation of prostate concerns.       Follow-up: Return in about 1 month (  around 04/02/2024) for Pain.  Butler Der, M.D.

## 2024-03-03 ENCOUNTER — Other Ambulatory Visit: Payer: Self-pay | Admitting: Family Medicine

## 2024-03-03 DIAGNOSIS — I10 Essential (primary) hypertension: Secondary | ICD-10-CM

## 2024-03-03 NOTE — Telephone Encounter (Unsigned)
 Copied from CRM #[id number]. Topic: Clinical - Medication Refill >> Mar 03, 2024 10:29 AM Delon DASEN wrote: Medication: hydrALAZINE  (APRESOLINE ) 25 MG tablet   Has the patient contacted their pharmacy? Yes (Agent: If no, request that the patient contact the pharmacy for the refill. If patient does not wish to contact the pharmacy document the reason why and proceed with request.) (Agent: If yes, when and what did the pharmacy advise?)  This is the patient's preferred pharmacy:  [id number] - [place], [place] - [address] [place] [address] Phone: [phone] Fax: [phone]    Is this the correct pharmacy for this prescription? Yes If no, delete pharmacy and type the correct one.   Has the prescription been filled recently? Yes  Is the patient out of the medication? Yes  Has the patient been seen for an appointment in the last year OR does the patient have an upcoming appointment? Yes  Can we respond through MyChart? Yes  Agent: Please be advised that Rx refills may take up to 3 business days. We ask that you follow-up with your pharmacy.

## 2024-03-14 ENCOUNTER — Ambulatory Visit (HOSPITAL_COMMUNITY)
Admission: RE | Admit: 2024-03-14 | Discharge: 2024-03-14 | Disposition: A | Discharge status: Home / Self Care | Source: Ambulatory Visit | Attending: Family Medicine | Admitting: Family Medicine

## 2024-03-14 DIAGNOSIS — M4727 Other spondylosis with radiculopathy, lumbosacral region: Secondary | ICD-10-CM | POA: Diagnosis not present

## 2024-03-14 DIAGNOSIS — M5116 Intervertebral disc disorders with radiculopathy, lumbar region: Secondary | ICD-10-CM | POA: Diagnosis not present

## 2024-03-14 DIAGNOSIS — I700 Atherosclerosis of aorta: Secondary | ICD-10-CM | POA: Diagnosis not present

## 2024-03-14 DIAGNOSIS — M5416 Radiculopathy, lumbar region: Secondary | ICD-10-CM | POA: Insufficient documentation

## 2024-03-14 DIAGNOSIS — M545 Low back pain, unspecified: Secondary | ICD-10-CM | POA: Diagnosis not present

## 2024-03-16 ENCOUNTER — Encounter: Payer: Self-pay | Admitting: Family Medicine

## 2024-03-16 ENCOUNTER — Telehealth: Payer: Self-pay

## 2024-03-16 ENCOUNTER — Ambulatory Visit: Admitting: Family Medicine

## 2024-03-16 VITALS — BP 108/70 | HR 86 | Temp 98.5°F | Ht 67.0 in | Wt 130.4 lb

## 2024-03-16 DIAGNOSIS — C7949 Secondary malignant neoplasm of other parts of nervous system: Secondary | ICD-10-CM | POA: Diagnosis not present

## 2024-03-16 DIAGNOSIS — G893 Neoplasm related pain (acute) (chronic): Secondary | ICD-10-CM

## 2024-03-16 DIAGNOSIS — C801 Malignant (primary) neoplasm, unspecified: Secondary | ICD-10-CM

## 2024-03-16 DIAGNOSIS — R972 Elevated prostate specific antigen [PSA]: Secondary | ICD-10-CM

## 2024-03-16 DIAGNOSIS — M8458XD Pathological fracture in neoplastic disease, other specified site, subsequent encounter for fracture with routine healing: Secondary | ICD-10-CM | POA: Diagnosis not present

## 2024-03-16 DIAGNOSIS — M8458XA Pathological fracture in neoplastic disease, other specified site, initial encounter for fracture: Secondary | ICD-10-CM

## 2024-03-16 MED ORDER — OXYCODONE-ACETAMINOPHEN 7.5-325 MG PO TABS: 1.0000 | ORAL_TABLET | ORAL | 0 refills | Status: DC | PRN

## 2024-03-16 MED ORDER — BETAMETHASONE SOD PHOS & ACET 6 (3-3) MG/ML IJ SUSP
6.0000 mg | Freq: Once | INTRAMUSCULAR | Status: AC
  Administered 2024-03-16: 6 mg via INTRAMUSCULAR

## 2024-03-16 MED ORDER — KETOROLAC TROMETHAMINE 60 MG/2ML IM SOLN
60.0000 mg | Freq: Once | INTRAMUSCULAR | Status: AC
  Administered 2024-03-16: 60 mg via INTRAMUSCULAR

## 2024-03-16 NOTE — Telephone Encounter (Signed)
 Copied from CRM #[id number]. Topic: Clinical - Lab/Test Results >> Mar 16, 2024  1:37 PM Emylou G wrote: Reason for CRM: MJ from [facility] [phone] Abnormal findings - report for MRI has been released ( they would like us  to review it asap )

## 2024-03-16 NOTE — Progress Notes (Signed)
 Subjective:  Patient ID: Philip [name], male    DOB: 13-Jan-1956  Age: 68 y.o. MRN: [id number]  CC: Back Pain   HPI  Discussed the use of AI scribe software for clinical note transcription with the patient, who gave verbal consent to proceed.  History of Present Illness  Pt. In for continued pain and for report from MRI of the spine. He has intractable 8=10/10 pain continually.   MR revealed extensive involvement of metastatic  tumor/ cancer.Pain sourced from all levels, but worst likely from a pathologic fracture at L5. Also extensive tumor noted at the right iliac bone and musculature. Nerve involvement possible  also per report.   Pt. Has upcoming appt. With Urology regarding PSA of 300.          03/25/2024   10:15 AM 03/22/2024    8:37 AM 03/02/2024   10:04 AM  Depression screen PHQ 2/9  Decreased Interest 0 0 2  Down, Depressed, Hopeless 0 0 2  PHQ - 2 Score 0 0 4  Altered sleeping 0 0 2  Tired, decreased energy 1 0 3  Change in appetite 1 0 3  Feeling bad or failure about yourself  0 0 2  Trouble concentrating 0 0 2  Moving slowly or fidgety/restless 0 0 2  Suicidal thoughts 0 0 0  PHQ-9 Score 2 0 18  Difficult doing work/chores   Very difficult    History Khup has a past medical history of Chronic cholecystitis with calculus (02/04/2013), CVA (cerebral vascular accident) (09/16/2012), Diabetes mellitus without complication (HCC), Erectile dysfunction, Gallstones (01/27/2013), GERD (gastroesophageal reflux disease), H. pylori infection (09/28/2017), Headache(784.0), Hepatitis C, History of kidney stones, History of migraine, Hypertension, Hypertension, Nausea, Seizures (HCC), Short-term memory loss, Sleep apnea, Stroke (HCC), Tobacco user, and Umbilical hernia (02/04/2013).   He has a past surgical history that includes Umbilical hernia repair (N/A, 03/09/2013); Colonoscopy (06/2014); Esophagogastroduodenoscopy (egd) with propofol  (N/A, 08/21/2015); Cholecystectomy (N/A,  03/09/2013); Esophagogastroduodenoscopy (egd) with propofol  (N/A, 03/10/2017); and biopsy (03/10/2017).   His family history includes Cancer in his cousin; Colon cancer in his maternal uncle; Heart disease in his maternal grandfather, maternal grandmother, paternal grandfather, and paternal grandmother; Hypertension in his mother.He reports that he has been smoking cigarettes. He started smoking about 45 years ago. He has a 9.2 pack-year smoking history. He has never used smokeless tobacco. He reports current alcohol use of about 7.0 standard drinks of alcohol per week. He reports that he does not use drugs.    ROS Review of Systems  Constitutional: Negative.   Musculoskeletal:  Positive for arthralgias and back pain.    Objective:  BP 108/70   Pulse 86   Temp 98.5 F (36.9 C)   Ht 5' 7 (1.702 m)   Wt 130 lb 6.4 oz (59.1 kg)   SpO2 96%   BMI 20.42 kg/m   BP Readings from Last 3 Encounters:  03/25/24 126/86  03/25/24 91/68  03/22/24 97/69    Wt Readings from Last 3 Encounters:  03/25/24 125 lb (56.7 kg)  03/22/24 125 lb (56.7 kg)  03/16/24 130 lb 6.4 oz (59.1 kg)     Physical Exam Constitutional:      General: He is in acute distress.     Appearance: He is ill-appearing and diaphoretic. He is not toxic-appearing.  HENT:     Head: Normocephalic and atraumatic.  Cardiovascular:     Rate and Rhythm: Normal rate and regular rhythm.  Musculoskeletal:  General: Normal range of motion.  Skin:    General: Skin is warm.  Neurological:     General: No focal deficit present.     Mental Status: He is alert and oriented to person, place, and time.  Psychiatric:        Behavior: Behavior normal.    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 Due  to metastatic cancer to spine  Dispense: 60 tablet; Refill: 0 -     Ketorolac Tromethamine  Pathological fracture of vertebra due to neoplastic disease, initial encounter -     oxyCODONE -Acetaminophen ; Take 1 tablet by mouth every 4 (four) hours as needed for severe pain (pain score 7-10). Due to metastatic cancer to spine  Dispense: 60 tablet; Refill: 0 -     Ketorolac Tromethamine -     Betamethasone Sod Phos & Acet  Abnormal PSA  Cancer-related pain -     oxyCODONE -Acetaminophen ; Take 1 tablet by mouth every 4 (four) hours as needed for severe pain (pain score 7-10). Due to metastatic cancer to spine  Dispense: 60 tablet; Refill: 0 -     Ketorolac Tromethamine   31 minutes spent with pt. Over 1/2 in extensive extensive discussion of test result, pain, treatment plan and prognosis preformed.  Follow-up: Return if symptoms worsen or fail to improve. Referrals to cancer specialists were made.   Butler Der, M.D.

## 2024-03-17 ENCOUNTER — Telehealth: Payer: Self-pay | Admitting: Family Medicine

## 2024-03-17 ENCOUNTER — Ambulatory Visit: Payer: Self-pay | Admitting: Family Medicine

## 2024-03-17 NOTE — Telephone Encounter (Signed)
 Copied from CRM [id number]. Topic: General - Other >> Mar 17, 2024 10:57 AM Zebedee SAUNDERS wrote: Reason for CRM: Pt's sister Will America [phone] or [phone] called for an update on pt's visit. Pt stated to sister he did not recall what was discussed. Please call Ms. Will.

## 2024-03-17 NOTE — Telephone Encounter (Signed)
 I called and spoke with Ms Philip [name] and made her aware that patient was referred to [facility] and has an appt there on 10/28 at 8:30 AM and per referral diagnosis, it looks like cancer has spread to patients spine, which is what was discussed at visit. Ms Philip [name] voiced understanding.

## 2024-03-22 ENCOUNTER — Inpatient Hospital Stay: Attending: Oncology | Admitting: Oncology

## 2024-03-22 ENCOUNTER — Inpatient Hospital Stay

## 2024-03-22 ENCOUNTER — Other Ambulatory Visit: Payer: Self-pay | Admitting: Oncology

## 2024-03-22 ENCOUNTER — Telehealth: Payer: Self-pay

## 2024-03-22 VITALS — BP 97/69 | HR 90 | Temp 98.1°F | Resp 18 | Ht 67.0 in | Wt 125.0 lb

## 2024-03-22 DIAGNOSIS — C7951 Secondary malignant neoplasm of bone: Secondary | ICD-10-CM | POA: Diagnosis not present

## 2024-03-22 DIAGNOSIS — M898X0 Other specified disorders of bone, multiple sites: Secondary | ICD-10-CM | POA: Diagnosis not present

## 2024-03-22 DIAGNOSIS — R972 Elevated prostate specific antigen [PSA]: Secondary | ICD-10-CM | POA: Insufficient documentation

## 2024-03-22 DIAGNOSIS — E291 Testicular hypofunction: Secondary | ICD-10-CM | POA: Insufficient documentation

## 2024-03-22 DIAGNOSIS — M5442 Lumbago with sciatica, left side: Secondary | ICD-10-CM | POA: Diagnosis not present

## 2024-03-22 DIAGNOSIS — F17210 Nicotine dependence, cigarettes, uncomplicated: Secondary | ICD-10-CM | POA: Insufficient documentation

## 2024-03-22 DIAGNOSIS — Z800 Family history of malignant neoplasm of digestive organs: Secondary | ICD-10-CM | POA: Diagnosis not present

## 2024-03-22 DIAGNOSIS — Z5111 Encounter for antineoplastic chemotherapy: Secondary | ICD-10-CM | POA: Insufficient documentation

## 2024-03-22 DIAGNOSIS — M549 Dorsalgia, unspecified: Secondary | ICD-10-CM | POA: Insufficient documentation

## 2024-03-22 DIAGNOSIS — R634 Abnormal weight loss: Secondary | ICD-10-CM | POA: Insufficient documentation

## 2024-03-22 DIAGNOSIS — G8929 Other chronic pain: Secondary | ICD-10-CM

## 2024-03-22 DIAGNOSIS — M899 Disorder of bone, unspecified: Secondary | ICD-10-CM

## 2024-03-22 LAB — COMPREHENSIVE METABOLIC PANEL WITH GFR
ALT: 9 U/L (ref 0–44)
AST: 75 U/L — ABNORMAL HIGH (ref 15–41)
Albumin: 4.2 g/dL (ref 3.5–5.0)
Alkaline Phosphatase: 269 U/L — ABNORMAL HIGH (ref 38–126)
Anion gap: 14 (ref 5–15)
BUN: 22 mg/dL (ref 8–23)
CO2: 26 mmol/L (ref 22–32)
Calcium: 10.5 mg/dL — ABNORMAL HIGH (ref 8.9–10.3)
Chloride: 105 mmol/L (ref 98–111)
Creatinine, Ser: 1.26 mg/dL — ABNORMAL HIGH (ref 0.61–1.24)
GFR, Estimated: >60 mL/min (ref >60)
Glucose, Bld: 172 mg/dL — ABNORMAL HIGH (ref 70–99)
Potassium: 3.7 mmol/L (ref 3.5–5.1)
Sodium: 145 mmol/L (ref 135–145)
Total Bilirubin: 0.9 mg/dL (ref 0.0–1.2)
Total Protein: 7.8 g/dL (ref 6.5–8.1)

## 2024-03-22 LAB — CBC WITH DIFFERENTIAL/PLATELET
Abs Immature Granulocytes: 0.13 K/uL — ABNORMAL HIGH (ref 0.00–0.07)
Basophils Absolute: 0 K/uL (ref 0.0–0.1)
Basophils Relative: 0 %
Eosinophils Absolute: 0.1 K/uL (ref 0.0–0.5)
Eosinophils Relative: 1 %
HCT: 43.2 % (ref 39.0–52.0)
Hemoglobin: 13.9 g/dL (ref 13.0–17.0)
Immature Granulocytes: 1 %
Lymphocytes Relative: 17 %
Lymphs Abs: 1.5 K/uL (ref 0.7–4.0)
MCH: 30.2 pg (ref 26.0–34.0)
MCHC: 32.2 g/dL (ref 30.0–36.0)
MCV: 93.7 fL (ref 80.0–100.0)
Monocytes Absolute: 0.9 K/uL (ref 0.1–1.0)
Monocytes Relative: 9 %
Neutro Abs: 6.7 K/uL (ref 1.7–7.7)
Neutrophils Relative %: 72 %
Platelets: 205 K/uL (ref 150–400)
RBC: 4.61 MIL/uL (ref 4.22–5.81)
RDW: 15 % (ref 11.5–15.5)
WBC: 9.3 K/uL (ref 4.0–10.5)
nRBC: 0 % (ref 0.0–0.2)

## 2024-03-22 LAB — PSA: Prostatic Specific Antigen: 911.34 ng/mL — ABNORMAL HIGH (ref 0.00–4.00)

## 2024-03-22 LAB — LACTATE DEHYDROGENASE: LDH: 1239 U/L — ABNORMAL HIGH (ref 98–192)

## 2024-03-22 NOTE — Assessment & Plan Note (Addendum)
--   Etiology likely secondary to metastatic disease. --Currently taking Percocet every 4 hours which is not helping --Discussed long-acting pain medicine such as MS Contin  15 mg every 12 hours.  -- New Rx sent to pharmacy.

## 2024-03-22 NOTE — Telephone Encounter (Signed)
 Patient Is on cozaar  25mg . Hold for the next few days and keep a check of bloodpressure at home.If gos above 140 systolic  let us  know please

## 2024-03-22 NOTE — Telephone Encounter (Signed)
 Copied from CRM [id number]. Topic: Clinical - Medication Question >> Mar 22, 2024 12:10 PM Delon T wrote: Reason for CRM: Patient was told by the Cancer Center that he may be taking too much blood pressure meds, blood pressure at appt was 97/69, please call sister [name] to advise [phone]

## 2024-03-22 NOTE — Telephone Encounter (Signed)
 Left detailed message with sidter, [name] Misty, per signed DPR. Encouraged call back or send us  a Mychart message if there are any questions.

## 2024-03-22 NOTE — Patient Instructions (Addendum)
 [facility]  Discharge Instructions  Rapid Diagnostic Service Visit Discharge Information and Instructions  Thank you for choosing [facility] for your healthcare needs.  Below is a summary of today's discussion, along with our contact information and an outline of what to expect next.  Reason for Visit:  Spinal lesions found on MRI.  Proposed Diagnostic Care Plan: Labs today. 2.  We will schedule you for a PET scan. 3.  We will start you on Firmagon injections on Friday 03/25/2024. 4.  We will send in a new long acting pain medication for you to start.   What to Expect: - Generally, when lab tests are ordered the results can take up to 1 week for results to be available.  At that point, we will contact you to discuss your results with you.  Unless there is a critical result, we will typically wait for all of your lab results to be available before contacting you. - If a biopsy is part of your Care Plan, those results can take on average 7-10 days to result.  Once results are available, we will contact you to discuss your pathology results and any next steps. - If you have additional imaging ordered, such as a CT Scan, MRI, Ultrasound, Bone Scan, or PET scan, your imaging will need to be authorized then scheduled with the earliest available appointment.  You may be asked to travel to another hospital within [facility] who has a sooner availability, please consider doing so if asked. - If you use MyChart, your results will be available to you in the MyChart portal.  Your provider will be in touch with you as soon as all of your results are available to be discussed.  Your Diagnostic Clinic Provider:  Delon Hope, NP. Your Diagnostic Navigator:  Dena Daring, RN.  Contact number [phone].  If you or your caregiver have number blocking on your cell phones, please ensure the cancer center's numbers are not blocked.  If you are not a registered MyChart  user, please consider enrolling in MyChart to receive your test results and visit notes.  You can also access your discharge instructions electronically.  MyChart also gives you an electronic means to communicate with your Care Team instead of needing to call in to the cancer center.  We appreciate you trusting us  with your healthcare and look forward to partnering with you as we work to uncover what your potential diagnosis may be.  Please do not hesitate to reach out at any point with questions or concerns.      Thank you for choosing [facility] to provide your oncology and hematology care.   To afford each patient quality time with our provider, please arrive at least 15 minutes before your scheduled appointment time. You may need to reschedule your appointment if you arrive late (10 or more minutes). Arriving late affects you and other patients whose appointments are after yours.  Also, if you miss three or more appointments without notifying the office, you may be dismissed from the clinic at the provider's discretion.    Again, thank you for choosing [facility].  Our hope is that these requests will decrease the amount of time that you wait before being seen by our physicians.   If you have a lab appointment with the Cancer Center - please note that after April 8th, all labs will be drawn in the cancer center.  You do not have to check in or register with the main entrance as you have in the past but will complete your check-in at the cancer center.            _____________________________________________________________  Should you have questions after your visit to [facility], please contact our office at [phone] and follow the prompts.  Our office hours are 8:00 a.m. to 4:30 p.m. Monday - Thursday and 8:00 a.m. to 2:30 p.m. Friday.  Please note that voicemails left after 4:00 p.m. may not be returned until the following  business day.  We are closed weekends and all major holidays.  You do have access to a nurse 24-7, just call the main number to the clinic [phone] and do not press any options, hold on the line and a nurse will answer the phone.    For prescription refill requests, have your pharmacy contact our office and allow 72 hours.    Masks are no longer required in the cancer centers. If you would like for your care team to wear a mask while they are taking care of you, please let them know. You may have one support person who is at least 68 years old accompany you for your appointments.

## 2024-03-22 NOTE — Addendum Note (Signed)
 Addended by: Katianne Barre on: 03/22/2024 08:50 PM   Modules accepted: Level of Service

## 2024-03-22 NOTE — Assessment & Plan Note (Addendum)
--  Discussed case with Dr. 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 --Labs today include CBC, CMP, LDH, testosterone to confirm. --PSA greater than 300. --Recommend starting Firmagon ASAP.  Once PSA is less than 200, can initiate Eligard as well. --Patient will need PSMA-PET scan --RTC once workup is complete

## 2024-03-22 NOTE — Progress Notes (Signed)
 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 male with medical history significant for chronic cholecystitis with calculus (02/04/2013), CVA (cerebral vascular accident) (09/16/2012), Diabetes mellitus without complication (HCC), Erectile dysfunction, Gallstones (01/27/2013), GERD (gastroesophageal reflux disease), H. pylori infection (09/28/2017), Headache(784.0), Hepatitis C, History of kidney stones, History of migraine, Hypertension, Hypertension, Nausea, Seizures (HCC), Short-term memory loss, Sleep apnea, Stroke (HCC), Tobacco user, and Umbilical hernia (02/04/2013).    He has a past surgical history that includes Umbilical hernia repair (N/A, 03/09/2013); Colonoscopy (06/2014); Esophagogastroduodenoscopy (egd) with propofol  (N/A, 08/21/2015); Cholecystectomy (N/A, 03/09/2013); Esophagogastroduodenoscopy (egd) with propofol  (N/A, 03/10/2017); and biopsy (03/10/2017).    His family history includes Cancer in his cousin; Colon cancer in his maternal uncle; Heart disease in his maternal grandfather, maternal grandmother, paternal grandfather, and paternal grandmother; Hypertension in his mother.He reports that he has been smoking cigarettes. He started smoking about 45 years ago. He has a 9.2 pack-year smoking history. He has never used smokeless tobacco. He reports current alcohol use of about 7.0 standard drinks of alcohol per week. He reports that he does not use drugs.  Patient was recently evaluated by Dr. Zollie for back pain that radiated down left leg x 6 weeks.  He also reports decreased appetite.   Reports sitting for extended periods requiring frequent standing or walking to alleviate discomfort.  Reports a pressure sensation in left leg.  He received Toradol injection and betamethasone.  Patient has elevated PSA 307.0 with free PSA 27.0 and free percent 8.8.  Patient had x-ray of lumbar spine which showed no acute fracture or malalignment.  Mild degenerative changes of lumbar spine.  MRI showed advanced and widespread osseous metastatic disease involving the lumbar spine, sacrum and iliac bones with associated pathologic fracture at L1 and L5 and extraosseous tumor extension at L5 and S1 with potential nerve involvement.  Extensive right iliac osseous lesion with cystic and solid components extending into adjacent soft tissue presumed advanced tumor.  Patient reports significant weight loss from about 215 pounds to 125 today.  No appetite and no energy.  Reports he mainly is sitting or laying down.  Reports pain in his low back and left leg is significant.  He is currently taking Percocet every 4 hours but it makes him feel funny.   MEDICAL HISTORY:  Past Medical History:  Diagnosis Date   Chronic cholecystitis with calculus 02/04/2013   CVA (cerebral vascular accident) 09/16/2012   Diabetes mellitus without complication (HCC)    borderline   Erectile dysfunction    Gallstones 01/27/2013   GERD (gastroesophageal reflux disease)    H. pylori infection 09/28/2017   Headache(784.0)    Headache, I dont think they are migranes   Hepatitis C    Harvoni , completed 06/2015 (Dr. Efrain) , AUG 2017 SVR   History of kidney stones    history of kidney stones   History of migraine    takes Topamax  daily   Hypertension    takes Hydralazine ,Maxzide ,Clonidine ,and Amlodipine  daily   Hypertension    also takes Metoprolol  daily   Nausea    takes Zofran  daily   Seizures (HCC)    off Dilantin  daily.  last  one was when he had a stroke.   Short-term memory loss    due to stroke    Sleep apnea     CPAP- tries to do it everyday.   Stroke (HCC)    .  Short term memory loss.; left sided weakness   Tobacco user    Umbilical hernia 02/04/2013    SURGICAL HISTORY: Past Surgical History:  Procedure Laterality Date   BIOPSY  03/10/2017   Procedure: BIOPSY;  Surgeon: Harvey Margo CROME, MD;  Location: [facility];  Service: Endoscopy;;  gastric biopsy    CHOLECYSTECTOMY N/A 03/09/2013   Procedure: LAPAROSCOPIC CHOLECYSTECTOMY WITH INTRAOPERATIVE CHOLANGIOGRAM;  Surgeon: Donnice POUR. Belinda, MD;  Location: [facility] OR;  Service: General;  Laterality: N/A;   COLONOSCOPY  06/2014   Dr. Obie: Three sessile polyps were found in the rectum x2 and descending. tubular adenomas. colonoscopy in 06/2019   ESOPHAGOGASTRODUODENOSCOPY (EGD) WITH PROPOFOL  N/A 08/21/2015   Dr. Harvey: normal esophagus, non-bleeding gastric ulcers/gastritis (H.pylori)    ESOPHAGOGASTRODUODENOSCOPY (EGD) WITH PROPOFOL  N/A 03/10/2017   Procedure: ESOPHAGOGASTRODUODENOSCOPY (EGD) WITH PROPOFOL ;  Surgeon: Harvey Margo CROME, MD;  Location: [facility];  Service: Endoscopy;  Laterality: N/A;  1:00pm-rescheduled to 10\16 @ 8:15am    UMBILICAL HERNIA REPAIR N/A 03/09/2013   Procedure: HERNIA REPAIR UMBILICAL ADULT;  Surgeon: Donnice POUR. Belinda, MD;  Location: [facility] OR;  Service: General;  Laterality: N/A;    SOCIAL HISTORY: Social History   Socioeconomic History   Marital status: Single    Spouse name: Not on file   Number of children: 1   Years of education: Not on file   Highest education level: Not on file  Occupational History   Occupation: retired  Tobacco Use   Smoking status: Every Day    Current packs/day: 0.20    Average packs/day: 0.2 packs/day for 45.8 years (9.2 ttl pk-yrs)    Types: Cigarettes    Start date: 05/26/1978   Smokeless tobacco: Never   Tobacco comments:    slowing down  Vaping Use   Vaping status: Never Used  Substance and Sexual Activity   Alcohol use: Yes    Alcohol/week: 7.0 standard drinks of alcohol     Types: 6 Cans of beer, 1 Standard drinks or equivalent per week    Comment: drinks daily - usually two 40oz beers per day   Drug use: No    Frequency: 1.0 times per week   Sexual activity: Not Currently    Birth control/protection: None  Other Topics Concern   Not on file  Social History Narrative   Daughter lives in [place]   Social Drivers of Health   Financial Resource Strain: Low Risk  (12/07/2023)   Overall Financial Resource Strain (CARDIA)    Difficulty of Paying Living Expenses: Not hard at all  Food Insecurity: No Food Insecurity (12/07/2023)   Hunger Vital Sign    Worried About Running Out of Food in the Last Year: Never true    Ran Out of Food in the Last Year: Never true  Transportation Needs: No Transportation Needs (12/07/2023)   PRAPARE - Administrator, Civil Service (Medical): No    Lack of Transportation (Non-Medical): No  Physical Activity: Insufficiently Active (12/07/2023)   Exercise Vital Sign    Days of Exercise per Week: 7 days    Minutes of Exercise per Session: 20 min  Stress: No Stress Concern Present (12/07/2023)   [employer] of Occupational Health - Occupational Stress Questionnaire  Feeling of Stress: Not at all  Social Connections: Socially Isolated (12/07/2023)   Social Connection and Isolation Panel    Frequency of Communication with Friends and Family: More than three times a week    Frequency of Social Gatherings with Friends and Family: More than three times a week    Attends Religious Services: Never    Database Administrator or Organizations: No    Attends Banker Meetings: Never    Marital Status: Never married  Intimate Partner Violence: Not At Risk (12/07/2023)   Humiliation, Afraid, Rape, and Kick questionnaire    Fear of Current or Ex-Partner: No    Emotionally Abused: No    Physically Abused: No    Sexually Abused: No    FAMILY HISTORY: Family History  Problem Relation Age of Onset   Heart disease  Maternal Grandmother    Heart disease Maternal Grandfather    Heart disease Paternal Grandmother    Heart disease Paternal Grandfather    Cancer Cousin    Hypertension Mother    Colon cancer Maternal Uncle    Rectal cancer Neg Hx    Stomach cancer Neg Hx     ALLERGIES:  has no known allergies.  MEDICATIONS:  Current Outpatient Medications  Medication Sig Dispense Refill   amLODipine  (NORVASC ) 10 MG tablet Take 1 tablet (10 mg total) by mouth daily. 90 tablet 3   aspirin  81 MG chewable tablet Chew 1 tablet (81 mg total) by mouth daily. 90 tablet 3   atorvastatin  (LIPITOR) 40 MG tablet Take 1 tablet (40 mg total) by mouth daily. for cholesterol. 90 tablet 3   docusate (COLACE) 50 MG/5ML liquid Take 10 mLs (100 mg total) by mouth 2 (two) times daily. 100 mL 10   folic acid  (FOLVITE ) 1 MG tablet Take 1 tablet (1 mg total) by mouth daily. 90 tablet 3   gabapentin  (NEURONTIN ) 300 MG capsule Take 2 capsules (600 mg total) by mouth at bedtime. 180 capsule 3   hydrALAZINE  (APRESOLINE ) 25 MG tablet Take 3 tablets (75 mg total) by mouth 3 (three) times daily. 270 tablet 3   levETIRAcetam  (KEPPRA ) 500 MG tablet Take 1 tablet (500 mg total) by mouth 2 (two) times daily. 180 tablet 3   losartan  (COZAAR ) 25 MG tablet Take 1 tablet (25 mg total) by mouth daily. 90 tablet 3   nicotine  (NICODERM CQ  - DOSED IN MG/24 HOURS) 21 mg/24hr patch Place 1 patch (21 mg total) onto the skin daily. 28 patch 0   oxyCODONE -acetaminophen  (PERCOCET) 7.5-325 MG tablet Take 1 tablet by mouth every 4 (four) hours as needed for severe pain (pain score 7-10). Due to metastatic cancer to spine 60 tablet 0   pantoprazole  (PROTONIX ) 40 MG tablet Take 1 tablet (40 mg total) by mouth daily. 90 tablet 3   polyethylene glycol (MIRALAX  / GLYCOLAX ) 17 g packet Take 17 g by mouth daily as needed for moderate constipation. 14 each 0   thiamine  (VITAMIN B-1) 100 MG tablet Take 1 tablet (100 mg total) by mouth daily. 100 tablet 3    tiZANidine  (ZANAFLEX ) 4 MG tablet Take 1 tablet (4 mg total) by mouth every 6 (six) hours as needed for muscle spasms. 30 tablet 1   topiramate  (TOPAMAX ) 50 MG tablet Take 1 tablet (50 mg total) by mouth 2 (two) times daily. 180 tablet 0   Vitamin D , Ergocalciferol , (DRISDOL ) 1.25 MG (50000 UNIT) CAPS capsule Take 1 capsule (50,000 Units total) by mouth every 7 (seven)  days. 13 capsule 3   No current facility-administered medications for this visit.    REVIEW OF SYSTEMS:   Review of Systems  Constitutional:  Positive for malaise/fatigue.  Musculoskeletal:  Positive for back pain and joint pain.  Neurological:  Positive for weakness.     PHYSICAL EXAMINATION: ECOG PERFORMANCE STATUS: 2 - Symptomatic, <50% confined to bed  There were no vitals filed for this visit. There were no vitals filed for this visit. Physical Exam Constitutional:      Appearance: Normal appearance.  Cardiovascular:     Rate and Rhythm: Normal rate and regular rhythm.  Pulmonary:     Effort: Pulmonary effort is normal.     Breath sounds: Normal breath sounds.  Abdominal:     General: Bowel sounds are normal.     Palpations: Abdomen is soft.  Musculoskeletal:        General: No swelling. Normal range of motion.  Neurological:     Mental Status: He is alert and oriented to person, place, and time. Mental status is at baseline.     LABORATORY DATA:  I have reviewed the data as listed    Latest Ref Rng & Units 01/04/2024    1:45 PM 12/16/2023    5:42 AM 12/15/2023    9:10 AM  CBC  WBC 3.4 - 10.8 x10E3/uL 8.2  6.0  5.5   Hemoglobin 13.0 - 17.7 g/dL 84.4  83.5  82.6   Hematocrit 37.5 - 51.0 % 46.1  47.7  49.9   Platelets 150 - 450 x10E3/uL 213  142  159        Latest Ref Rng & Units 01/04/2024    1:45 PM 12/17/2023    8:48 AM 12/16/2023    5:42 AM  CMP  Glucose 70 - 99 mg/dL 897  813  896   BUN 8 - 27 mg/dL 16  14  12    Creatinine 0.76 - 1.27 mg/dL 7.88  8.66  8.76   Sodium 134 - 144 mmol/L 142   140  140   Potassium 3.5 - 5.2 mmol/L 4.6  3.6  3.4   Chloride 96 - 106 mmol/L 106  110  108   CO2 20 - 29 mmol/L 21  20  23    Calcium  8.6 - 10.2 mg/dL 89.4  9.4  9.4   Total Protein 6.0 - 8.5 g/dL 8.0     Total Bilirubin 0.0 - 1.2 mg/dL 0.4     Alkaline Phos 44 - 121 IU/L 234     AST 0 - 40 IU/L 28     ALT 0 - 44 IU/L 18        RADIOGRAPHIC STUDIES: I have personally reviewed the radiological images as listed and agreed with the findings in the report. MR LUMBAR SPINE WO CONTRAST Result Date: 03/16/2024 EXAM: MRI LUMBAR SPINE 03/14/2024 TECHNIQUE: Multiplanar multisequence MRI of the lumbar spine was performed without the administration of intravenous contrast. COMPARISON: Radiography 01/28/2024 and 05/15/2021 CLINICAL HISTORY: Lumbar radiculopathy with symptoms persisting for over 6 weeks despite treatment. The patient also reports low back pain. FINDINGS: BONES AND ALIGNMENT: 5 lumbar-type vertebral bodies. Bone marrow signal is abnormal, consistent with advanced and widespread osseous metastatic disease. Metastatic disease involves all vertebral body levels from T12 through S3 and both sides of the sacrum in the ala regions. Minor pathologic compression fracture of L1 with approximately 10% loss of height. Pathologic fracture of the posterior superior corner of the L5 vertebral body with approximately 10% loss  of height in that location. Metastatic disease also involved the iliac bones. Extensive lesion involving the right iliac bone with cystic and solid components measuring at least 7 x 12 cm, extending into the soft tissues of the gluteal muscles and the iliacus muscle. This is presumed representative of advanced tumor. SPINAL CORD: The conus terminates normally. SOFT TISSUES: Extensive lesion involving the right iliac bone with cystic and solid components measuring at least 7 x 12 mm, extending into the soft tissues of the gluteal muscles and the iliacus muscle. This is presumed  representative of an advanced tumor. T12-L1: Expansion of the pedicle on the left at T12 with early encroachment upon the left T11-12 foramen. NO SIGNIFICANT DISC SPACE FINDING FROM L1-2 THROUGH L3-4. L4-L5: Shallow protrusions of the disc centrally and towards the left with mild narrowing of the left lateral recess. L5-S1: Facet arthropathy with 2 mm of degenerative anterolisthesis and mild bilateral foraminal stenosis. There is a small amount of extraosseous tumor within the ventral epidural space adjacent to the L5 nerves. Some potential that this could have mass effect on the left L5 nerve. At S1, there is extraosseous extension or tumor into the ventral epidural space, which indents the ventral aspect of the thecal sac. There is some potential this could affect the S1 nerve on the left. VASCULATURE: Aortic atherosclerosis without aneurysm. RETROPERITONEUM: No retroperitoneal hemorrhage. No lymphadenopathy is seen. IMPRESSION: 1. Advanced and widespread osseous metastatic disease involving the lumbar spine, sacrum, and iliac bones, with associated pathologic fractures at L1 and L5, and extraosseous tumor extension at L5 and S1 with potential nerve involvement. 2. Extensive right iliac osseous lesion with cystic and solid components extending into adjacent soft tissues, presumed advanced tumor. Electronically signed by: Oneil Officer MD 03/16/2024 01:17 PM EDT RP Workstation: [id number]    ASSESSMENT & PLAN Assessment & Plan Bone lesion --Discussed case with Dr. 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 --Labs today include CBC, CMP, LDH, testosterone to confirm. --PSA greater than 300. --Recommend starting Firmagon ASAP.  Once PSA is less than 200, can initiate Eligard as well. --Patient will need PSMA-PET scan --RTC once workup is complete  Elevated PSA --PSA greater than 300. -- Repeat PSA today.  Will get PSMA PET scan. Chronic bilateral low back pain with left-sided sciatica --  Etiology likely secondary to metastatic disease. --Currently taking Percocet every 4 hours which is not helping --Discussed long-acting pain medicine such as MS Contin  15 mg every 12 hours.  -- New Rx sent to pharmacy.   Orders Placed This Encounter  Procedures   CBC with Differential    Standing Status:   Future    Expected Date:   03/22/2024    Expiration Date:   06/20/2024   Comprehensive metabolic panel    Standing Status:   Future    Expected Date:   03/22/2024    Expiration Date:   06/20/2024   Lactate dehydrogenase    Standing Status:   Future    Expected Date:   03/22/2024    Expiration Date:   06/20/2024   Multiple Myeloma Panel (SPEP&IFE w/QIG)    Standing Status:   Future    Expected Date:   03/22/2024    Expiration Date:   06/20/2024   Kappa/lambda light chains    Standing Status:   Future    Expected Date:   03/22/2024    Expiration Date:   06/20/2024   PSA    Standing Status:   Future  Expected Date:   03/22/2024    Expiration Date:   06/20/2024    All questions were answered. The patient knows to call the clinic with any problems, questions or concerns.  I have spent a total of 40 minutes minutes of face-to-face and non-face-to-face time, preparing to see the patient, obtaining and/or reviewing separately obtained history, performing a medically appropriate examination, counseling and educating the patient, ordering medications/tests/procedures, referring and communicating with other health care professionals, documenting clinical information in the electronic health record, independently interpreting results and communicating results to the patient, and care coordination.   Delon Hope, AGNP-C Department of Hematology/Oncology [facility] Phone: [phone]

## 2024-03-23 ENCOUNTER — Ambulatory Visit: Payer: Self-pay | Admitting: Oncology

## 2024-03-23 ENCOUNTER — Other Ambulatory Visit: Payer: Self-pay | Admitting: Family Medicine

## 2024-03-23 DIAGNOSIS — I10 Essential (primary) hypertension: Secondary | ICD-10-CM

## 2024-03-23 LAB — KAPPA/LAMBDA LIGHT CHAINS
Kappa free light chain: 43.1 mg/L — ABNORMAL HIGH (ref 3.3–19.4)
Kappa, lambda light chain ratio: 1.16 (ref 0.26–1.65)
Lambda free light chains: 37 mg/L — ABNORMAL HIGH (ref 5.7–26.3)

## 2024-03-23 LAB — TESTOSTERONE: Testosterone: 670 ng/dL (ref 264–916)

## 2024-03-23 MED ORDER — BLOOD PRESSURE MONITOR DEVI: 99 refills | Status: DC

## 2024-03-23 NOTE — Progress Notes (Signed)
 CLAUDIUS Delon Hope, NP 03/23/2024 12:35 PM]

## 2024-03-23 NOTE — Telephone Encounter (Signed)
 Scrip sent to [facility]

## 2024-03-23 NOTE — Telephone Encounter (Signed)
 Patient needs a  B/P home monitor. Asking if an order can be faxed to [facility].

## 2024-03-23 NOTE — Telephone Encounter (Signed)
Pt notified.    LS

## 2024-03-24 ENCOUNTER — Telehealth: Payer: Self-pay

## 2024-03-24 NOTE — Telephone Encounter (Signed)
 Copied from CRM [id number]. Topic: General - Other >> Mar 24, 2024 11:50 AM Travis F wrote: Reason for CRM: Patient's sister [name] is calling in because she was told by his cancer doctor to contact his doctor's office regarding him getting an in home aid to help with daily tasks.

## 2024-03-24 NOTE — Telephone Encounter (Signed)
 Informed sister that the pt would need to be seen in the office to discuss this. Appt made for 11/6 at 3:10 with Dr. Zollie. Sister did not want to wait until the 46m check up on 11/10. Advised that the appts will need to be kept separate. Sister understood. States that she will need to double check with other family members to see if they could help pt to the appt. Sister will call back to confirm appt.

## 2024-03-25 ENCOUNTER — Emergency Department (HOSPITAL_COMMUNITY)
Admission: EM | Admit: 2024-03-25 | Discharge: 2024-03-25 | Disposition: A | Discharge status: Home / Self Care | Source: Ambulatory Visit | Attending: Emergency Medicine | Admitting: Emergency Medicine

## 2024-03-25 ENCOUNTER — Encounter (HOSPITAL_COMMUNITY): Payer: Self-pay | Admitting: Emergency Medicine

## 2024-03-25 ENCOUNTER — Telehealth: Payer: Self-pay | Admitting: Oncology

## 2024-03-25 ENCOUNTER — Other Ambulatory Visit: Payer: Self-pay

## 2024-03-25 ENCOUNTER — Inpatient Hospital Stay

## 2024-03-25 VITALS — BP 91/68 | HR 98 | Temp 98.1°F | Resp 20

## 2024-03-25 DIAGNOSIS — R1030 Lower abdominal pain, unspecified: Secondary | ICD-10-CM | POA: Insufficient documentation

## 2024-03-25 DIAGNOSIS — M899 Disorder of bone, unspecified: Secondary | ICD-10-CM

## 2024-03-25 DIAGNOSIS — I10 Essential (primary) hypertension: Secondary | ICD-10-CM | POA: Diagnosis not present

## 2024-03-25 DIAGNOSIS — F17200 Nicotine dependence, unspecified, uncomplicated: Secondary | ICD-10-CM | POA: Insufficient documentation

## 2024-03-25 DIAGNOSIS — Z5111 Encounter for antineoplastic chemotherapy: Secondary | ICD-10-CM | POA: Diagnosis not present

## 2024-03-25 DIAGNOSIS — Z79899 Other long term (current) drug therapy: Secondary | ICD-10-CM | POA: Diagnosis not present

## 2024-03-25 DIAGNOSIS — R339 Retention of urine, unspecified: Secondary | ICD-10-CM | POA: Diagnosis present

## 2024-03-25 DIAGNOSIS — Z7982 Long term (current) use of aspirin: Secondary | ICD-10-CM | POA: Insufficient documentation

## 2024-03-25 DIAGNOSIS — K219 Gastro-esophageal reflux disease without esophagitis: Secondary | ICD-10-CM | POA: Diagnosis not present

## 2024-03-25 DIAGNOSIS — R972 Elevated prostate specific antigen [PSA]: Secondary | ICD-10-CM

## 2024-03-25 DIAGNOSIS — E119 Type 2 diabetes mellitus without complications: Secondary | ICD-10-CM | POA: Insufficient documentation

## 2024-03-25 DIAGNOSIS — R338 Other retention of urine: Secondary | ICD-10-CM

## 2024-03-25 LAB — URINALYSIS, ROUTINE W REFLEX MICROSCOPIC
Bacteria, UA: NONE SEEN
Bilirubin Urine: NEGATIVE
Glucose, UA: NEGATIVE mg/dL
Ketones, ur: 5 mg/dL — AB
Leukocytes,Ua: NEGATIVE
Nitrite: NEGATIVE
Protein, ur: 100 mg/dL — AB
RBC / HPF: >50 RBC/hpf (ref 0–5)
Specific Gravity, Urine: 1.023 (ref 1.005–1.030)
pH: 5 (ref 5.0–8.0)

## 2024-03-25 LAB — BASIC METABOLIC PANEL WITH GFR
Anion gap: 12 (ref 5–15)
BUN: 16 mg/dL (ref 8–23)
CO2: 28 mmol/L (ref 22–32)
Calcium: 10.6 mg/dL — ABNORMAL HIGH (ref 8.9–10.3)
Chloride: 103 mmol/L (ref 98–111)
Creatinine, Ser: 1.1 mg/dL (ref 0.61–1.24)
GFR, Estimated: >60 mL/min (ref >60)
Glucose, Bld: 83 mg/dL (ref 70–99)
Potassium: 3.6 mmol/L (ref 3.5–5.1)
Sodium: 144 mmol/L (ref 135–145)

## 2024-03-25 LAB — CBC
HCT: 38.7 % — ABNORMAL LOW (ref 39.0–52.0)
Hemoglobin: 13 g/dL (ref 13.0–17.0)
MCH: 30.8 pg (ref 26.0–34.0)
MCHC: 33.6 g/dL (ref 30.0–36.0)
MCV: 91.7 fL (ref 80.0–100.0)
Platelets: 166 K/uL (ref 150–400)
RBC: 4.22 MIL/uL (ref 4.22–5.81)
RDW: 14.8 % (ref 11.5–15.5)
WBC: 8.1 K/uL (ref 4.0–10.5)
nRBC: 0 % (ref 0.0–0.2)

## 2024-03-25 MED ORDER — DEGARELIX ACETATE(240 MG DOSE) 120 MG/VIAL ~~LOC~~ SOLR
240.0000 mg | Freq: Once | SUBCUTANEOUS | Status: AC
  Administered 2024-03-25: 240 mg via SUBCUTANEOUS
  Filled 2024-03-25: qty 6

## 2024-03-25 NOTE — Progress Notes (Signed)
 Patient will be transported to the ED for evaluation per Delon Hope, NP.  Patient has had no urinary output x 2 days and is having abdominal pain.  ED Charge Nurse made aware.

## 2024-03-25 NOTE — Progress Notes (Signed)
 Patient here in clinic today for his firmagon injections. Patient has prostate cancer. Patient stated he was not able to urinate last night at all. He complains of pain and a fullness feeling in he lower pelvis area, reports back and lower leg pain as well. Blood pressure runs low as well .Delon Hope NP notified, 2 sisters present with pt today. Will take pt to the ED for further evaluation per provider.   Firmagon injections  given per orders. Patient tolerated it well without problems. Patient transported to the ED per Delon Hope NP.

## 2024-03-25 NOTE — ED Triage Notes (Signed)
 Pt to the ED from the Cancer center where he was being worked up for prostate cancer.  Pt states he has not urinated in 2 days.

## 2024-03-25 NOTE — Telephone Encounter (Signed)
 Patient here for first Firmagon injection.  During injection, patient complained of the need to urinate but he was unable.  Reports he has not urinated in 2 days.  Patient is currently being worked up for probable prostate cancer but we have not completed his workup yet.  He was started on Firmagon given elevated PSA.  He does have follow-up with urology but it is not for 2 more weeks.  He will need to see urology sooner and possibly have coud catheter placed.  We do not have the availability to place coud in clinic.  Would recommend to ED.  Report called down to the emergency room.  Patient in agreement.  He has PSMA scheduled for next week and follow-up appointment with Dr. Davonna.   Delon Hope, NP 03/25/2024 11:18 AM\

## 2024-03-25 NOTE — ED Provider Notes (Signed)
 [place] [facility]MENT AT [facility] Provider Note   CSN: [id number] Arrival date & time: 03/25/24  1119     Patient presents with: Urinary Retention   Philip [name] is [name] 68 y.o. male.   HPI Patient sent in for decreased urination.  Reportedly not urinated in 2 days.  Being worked up for prostate cancer.  States he feels as if he has to go but cannot.  No fevers.  Does have lower abdominal pain.   Past Medical History:  Diagnosis Date   Chronic cholecystitis with calculus 02/04/2013   CVA (cerebral vascular accident) 09/16/2012   Diabetes mellitus without complication (HCC)    borderline   Erectile dysfunction    Gallstones 01/27/2013   GERD (gastroesophageal reflux disease)    H. pylori infection 09/28/2017   Headache(784.0)    Headache, I dont think they are migranes   Hepatitis C    Harvoni , completed 06/2015 (Dr. Efrain) , AUG 2017 SVR   History of kidney stones    history of kidney stones   History of migraine    takes Topamax  daily   Hypertension    takes Hydralazine ,Maxzide ,Clonidine ,and Amlodipine  daily   Hypertension    also takes Metoprolol  daily   Nausea    takes Zofran  daily   Seizures (HCC)    off Dilantin  daily.  last one was when he had [name] stroke.   Short-term memory loss    due to stroke    Sleep apnea    CPAP- tries to do it everyday.   Stroke (HCC)    .  Short term memory loss.; left sided weakness   Tobacco user    Umbilical hernia 02/04/2013    Prior to Admission medications   Medication Sig Start Date End Date Taking? Authorizing Provider  amLODipine  (NORVASC ) 10 MG tablet Take 1 tablet (10 mg total) by mouth daily. 12/31/23 03/16/24  Philip Lowers, MD  aspirin  81 MG chewable tablet Chew 1 tablet (81 mg total) by mouth daily. 12/31/23   Philip Lowers, MD  atorvastatin  (LIPITOR) 40 MG tablet Take 1 tablet (40 mg total) by mouth daily. for cholesterol. 12/31/23   Philip Lowers, MD  Blood Pressure Monitor DEVI Use to check BP daily  at rest 03/23/24   Philip Lowers, MD  docusate (COLACE) 50 MG/5ML liquid Take 10 mLs (100 mg total) by mouth 2 (two) times daily. 12/31/23   Philip Lowers, MD  folic acid  (FOLVITE ) 1 MG tablet Take 1 tablet (1 mg total) by mouth daily. 12/31/23   Philip Lowers, MD  gabapentin  (NEURONTIN ) 300 MG capsule Take 2 capsules (600 mg total) by mouth at bedtime. 12/31/23   Philip Lowers, MD  hydrALAZINE  (APRESOLINE ) 25 MG tablet Take 3 tablets (75 mg total) by mouth 3 (three) times daily. 01/08/24 03/16/24  Philip Lowers, MD  levETIRAcetam  (KEPPRA ) 500 MG tablet Take 1 tablet (500 mg total) by mouth 2 (two) times daily. 12/31/23   Philip Lowers, MD  losartan  (COZAAR ) 25 MG tablet Take 1 tablet (25 mg total) by mouth daily. 12/31/23   Philip Lowers, MD  nicotine  (NICODERM CQ  - DOSED IN MG/24 HOURS) 21 mg/24hr patch Place 1 patch (21 mg total) onto the skin daily. 12/17/23   Regalado, Belkys A, MD  oxyCODONE -acetaminophen  (PERCOCET) 7.5-325 MG tablet Take 1 tablet by mouth every 4 (four) hours as needed for severe pain (pain score 7-10). Due to metastatic cancer to spine 03/16/24   Philip Lowers, MD  pantoprazole  (PROTONIX ) 40 MG tablet  Take 1 tablet (40 mg total) by mouth daily. 12/31/23   Philip Lowers, MD  polyethylene glycol (MIRALAX  / GLYCOLAX ) 17 g packet Take 17 g by mouth daily as needed for moderate constipation. 12/16/23   Regalado, Belkys A, MD  thiamine  (VITAMIN B-1) 100 MG tablet Take 1 tablet (100 mg total) by mouth daily. 12/31/23   Philip Lowers, MD  tiZANidine  (ZANAFLEX ) 4 MG tablet Take 1 tablet (4 mg total) by mouth every 6 (six) hours as needed for muscle spasms. 01/28/24   Philip Lowers, MD  topiramate  (TOPAMAX ) 50 MG tablet Take 1 tablet (50 mg total) by mouth 2 (two) times daily. 12/31/23   Philip Lowers, MD  Vitamin D , Ergocalciferol , (DRISDOL ) 1.25 MG (50000 UNIT) CAPS capsule Take 1 capsule (50,000 Units total) by mouth every 7 (seven) days. 01/11/24   Philip Lowers, MD    Allergies: Patient  has no known allergies.    Review of Systems  Updated Vital Signs BP 126/86 (BP Location: Right Wrist)   Pulse 80   Temp 98.5 F (36.9 C) (Oral)   Resp 16   Ht 5' 7 (1.702 m)   Wt 56.7 kg   SpO2 98%   BMI 19.58 kg/m   Physical Exam Vitals and nursing note reviewed.  Abdominal:     Tenderness: There is abdominal tenderness.     Comments: Suprapubic tenderness without large mass.     (all labs ordered are listed, but only abnormal results are displayed) Labs Reviewed  BASIC METABOLIC PANEL WITH GFR - Abnormal; Notable for the following components:      Result Value   Calcium  10.6 (*)    All other components within normal limits  CBC - Abnormal; Notable for the following components:   HCT 38.7 (*)    All other components within normal limits  URINALYSIS, ROUTINE W REFLEX MICROSCOPIC - Abnormal; Notable for the following components:   Color, Urine AMBER (*)    APPearance HAZY (*)    Hgb urine dipstick LARGE (*)    Ketones, ur 5 (*)    Protein, ur 100 (*)    All other components within normal limits    EKG: None  Radiology: No results found.   Procedures   Medications Ordered in the ED - No data to display                                  Medical Decision Making Amount and/or Complexity of Data Reviewed Labs: ordered.    patient decreased urination.  Last urinated 2 days ago.  Bedside bladder scan however only showed greater than 500.  Catheter placed and did have some urine.  Good kidney function.  Urinalysis still pending.  Urinalysis does not show infection.  Catheter been placed.  Feeling better.  I think likely due to prostatic hypertrophy potentially from his malignancy.  Follow-up with urology.      Final diagnoses:  Acute urinary retention    ED Discharge Orders     None          Philip Lot, MD 03/25/24 1453

## 2024-03-27 ENCOUNTER — Encounter: Payer: Self-pay | Admitting: Family Medicine

## 2024-03-28 ENCOUNTER — Encounter: Payer: Self-pay | Admitting: Oncology

## 2024-03-28 LAB — MULTIPLE MYELOMA PANEL, SERUM
Albumin SerPl Elph-Mcnc: 3.4 g/dL (ref 2.9–4.4)
Albumin/Glob SerPl: 0.9 (ref 0.7–1.7)
Alpha 1: 0.5 g/dL — ABNORMAL HIGH (ref 0.0–0.4)
Alpha2 Glob SerPl Elph-Mcnc: 1 g/dL (ref 0.4–1.0)
B-Globulin SerPl Elph-Mcnc: 1.6 g/dL — ABNORMAL HIGH (ref 0.7–1.3)
Gamma Glob SerPl Elph-Mcnc: 0.9 g/dL (ref 0.4–1.8)
Globulin, Total: 4.1 g/dL — ABNORMAL HIGH (ref 2.2–3.9)
IgA: 437 mg/dL (ref 61–437)
IgG (Immunoglobin G), Serum: 1044 mg/dL (ref 603–1613)
IgM (Immunoglobulin M), Srm: 53 mg/dL (ref 20–172)
Total Protein ELP: 7.5 g/dL (ref 6.0–8.5)

## 2024-03-29 NOTE — Progress Notes (Signed)
 This patient got his first dose of degarelix on Friday.  He was complaining of suprapubic pain and had not peed in over 2 days.  We sent him to the ED and they cathed him.  He does have follow-up with urology in 10 days or so and I believe he was sent home with a catheter.  Just FYI.  He sees you next week.

## 2024-03-31 ENCOUNTER — Ambulatory Visit (HOSPITAL_COMMUNITY)
Admission: RE | Admit: 2024-03-31 | Discharge: 2024-03-31 | Disposition: A | Discharge status: Home / Self Care | Source: Ambulatory Visit | Attending: Oncology | Admitting: Oncology

## 2024-03-31 ENCOUNTER — Ambulatory Visit: Admitting: Family Medicine

## 2024-03-31 ENCOUNTER — Encounter: Payer: Self-pay | Admitting: Emergency Medicine

## 2024-03-31 ENCOUNTER — Encounter (HOSPITAL_COMMUNITY): Payer: Self-pay

## 2024-03-31 DIAGNOSIS — R972 Elevated prostate specific antigen [PSA]: Secondary | ICD-10-CM | POA: Insufficient documentation

## 2024-03-31 DIAGNOSIS — M899 Disorder of bone, unspecified: Secondary | ICD-10-CM | POA: Insufficient documentation

## 2024-04-04 ENCOUNTER — Encounter: Payer: Self-pay | Admitting: Oncology

## 2024-04-04 ENCOUNTER — Encounter: Payer: Self-pay | Admitting: Family Medicine

## 2024-04-04 ENCOUNTER — Ambulatory Visit (INDEPENDENT_AMBULATORY_CARE_PROVIDER_SITE_OTHER): Payer: Self-pay | Admitting: Family Medicine

## 2024-04-04 VITALS — BP 126/83 | HR 89 | Temp 98.1°F | Ht 67.0 in | Wt 123.0 lb

## 2024-04-04 DIAGNOSIS — C7951 Secondary malignant neoplasm of bone: Secondary | ICD-10-CM | POA: Diagnosis not present

## 2024-04-04 DIAGNOSIS — C61 Malignant neoplasm of prostate: Secondary | ICD-10-CM

## 2024-04-04 DIAGNOSIS — R519 Headache, unspecified: Secondary | ICD-10-CM | POA: Diagnosis not present

## 2024-04-04 DIAGNOSIS — G8929 Other chronic pain: Secondary | ICD-10-CM | POA: Diagnosis not present

## 2024-04-04 MED ORDER — TOPIRAMATE 50 MG PO TABS: 50.0000 mg | ORAL_TABLET | Freq: Every day | ORAL | 0 refills | Status: DC

## 2024-04-04 NOTE — Progress Notes (Signed)
 Subjective:  Patient ID: Philip [name], male    DOB: 1955-07-18  Age: 68 y.o. MRN: [id number]  CC: Medication Refill ( Pended) and Back Pain (Ongoing lower back pain.)   HPI  Discussed the use of AI scribe software for clinical note transcription with the patient, who gave verbal consent to proceed.  History of Present Illness Philip [name] is a 68 year old male with prostate cancer with bone metastasis who presents with back pain.  He experiences significant back pain, which impairs his ability to walk. He uses a wheelchair provided by the facility but manages with a walker with a seat at home. Pt. Declines refill of pain med as it isn't hurting very bad. He has a PET scheduled for 3 days from now. Could not be done at original time last week because no blood could be drawn.   He takes strong pain medication, which is effective but causes drowsiness. He requested refills for tizanidine , a muscle relaxer, but it contributes to drowsiness.          03/25/2024   10:15 AM 03/22/2024    8:37 AM 03/02/2024   10:04 AM  Depression screen PHQ 2/9  Decreased Interest 0 0 2  Down, Depressed, Hopeless 0 0 2  PHQ - 2 Score 0 0 4  Altered sleeping 0 0 2  Tired, decreased energy 1 0 3  Change in appetite 1 0 3  Feeling bad or failure about yourself  0 0 2  Trouble concentrating 0 0 2  Moving slowly or fidgety/restless 0 0 2  Suicidal thoughts 0 0 0  PHQ-9 Score 2  0  18   Difficult doing work/chores   Very difficult     Data saved with a previous flowsheet row definition    History Philip [name] has a past medical history of Chronic cholecystitis with calculus (02/04/2013), CVA (cerebral vascular accident) (09/16/2012), Diabetes mellitus without complication (HCC), Erectile dysfunction, Gallstones (01/27/2013), GERD (gastroesophageal reflux disease), H. pylori infection (09/28/2017), Headache(784.0), Hepatitis C, History of kidney stones, History of migraine, Hypertension, Hypertension, Nausea,  Seizures (HCC), Short-term memory loss, Sleep apnea, Stroke (HCC), Tobacco user, and Umbilical hernia (02/04/2013).   He has a past surgical history that includes Umbilical hernia repair (N/A, 03/09/2013); Colonoscopy (06/2014); Esophagogastroduodenoscopy (egd) with propofol  (N/A, 08/21/2015); Cholecystectomy (N/A, 03/09/2013); Esophagogastroduodenoscopy (egd) with propofol  (N/A, 03/10/2017); and biopsy (03/10/2017).   His family history includes Cancer in his cousin; Colon cancer in his maternal uncle; Heart disease in his maternal grandfather, maternal grandmother, paternal grandfather, and paternal grandmother; Hypertension in his mother.He reports that he has been smoking cigarettes. He started smoking about 45 years ago. He has a 9.2 pack-year smoking history. He has never used smokeless tobacco. He reports current alcohol use of about 7.0 standard drinks of alcohol per week. He reports that he does not use drugs.    ROS Review of Systems  Constitutional:  Negative for fever.  Respiratory:  Negative for shortness of breath.   Cardiovascular:  Negative for chest pain.  Musculoskeletal:  Positive for back pain. Negative for arthralgias.  Skin:  Negative for rash.    Objective:  BP 126/83   Pulse 89   Temp 98.1 F (36.7 C)   Ht 5' 7 (1.702 m)   Wt 123 lb (55.8 kg)   SpO2 98%   BMI 19.26 kg/m   BP Readings from Last 3 Encounters:  04/04/24 126/83  03/25/24 126/86  03/25/24 91/68    Wt Readings from Last  3 Encounters:  04/04/24 123 lb (55.8 kg)  03/25/24 125 lb (56.7 kg)  03/22/24 125 lb (56.7 kg)     Physical Exam Physical Exam GENERAL: Alert, cooperative, well developed, no acute distress HEENT: Normocephalic, normal oropharynx, moist mucous membranes CHEST: Clear to auscultation bilaterally, No wheezes, rhonchi, or crackles CARDIOVASCULAR: Normal heart rate and rhythm, S1 and S2 normal without murmurs ABDOMEN: Soft, non-tender, non-distended, without organomegaly,  Normal bowel sounds EXTREMITIES: No cyanosis or edema NEUROLOGICAL: Cranial nerves grossly intact, Moves all extremities without gross motor or sensory deficit   Assessment & Plan:  Prostate cancer metastatic to bone (HCC)  Chronic nonintractable headache, unspecified headache type    Assessment and Plan Assessment & Plan 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 Oxycodone -acetaminophen  effectively manages pain but causes drowsiness. Tizanidine  was discontinued due to its sedative effects and limited efficacy. Continue oxycodone -acetaminophen  for pain management.  PET pending. Pt. Very likely to need further opiate level meds for this, but will have to return to office. He is here with two sisters who inquire about prognosis. Unfortunately, without PET I told them no one could be sure, but without treatment one year would be generous based on extensive mets.   Due to concern about drowsiness, tizanidine  is Dced and topiramate  dose reduced.   Follow-up: Return in about 1 month (around 05/04/2024), or if symptoms worsen or fail to improve.  Butler Der, M.D.

## 2024-04-06 ENCOUNTER — Ambulatory Visit (INDEPENDENT_AMBULATORY_CARE_PROVIDER_SITE_OTHER): Admitting: Urology

## 2024-04-06 ENCOUNTER — Ambulatory Visit: Admitting: Family Medicine

## 2024-04-06 ENCOUNTER — Other Ambulatory Visit: Payer: Self-pay | Admitting: *Deleted

## 2024-04-06 VITALS — BP 104/73 | HR 97

## 2024-04-06 DIAGNOSIS — N529 Male erectile dysfunction, unspecified: Secondary | ICD-10-CM

## 2024-04-06 DIAGNOSIS — C61 Malignant neoplasm of prostate: Secondary | ICD-10-CM

## 2024-04-06 DIAGNOSIS — R972 Elevated prostate specific antigen [PSA]: Secondary | ICD-10-CM

## 2024-04-06 MED ORDER — LEUPROLIDE ACETATE (6 MONTH) 45 MG ~~LOC~~ KIT
45.0000 mg | PACK | SUBCUTANEOUS | Status: AC
  Administered 2024-05-06: 45 mg via SUBCUTANEOUS

## 2024-04-06 NOTE — Progress Notes (Signed)
 04/06/2024 3:08 PM   Philip [name] [birth year] [id number]  Referring provider: Zollie Lowers, MD [address] [place],  [place] [address]  Elevated PSA  HPI: Philip [name] is a 68yo here for evaluation of elevated PSa and urinary retention. PSA was 911 on 10/28 and he was evaluated at [facility]. He is scheduled for PSMA PET scan. PSA was 2.5 four year ago. He developed back pain 1 month ago. He developed urinary retention 11 days ago and had a foley catheter placed. He was given firmagon 2 weeks    PMH: Past Medical History:  Diagnosis Date   Chronic cholecystitis with calculus 02/04/2013   CVA (cerebral vascular accident) 09/16/2012   Diabetes mellitus without complication (HCC)    borderline   Erectile dysfunction    Gallstones 01/27/2013   GERD (gastroesophageal reflux disease)    H. pylori infection 09/28/2017   Headache(784.0)    Headache, I dont think they are migranes   Hepatitis C    Harvoni , completed 06/2015 (Dr. Efrain) , AUG 2017 SVR   History of kidney stones    history of kidney stones   History of migraine    takes Topamax  daily   Hypertension    takes Hydralazine ,Maxzide ,Clonidine ,and Amlodipine  daily   Hypertension    also takes Metoprolol  daily   Nausea    takes Zofran  daily   Seizures (HCC)    off Dilantin  daily.  last one was when he had a stroke.   Short-term memory loss    due to stroke    Sleep apnea    CPAP- tries to do it everyday.   Stroke (HCC)    .  Short term memory loss.; left sided weakness   Tobacco user    Umbilical hernia 02/04/2013    Surgical History: Past Surgical History:  Procedure Laterality Date   BIOPSY  03/10/2017   Procedure: BIOPSY;  Surgeon: Harvey Margo CROME, MD;  Location: [facility];  Service: Endoscopy;;  gastric biopsy    CHOLECYSTECTOMY N/A 03/09/2013   Procedure: LAPAROSCOPIC CHOLECYSTECTOMY WITH INTRAOPERATIVE CHOLANGIOGRAM;  Surgeon: Donnice POUR. Belinda, MD;  Location: [facility] OR;  Service: General;  Laterality:  N/A;   COLONOSCOPY  06/2014   Dr. Obie: Three sessile polyps were found in the rectum x2 and descending. tubular adenomas. colonoscopy in 06/2019   ESOPHAGOGASTRODUODENOSCOPY (EGD) WITH PROPOFOL  N/A 08/21/2015   Dr. Harvey: normal esophagus, non-bleeding gastric ulcers/gastritis (H.pylori)    ESOPHAGOGASTRODUODENOSCOPY (EGD) WITH PROPOFOL  N/A 03/10/2017   Procedure: ESOPHAGOGASTRODUODENOSCOPY (EGD) WITH PROPOFOL ;  Surgeon: Harvey Margo CROME, MD;  Location: [facility];  Service: Endoscopy;  Laterality: N/A;  1:00pm-rescheduled to 10\16 @ 8:15am    UMBILICAL HERNIA REPAIR N/A 03/09/2013   Procedure: HERNIA REPAIR UMBILICAL ADULT;  Surgeon: Donnice POUR. Belinda, MD;  Location: [facility] OR;  Service: General;  Laterality: N/A;    Home Medications:  Allergies as of 04/06/2024   No Known Allergies      Medication List        Accurate as of April 06, 2024  3:08 PM. If you have any questions, ask your nurse or doctor.          amLODipine  10 MG tablet Commonly known as: NORVASC  Take 1 tablet (10 mg total) by mouth daily.   aspirin  81 MG chewable tablet Chew 1 tablet (81 mg total) by mouth daily.   atorvastatin  40 MG tablet Commonly known as: LIPITOR Take 1 tablet (40 mg total) by mouth daily. for cholesterol.  Blood Pressure Monitor Devi Use to check BP daily at rest   docusate 50 MG/5ML liquid Commonly known as: COLACE Take 10 mLs (100 mg total) by mouth 2 (two) times daily.   folic acid  1 MG tablet Commonly known as: FOLVITE  Take 1 tablet (1 mg total) by mouth daily.   gabapentin  300 MG capsule Commonly known as: NEURONTIN  Take 2 capsules (600 mg total) by mouth at bedtime.   hydrALAZINE  25 MG tablet Commonly known as: APRESOLINE  Take 3 tablets (75 mg total) by mouth 3 (three) times daily.   levETIRAcetam  500 MG tablet Commonly known as: KEPPRA  Take 1 tablet (500 mg total) by mouth 2 (two) times daily.   losartan  25 MG tablet Commonly known as: COZAAR  Take 1 tablet  (25 mg total) by mouth daily.   nicotine  21 mg/24hr patch Commonly known as: NICODERM CQ  - dosed in mg/24 hours Place 1 patch (21 mg total) onto the skin daily.   oxyCODONE -acetaminophen  7.5-325 MG tablet Commonly known as: PERCOCET Take 1 tablet by mouth every 4 (four) hours as needed for severe pain (pain score 7-10). Due to metastatic cancer to spine   pantoprazole  40 MG tablet Commonly known as: PROTONIX  Take 1 tablet (40 mg total) by mouth daily.   polyethylene glycol 17 g packet Commonly known as: MIRALAX  / GLYCOLAX  Take 17 g by mouth daily as needed for moderate constipation.   thiamine  100 MG tablet Commonly known as: Vitamin B-1 Take 1 tablet (100 mg total) by mouth daily.   topiramate  50 MG tablet Commonly known as: TOPAMAX  Take 1 tablet (50 mg total) by mouth at bedtime.   Vitamin D  (Ergocalciferol ) 1.25 MG (50000 UNIT) Caps capsule Commonly known as: DRISDOL  Take 1 capsule (50,000 Units total) by mouth every 7 (seven) days.        Allergies: No Known Allergies  Family History: Family History  Problem Relation Age of Onset   Heart disease Maternal Grandmother    Heart disease Maternal Grandfather    Heart disease Paternal Grandmother    Heart disease Paternal Grandfather    Cancer Cousin    Hypertension Mother    Colon cancer Maternal Uncle    Rectal cancer Neg Hx    Stomach cancer Neg Hx     Social History:  reports that he has been smoking cigarettes. He started smoking about 45 years ago. He has a 9.2 pack-year smoking history. He has never used smokeless tobacco. He reports current alcohol use of about 7.0 standard drinks of alcohol per week. He reports that he does not use drugs.  ROS: All other review of systems were reviewed and are negative except what is noted above in HPI  Physical Exam: BP 104/73   Pulse 97   Constitutional:  Alert and oriented, No acute distress. HEENT: [place] AT, moist mucus membranes.  Trachea midline, no  masses. Cardiovascular: No clubbing, cyanosis, or edema. Respiratory: Normal respiratory effort, no increased work of breathing. GI: Abdomen is soft, nontender, nondistended, no abdominal masses GU: No CVA tenderness.  Lymph: No cervical or inguinal lymphadenopathy. Skin: No rashes, bruises or suspicious lesions. Neurologic: Grossly intact, no focal deficits, moving all 4 extremities. Psychiatric: Normal mood and affect.  Laboratory Data: Lab Results  Component Value Date   WBC 8.1 03/25/2024   HGB 13.0 03/25/2024   HCT 38.7 (L) 03/25/2024   MCV 91.7 03/25/2024   PLT 166 03/25/2024    Lab Results  Component Value Date   CREATININE 1.10 03/25/2024  No results found for: PSA  Lab Results  Component Value Date   TESTOSTERONE 670 03/22/2024    Lab Results  Component Value Date   HGBA1C 5.1 12/10/2023    Urinalysis    Component Value Date/Time   COLORURINE AMBER (A) 03/25/2024 1222   APPEARANCEUR HAZY (A) 03/25/2024 1222   APPEARANCEUR Clear 01/04/2024 1420   LABSPEC 1.023 03/25/2024 1222   PHURINE 5.0 03/25/2024 1222   GLUCOSEU NEGATIVE 03/25/2024 1222   HGBUR LARGE (A) 03/25/2024 1222   BILIRUBINUR NEGATIVE 03/25/2024 1222   BILIRUBINUR Negative 01/04/2024 1420   KETONESUR 5 (A) 03/25/2024 1222   PROTEINUR 100 (A) 03/25/2024 1222   UROBILINOGEN 1.0 06/06/2014 1737   NITRITE NEGATIVE 03/25/2024 1222   LEUKOCYTESUR NEGATIVE 03/25/2024 1222    Lab Results  Component Value Date   LABMICR 42.3 07/04/2015   MUCUS mod 01/12/2014   BACTERIA NONE SEEN 03/25/2024    Pertinent Imaging:  Results for orders placed in visit on 01/12/14  DG Abd 1 View  Narrative CLINICAL DATA:  Lower abdominal pain, history hypertension, smoking  EXAM: ABDOMEN - 1 VIEW  COMPARISON:  None  FINDINGS: Nonspecific bowel gas pattern.  No bowel dilatation or bowel wall thickening.  Nonspecific air-filled upper normal caliber small bowel loop noted in LEFT upper  quadrant.  RIGHT upper quadrant surgical clips likely cholecystectomy.  No urinary tract calcification or acute osseous findings.  IMPRESSION: No acute abnormalities.   Electronically Signed By: Oneil Kiss M.D. On: 01/13/2014 08:22  No results found for this or any previous visit.  No results found for this or any previous visit.  No results found for this or any previous visit.  Results for orders placed during the hospital encounter of 06/06/14  US  Renal  Narrative CLINICAL DATA:  Acute renal failure  EXAM: RENAL/URINARY TRACT ULTRASOUND COMPLETE  COMPARISON:  CT scan 01/13/2014  FINDINGS: Right Kidney:  Length: 10.4 cm. Echogenicity within normal limits. No mass or hydronephrosis visualized.  Left Kidney:  Length: 9.8 cm. Echogenicity within normal limits. No mass or hydronephrosis visualized.  Bladder:  Appears normal for degree of bladder distention. Bilateral ureteral jets are visualized.  IMPRESSION: Unremarkable renal ultrasound.  No hydronephrosis or renal calculi.   Electronically Signed By: Anita Foster M.D. On: 06/07/2014 15:33  No results found for this or any previous visit.  No results found for this or any previous visit.  No results found for this or any previous visit.   Assessment & Plan:    1.Prostate Cancer PSA and testosterone in 2 weeks Followup 2 weeks for eligard 45mg    No follow-ups on file.  Belvie Clara, MD  [facility] [place]

## 2024-04-07 ENCOUNTER — Inpatient Hospital Stay: Admitting: Oncology

## 2024-04-07 ENCOUNTER — Encounter: Payer: Self-pay | Admitting: Urology

## 2024-04-07 ENCOUNTER — Encounter (HOSPITAL_COMMUNITY)
Admission: RE | Admit: 2024-04-07 | Discharge: 2024-04-07 | Disposition: A | Discharge status: Home / Self Care | Source: Ambulatory Visit | Attending: Oncology | Admitting: Oncology

## 2024-04-07 DIAGNOSIS — R972 Elevated prostate specific antigen [PSA]: Secondary | ICD-10-CM | POA: Diagnosis present

## 2024-04-07 DIAGNOSIS — M899 Disorder of bone, unspecified: Secondary | ICD-10-CM | POA: Insufficient documentation

## 2024-04-07 MED ORDER — FLOTUFOLASTAT F 18 GALLIUM 296-5846 MBQ/ML IV SOLN
8.3300 | Freq: Once | INTRAVENOUS | Status: AC
  Administered 2024-04-07: 8.33 via INTRAVENOUS
  Filled 2024-04-07: qty 9

## 2024-04-07 NOTE — Patient Instructions (Signed)
Prostate Cancer  The prostate is a small gland that produces fluid that makes up semen (seminal fluid). It is located below the bladder in men, in front of the rectum. Prostate cancer is the abnormal growth of cells in the prostate gland. What are the causes? The exact cause of this condition is not known. What increases the risk? You are more likely to develop this condition if: You are 68 years of age or older. You have a family history of prostate cancer. You have a family history of breast and ovarian cancer. You have genes that are passed from parent to child (inherited), such as BRCA1 and BRCA2. You have Lynch syndrome. African American men and men of African descent are diagnosed with prostate cancer at higher rates than other men. The reasons for this are not well understood and are likely due to a combination of genetic and environmental factors. What are the signs or symptoms? Symptoms of this condition include: Problems with urination. This may include: A weak or interrupted flow of urine. Trouble starting or stopping urination. Trouble emptying the bladder all the way. The need to urinate more often, especially at night. Blood in urine or semen. Persistent pain or discomfort in the lower back, lower abdomen, or hips. Trouble getting an erection. Weakness or numbness in the legs or feet. How is this diagnosed? This condition can be diagnosed with: A digital rectal exam. For this exam, a health care provider inserts a gloved finger into the rectum to feel the prostate gland. A blood test called a prostate-specific antigen (PSA) test. A procedure in which a sample of tissue is taken from the prostate and checked under a microscope (prostate biopsy). An imaging test called transrectal ultrasonography. Once the condition is diagnosed, tests will be done to determine how far the cancer has spread. This is called staging the cancer. Staging may involve imaging tests, such as a bone  scan, CT scan, PET scan, or MRI. Stages of prostate cancer The stages of prostate cancer are as follows: Stage 1 (I). At this stage, the cancer is found in the prostate only. The cancer is not visible on imaging tests, and it is usually found by accident, such as during prostate surgery. Stage 2 (II). At this stage, the cancer is more advanced than it is in stage 1, but the cancer has not spread outside the prostate. Stage 3 (III). At this stage, the cancer has spread beyond the outer layer of the prostate to nearby tissues. The cancer may be found in the seminal vesicles, which are near the bladder and the prostate. Stage 4 (IV). At this stage, the cancer has spread to other parts of the body, such as the lymph nodes, bones, bladder, rectum, liver, or lungs. Prostate cancer grading Prostate cancer is also graded according to how the cancer cells look under a microscope. This is called the Gleason score and the total score can range from 6-10, indicating how likely it is that the cancer will spread (metastasize) to other parts of the body. The higher the score, the greater the likelihood that the cancer will spread. Gleason 6 or lower: This indicates that the cancer cells look similar to normal prostate cells (well differentiated). Gleason 7: This indicates that the cancer cells look somewhat similar to normal prostate cells (moderately differentiated). Gleason 8, 9, or 10: This indicates that the cancer cells look very different than normal prostate cells (poorly differentiated). How is this treated? Treatment for this condition depends on several  factors, including the stage of the cancer, your age, personal preferences, and your overall health. Talk with your health care provider about treatment options that are recommended for you. Common treatments include: Observation for early stage prostate cancer (active surveillance). This involves having exams, blood tests, and in some cases, more biopsies.  For some men, this is the only treatment needed. Surgery. Types of surgeries include: Open surgery (radical prostatectomy). In this surgery, a larger incision is made to remove the prostate. A laparoscopic radical prostatectomy. This is a surgery to remove the prostate and lymph nodes through several small incisions. It is often referred to as a minimally invasive surgery. A robotic radical prostatectomy. This is laparoscopic surgery to remove the prostate and lymph nodes with the help of robotic arms that are controlled by the surgeon. Cryoablation. This is surgery to freeze and destroy cancer cells. Radiation treatment. Types of radiation treatment include: External beam radiation. This type aims beams of radiation from outside the body at the prostate to destroy cancerous cells. Brachytherapy. This type uses radioactive needles, seeds, wires, or tubes that are implanted into the prostate gland. Like external beam radiation, brachytherapy destroys cancerous cells. An advantage is that this type of radiation limits the damage to surrounding tissue and has fewer side effects. Chemotherapy. This treatment kills cancer cells or stops them from multiplying. It kills both cancer cells and normal cells. Targeted therapy. This treatment uses medicines to kill cancer cells without damaging normal cells. Hormone treatment. This treatment involves taking medicines that act on testosterone, one of the male hormones, by: Stopping your body from producing testosterone. Blocking testosterone from reaching cancer cells. Follow these instructions at home: Lifestyle Do not use any products that contain nicotine or tobacco. These products include cigarettes, chewing tobacco, and vaping devices, such as e-cigarettes. If you need help quitting, ask your health care provider. Eat a healthy diet. To do this: Eat foods that are high in fiber. These include beans, whole grains, and fresh fruits and vegetables. Limit  foods that are high in fat and sugar. These include fried or sweet foods. Treatment for prostate cancer may affect sexual function. If you have a partner, continue to have intimate moments. This may include touching, holding, hugging, and caressing your partner. Get plenty of sleep. Consider joining a support group for men who have prostate cancer. Meeting with a support group may help you learn to manage the stress of having cancer. General instructions Take over-the-counter and prescription medicines only as told by your health care provider. If you have to go to the hospital, notify your cancer specialist (oncologist). Keep all follow-up visits. This is important. Where to find more information American Cancer Society: www.cancer.[place] [employer]: www.cancer.net [employer]: www.cancer.gov Contact a health care provider if: You have new or increasing trouble urinating. You have new or increasing blood in your urine. You have new or increasing pain in your hips, back, or chest. Get help right away if: You have weakness or numbness in your legs. You cannot control urination or your bowel movements (incontinence). You have chills or a fever. Summary The prostate is a small gland that is involved in the production of semen. It is located below a man's bladder, in front of the rectum. Prostate cancer is the abnormal growth of cells in the prostate gland. Treatment for this condition depends on the stage of the cancer, your age, personal preferences, and your overall health. Talk with your health care provider about  treatment options that are recommended for you. Consider joining a support group for men who have prostate cancer. Meeting with a support group may help you learn to manage the stress of having cancer. This information is not intended to replace advice given to you by your health care provider. Make sure you discuss any questions you have with  your health care provider. Document Revised: 08/08/2020 Document Reviewed: 08/08/2020 Elsevier Patient Education  2024 [employer].

## 2024-04-13 ENCOUNTER — Ambulatory Visit: Admitting: Family Medicine

## 2024-04-17 NOTE — Progress Notes (Unsigned)
 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 Summary of oncologic history is as follows:  Diagnosis: Left-sided castrate sensitive prostate cancer  Presentation: Significant back pain -03/14/2024: MRI lumbar spine: Advanced and widespread osseous metastatic disease involving the lumbar spine, sacrum, and iliac bones, with associated pathologic fractures at L1 and L5, and extraosseous tumor extension at L5 and S1 with potential nerve involvement. Extensive right iliac osseous lesion with cystic and solid components extending into adjacent soft tissues, presumed advanced tumor. -03/22/2024: PSA: 911.34 -03/25/2024:Started on degarelix  240mg   -04/07/2024: Intense radiotracer activity in the posterior left prostate, SUV max 11.0, compatible with primary prostate malignancy. Intensely PSMA-avid left obturator nodal metastasis measuring 2 cm short axis, SUV max 25. Extensive osseous metastatic disease with innumerable PSMA-avid lesions, including a large right iliac wing lesion with soft tissue expansion measuring 8.9 x 6.4 cm.   History of Presenting Illness: Discussed the use of AI scribe software for clinical note transcription with the patient, who gave verbal consent to proceed.  History of Present Illness Philip [name] is a 68 year old male with prostate cancer who presents for follow up after PSMA PET scan.  He experiences significant discomfort and pain related to his urinary catheter, describing a sensation of it sliding down, which exacerbates the pain. He manages the discomfort by adjusting the catheter's position, noting that it is less bothersome when sitting still. He wants the catheter removed due to the persistent pain, despite the potential need for another catheter if  he cannot urinate independently.  He experiences occasional weakness but no specific bone pain.  He lives alone and manages his daily activities independently, using a mobility aid around the house. He notes a decrease in appetite and weight loss, expressing a need to gain weight.   Medical History: Past Medical History:  Diagnosis Date   Chronic cholecystitis with calculus 02/04/2013   CVA (cerebral vascular accident) 09/16/2012   Diabetes mellitus without complication (HCC)    borderline   Erectile dysfunction    Gallstones 01/27/2013   GERD (gastroesophageal reflux disease)    H. pylori infection 09/28/2017   Headache(784.0)    Headache, I dont think they are migranes   Hepatitis C    Harvoni , completed 06/2015 (Dr. Efrain) , AUG 2017 SVR   History of kidney stones    history of kidney stones   History of migraine    takes Topamax  daily   Hypertension    takes Hydralazine ,Maxzide ,Clonidine ,and Amlodipine  daily   Hypertension    also takes Metoprolol  daily   Nausea    takes Zofran  daily   Seizures (HCC)    off Dilantin  daily.  last one was when he had a stroke.   Short-term memory loss    due to stroke    Sleep apnea    CPAP- tries to do it everyday.   Stroke (HCC)    .  Short term memory loss.; left sided weakness   Tobacco user    Umbilical hernia 02/04/2013    Surgical history: Past Surgical History:  Procedure Laterality Date   BIOPSY  03/10/2017   Procedure: BIOPSY;  Surgeon: Harvey Margo CROME, MD;  Location: [facility];  Service: Endoscopy;;  gastric biopsy    CHOLECYSTECTOMY N/A 03/09/2013   Procedure: LAPAROSCOPIC CHOLECYSTECTOMY WITH INTRAOPERATIVE CHOLANGIOGRAM;  Surgeon: Donnice POUR.  Belinda, MD;  Location: [facility] OR;  Service: General;  Laterality: N/A;   COLONOSCOPY  06/2014   Dr. Obie: Three sessile polyps were found in the rectum x2 and descending. tubular adenomas. colonoscopy in 06/2019   ESOPHAGOGASTRODUODENOSCOPY (EGD) WITH PROPOFOL  N/A 08/21/2015   Dr.  Harvey: normal esophagus, non-bleeding gastric ulcers/gastritis (H.pylori)    ESOPHAGOGASTRODUODENOSCOPY (EGD) WITH PROPOFOL  N/A 03/10/2017   Procedure: ESOPHAGOGASTRODUODENOSCOPY (EGD) WITH PROPOFOL ;  Surgeon: Harvey Margo CROME, MD;  Location: [facility];  Service: Endoscopy;  Laterality: N/A;  1:00pm-rescheduled to 10\16 @ 8:15am    UMBILICAL HERNIA REPAIR N/A 03/09/2013   Procedure: HERNIA REPAIR UMBILICAL ADULT;  Surgeon: Donnice POUR. Belinda, MD;  Location: [facility] OR;  Service: General;  Laterality: N/A;     Allergies:  has no known allergies.  Medications:  Current Outpatient Medications  Medication Sig Dispense Refill   abiraterone  acetate (ZYTIGA ) 250 MG tablet Take 4 tablets (1,000 mg total) by mouth daily. Take on an empty stomach 1 hour before or 2 hours after a meal 120 tablet 3   amLODipine  (NORVASC ) 10 MG tablet Take 1 tablet (10 mg total) by mouth daily. 90 tablet 3   aspirin  81 MG chewable tablet Chew 1 tablet (81 mg total) by mouth daily. 90 tablet 3   atorvastatin  (LIPITOR) 40 MG tablet Take 1 tablet (40 mg total) by mouth daily. for cholesterol. 90 tablet 3   Blood Pressure Monitor DEVI Use to check BP daily at rest 1 each PRN   docusate (COLACE) 50 MG/5ML liquid Take 10 mLs (100 mg total) by mouth 2 (two) times daily. 100 mL 10   folic acid  (FOLVITE ) 1 MG tablet Take 1 tablet (1 mg total) by mouth daily. 90 tablet 3   gabapentin  (NEURONTIN ) 300 MG capsule Take 2 capsules (600 mg total) by mouth at bedtime. 180 capsule 3   hydrALAZINE  (APRESOLINE ) 25 MG tablet Take 3 tablets (75 mg total) by mouth 3 (three) times daily. 270 tablet 3   levETIRAcetam  (KEPPRA ) 500 MG tablet Take 1 tablet (500 mg total) by mouth 2 (two) times daily. 180 tablet 3   losartan  (COZAAR ) 25 MG tablet Take 1 tablet (25 mg total) by mouth daily. 90 tablet 3   nicotine  (NICODERM CQ  - DOSED IN MG/24 HOURS) 21 mg/24hr patch Place 1 patch (21 mg total) onto the skin daily. 28 patch 0   oxyCODONE -acetaminophen   (PERCOCET) 7.5-325 MG tablet Take 1 tablet by mouth every 4 (four) hours as needed for severe pain (pain score 7-10). Due to metastatic cancer to spine 60 tablet 0   pantoprazole  (PROTONIX ) 40 MG tablet Take 1 tablet (40 mg total) by mouth daily. 90 tablet 3   polyethylene glycol (MIRALAX  / GLYCOLAX ) 17 g packet Take 17 g by mouth daily as needed for moderate constipation. 14 each 0   predniSONE  (DELTASONE ) 5 MG tablet Take 1 tablet (5 mg total) by mouth daily with breakfast. 30 tablet 3   thiamine  (VITAMIN B-1) 100 MG tablet Take 1 tablet (100 mg total) by mouth daily. 100 tablet 3   tiZANidine  (ZANAFLEX ) 4 MG tablet Take 4 mg by mouth every 6 (six) hours as needed.     topiramate  (TOPAMAX ) 50 MG tablet Take 1 tablet (50 mg total) by mouth at bedtime. 90 tablet 0   Vitamin D , Ergocalciferol , (DRISDOL ) 1.25 MG (50000 UNIT) CAPS capsule Take 1 capsule (50,000 Units total) by mouth every 7 (seven) days. 13 capsule 3   Current Facility-Administered Medications  Medication Dose  Route Frequency Provider Last Rate Last Admin   [START ON 04/29/2024] leuprolide  (6 Month) (ELIGARD ) injection 45 mg  45 mg Subcutaneous Q6 months McKenzie, Belvie CROME, MD        Review of Systems: All other systems were reviewed with the patient and are negative except HPI.  Physical Examination: ECOG PERFORMANCE STATUS: 2 - Symptomatic, <50% confined to bed  Vitals:   04/18/24 1119  BP: 108/84  Pulse: 90  Resp: 19  Temp: (!) 97.1 F (36.2 C)  SpO2: 100%   Filed Weights   04/18/24 1119  Weight: 119 lb 6.4 oz (54.2 kg)    GENERAL: Alert and in pain from urinary catheter. NECK: supple, thyroid  normal size, non-tender, without nodularity LYMPH:  no palpable lymphadenopathy in the cervical, axillary or inguinal LUNGS: clear to auscultation and percussion with normal breathing effort HEART: Tachycardic, no murmurs heard ABDOMEN:abdomen soft, non-tender and normal bowel sounds, urinary catheter  present Musculoskeletal:no cyanosis of digits and no clubbing  PSYCH: alert & oriented x 3 with fluent speech   Laboratory Data: I have reviewed the data as listed Lab Results  Component Value Date   WBC 8.1 03/25/2024   HGB 13.0 03/25/2024   HCT 38.7 (L) 03/25/2024   MCV 91.7 03/25/2024   PLT 166 03/25/2024   Recent Labs    12/10/23 0810 12/10/23 1510 12/16/23 0542 12/17/23 0848 01/04/24 1345 03/22/24 1006 03/25/24 1149  NA 137   < > 140 140 142 145 144  K 4.1   < > 3.4* 3.6 4.6 3.7 3.6  CL 98   < > 108 110 106 105 103  CO2 19*   < > 23 20* 21 26 28   GLUCOSE 168*   < > 103* 186* 102* 172* 83  BUN 6*   < > 12 14 16 22 16   CREATININE 1.28*   < > 1.23 1.33* 2.11* 1.26* 1.10  CALCIUM  8.8*   < > 9.4 9.4 10.5* 10.5* 10.6*  GFRNONAA >60   < > >60 58*  --  >60 >60  PROT 8.1  --   --   --  8.0 7.8  --   ALBUMIN 3.3*   < > 2.9*  --  4.3 4.2  --   AST 40  --   --   --  28 75*  --   ALT 15  --   --   --  18 9  --   ALKPHOS 126  --   --   --  234* 269*  --   BILITOT 0.9  --   --   --  0.4 0.9  --    < > = values in this interval not displayed.    Latest Reference Range & Units 03/22/24 10:07  Total Protein ELP 6.0 - 8.5 g/dL 7.5 (C)  Albumin SerPl Elph-Mcnc 2.9 - 4.4 g/dL 3.4 (C)  Albumin/Glob SerPl 0.7 - 1.7  0.9 (C)  Alpha2 Glob SerPl Elph-Mcnc 0.4 - 1.0 g/dL 1.0 (C)  Alpha 1 0.0 - 0.4 g/dL 0.5 (H) (C)  Gamma Glob SerPl Elph-Mcnc 0.4 - 1.8 g/dL 0.9 (C)  M Protein SerPl Elph-Mcnc Not Observed g/dL Not Observed (C)  IFE 1  Comment (C)  Globulin, Total 2.2 - 3.9 g/dL 4.1 (H) (C)  B-Globulin SerPl Elph-Mcnc 0.7 - 1.3 g/dL 1.6 (H) (C)  IgG (Immunoglobin G), Serum 603 - 1,613 mg/dL 8,955  IgM (Immunoglobulin M), Srm 20 - 172 mg/dL 53  IgA 61 - 562 mg/dL  437  (H): Data is abnormally high (C): Corrected   Latest Reference Range & Units 03/22/24 10:06  Testosterone  264 - 916 ng/dL 329    Latest Reference Range & Units 03/22/24 10:06  Kappa free light chain 3.3 - 19.4 mg/L  43.1 (H)  Lambda free light chains 5.7 - 26.3 mg/L 37.0 (H)  Kappa, lambda light chain ratio 0.26 - 1.65  1.16  Prostatic Specific Antigen 0.00 - 4.00 ng/mL 911.34 (H)  (H): Data is abnormally high  Radiographic Studies: I have personally reviewed the radiological images as listed and agreed with the findings in the report.  NM PET (PSMA) SKULL TO MID THIGH EXAM: PROSTATE PET SKULL BASE TO MID THIGHS 04/07/2024 05:37:14 PM  TECHNIQUE:  RADIOPHARMACEUTICAL: 8.33 mCi F-18 flotufolastaf (Posluma ) injected intravenously.  PET imaging was obtained from skull vertex to mid thighs. Computed tomography was used for attenuation correction and localization. Fusion imaging was obtained.  COMPARISON: None available.  CLINICAL HISTORY: Prostate cancer suspected; elevated PSA/spinal lesions.  FINDINGS:  PROSTATE AND PROSTATE BED: Intense radiotracer activity in the posterior left lobe of the prostate gland with a SUVmax of 11.0. Foley catheter extended to the bladder.  LYMPH NODES: Enlarged intensely PSMA avid left obturator node measures 2 cm short axis with SUVmax equal to 25.  BONES: There are multiple PSMA avid skeletal lesions throughout the femurs, pelvis, entire spine, ribs, shoulders, and humeri. There is soft tissue expansion of the lesion in the right iliac wing measuring 8.9 x 6.4 cm. ( interestingly, the soft tissue expansion is non-psma avid while the underlying bone is psma avid. ) Example lesions: near entirety of the sacrum is replaced by PSMA avid tissue with SUVmax of 14.6 for example. Lesion in the right femoral neck with SUVmax of 14.0. Essentially every vertebral body has radiotracer avid PSMA activity. Lesions are too numerous to count. There are lucent lesions on the CT portion of the exam corresponding to the PSMA activity.  OTHER PET FINDINGS: Physiologic activity within the salivary glands, liver, spleen, kidneys, bowel, and urinary bladder. No  pulmonary metastasis. No mediastinal metastasis.  IMPRESSION: 1. Intense radiotracer activity in the posterior left prostate, SUV max 11.0, compatible with primary prostate malignancy. 2. Intensely PSMA-avid left obturator nodal metastasis measuring 2 cm short axis, SUV max 25. 3. Extensive osseous metastatic disease with innumerable PSMA-avid lesions, including a large right iliac wing lesion with soft tissue expansion measuring 8.9 x 6.4 cm.  Electronically signed by: Norleen Boxer MD 04/11/2024 09:06 AM EST RP Workstation: [id number]    ASSESSMENT & PLAN:  Patient is a 68 y.o. 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  Initial PSA: 911.34 Started on degarelix  last month.  -We reviewed the PSMA PET scan together.  Patient has several osseous lesions and lymphadenopathy along with prostate uptake. - Discussed that patient has a large burden of disease but patient is not a candidate for chemotherapy at this time.  He is functionally very limited. -Discussed that according to LATITUDE trial there is significant overall survival benefit with use of abiraterone  and prednisone  with an overall survival of 5-3.3 versus 36.5 months along with prolonged PFS.  Most common side effect include hypertension, hypokalemia along with elevated liver and, arthralgia .  Discussed risk versus benefits in detail and patient is in agreement to proceed with treatment with  abiraterone  and prednisone   Zytiga  planned to reduce tumor burden. Potential side effects include fatigue and decreased blood counts. - Continue degarelix  injections monthly.  Can transition to Eligard  once the PSA level is less than 200 - Monitor blood counts and fatigue monthly.  Return to clinic in 5 weeks with  labs.  Indwelling urinary catheter-associated pain and management Pain due to indwelling urinary catheter. Removal planned due to discomfort.  -Will remove indwelling urinary catheter as per patient's wishes. - Advised to return for catheter reinsertion to the ER if unable to urinate. - Follow up with urologist next week.  Unintentional weight loss Likely related to decreased appetite. Improvement expected with steroid therapy.  - Monitor weight and appetite. - Expect improvement in appetite with steroid therapy.    Orders Placed This Encounter  Procedures   CBC with Differential/Platelet    Standing Status:   Future    Expected Date:   05/16/2024    Expiration Date:   08/14/2024   Comprehensive metabolic panel with GFR    Standing Status:   Future    Expected Date:   05/16/2024    Expiration Date:   08/14/2024   PSA    Standing Status:   Future    Expected Date:   05/16/2024    Expiration Date:   08/14/2024   Testosterone ,Free and Total    Standing Status:   Future    Expected Date:   05/16/2024    Expiration Date:   08/14/2024    The total time spent in the appointment was 23 minutes encounter with patients including review of chart and various tests results, discussions about plan of care and coordination of care plan   All questions were answered. The patient knows to call the clinic with any problems, questions or concerns. No barriers to learning was detected.  Mickiel Dry, MD 11/24/20254:10 PM

## 2024-04-18 ENCOUNTER — Encounter: Payer: Self-pay | Admitting: Oncology

## 2024-04-18 ENCOUNTER — Other Ambulatory Visit: Payer: Self-pay | Admitting: Pharmacy Technician

## 2024-04-18 ENCOUNTER — Other Ambulatory Visit: Payer: Self-pay

## 2024-04-18 ENCOUNTER — Telehealth: Payer: Self-pay | Admitting: Pharmacist

## 2024-04-18 ENCOUNTER — Telehealth: Payer: Self-pay | Admitting: Pharmacy Technician

## 2024-04-18 ENCOUNTER — Other Ambulatory Visit (HOSPITAL_COMMUNITY): Payer: Self-pay

## 2024-04-18 ENCOUNTER — Inpatient Hospital Stay: Attending: Oncology | Admitting: Oncology

## 2024-04-18 VITALS — BP 108/84 | HR 90 | Temp 97.1°F | Resp 19 | Ht 67.0 in | Wt 119.4 lb

## 2024-04-18 DIAGNOSIS — T839XXA Unspecified complication of genitourinary prosthetic device, implant and graft, initial encounter: Secondary | ICD-10-CM | POA: Diagnosis not present

## 2024-04-18 DIAGNOSIS — C7951 Secondary malignant neoplasm of bone: Secondary | ICD-10-CM | POA: Insufficient documentation

## 2024-04-18 DIAGNOSIS — R972 Elevated prostate specific antigen [PSA]: Secondary | ICD-10-CM

## 2024-04-18 DIAGNOSIS — C61 Malignant neoplasm of prostate: Secondary | ICD-10-CM | POA: Insufficient documentation

## 2024-04-18 DIAGNOSIS — C779 Secondary and unspecified malignant neoplasm of lymph node, unspecified: Secondary | ICD-10-CM | POA: Insufficient documentation

## 2024-04-18 DIAGNOSIS — R634 Abnormal weight loss: Secondary | ICD-10-CM | POA: Insufficient documentation

## 2024-04-18 MED ORDER — PREDNISONE 5 MG PO TABS: 5.0000 mg | ORAL_TABLET | Freq: Every day | ORAL | 3 refills | Status: DC

## 2024-04-18 MED ORDER — ABIRATERONE ACETATE 250 MG PO TABS
1000.0000 mg | ORAL_TABLET | Freq: Every day | ORAL | 3 refills | Status: DC
  Filled 2024-04-18: qty 120, 30d supply, fill #0
  Filled 2024-05-12: qty 120, 30d supply, fill #1
  Filled 2024-06-08: qty 120, 30d supply, fill #2

## 2024-04-18 NOTE — Telephone Encounter (Signed)
 Oral Oncology Patient Advocate Encounter  Patient successfully OnBoarded and drug education provided by pharmacist. Medication scheduled to be shipped on 11/25 for delivery on 11/26 from [facility] to patient's address. Patient also knows to call me at [phone] with any questions or concerns regarding receiving medication or if there is any unexpected change in co-pay.    Maryellen Dowdle (Patty) Chet Burnet, CPhT  [facility], Zelda Salmon, [facility] - Oral Chemotherapy Patient Advocate Specialist III Phone: [phone]  Fax: [phone]

## 2024-04-18 NOTE — Telephone Encounter (Signed)
 Clinical Pharmacist Practitioner Encounter   [facility] (Specialty) will deliver medication to patient on 04/20/2024.  Patient knows to start once they have medication in hand.   Patient Education I spoke with patient for overview of new oral chemotherapy medication: Zytiga  (abiraterone ) for the treatment of newly diagnosed castration sensitive breast cancer in conjunction with prednisone  and ADT, planned duration until disease progression or unacceptable drug toxicity.   Treatment goal: Palliative  Counseled patient on administration, dosing, side effects, monitoring, drug-food interactions, safe handling, storage, and disposal. Patient will take: Abiraterone : Take 4 tablets (1,000 mg total) by mouth daily. Take on an empty stomach 1 hour before or 2 hours after a meal  Prednisone : Take 1 tablet (5 mg total) by mouth daily with breakfast.   Side effects include but not limited to: fatigue, hypertension, edema, and decreased WBC.   Hypertension: reviewed s/sx of hypertension, patient reports having a blood pressure cuff at home  Reviewed with patient importance of keeping a medication schedule and plan for any missed doses.  After discussion with patient no patient barriers to medication adherence identified.   Distress evaluation: Distress thermometer completed during telephone call and reviewed with patient. Due to score, social work referral has been sent.  Communication and Learning Assessment Primary learner: Patient and his sister Barriers to learning: No barriers Preferred language: English Learning preferences: Listening Reading   Mr. Bielby voiced understanding and appreciation. All questions answered. Medication handout provided.  Provided patient with Oral Chemotherapy Navigation Clinic phone number. Patient knows to call the office with questions or concerns. Oral Chemotherapy Navigation Clinic will continue to follow.  Harlin Mazzoni N. Plumer Mittelstaedt, PharmD, BCOP,  CPP Hematology/Oncology Clinical Pharmacist [facility]/AP Oral Chemotherapy Navigation Clinic [phone]  04/18/2024 1:55 PM

## 2024-04-18 NOTE — Progress Notes (Signed)
 Specialty Pharmacy Initial Fill Coordination Note  Philip [name] is a 68 y.o. male contacted today regarding initial fill of specialty medication(s) Abiraterone  Acetate (ZYTIGA )   Patient requested Delivery   Delivery date: 04/20/24   Verified address: [address] [name]   Medication will be filled on: 04/19/24   Patient is aware of $0 copayment.   Portia Wisdom (Patty) Chet Burnet, CPhT  [facility], [place], [facility] - Oral Chemotherapy Patient Advocate Specialist III Phone: [phone]  Fax: [phone]

## 2024-04-18 NOTE — Progress Notes (Signed)
 Patient education documented in EPIC note on 04/18/24.

## 2024-04-18 NOTE — Telephone Encounter (Signed)
 Oral Oncology Patient Advocate Encounter   New authorization   Received notification that prior authorization for abiraterone  is required.   PA submitted on CMM via Latent Key [id number] Status is pending     Eastyn Skalla (Patty) Chet Burnet, CPhT  [facility], [place], [facility] - Oral Chemotherapy Patient Advocate Specialist III Phone: [phone]  Fax: [phone]

## 2024-04-18 NOTE — Telephone Encounter (Signed)
 Oral Oncology Patient Advocate Encounter  Prior Authorization for abiraterone  has been approved.    PA# [id number] Effective dates: 04/18/2024 through 05/25/2025  Patients co-pay is $0.    Miaya Lafontant (Patty) Chet Burnet, CPhT  [facility], [place], [facility]/Oncology - Oral Chemotherapy Patient Advocate Specialist III Phone: [phone]  Fax: [phone]

## 2024-04-18 NOTE — Patient Instructions (Addendum)
 [facility]  Discharge Instructions  You were seen and examined today by Dr. Davonna. Dr. Davonna is a medical oncologist, meaning that she specializes in the treatment of cancer diagnoses. Dr. Davonna discussed your past medical history, family history of cancers, and the events that led to you being here today.  You were referred to Dr. Davonna due to a new diagnosis of Stage IV Prostate Cancer. This means that the cancer began in the prostate and has spread beyond the prostate to lymph nodes and bones. This does unfortunately mean that the cancer cannot be cured but can be controlled.  Continue Eligard  injections with Urology as scheduled.  Dr. Davonna has recommended we start you on a pill for treatment of your prostate cancer. The pill is called Zytiga . It is taken daily on an empty stomach. You will need to wait at least one hour prior to eating after you take the pill. We also give you a steroid pill called prednisone . You will take this daily with breakfast. The prednisone  will help alleviate side effects of the Zytiga . The Zytiga  will come from a specialty pharmacy and be delivered to your house. Expect phone calls from the patient advocate and the pharmacist at the specialty pharmacy.   Follow-up as scheduled.   Thank you for choosing [facility] to provide your oncology and hematology care.   To afford each patient quality time with our provider, please arrive at least 15 minutes before your scheduled appointment time. You may need to reschedule your appointment if you arrive late (10 or more minutes). Arriving late affects you and other patients whose appointments are after yours.  Also, if you miss three or more appointments without notifying the office, you may be dismissed from the clinic at the provider's discretion.    Again, thank you for choosing [facility].  Our hope is that these requests will decrease the  amount of time that you wait before being seen by our physicians.   If you have a lab appointment with the Cancer Center - please note that after April 8th, all labs will be drawn in the cancer center.  You do not have to check in or register with the main entrance as you have in the past but will complete your check-in at the cancer center.            _____________________________________________________________  Should you have questions after your visit to [facility], please contact our office at [phone] and follow the prompts.  Our office hours are 8:00 a.m. to 4:30 p.m. Monday - Thursday and 8:00 a.m. to 2:30 p.m. Friday.  Please note that voicemails left after 4:00 p.m. may not be returned until the following business day.  We are closed weekends and all major holidays.  You do have access to a nurse 24-7, just call the main number to the clinic [phone] and do not press any options, hold on the line and a nurse will answer the phone.    For prescription refill requests, have your pharmacy contact our office and allow 72 hours.    Masks are no longer required in the cancer centers. If you would like for your care team to wear a mask while they are taking care of you, please let them know. You may have one support person who is at least 68 years old accompany you for your appointments.

## 2024-04-18 NOTE — Telephone Encounter (Signed)
 Clinical Pharmacist Practitioner Encounter   Received new prescription for Zytiga  (abiraterone ) for the treatment of newly diagnosed castration sensitive breast cancer in conjunction with prednisone  and ADT, planned duration until disease progression or unacceptable drug toxicity.  CMP from 03/22/24 assessed, no relevant lab abnormalities. Prescription dose and frequency assessed.   Current medication list in Epic reviewed, one DDIs with abiraterone  identified: Atorvastatin : Abiraterone  Acetate may increase myopathic (rhabdomyolysis) effects of HMG-CoA Reductase Inhibitors (Statins). Monitor for evidence of muscle toxicities (eg, myopathy, rhabdomyolysis).    Evaluated chart and no patient barriers to medication adherence identified.   Prescription has been e-scribed to the [facility] for benefits analysis and approval.  Oral Oncology Clinic will continue to follow for insurance authorization, copayment issues, initial counseling and start date.   Artemis Loyal N. Triniti Gruetzmacher, PharmD, BCOP, CPP Hematology/Oncology Clinical Pharmacist [facility] Oral Chemotherapy Navigation Clinic [phone]  04/18/2024 1:14 PM

## 2024-04-18 NOTE — Progress Notes (Signed)
  Rapid Diagnostic Service for Malignancies [facility]  Diagnostic Nurse Navigator Treatment Team Hand-Off Note  04/18/24  Patient Name:  Philip [name] Patient MRN:  [id number] Patient DOB:  Nov 15, 1955   Patient Care Team: Zollie Lowers, MD as PCP - General (Family Medicine) Delores Dena RAMAN, RN as Oncology Nurse Navigator Davonna Siad, MD as Medical Oncologist (Medical Oncology) Celestia Joesph SQUIBB, RN as Oncology Nurse Navigator (Medical Oncology)  Chief Complaint Spinal lesions  Oncology History   No history exists.    Prostate cancer with bone metastases  SDOH Screening and Interventions Updated:  Yes  SDOH Screenings   Food Insecurity: No Food Insecurity (12/07/2023)  Housing: Unknown (12/07/2023)  Transportation Needs: No Transportation Needs (12/07/2023)  Utilities: Not At Risk (12/07/2023)  Alcohol Screen: Low Risk  (12/07/2023)  Depression (PHQ2-9): Low Risk  (04/18/2024)  Recent Concern: Depression (PHQ2-9) - High Risk (03/02/2024)  Financial Resource Strain: Low Risk  (12/07/2023)  Physical Activity: Insufficiently Active (12/07/2023)  Social Connections: Socially Isolated (12/07/2023)  Stress: No Stress Concern Present (12/07/2023)  Tobacco Use: High Risk (04/18/2024)  Health Literacy: Adequate Health Literacy (12/07/2023)     Genetics Assessment Completed:  No Genetics Referral Made:  no  Care Team Updated:  Yes

## 2024-04-18 NOTE — Progress Notes (Unsigned)
 Pts catheter removed Per Dr. Davonna Orders and charge nurse Dorothyann Oman. Balloon deflated 10 ML and Foley cath removed without complications. Pt understands the need to void within 6hrs if he does not then he needs to call his MD for follow up

## 2024-04-20 ENCOUNTER — Inpatient Hospital Stay: Admitting: Licensed Clinical Social Worker

## 2024-04-20 DIAGNOSIS — C61 Malignant neoplasm of prostate: Secondary | ICD-10-CM

## 2024-04-22 ENCOUNTER — Encounter: Payer: Self-pay | Admitting: Oncology

## 2024-04-22 NOTE — Progress Notes (Signed)
 [facility] Clinical Social Work  Initial Assessment   Philip [name] is a 68 y.o. year old male. Clinical Social Work was referred by medical provider for assessment of psychosocial needs.  Pt's sister [name] provided all details for assessment.  SDOH (Social Determinants of Health) assessments performed: Yes SDOH Interventions    Flowsheet Row Clinical Support from 12/07/2023 in [facility] Clinical Support from 12/04/2022 in [facility] Clinical Support from 12/03/2021 in [facility] Office Visit from 05/15/2021 in [facility] Office Visit from 03/20/2021 in [facility]  SDOH Interventions       Food Insecurity Interventions Intervention Not Indicated Intervention Not Indicated Intervention Not Indicated -- --  Housing Interventions Intervention Not Indicated Intervention Not Indicated Intervention Not Indicated -- --  Transportation Interventions Intervention Not Indicated -- Intervention Not Indicated -- --  Utilities Interventions Intervention Not Indicated Intervention Not Indicated -- -- --  Alcohol Usage Interventions Intervention Not Indicated (Score <7) Intervention Not Indicated (Score <7) -- -- --  Depression Interventions/Treatment  PHQ2-9 Score <4 Follow-up Not Indicated -- -- Counseling PHQ2-9 Score <4 Follow-up Not Indicated  Financial Strain Interventions Intervention Not Indicated Intervention Not Indicated Intervention Not Indicated -- --  Physical Activity Interventions Intervention Not Indicated Intervention Not Indicated Intervention Not Indicated -- --  Stress Interventions Intervention Not Indicated Intervention Not Indicated Intervention Not Indicated -- --  Social Connections Interventions Intervention Not Indicated Intervention Not Indicated Intervention Not Indicated -- --  Health Literacy Interventions  Intervention Not Indicated Intervention Not Indicated -- -- --    SDOH Screenings   Food Insecurity: No Food Insecurity (12/07/2023)  Housing: Unknown (12/07/2023)  Transportation Needs: No Transportation Needs (12/07/2023)  Utilities: Not At Risk (12/07/2023)  Alcohol Screen: Low Risk  (12/07/2023)  Depression (PHQ2-9): Low Risk  (04/18/2024)  Recent Concern: Depression (PHQ2-9) - High Risk (03/02/2024)  Financial Resource Strain: Low Risk  (12/07/2023)  Physical Activity: Insufficiently Active (12/07/2023)  Social Connections: Socially Isolated (12/07/2023)  Stress: No Stress Concern Present (12/07/2023)  Tobacco Use: High Risk (04/18/2024)  Health Literacy: Adequate Health Literacy (12/07/2023)    PHQ 2/9:    04/18/2024   11:19 AM 03/25/2024   10:15 AM 03/22/2024    8:37 AM  Depression screen PHQ 2/9  Decreased Interest 0 0 0  Down, Depressed, Hopeless 0 0 0  PHQ - 2 Score 0 0 0  Altered sleeping 0 0 0  Tired, decreased energy 1 1 0  Change in appetite 1 1 0  Feeling bad or failure about yourself  0 0 0  Trouble concentrating 0 0 0  Moving slowly or fidgety/restless 0 0 0  Suicidal thoughts 0 0 0  PHQ-9 Score 2 2  0      Data saved with a previous flowsheet row definition     Distress Screen completed: Yes    04/18/2024    2:00 PM  ONCBCN DISTRESS SCREENING  Screening Type Initial Screening  How much distress have you been experiencing in the past week? (0-10) 5  Emotional concerns type Worry or anxiety      Family/Social Information:  Housing Arrangement: patient lives alone.  Pt reportedly uses a cane and walker for ambulation. Family members/support persons in your life? Per [name] she resides in [place], but is responsible for pt's finances and organizing pt's medication so she sees him regularly.  Pt has 3 sisters and a  brother who reside closer to him, but do not offer as much support.  Transportation concerns: Pt's siblings or friends are anticipated to  assist pt w/ transportation  Employment: Retired .  Income source: Actor concerns: No Type of concern: None Food access concerns: yes Religious or spiritual practice: Yes-Christian Advanced directives: Not known Services Currently in place:  none  Coping/ Adjustment to diagnosis: Patient understands treatment plan and what happens next? yes Concerns about diagnosis and/or treatment: How will I care for myself and Quality of life Patient reported stressors: Anxiety/ nervousness, Adjusting to my illness, and Physical issues Hopes and/or priorities: pt's priority is to continue treatment w/ the hope of positive results. Patient enjoys not addressed Current coping skills/ strengths: Capable of independent living , Motivation for treatment/growth , and Physical Health     SUMMARY: Current SDOH Barriers:  No barriers identified at this time.  Clinical Social Work Clinical Goal(s):  No clinical social work goals at this time  Interventions: Discussed common feeling and emotions when being diagnosed with cancer, and the importance of support during treatment Informed patient of the support team roles and support services at [facility] Provided CSW contact information and encouraged patient to call with any questions or concerns Referred patient to community resources: Wadie Rung for supportive services.  Informed pt's sister of the [employer] should additional DME be needed.     Follow Up Plan: Patient will contact CSW with any support or resource needs Patient verbalizes understanding of plan: Yes    Devere JONELLE Manna, LCSW Clinical Social Worker [facility]

## 2024-04-25 ENCOUNTER — Inpatient Hospital Stay: Attending: Oncology

## 2024-04-25 VITALS — BP 100/76 | HR 61 | Temp 96.9°F | Resp 16

## 2024-04-25 DIAGNOSIS — Z5111 Encounter for antineoplastic chemotherapy: Secondary | ICD-10-CM | POA: Diagnosis present

## 2024-04-25 DIAGNOSIS — R972 Elevated prostate specific antigen [PSA]: Secondary | ICD-10-CM

## 2024-04-25 DIAGNOSIS — C7951 Secondary malignant neoplasm of bone: Secondary | ICD-10-CM | POA: Diagnosis not present

## 2024-04-25 DIAGNOSIS — C61 Malignant neoplasm of prostate: Secondary | ICD-10-CM | POA: Insufficient documentation

## 2024-04-25 DIAGNOSIS — E298 Other testicular dysfunction: Secondary | ICD-10-CM | POA: Diagnosis not present

## 2024-04-25 DIAGNOSIS — C779 Secondary and unspecified malignant neoplasm of lymph node, unspecified: Secondary | ICD-10-CM | POA: Diagnosis not present

## 2024-04-25 DIAGNOSIS — M899 Disorder of bone, unspecified: Secondary | ICD-10-CM

## 2024-04-25 MED ORDER — DEGARELIX ACETATE 80 MG ~~LOC~~ SOLR
80.0000 mg | Freq: Once | SUBCUTANEOUS | Status: AC
  Administered 2024-04-25: 80 mg via SUBCUTANEOUS
  Filled 2024-04-25: qty 4

## 2024-04-25 NOTE — Progress Notes (Signed)
 Patient tolerated Firmagon  injection with no complaints voiced.  Site clean and dry with no bruising or swelling noted at site.  See MAR for details.  Band aid applied.  Patient stable during and after injection.  Vss with discharge and left in satisfactory condition with no s/s of distress noted.   RN went over antihypertensive medications with patient and educated patient on the importance of monitoring blood pressure prior to taking his medications. Pt verbalized understanding and all questions answered at this time. All follow ups as scheduled.   Hamsini Verrilli

## 2024-04-25 NOTE — Patient Instructions (Signed)
Degarelix Injection What is this medication? DEGARELIX (deg a REL ix) treats prostate cancer. It works by decreasing levels of the hormone testosterone in the body. This prevents prostate cancer cells from spreading or growing. It belongs to a group of medications called GnRH blockers. This medicine may be used for other purposes; ask your health care provider or pharmacist if you have questions. COMMON BRAND NAME(S): Degarelix, Deborra Medina What should I tell my care team before I take this medication? They need to know if you have any of these conditions: Diabetes Heart disease Kidney disease Liver disease Low levels of potassium or magnesium in the blood Osteoporosis, weak bones An unusual or allergic reaction to degarelix, mannitol, other medications, foods, dyes, or preservatives If you or your partner are pregnant or trying to get pregnant How should I use this medication? This medication is injected under the skin. It is usually given by your care team in a hospital or clinic setting. It may also be given at home. If you get this medication at home, you will be taught how to prepare and give it. Use exactly as directed. Take it as directed on the prescription label at the same time every day. Keep taking it unless your care team tells you to stop. It is important that you put your used needles and syringes in a special sharps container. Do not put them in a trash can. If you do not have a sharps container, call your pharmacist or care team to get one. Talk to your care team about the use of this medication in children. Special care may be needed. Overdosage: If you think you have taken too much of this medicine contact a poison control center or emergency room at once. NOTE: This medicine is only for you. Do not share this medicine with others. What if I miss a dose? If you get this medication at the hospital or clinic: It is important not to miss your dose. Call your care team if you are  unable to keep an appointment. If you give yourself this medication at home: If you miss a dose, take it as soon as you can. If it is almost time for your next dose, take only that dose. Do not take double or extra doses. Call your care team with questions. What may interact with this medication? Do not take this medication with any of the following: Cisapride Dronedarone Pimozide Thioridazine This medication may also interact with the following: Other medications that cause heart rhythm changes This list may not describe all possible interactions. Give your health care provider a list of all the medicines, herbs, non-prescription drugs, or dietary supplements you use. Also tell them if you smoke, drink alcohol, or use illegal drugs. Some items may interact with your medicine. What should I watch for while using this medication? Your condition will be monitored carefully while you are receiving this medication. You may need blood work while taking this medication. Do not rub or scratch injection site. There may be a lump at the injection site, or it may be red or sore for a few days after your dose. This medication may cause infertility. Talk to your care team if you are concerned about your fertility. What side effects may I notice from receiving this medication? Side effects that you should report to your care team as soon as possible: Allergic reactions or angioedema--skin rash, itching or hives, swelling of the face, eyes, lips, tongue, arms, or legs, trouble swallowing or breathing Heart  rhythm changes--fast or irregular heartbeat, dizziness, feeling faint or lightheaded, chest pain, trouble breathing Side effects that usually do not require medical attention (report to your care team if they continue or are bothersome): Hot flashes Pain, redness, or irritation at injection site Weight gain This list may not describe all possible side effects. Call your doctor for medical advice about  side effects. You may report side effects to FDA at 1-800-FDA-1088. Where should I keep my medication? Keep out of the reach of children and pets. This medication is usually given in a hospital or clinic and will not be stored at home. In rare cases, this medication may be given at home. If you are using this medication at home, you will be instructed on how to store this medication. Get rid of any unused medication after the expiration date. To get rid of medications that are no longer needed or have expired: Take the medication to a medication take-back program. Check with your pharmacy or law enforcement to find a location. If you cannot return the medication, ask your pharmacist or care team how to get rid of this medication safely. NOTE: This sheet is a summary. It may not cover all possible information. If you have questions about this medicine, talk to your doctor, pharmacist, or health care provider.  2024 Elsevier/Gold Standard (2021-10-03 00:00:00)

## 2024-04-26 ENCOUNTER — Telehealth: Payer: Self-pay | Admitting: Family Medicine

## 2024-04-26 ENCOUNTER — Ambulatory Visit: Payer: Self-pay

## 2024-04-26 ENCOUNTER — Encounter: Payer: Self-pay | Admitting: *Deleted

## 2024-04-26 DIAGNOSIS — M8458XA Pathological fracture in neoplastic disease, other specified site, initial encounter for fracture: Secondary | ICD-10-CM

## 2024-04-26 DIAGNOSIS — G893 Neoplasm related pain (acute) (chronic): Secondary | ICD-10-CM

## 2024-04-26 DIAGNOSIS — C7949 Secondary malignant neoplasm of other parts of nervous system: Secondary | ICD-10-CM

## 2024-04-26 NOTE — Telephone Encounter (Signed)
 Taper off of hydralazine  by taking one 2tiwcw a day for 3 days, then one a day for three days then DC the medication. He should be getting pain meds from his cancer doctor now.

## 2024-04-26 NOTE — Telephone Encounter (Signed)
 FYI Only or Action Required?: Action required by provider: update on patient condition.  Patient was last seen in primary care on 04/04/2024 by Philip Lowers, MD.  Called Nurse Triage reporting Hypotension.  Triage Disposition: See Physician Within 24 Hours  Patient/caregiver understands and will follow disposition?: No, refuses disposition         Copied from CRM #[id number]. Topic: Clinical - Red Word Triage >> Apr 26, 2024  2:51 PM Philip [name] wrote: Red Word that prompted transfer to Nurse Triage: Philip [name] the sister of the patient called in stating she is very concerned for him. First he is out of his pain meds which I put a refill request in for, secondly she said he got an injection yesterday at the [facility] in [place] and is still hurting from it and thirdly his blood pressure is so low that the cuff will not read it. She states it says to low to read. She is concerned because he is on 3 different types of bp meds and takes 5 a day. I will transfer her to E2C2 NT. Reason for Disposition  [1] Systolic BP 90-110 AND [2] taking blood pressure medications AND [3] NOT feeling weak or lightheaded  Answer Assessment - Initial Assessment Questions This RN recommends pt schedules an appointment to see PCP within 24 hours. Pt does not want to schedule an appointment. Pt would like a call back if BP medications need to be changed. This RN educated pt on new-worsening symptoms and when to call back/seek emergent care. Pt sister verbalized understanding and agrees to plan.   This RN spoke with pt's sister, Philip [name] BP 109/79 currently; Philip [name] is not sure what his BP normally is Pt had an injection in right shoulder yesterday at [facility]. Pt states his shoulder is sore at a 1/10 pain level not that bad Dizzy yesterday but no dizziness now Denies difficulty breathing, weakness, lightheadedness  Current BP meds: hydralazine  3 tablets (75 mg) tid, amlodipine  10 mg, losartan  Pt has not  missed any doses or doubled on doses per Philip [name]'s knowledge  Protocols used: Blood Pressure - Low-A-AH

## 2024-04-26 NOTE — Progress Notes (Signed)
 Patient's sister called to confirm as to if patient was to stop blood pressure medications.  Per visit 12/1 he was advised to take blood pressure prior to taking bp meds and educated on parameters and hold if below parameters.  Sister states that she does not feel he is taking any of his medications correctly and lives alone.  Is to check on him today and return call for further discussion.

## 2024-04-26 NOTE — Telephone Encounter (Unsigned)
 Copied from CRM [id number]. Topic: Clinical - Medication Refill >> Apr 26, 2024  2:42 PM [name] wrote: Medication: oxyCODONE -acetaminophen  (PERCOCET) 7.5-325 MG tablet  Has the patient contacted their pharmacy? Yes   This is the patient's preferred pharmacy:  [facility] - [place], [place] - [address] [place] [address] Phone: [phone] Fax: [phone]       Is this the correct pharmacy for this prescription? Yes If no, delete pharmacy and type the correct one.   Has the prescription been filled recently? No  Is the patient out of the medication? Yes  Has the patient been seen for an appointment in the last year OR does the patient have an upcoming appointment? Yes  Can we respond through MyChart? Yes  Please assist patient further as the sister called in and didn't realize he is completely out. She said he is a cancer patient so he is always hurting and she is worried about him.

## 2024-04-27 NOTE — Telephone Encounter (Signed)
 Let's go with this instead, 2 tabs three times a day for 3 days, then two in the morning, two in the evening and one in the afternoon for three days. Then 2 twice a day for three days. Then 1 twice a day for three days. Then one every evening for three days. Then DC med. If at any point his BP rebounds and starts going over 150/90, back up to the previous step and stay there.

## 2024-04-27 NOTE — Telephone Encounter (Signed)
 Sister informed of  titration schedule. LS

## 2024-04-29 ENCOUNTER — Ambulatory Visit

## 2024-04-29 ENCOUNTER — Other Ambulatory Visit

## 2024-05-03 ENCOUNTER — Other Ambulatory Visit: Payer: Self-pay | Admitting: Family Medicine

## 2024-05-03 DIAGNOSIS — N529 Male erectile dysfunction, unspecified: Secondary | ICD-10-CM

## 2024-05-06 ENCOUNTER — Ambulatory Visit

## 2024-05-06 DIAGNOSIS — R972 Elevated prostate specific antigen [PSA]: Secondary | ICD-10-CM

## 2024-05-06 NOTE — Progress Notes (Signed)
 Eligard  SubQ Injection   Due to Prostate Cancer patient is present today for a Eligard  Injection. Order reviewed. Prior Authorization reviewed.  Medication: Eligard  6 month Dose: 45 mg  Location: left lower abdomin  site prepped and cleaned with alcohol prior to giving injection.  Lot: [id number] Exp: 08/24/2025  Patient tolerated well, no complications were noted  Performed by: Exie T. CMA  Per Dr. Sherrilee patient is to continue therapy for 6 months . A reminder continue on Vitamin D  800-1000iu and Calcium  1000-1200mg  daily while on Androgen Deprivation Therapy.  PA approval dates:

## 2024-05-10 ENCOUNTER — Other Ambulatory Visit

## 2024-05-10 ENCOUNTER — Other Ambulatory Visit: Payer: Self-pay

## 2024-05-11 ENCOUNTER — Other Ambulatory Visit

## 2024-05-11 ENCOUNTER — Other Ambulatory Visit: Payer: Self-pay

## 2024-05-12 ENCOUNTER — Other Ambulatory Visit: Payer: Self-pay

## 2024-05-12 ENCOUNTER — Other Ambulatory Visit (HOSPITAL_COMMUNITY): Payer: Self-pay

## 2024-05-12 LAB — PSA: Prostate Specific Ag, Serum: 3.8 ng/mL (ref 0.0–4.0)

## 2024-05-12 LAB — TESTOSTERONE: Testosterone: <3 ng/dL — ABNORMAL LOW (ref 264–916)

## 2024-05-12 NOTE — Progress Notes (Signed)
 Specialty Pharmacy Ongoing Clinical Assessment Note  I spoke to the patient's sister, [name]. Philip [name] is a 68 y.o. male who is being followed by the specialty pharmacy service for RxSp Oncology   Patient's specialty medication(s) reviewed today: Abiraterone  Acetate (ZYTIGA )   Missed doses in the last 4 weeks: 0   Patient/Caregiver did not have any additional questions or concerns.   Therapeutic benefit summary: Patient is achieving benefit   Adverse events/side effects summary: Experienced adverse events/side effects (occasional diarrhea, recommended Imodium PRN)   Patient's therapy is appropriate to: Continue    Goals Addressed             This Visit's Progress    Slow Disease Progression   On track    Patient is on track. Patient will maintain adherence.  Patient's PSA has declined to 3.8 ng/mL as of 05/11/24; was previously 307 ng/mL 4 months prior.         Follow up: 3 months  Silvano LOISE Dolly Specialty Pharmacist

## 2024-05-12 NOTE — Progress Notes (Signed)
 Specialty Pharmacy Refill Coordination Note  I spoke to the patient's sister, Philip [name]. Philip [name] is a 69 y.o. male contacted today regarding refills of specialty medication(s) Abiraterone  Acetate (ZYTIGA )   Patient requested Delivery   Delivery date: 05/16/24   Verified address: [address] [place] [address] (sister's address)   Medication will be filled on: 05/13/24

## 2024-05-13 ENCOUNTER — Other Ambulatory Visit: Payer: Self-pay

## 2024-05-23 ENCOUNTER — Inpatient Hospital Stay

## 2024-05-23 ENCOUNTER — Encounter: Payer: Self-pay | Admitting: *Deleted

## 2024-05-23 VITALS — BP 118/85 | HR 74 | Temp 98.0°F | Resp 18

## 2024-05-23 DIAGNOSIS — C61 Malignant neoplasm of prostate: Secondary | ICD-10-CM

## 2024-05-23 DIAGNOSIS — Z5111 Encounter for antineoplastic chemotherapy: Secondary | ICD-10-CM | POA: Diagnosis not present

## 2024-05-23 DIAGNOSIS — M899 Disorder of bone, unspecified: Secondary | ICD-10-CM

## 2024-05-23 DIAGNOSIS — R972 Elevated prostate specific antigen [PSA]: Secondary | ICD-10-CM

## 2024-05-23 LAB — CBC WITH DIFFERENTIAL/PLATELET
Abs Immature Granulocytes: 0.02 K/uL (ref 0.00–0.07)
Basophils Absolute: 0.1 K/uL (ref 0.0–0.1)
Basophils Relative: 1 %
Eosinophils Absolute: 0.4 K/uL (ref 0.0–0.5)
Eosinophils Relative: 8 %
HCT: 42.7 % (ref 39.0–52.0)
Hemoglobin: 14.1 g/dL (ref 13.0–17.0)
Immature Granulocytes: 0 %
Lymphocytes Relative: 34 %
Lymphs Abs: 1.7 K/uL (ref 0.7–4.0)
MCH: 30.5 pg (ref 26.0–34.0)
MCHC: 33 g/dL (ref 30.0–36.0)
MCV: 92.4 fL (ref 80.0–100.0)
Monocytes Absolute: 0.4 K/uL (ref 0.1–1.0)
Monocytes Relative: 8 %
Neutro Abs: 2.5 K/uL (ref 1.7–7.7)
Neutrophils Relative %: 49 %
Platelets: 154 K/uL (ref 150–400)
RBC: 4.62 MIL/uL (ref 4.22–5.81)
RDW: 14.6 % (ref 11.5–15.5)
WBC: 5.1 K/uL (ref 4.0–10.5)
nRBC: 0 % (ref 0.0–0.2)

## 2024-05-23 LAB — COMPREHENSIVE METABOLIC PANEL WITH GFR
ALT: 9 U/L (ref 0–44)
AST: 19 U/L (ref 15–41)
Albumin: 4.1 g/dL (ref 3.5–5.0)
Alkaline Phosphatase: 214 U/L — ABNORMAL HIGH (ref 38–126)
Anion gap: 12 (ref 5–15)
BUN: 12 mg/dL (ref 8–23)
CO2: 24 mmol/L (ref 22–32)
Calcium: 9.7 mg/dL (ref 8.9–10.3)
Chloride: 108 mmol/L (ref 98–111)
Creatinine, Ser: 0.81 mg/dL (ref 0.61–1.24)
GFR, Estimated: >60 mL/min
Glucose, Bld: 94 mg/dL (ref 70–99)
Potassium: 4 mmol/L (ref 3.5–5.1)
Sodium: 144 mmol/L (ref 135–145)
Total Bilirubin: 0.3 mg/dL (ref 0.0–1.2)
Total Protein: 7 g/dL (ref 6.5–8.1)

## 2024-05-23 LAB — PSA: Prostatic Specific Antigen: 1.42 ng/mL (ref 0.00–4.00)

## 2024-05-23 MED ORDER — DEGARELIX ACETATE 80 MG ~~LOC~~ SOLR
80.0000 mg | Freq: Once | SUBCUTANEOUS | Status: AC
  Administered 2024-05-23: 80 mg via SUBCUTANEOUS
  Filled 2024-05-23: qty 4

## 2024-05-23 NOTE — Patient Instructions (Signed)
Degarelix Injection What is this medication? DEGARELIX (deg a REL ix) treats prostate cancer. It works by decreasing levels of the hormone testosterone in the body. This prevents prostate cancer cells from spreading or growing. It belongs to a group of medications called GnRH blockers. This medicine may be used for other purposes; ask your health care provider or pharmacist if you have questions. COMMON BRAND NAME(S): Degarelix, Deborra Medina What should I tell my care team before I take this medication? They need to know if you have any of these conditions: Diabetes Heart disease Kidney disease Liver disease Low levels of potassium or magnesium in the blood Osteoporosis, weak bones An unusual or allergic reaction to degarelix, mannitol, other medications, foods, dyes, or preservatives If you or your partner are pregnant or trying to get pregnant How should I use this medication? This medication is injected under the skin. It is usually given by your care team in a hospital or clinic setting. It may also be given at home. If you get this medication at home, you will be taught how to prepare and give it. Use exactly as directed. Take it as directed on the prescription label at the same time every day. Keep taking it unless your care team tells you to stop. It is important that you put your used needles and syringes in a special sharps container. Do not put them in a trash can. If you do not have a sharps container, call your pharmacist or care team to get one. Talk to your care team about the use of this medication in children. Special care may be needed. Overdosage: If you think you have taken too much of this medicine contact a poison control center or emergency room at once. NOTE: This medicine is only for you. Do not share this medicine with others. What if I miss a dose? If you get this medication at the hospital or clinic: It is important not to miss your dose. Call your care team if you are  unable to keep an appointment. If you give yourself this medication at home: If you miss a dose, take it as soon as you can. If it is almost time for your next dose, take only that dose. Do not take double or extra doses. Call your care team with questions. What may interact with this medication? Do not take this medication with any of the following: Cisapride Dronedarone Pimozide Thioridazine This medication may also interact with the following: Other medications that cause heart rhythm changes This list may not describe all possible interactions. Give your health care provider a list of all the medicines, herbs, non-prescription drugs, or dietary supplements you use. Also tell them if you smoke, drink alcohol, or use illegal drugs. Some items may interact with your medicine. What should I watch for while using this medication? Your condition will be monitored carefully while you are receiving this medication. You may need blood work while taking this medication. Do not rub or scratch injection site. There may be a lump at the injection site, or it may be red or sore for a few days after your dose. This medication may cause infertility. Talk to your care team if you are concerned about your fertility. What side effects may I notice from receiving this medication? Side effects that you should report to your care team as soon as possible: Allergic reactions or angioedema--skin rash, itching or hives, swelling of the face, eyes, lips, tongue, arms, or legs, trouble swallowing or breathing Heart  rhythm changes--fast or irregular heartbeat, dizziness, feeling faint or lightheaded, chest pain, trouble breathing Side effects that usually do not require medical attention (report to your care team if they continue or are bothersome): Hot flashes Pain, redness, or irritation at injection site Weight gain This list may not describe all possible side effects. Call your doctor for medical advice about  side effects. You may report side effects to FDA at 1-800-FDA-1088. Where should I keep my medication? Keep out of the reach of children and pets. This medication is usually given in a hospital or clinic and will not be stored at home. In rare cases, this medication may be given at home. If you are using this medication at home, you will be instructed on how to store this medication. Get rid of any unused medication after the expiration date. To get rid of medications that are no longer needed or have expired: Take the medication to a medication take-back program. Check with your pharmacy or law enforcement to find a location. If you cannot return the medication, ask your pharmacist or care team how to get rid of this medication safely. NOTE: This sheet is a summary. It may not cover all possible information. If you have questions about this medicine, talk to your doctor, pharmacist, or health care provider.  2024 Elsevier/Gold Standard (2021-10-03 00:00:00)

## 2024-05-23 NOTE — Progress Notes (Signed)
 Patient tolerated Firmagon  injection with no complaints voiced.  Site clean and dry with no bruising or swelling noted at site.  See MAR for details.  Band aid applied.  Patient stable during and after injection.  Vss with discharge and left in satisfactory condition with no s/s of distress noted. All follow ups  as scheduled.   Lorma Heater

## 2024-05-24 LAB — TESTOSTERONE,FREE AND TOTAL
Testosterone, Free: <0.2 pg/mL — ABNORMAL LOW (ref 6.6–18.1)
Testosterone: <3 ng/dL — ABNORMAL LOW (ref 264–916)

## 2024-05-30 ENCOUNTER — Inpatient Hospital Stay: Attending: Oncology | Admitting: Oncology

## 2024-05-30 ENCOUNTER — Inpatient Hospital Stay: Admitting: Dietician

## 2024-05-30 VITALS — BP 108/82 | HR 71 | Temp 98.1°F | Resp 18 | Wt 123.7 lb

## 2024-05-30 DIAGNOSIS — C61 Malignant neoplasm of prostate: Secondary | ICD-10-CM | POA: Diagnosis not present

## 2024-05-30 DIAGNOSIS — Z79899 Other long term (current) drug therapy: Secondary | ICD-10-CM | POA: Diagnosis not present

## 2024-05-30 DIAGNOSIS — M899 Disorder of bone, unspecified: Secondary | ICD-10-CM

## 2024-05-30 DIAGNOSIS — Z7952 Long term (current) use of systemic steroids: Secondary | ICD-10-CM | POA: Diagnosis not present

## 2024-05-30 DIAGNOSIS — C7951 Secondary malignant neoplasm of bone: Secondary | ICD-10-CM | POA: Diagnosis not present

## 2024-05-30 DIAGNOSIS — R634 Abnormal weight loss: Secondary | ICD-10-CM | POA: Diagnosis not present

## 2024-05-30 DIAGNOSIS — C779 Secondary and unspecified malignant neoplasm of lymph node, unspecified: Secondary | ICD-10-CM | POA: Diagnosis not present

## 2024-05-30 NOTE — Patient Instructions (Signed)
 [facility] at [facility] Discharge Instructions   You were seen and examined today by Dr. Davonna.  She reviewed the results of your lab work which are normal/stable.   We will proceed with Eligard  injections next week. These will be given every 3 months.   Return as scheduled.    Thank you for choosing [facility] at [facility] to provide your oncology and hematology care.  To afford each patient quality time with our provider, please arrive at least 15 minutes before your scheduled appointment time.   If you have a lab appointment with the Cancer Center please come in thru the Main Entrance and check in at the main information desk.  You need to re-schedule your appointment should you arrive 10 or more minutes late.  We strive to give you quality time with our providers, and arriving late affects you and other patients whose appointments are after yours.  Also, if you no show three or more times for appointments you may be dismissed from the clinic at the providers discretion.     Again, thank you for choosing [facility]  Our hope is that these requests will decrease the amount of time that you wait before being seen by our physicians.       _____________________________________________________________  Should you have questions after your visit to [facility], please contact our office at [phone] and follow the prompts.  Our office hours are 8:00 a.m. and 4:30 p.m. Monday - Friday.  Please note that voicemails left after 4:00 p.m. may not be returned until the following business day.  We are closed weekends and major holidays.  You do have access to a nurse 24-7, just call the main number to the clinic [phone] and do not press any options, hold on the line and a nurse will answer the phone.    For prescription refill requests, have your pharmacy contact our office and allow 72 hours.    Due to Covid,  you will need to wear a mask upon entering the hospital. If you do not have a mask, a mask will be given to you at the Main Entrance upon arrival. For doctor visits, patients may have 1 support person age 27 or older with them. For treatment visits, patients can not have anyone with them due to social distancing guidelines and our immunocompromised population.

## 2024-05-30 NOTE — Progress Notes (Signed)
 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  Initial PSA: 911.34 Extensive oncology history below  -Patient currently on abiraterone  and prednisone  and tolerating very well.  Continue abiraterone  1000 mg daily and prednisone  5 mg daily. - Patient currently on degarelix , will change to Eligard  after insurance approval to be given every 3 months. - Labs reviewed today: CMP: Normal LFTs, normal creatinine.  CBC: WNL, PSA: 1.42 - Patient has significant improvement in PSA.  Return to clinic in 3 months with labs  Bone metastasis Patient has extensive bone metastasis with no current localized pain Can consider bone modifying agents at disease progression when it is castration resistant  - No indication for bone modifying agents in castration sensitive metastatic prostate cancer  Unintentional weight loss Improved after starting treatment.  Reports eating all the time Patient has an appointment with nutritionist today. Gained 4 pounds since the last visit.   - Monitor weight and appetite. - Expect improvement in appetite with steroid therapy.    The patient understands the plans discussed today and is in agreement with them.  He knows  to contact our office if he develops concerns prior to his next appointment.  15 minutes of total time was spent for this patient encounter, including preparation,face-to-face counseling with the patient and coordination of care, physical exam, and documentation of the encounter.   Siad Davonna, MD  [facility] [place] - A DEPT OF [facility] [address] [place] [address] Dept: [phone] Dept Fax: [phone]   No orders of the defined types were placed in this encounter.    ONCOLOGY HISTORY:   Diagnosis: Left-sided castrate sensitive prostate cancer   Presentation: Significant back pain -03/14/2024: MRI lumbar spine: Advanced and widespread osseous metastatic disease involving the lumbar spine, sacrum, and iliac bones, with associated pathologic fractures at L1 and L5, and extraosseous tumor extension at L5 and S1 with potential nerve [name]. Extensive right iliac osseous lesion with cystic and solid components extending into adjacent soft tissues, presumed advanced tumor. -03/22/2024: PSA: 911.34 -03/25/2024:Started on degarelix  240mg   -04/07/2024: Intense radiotracer activity in the posterior left prostate, SUV max 11.0, compatible with primary prostate malignancy. Intensely PSMA-avid left obturator nodal metastasis measuring 2 cm short axis, SUV max 25. Extensive osseous metastatic disease with innumerable PSMA-avid lesions, including a large right iliac wing lesion with soft tissue expansion measuring 8.9 x 6.4 cm. -03/25/2024- Current: Degarelix  -04/20/2024- Current: Abiraterone  + prednisone   Current Treatment:  Abiraterone  + prednisone .   INTERVAL HISTORY:   Discussed the use of AI scribe software for clinical note transcription with the patient, who gave verbal consent to proceed.  History of Present Illness Philip [name] is a 69 year old male with metastatic prostate cancer who presents for  oncology  follow-up after a marked PSA response to therapy. He is accompanied by his 2 sisters today.   He is receiving monthly androgen deprivation therapy and oral agents for prostate cancer. At diagnosis, PSA was 911; most recent value on December 29 was 1.42, indicating significant response to treatment.  He denies pain, urinary symptoms, need for catheterization, abdominal pain, nausea, vomiting, or fatigue. Appetite is preserved and he is able to eat without difficulty.    I have reviewed the past medical history, past surgical history, social history and family history with the patient and they are unchanged from previous note.  ALLERGIES:  has no known allergies.  MEDICATIONS:  Current Outpatient Medications  Medication Sig Dispense Refill   abiraterone  acetate (ZYTIGA ) 250 MG tablet Take 4 tablets (1,000 mg total) by mouth daily. Take on an empty stomach 1 hour before or 2 hours after a meal 120 tablet 3   amLODipine  (NORVASC ) 10 MG tablet Take 1 tablet (10 mg total) by mouth daily. 90 tablet 3   aspirin  81 MG chewable tablet Chew 1 tablet (81 mg total) by mouth daily. 90 tablet 3   atorvastatin  (LIPITOR) 40 MG tablet Take 1 tablet (40 mg total) by mouth daily. for cholesterol. 90 tablet 3   Blood Pressure Monitor DEVI Use to check BP daily at rest 1 each PRN   docusate (COLACE) 50 MG/5ML liquid Take 10 mLs (100 mg total) by mouth 2 (two) times daily. 100 mL 10   folic acid  (FOLVITE ) 1 MG tablet Take 1 tablet (1 mg total) by mouth daily. 90 tablet 3   gabapentin  (NEURONTIN ) 300 MG capsule Take 2 capsules (600 mg total) by mouth at bedtime. 180 capsule 3   hydrALAZINE  (APRESOLINE ) 25 MG tablet Take 3 tablets (75 mg total) by mouth 3 (three) times daily. 270 tablet 3   levETIRAcetam  (KEPPRA ) 500 MG tablet Take 1 tablet (500 mg total) by mouth 2 (two) times daily. 180 tablet 3   losartan  (COZAAR ) 25 MG tablet Take 1 tablet (25 mg total) by mouth daily. 90 tablet 3   nicotine  (NICODERM CQ  -  DOSED IN MG/24 HOURS) 21 mg/24hr patch Place 1 patch (21 mg total) onto the skin daily. 28 patch 0   oxyCODONE -acetaminophen  (PERCOCET) 7.5-325 MG tablet Take 1 tablet by mouth every 4 (four) hours as needed for severe pain (pain score 7-10). Due to metastatic cancer to spine 60 tablet 0   pantoprazole  (PROTONIX ) 40 MG tablet Take 1 tablet (40 mg total) by mouth daily. 90 tablet 3   polyethylene glycol (MIRALAX  / GLYCOLAX ) 17 g packet Take 17 g by mouth daily as needed for moderate constipation. 14 each 0   predniSONE  (DELTASONE ) 5 MG tablet Take 1 tablet (5 mg total) by mouth daily with breakfast. 30 tablet 3   thiamine  (VITAMIN B-1) 100 MG tablet Take 1 tablet (100 mg total) by mouth daily. 100 tablet 3   tiZANidine  (ZANAFLEX ) 4 MG tablet Take 4 mg by mouth every 6 (six) hours as needed.     topiramate  (TOPAMAX ) 50 MG tablet Take 1 tablet (50 mg total) by mouth at bedtime. 90 tablet 0   Vitamin D , Ergocalciferol , (DRISDOL ) 1.25 MG (50000 UNIT) CAPS capsule Take 1 capsule (50,000 Units total) by mouth every 7 (seven) days. 13 capsule 3   Current Facility-Administered Medications  Medication Dose Route Frequency Provider Last Rate Last Admin   leuprolide  (6 Month) (ELIGARD ) injection 45 mg  45 mg Subcutaneous Q6 months McKenzie, Belvie  L, MD   45 mg at 05/06/24 0856     VITALS:  There were no vitals taken for this visit.  Wt Readings from Last 3 Encounters:  04/18/24 119 lb 6.4 oz (54.2 kg)  04/04/24 123 lb (55.8 kg)  03/25/24 125 lb (56.7 kg)    There is no height or weight on file to calculate BMI.  Performance status (ECOG): 2 - Symptomatic, <50% confined to bed  PHYSICAL EXAM:   GENERAL:Frail male in a wheel chair SKIN: skin color, texture, turgor are normal, no rashes or significant lesions LYMPH:  no palpable lymphadenopathy in the cervical, axillary or inguinal LUNGS: clear to auscultation and percussion with normal breathing effort HEART: regular rate & rhythm and no murmurs  and no lower extremity edema ABDOMEN:abdomen soft, non-tender and normal bowel sounds Musculoskeletal:no cyanosis of digits and no clubbing  NEURO: alert & oriented x 3 with fluent speech  LABORATORY DATA:  I have reviewed the data as listed     Component Value Date/Time   NA 144 05/23/2024 1034   NA 142 01/04/2024 1345   K 4.0 05/23/2024 1034   CL 108 05/23/2024 1034   CO2 24 05/23/2024 1034   GLUCOSE 94 05/23/2024 1034   BUN 12 05/23/2024 1034   BUN 16 01/04/2024 1345   CREATININE 0.81 05/23/2024 1034   CREATININE 1.38 (H) 11/06/2017 1004   CALCIUM  9.7 05/23/2024 1034   CALCIUM  8.6 (L) 06/08/2014 0726   PROT 7.0 05/23/2024 1034   PROT 8.0 01/04/2024 1345   ALBUMIN 4.1 05/23/2024 1034   ALBUMIN 4.3 01/04/2024 1345   AST 19 05/23/2024 1034   ALT 9 05/23/2024 1034   ALKPHOS 214 (H) 05/23/2024 1034   BILITOT 0.3 05/23/2024 1034   BILITOT 0.4 01/04/2024 1345   GFRNONAA >60 05/23/2024 1034   GFRNONAA 73 12/13/2012 1257   GFRAA 58 (L) 01/09/2020 1636   GFRAA 84 12/13/2012 1257     Lab Results  Component Value Date   WBC 5.1 05/23/2024   NEUTROABS 2.5 05/23/2024   HGB 14.1 05/23/2024   HCT 42.7 05/23/2024   MCV 92.4 05/23/2024   PLT 154 05/23/2024      Chemistry      Component Value Date/Time   NA 144 05/23/2024 1034   NA 142 01/04/2024 1345   K 4.0 05/23/2024 1034   CL 108 05/23/2024 1034   CO2 24 05/23/2024 1034   BUN 12 05/23/2024 1034   BUN 16 01/04/2024 1345   CREATININE 0.81 05/23/2024 1034   CREATININE 1.38 (H) 11/06/2017 1004      Component Value Date/Time   CALCIUM  9.7 05/23/2024 1034   CALCIUM  8.6 (L) 06/08/2014 0726   ALKPHOS 214 (H) 05/23/2024 1034   AST 19 05/23/2024 1034   ALT 9 05/23/2024 1034   BILITOT 0.3 05/23/2024 1034   BILITOT 0.4 01/04/2024 1345        Latest Reference Range & Units 05/23/24 10:34  Prostatic Specific Antigen 0.00 - 4.00 ng/mL 1.42    RADIOGRAPHIC STUDIES: I have personally reviewed the radiological images as  listed and agreed with the findings in the report.  "

## 2024-05-31 ENCOUNTER — Telehealth: Payer: Self-pay | Admitting: Pharmacy Technician

## 2024-05-31 ENCOUNTER — Other Ambulatory Visit: Payer: Self-pay

## 2024-05-31 ENCOUNTER — Other Ambulatory Visit (HOSPITAL_COMMUNITY): Payer: Self-pay

## 2024-05-31 NOTE — Progress Notes (Signed)
 Orders received as below:  DC Firmagon  80 mg   Initiate orders for Eligard  22.5 mg subcutaneous every 12 weeks, first dose to be 06/23/24.  Supportive plan updated and PA sent to team.  V.O. Dr Ivery Molt, PharmD

## 2024-05-31 NOTE — Telephone Encounter (Signed)
 Oral Oncology Patient Advocate Encounter  Patent Examiner pending income and Social Security verification**   Applied for [employer].  Philip [name] (Patty) Chet Burnet, CPhT  [facility], [place], [facility] - Oral Chemotherapy Patient Advocate Specialist III Phone: [phone]  Fax: [phone]

## 2024-06-02 ENCOUNTER — Other Ambulatory Visit: Payer: Self-pay

## 2024-06-07 ENCOUNTER — Encounter: Payer: Self-pay | Admitting: Oncology

## 2024-06-07 ENCOUNTER — Other Ambulatory Visit (HOSPITAL_COMMUNITY): Payer: Self-pay

## 2024-06-07 NOTE — Telephone Encounter (Signed)
 Oral Oncology Patient Advocate Encounter  Verified that patient has medicaid so he does not need a grant.  Trina Asch (Patty) Chet Burnet, CPhT  [place], [place], [facility] - Oral Chemotherapy Patient Advocate Specialist III Phone: [phone]  Fax: [phone]

## 2024-06-08 ENCOUNTER — Telehealth: Payer: Self-pay

## 2024-06-08 ENCOUNTER — Other Ambulatory Visit: Payer: Self-pay | Admitting: Pharmacy Technician

## 2024-06-08 ENCOUNTER — Other Ambulatory Visit: Payer: Self-pay

## 2024-06-08 NOTE — Telephone Encounter (Signed)
 Copied from CRM #[id number]. Topic: Clinical - Medication Question >> Jun 08, 2024 10:56 AM Sophia H wrote: Reason for CRM: Patients sister called in stating a couple of weeks ago patient was weaned off of his hydrALAZINE  25MG  due to his BP dropping too low. Patient was taking the medication 3 times a day and she is wondering if PCP wants him to start back up, if so how many times per day, etc. Please reach out # [phone]

## 2024-06-08 NOTE — Telephone Encounter (Signed)
 Returned [facility] call, asked if patient has been having elevated blood pressures since discontinuing the hydrALAZINE  25MG , [name] says no, his blood pressures have been running very very good, I was just wondering if he needed to restart the medicine. I advised [name] to keep a check on patients blood pressure and let us  know if blood pressure starts running high. [name] verbalized understanding.

## 2024-06-08 NOTE — Progress Notes (Signed)
 Specialty Pharmacy Refill Coordination Note  Philip [name] is a 69 y.o. male contacted today regarding refills of specialty medication(s) Abiraterone  Acetate (ZYTIGA )  Spoke with sister.  Patient requested Delivery   Delivery date: 06/10/24   Verified address: [address] [place] [address] [place], [place] (sister address)   Medication will be filled on: 06/09/24

## 2024-06-09 ENCOUNTER — Other Ambulatory Visit: Payer: Self-pay

## 2024-06-10 ENCOUNTER — Inpatient Hospital Stay

## 2024-06-15 ENCOUNTER — Emergency Department (HOSPITAL_COMMUNITY)
Admission: EM | Admit: 2024-06-15 | Discharge: 2024-06-16 | Disposition: A | Discharge status: Home / Self Care | Attending: Emergency Medicine | Admitting: Emergency Medicine

## 2024-06-15 ENCOUNTER — Encounter (HOSPITAL_COMMUNITY): Payer: Self-pay

## 2024-06-15 ENCOUNTER — Emergency Department (HOSPITAL_COMMUNITY)

## 2024-06-15 ENCOUNTER — Other Ambulatory Visit: Payer: Self-pay

## 2024-06-15 DIAGNOSIS — Z7982 Long term (current) use of aspirin: Secondary | ICD-10-CM | POA: Insufficient documentation

## 2024-06-15 DIAGNOSIS — R531 Weakness: Secondary | ICD-10-CM | POA: Diagnosis not present

## 2024-06-15 DIAGNOSIS — J111 Influenza due to unidentified influenza virus with other respiratory manifestations: Secondary | ICD-10-CM

## 2024-06-15 DIAGNOSIS — J101 Influenza due to other identified influenza virus with other respiratory manifestations: Secondary | ICD-10-CM | POA: Diagnosis not present

## 2024-06-15 DIAGNOSIS — R791 Abnormal coagulation profile: Secondary | ICD-10-CM | POA: Diagnosis not present

## 2024-06-15 DIAGNOSIS — R4182 Altered mental status, unspecified: Secondary | ICD-10-CM | POA: Insufficient documentation

## 2024-06-15 DIAGNOSIS — Z8546 Personal history of malignant neoplasm of prostate: Secondary | ICD-10-CM | POA: Insufficient documentation

## 2024-06-15 LAB — PROTIME-INR
INR: 1 (ref 0.8–1.2)
Prothrombin Time: 13.9 s (ref 11.4–15.2)

## 2024-06-15 LAB — COMPREHENSIVE METABOLIC PANEL WITH GFR
ALT: 59 U/L — ABNORMAL HIGH (ref 0–44)
AST: 146 U/L — ABNORMAL HIGH (ref 15–41)
Albumin: 4.1 g/dL (ref 3.5–5.0)
Alkaline Phosphatase: 203 U/L — ABNORMAL HIGH (ref 38–126)
Anion gap: 18 — ABNORMAL HIGH (ref 5–15)
BUN: 17 mg/dL (ref 8–23)
CO2: 19 mmol/L — ABNORMAL LOW (ref 22–32)
Calcium: 9.4 mg/dL (ref 8.9–10.3)
Chloride: 107 mmol/L (ref 98–111)
Creatinine, Ser: 1.26 mg/dL — ABNORMAL HIGH (ref 0.61–1.24)
GFR, Estimated: >60 mL/min
Glucose, Bld: 92 mg/dL (ref 70–99)
Potassium: 3.6 mmol/L (ref 3.5–5.1)
Sodium: 144 mmol/L (ref 135–145)
Total Bilirubin: 0.7 mg/dL (ref 0.0–1.2)
Total Protein: 7.4 g/dL (ref 6.5–8.1)

## 2024-06-15 LAB — CBC WITH DIFFERENTIAL/PLATELET
Abs Immature Granulocytes: 0.03 K/uL (ref 0.00–0.07)
Basophils Absolute: 0 K/uL (ref 0.0–0.1)
Basophils Relative: 0 %
Eosinophils Absolute: 0 K/uL (ref 0.0–0.5)
Eosinophils Relative: 0 %
HCT: 41.8 % (ref 39.0–52.0)
Hemoglobin: 13.8 g/dL (ref 13.0–17.0)
Immature Granulocytes: 1 %
Lymphocytes Relative: 10 %
Lymphs Abs: 0.5 K/uL — ABNORMAL LOW (ref 0.7–4.0)
MCH: 30.3 pg (ref 26.0–34.0)
MCHC: 33 g/dL (ref 30.0–36.0)
MCV: 91.7 fL (ref 80.0–100.0)
Monocytes Absolute: 0.8 K/uL (ref 0.1–1.0)
Monocytes Relative: 15 %
Neutro Abs: 3.7 K/uL (ref 1.7–7.7)
Neutrophils Relative %: 74 %
Platelets: 92 K/uL — ABNORMAL LOW (ref 150–400)
RBC: 4.56 MIL/uL (ref 4.22–5.81)
RDW: 14.6 % (ref 11.5–15.5)
WBC: 5.1 K/uL (ref 4.0–10.5)
nRBC: 0 % (ref 0.0–0.2)

## 2024-06-15 LAB — RESP PANEL BY RT-PCR (RSV, FLU A&B, COVID)  RVPGX2
Influenza A by PCR: POSITIVE — AB
Influenza B by PCR: NEGATIVE
Resp Syncytial Virus by PCR: NEGATIVE
SARS Coronavirus 2 by RT PCR: NEGATIVE

## 2024-06-15 LAB — LACTIC ACID, PLASMA
Lactic Acid, Venous: 2 mmol/L (ref 0.5–1.9)
Lactic Acid, Venous: 1.5 mmol/L (ref 0.5–1.9)

## 2024-06-15 MED ORDER — SODIUM CHLORIDE 0.9 % IV BOLUS
1000.0000 mL | Freq: Once | INTRAVENOUS | Status: AC
  Administered 2024-06-15: 1000 mL via INTRAVENOUS

## 2024-06-15 MED ORDER — GADOBUTROL 1 MMOL/ML IV SOLN
5.0000 mL | Freq: Once | INTRAVENOUS | Status: AC | PRN
  Administered 2024-06-15: 5 mL via INTRAVENOUS

## 2024-06-15 NOTE — ED Notes (Signed)
 Patient transported to MRI

## 2024-06-15 NOTE — ED Triage Notes (Signed)
 Pt arrived via [facility] from home after family called for help due to Pts increasing weakness and reported AMS that family began noticing this morning. Pt disoriented to time.

## 2024-06-15 NOTE — ED Notes (Signed)
 Pts sister called and updated. Pts sister will be coming to pick pt up from discharge.

## 2024-06-15 NOTE — ED Provider Notes (Signed)
 " [name] EMERG[facility]Y DEPARTMENT AT [facility] Provider Note   CSN: [id number] Arrival date & time: 06/15/24  1627     Patient presents with: Weakness   Philip [name] is a 69 y.o. male.   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  Per EMS family notes the patient has been weaker than usual over the past week, whereas he is typically interactive, oriented x 3 he is now minimally verbal, minimally interactive.  No report of fall, trauma, change medication, diet, activity. EMS reports no hemodynamic instability in transport.    Prior to Admission medications  Medication Sig Start Date End Date Taking? Authorizing Provider  abiraterone  acetate (ZYTIGA ) 250 MG tablet Take 4 tablets (1,000 mg total) by mouth daily. Take on an empty stomach 1 hour before or 2 hours after a meal 04/18/24   Davonna Siad, MD  amLODipine  (NORVASC ) 10 MG tablet Take 1 tablet (10 mg total) by mouth daily. 12/31/23 04/18/24  Zollie Lowers, MD  aspirin  81 MG chewable tablet Chew 1 tablet (81 mg total) by mouth daily. 12/31/23   Zollie Lowers, MD  atorvastatin  (LIPITOR) 40 MG tablet Take 1 tablet (40 mg total) by mouth daily. for cholesterol. 12/31/23   Zollie Lowers, MD  Blood Pressure Monitor DEVI Use to check BP daily at rest 03/23/24   Zollie Lowers, MD  docusate (COLACE) 50 MG/5ML liquid Take 10 mLs (100 mg total) by mouth 2 (two) times daily. 12/31/23   Zollie Lowers, MD  folic acid  (FOLVITE ) 1 MG tablet Take 1 tablet (1 mg total) by mouth daily. 12/31/23   Zollie Lowers, MD  gabapentin  (NEURONTIN ) 300 MG capsule Take 2 capsules (600 mg total) by mouth at bedtime. 12/31/23   Zollie Lowers, MD  hydrALAZINE  (APRESOLINE ) 25 MG tablet Take 3 tablets (75 mg total) by mouth 3 (three) times daily. 01/08/24 04/18/24  Zollie Lowers, MD  levETIRAcetam  (KEPPRA ) 500 MG tablet Take 1 tablet (500 mg total) by mouth 2 (two) times daily. 12/31/23    Zollie Lowers, MD  losartan  (COZAAR ) 25 MG tablet Take 1 tablet (25 mg total) by mouth daily. 12/31/23   Zollie Lowers, MD  nicotine  (NICODERM CQ  - DOSED IN MG/24 HOURS) 21 mg/24hr patch Place 1 patch (21 mg total) onto the skin daily. 12/17/23   Regalado, Belkys A, MD  oxyCODONE -acetaminophen  (PERCOCET) 7.5-325 MG tablet Take 1 tablet by mouth every 4 (four) hours as needed for severe pain (pain score 7-10). Due to metastatic cancer to spine 03/16/24   Zollie Lowers, MD  oxyCODONE -acetaminophen  (PERCOCET/ROXICET) 5-325 MG tablet Take 1 tablet by mouth every 4 (four) hours as needed. 05/17/24   [provider]  pantoprazole  (PROTONIX ) 40 MG tablet Take 1 tablet (40 mg total) by mouth daily. 12/31/23   Zollie Lowers, MD  polyethylene glycol (MIRALAX  / GLYCOLAX ) 17 g packet Take 17 g by mouth daily as needed for moderate constipation. 12/16/23   Regalado, Belkys A, MD  predniSONE  (DELTASONE ) 5 MG tablet Take 1 tablet (5 mg total) by mouth daily with breakfast. 04/18/24   Davonna Siad, MD  thiamine  (VITAMIN B-1) 100 MG tablet Take 1 tablet (100 mg total) by mouth daily. 12/31/23   Zollie Lowers, MD  thiamine  (VITAMIN B1) 100 MG tablet Take 100 mg by mouth daily. 04/27/24   [provider]  tiZANidine  (ZANAFLEX ) 4 MG tablet Take 4 mg by mouth every 6 (six) hours as needed. 04/05/24  [provider]  topiramate  (TOPAMAX ) 50 MG tablet Take 1 tablet (50 mg total) by mouth at bedtime. 04/04/24   Zollie Lowers, MD  Vitamin D , Ergocalciferol , (DRISDOL ) 1.25 MG (50000 UNIT) CAPS capsule Take 1 capsule (50,000 Units total) by mouth every 7 (seven) days. 01/11/24   Zollie Lowers, MD    Allergies: Patient has no known allergies.    Review of Systems  Updated Vital Signs BP (!) 142/90   Pulse 65   Temp 98.8 F (37.1 C) (Oral)   Resp 13   Ht 1.702 m (5' 7)   Wt 56.1 kg   SpO2 100%   BMI 19.37 kg/m   Physical Exam Vitals and nursing note reviewed.  Constitutional:       General: He is not in acute distress.    Appearance: He is well-developed. He is ill-appearing. He is not toxic-appearing.  HENT:     Head: Normocephalic and atraumatic.  Eyes:     Conjunctiva/sclera: Conjunctivae normal.  Cardiovascular:     Rate and Rhythm: Normal rate and regular rhythm.  Pulmonary:     Effort: Pulmonary effort is normal. No respiratory distress.     Breath sounds: No stridor.  Abdominal:     General: There is no distension.  Skin:    General: Skin is warm and dry.  Neurological:     Mental Status: He is alert.     Motor: Atrophy present.  Psychiatric:        Behavior: Behavior is slowed and withdrawn.        Cognition and Memory: Memory is impaired.     (all labs ordered are listed, but only abnormal results are displayed) Labs Reviewed  RESP PANEL BY RT-PCR (RSV, FLU A&B, COVID)  RVPGX2 - Abnormal; Notable for the following components:      Result Value   Influenza A by PCR POSITIVE (*)    All other components within normal limits  LACTIC ACID, PLASMA - Abnormal; Notable for the following components:   Lactic Acid, Venous 2.0 (*)    All other components within normal limits  COMPREHENSIVE METABOLIC PANEL WITH GFR - Abnormal; Notable for the following components:   CO2 19 (*)    Creatinine, Ser 1.26 (*)    AST 146 (*)    ALT 59 (*)    Alkaline Phosphatase 203 (*)    Anion gap 18 (*)    All other components within normal limits  CBC WITH DIFFERENTIAL/PLATELET - Abnormal; Notable for the following components:   Platelets 92 (*)    Lymphs Abs 0.5 (*)    All other components within normal limits  CULTURE, BLOOD (ROUTINE X 2)  CULTURE, BLOOD (ROUTINE X 2)  LACTIC ACID, PLASMA  PROTIME-INR    EKG: EKG Interpretation Date/Time:  Wednesday June 15 2024 16:44:21 EST Ventricular Rate:  80 PR Interval:  156 QRS Duration:  95 QT Interval:  389 QTC Calculation: 449 R Axis:   -25  Text Interpretation: Sinus rhythm Borderline left axis deviation  Confirmed by Garrick Charleston [phone]) on 06/15/2024 5:59:38 PM  Radiology: MR Brain W and Wo Contrast Result Date: 06/15/2024 EXAM: MRI BRAIN WITH AND WITHOUT CONTRAST 06/15/2024 06:29:48 PM TECHNIQUE: Multiplanar multisequence MRI of the head/brain was performed with and without the administration of 5 mL gadobutrol  (GADAVIST ) 1 MMOL/ML intravenous contrast. COMPARISON: CT head dated 06/15/2024. CLINICAL HISTORY: Neuro deficit, acute, stroke suspected. FINDINGS: BRAIN AND VENTRICLES: T2 and FLAIR hyperintensity in the periventricular and subcortical white matter compatible with  chronic microvascular ischemic changes. There is moderate generalized parenchymal volume loss. 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 Additional remote infarcts in the central and left dorsal pons. Chronic microhemorrhages in the bilateral thalami and brainstem likely related to hypertension. No acute infarct. No acute intracranial hemorrhage. No mass effect or midline shift. No hydrocephalus. The sella is unremarkable. Normal flow voids. No abnormal intracranial enhancement. ORBITS: No significant abnormality. SINUSES: Scattered mucosal thickening in the paranasal sinuses most pronounced in the ethmoid sinuses. There is a 1.1 cm cystic focus in the nasopharynx right of midline which may reflect a mucous retention cyst. BONES AND SOFT TISSUES: Normal bone marrow signal. No soft tissue abnormality. IMPRESSION: 1. No acute findings. 2. Mild chronic microvascular ischemic changes. 3. 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 4. Chronic microhemorrhages in the bilateral thalami and brainstem, likely related to hypertension. 5. Moderate generalized parenchymal volume loss. Electronically signed by: Donnice Mania MD 06/15/2024 06:50 PM EST RP Workstation: [id number]   CT Head Wo Contrast Result Date: 06/15/2024 EXAM: CT HEAD WITHOUT CONTRAST 06/15/2024  05:38:08 PM TECHNIQUE: CT of the head was performed without the administration of intravenous contrast. Automated exposure control, iterative reconstruction, and/or weight based adjustment of the mA/kV was utilized to reduce the radiation dose to as low as reasonably achievable. COMPARISON: 12/10/2023 CLINICAL HISTORY: Mental status change, unknown cause. FINDINGS: BRAIN AND VENTRICLES: No acute hemorrhage. No evidence of acute infarct. Chronic infarct in left pons. Mild chronic microvascular ischemic change. Generalized volume loss. No hydrocephalus. No extra-axial collection. No mass effect or midline shift. ORBITS: No acute abnormality. SINUSES: Mucosal thickening throughout the paranasal sinuses particularly in the ethmoid sinuses. SOFT TISSUES AND SKULL: No acute soft tissue abnormality. Chronic bilateral nasal bone deformities. No skull fracture. IMPRESSION: 1. No acute intracranial abnormality. 2. 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 Electronically signed by: Donnice Mania MD 06/15/2024 05:52 PM EST RP Workstation: [id number]   DG Chest Port 1 View Result Date: 06/15/2024 EXAM: 1 VIEW XRAY OF THE CHEST 06/15/2024 05:18:00 PM COMPARISON: 12/11/2023 CLINICAL HISTORY: Questionable sepsis - evaluate for abnormality FINDINGS: LUNGS AND PLEURA: No focal pulmonary opacity. No pleural effusion. No pneumothorax. HEART AND MEDIASTINUM: No acute abnormality of the cardiac and mediastinal silhouettes. BONES AND SOFT TISSUES: No acute osseous abnormality. IMPRESSION: 1. No acute findings. Electronically signed by: Franky Crease MD 06/15/2024 05:24 PM EST RP Workstation: [id number]     Procedures   Medications Ordered in the ED  gadobutrol  (GADAVIST ) 1 MMOL/ML injection 5 mL (5 mLs Intravenous Contrast Given 06/15/24 1803)  sodium chloride  0.9 % bolus 1,000 mL (1,000 mLs Intravenous New Bag/Given 06/15/24 2145)                                     Medical Decision Making Elderly male with metastatic cancer presents with altered mental status.  Broad differential including encephalopathy, mass, metastatic disease, dehydration, bacteremia, sepsis. Initial vitals somewhat reassuring cardiac 80 sinus normal pulse ox 99% room air normal  Amount and/or Complexity of Data Reviewed Independent Historian: EMS    Details: On arrival to the room External Data Reviewed: notes. Labs: ordered. Decision-making details documented in ED Course. Radiology: ordered and independent interpretation performed. Decision-making details documented in ED Course. ECG/medicine tests: ordered and independent interpretation performed. Decision-making details documented in ED  Course.  Risk Prescription drug management. Decision regarding hospitalization. Diagnosis or treatment significantly limited by social determinants of health.   9:19 PM Patient with labs consistent with prior, though he does have mild lactic acidosis.  He is now awake, states that he wants to go home.  CT head, MRI brain, x-ray chest all without acute changes.  Patient with evidence for prior infarct, microangiopathic changes consistent with chronic hypertension.  10:55 PM Patient awake and alert.  Flu result positive.  Lactic acidosis has resolved with fluids.  Patient is oriented, amenable, and actually requesting to go home.  With no is for bacteremia, sepsis, positive flu test, patient will follow-up with primary care.      Final diagnoses:  Influenza  Weakness    ED Discharge Orders     None          Garrick Charleston, MD 06/15/24 2255  "

## 2024-06-15 NOTE — Discharge Instructions (Signed)
 Stay well-hydrated, return here for concerning changes in your condition.

## 2024-06-16 NOTE — ED Notes (Signed)
 Report was received from previous RN. Pt has been discharged. Currently waiting ride home per report. Pt sleeping comfortably. Resp even and unlabored. Side rails up. Stretcher in the lowest position. Call bell within reach.

## 2024-06-20 LAB — CULTURE, BLOOD (ROUTINE X 2)
Culture: NO GROWTH
Culture: NO GROWTH

## 2024-06-26 DEATH — deceased

## 2024-06-27 ENCOUNTER — Inpatient Hospital Stay: Admitting: Dietician

## 2024-07-01 ENCOUNTER — Other Ambulatory Visit (HOSPITAL_COMMUNITY): Payer: Self-pay

## 2024-09-16 ENCOUNTER — Inpatient Hospital Stay

## 2024-09-23 ENCOUNTER — Inpatient Hospital Stay: Admitting: Oncology

## 2024-11-07 ENCOUNTER — Other Ambulatory Visit

## 2024-11-14 ENCOUNTER — Ambulatory Visit: Admitting: Urology

## 2024-12-07 ENCOUNTER — Ambulatory Visit: Payer: Self-pay

## 2024-12-13 ENCOUNTER — Ambulatory Visit
